# Patient Record
Sex: Male | Born: 1970 | Race: White | Hispanic: No | Smoking: Former smoker
Health system: Southern US, Community
[De-identification: ages and names within clinical notes are randomized; demographics above are authoritative.]

## PROBLEM LIST (undated history)

## (undated) DIAGNOSIS — T8859XA Other complications of anesthesia, initial encounter: Secondary | ICD-10-CM

## (undated) DIAGNOSIS — K219 Gastro-esophageal reflux disease without esophagitis: Secondary | ICD-10-CM

## (undated) DIAGNOSIS — R198 Other specified symptoms and signs involving the digestive system and abdomen: Secondary | ICD-10-CM

## (undated) DIAGNOSIS — J029 Acute pharyngitis, unspecified: Secondary | ICD-10-CM

## (undated) DIAGNOSIS — K8020 Calculus of gallbladder without cholecystitis without obstruction: Secondary | ICD-10-CM

## (undated) DIAGNOSIS — K760 Fatty (change of) liver, not elsewhere classified: Secondary | ICD-10-CM

## (undated) DIAGNOSIS — J209 Acute bronchitis, unspecified: Secondary | ICD-10-CM

## (undated) DIAGNOSIS — K8000 Calculus of gallbladder with acute cholecystitis without obstruction: Secondary | ICD-10-CM

## (undated) DIAGNOSIS — M25561 Pain in right knee: Secondary | ICD-10-CM

## (undated) DIAGNOSIS — D229 Melanocytic nevi, unspecified: Secondary | ICD-10-CM

## (undated) DIAGNOSIS — I10 Essential (primary) hypertension: Secondary | ICD-10-CM

## (undated) DIAGNOSIS — R079 Chest pain, unspecified: Secondary | ICD-10-CM

## (undated) DIAGNOSIS — L989 Disorder of the skin and subcutaneous tissue, unspecified: Secondary | ICD-10-CM

## (undated) DIAGNOSIS — Z86010 Personal history of colonic polyps: Secondary | ICD-10-CM

## (undated) DIAGNOSIS — J42 Unspecified chronic bronchitis: Secondary | ICD-10-CM

## (undated) DIAGNOSIS — T4145XA Adverse effect of unspecified anesthetic, initial encounter: Secondary | ICD-10-CM

## (undated) DIAGNOSIS — R109 Unspecified abdominal pain: Secondary | ICD-10-CM

## (undated) DIAGNOSIS — R933 Abnormal findings on diagnostic imaging of other parts of digestive tract: Secondary | ICD-10-CM

## (undated) DIAGNOSIS — J45909 Unspecified asthma, uncomplicated: Secondary | ICD-10-CM

## (undated) DIAGNOSIS — S335XXA Sprain of ligaments of lumbar spine, initial encounter: Secondary | ICD-10-CM

## (undated) DIAGNOSIS — H109 Unspecified conjunctivitis: Secondary | ICD-10-CM

## (undated) DIAGNOSIS — E669 Obesity, unspecified: Secondary | ICD-10-CM

## (undated) DIAGNOSIS — K5289 Other specified noninfective gastroenteritis and colitis: Secondary | ICD-10-CM

## (undated) DIAGNOSIS — G4733 Obstructive sleep apnea (adult) (pediatric): Secondary | ICD-10-CM

## (undated) DIAGNOSIS — R112 Nausea with vomiting, unspecified: Secondary | ICD-10-CM

## (undated) DIAGNOSIS — N529 Male erectile dysfunction, unspecified: Secondary | ICD-10-CM

## (undated) DIAGNOSIS — E860 Dehydration: Secondary | ICD-10-CM

## (undated) DIAGNOSIS — A084 Viral intestinal infection, unspecified: Secondary | ICD-10-CM

## (undated) DIAGNOSIS — K529 Noninfective gastroenteritis and colitis, unspecified: Secondary | ICD-10-CM

## (undated) DIAGNOSIS — D124 Benign neoplasm of descending colon: Secondary | ICD-10-CM

## (undated) DIAGNOSIS — F911 Conduct disorder, childhood-onset type: Secondary | ICD-10-CM

## (undated) DIAGNOSIS — J40 Bronchitis, not specified as acute or chronic: Secondary | ICD-10-CM

## (undated) DIAGNOSIS — F411 Generalized anxiety disorder: Secondary | ICD-10-CM

## (undated) DIAGNOSIS — K56600 Partial intestinal obstruction, unspecified as to cause: Secondary | ICD-10-CM

## (undated) DIAGNOSIS — R609 Edema, unspecified: Secondary | ICD-10-CM

## (undated) DIAGNOSIS — G4730 Sleep apnea, unspecified: Secondary | ICD-10-CM

## (undated) DIAGNOSIS — R0789 Other chest pain: Secondary | ICD-10-CM

## (undated) DIAGNOSIS — H1033 Unspecified acute conjunctivitis, bilateral: Secondary | ICD-10-CM

## (undated) DIAGNOSIS — N179 Acute kidney failure, unspecified: Secondary | ICD-10-CM

## (undated) DIAGNOSIS — F17200 Nicotine dependence, unspecified, uncomplicated: Secondary | ICD-10-CM

## (undated) DIAGNOSIS — F909 Attention-deficit hyperactivity disorder, unspecified type: Secondary | ICD-10-CM

## (undated) DIAGNOSIS — K56609 Unspecified intestinal obstruction, unspecified as to partial versus complete obstruction: Secondary | ICD-10-CM

## (undated) DIAGNOSIS — L918 Other hypertrophic disorders of the skin: Secondary | ICD-10-CM

## (undated) DIAGNOSIS — R197 Diarrhea, unspecified: Secondary | ICD-10-CM

## (undated) HISTORY — PX: FINGER SURGERY: SHX640

## (undated) HISTORY — DX: Nicotine dependence, unspecified, uncomplicated: F17.200

## (undated) HISTORY — DX: Attention-deficit hyperactivity disorder, unspecified type: F90.9

## (undated) HISTORY — DX: Nausea with vomiting, unspecified: R11.2

## (undated) HISTORY — DX: Other chest pain: R07.89

## (undated) HISTORY — DX: Unspecified acute conjunctivitis, bilateral: H10.33

## (undated) HISTORY — DX: Viral intestinal infection, unspecified: A08.4

## (undated) HISTORY — DX: Essential (primary) hypertension: I10

## (undated) HISTORY — DX: Dehydration: E86.0

## (undated) HISTORY — DX: Acute kidney failure, unspecified: N17.9

## (undated) HISTORY — DX: Pain in right knee: M25.561

## (undated) HISTORY — DX: Abnormal findings on diagnostic imaging of other parts of digestive tract: R93.3

## (undated) HISTORY — DX: Melanocytic nevi, unspecified: D22.9

## (undated) HISTORY — DX: Personal history of colonic polyps: Z86.010

## (undated) HISTORY — DX: Gastro-esophageal reflux disease without esophagitis: K21.9

## (undated) HISTORY — DX: Other hypertrophic disorders of the skin: L91.8

## (undated) HISTORY — DX: Calculus of gallbladder without cholecystitis without obstruction: K80.20

## (undated) HISTORY — DX: Conduct disorder, childhood-onset type: F91.1

## (undated) HISTORY — DX: Fatty (change of) liver, not elsewhere classified: K76.0

## (undated) HISTORY — DX: Obstructive sleep apnea (adult) (pediatric): G47.33

## (undated) HISTORY — DX: Partial intestinal obstruction, unspecified as to cause: K56.600

## (undated) HISTORY — DX: Chest pain, unspecified: R07.9

## (undated) HISTORY — DX: Unspecified conjunctivitis: H10.9

## (undated) HISTORY — DX: Unspecified intestinal obstruction, unspecified as to partial versus complete obstruction: K56.609

## (undated) HISTORY — DX: Other specified noninfective gastroenteritis and colitis: K52.89

## (undated) HISTORY — DX: Acute bronchitis, unspecified: J20.9

## (undated) HISTORY — DX: Benign neoplasm of descending colon: D12.4

## (undated) HISTORY — DX: Acute pharyngitis, unspecified: J02.9

## (undated) HISTORY — DX: Unspecified abdominal pain: R10.9

## (undated) HISTORY — DX: Morbid (severe) obesity due to excess calories: E66.01

## (undated) HISTORY — DX: Diarrhea, unspecified: R19.7

## (undated) HISTORY — DX: Sprain of ligaments of lumbar spine, initial encounter: S33.5XXA

## (undated) HISTORY — DX: Edema, unspecified: R60.9

## (undated) HISTORY — DX: Male erectile dysfunction, unspecified: N52.9

## (undated) HISTORY — DX: Generalized anxiety disorder: F41.1

## (undated) HISTORY — DX: Calculus of gallbladder with acute cholecystitis without obstruction: K80.00

## (undated) HISTORY — DX: Disorder of the skin and subcutaneous tissue, unspecified: L98.9

## (undated) HISTORY — DX: Bronchitis, not specified as acute or chronic: J40

## (undated) HISTORY — DX: Noninfective gastroenteritis and colitis, unspecified: K52.9

## (undated) HISTORY — DX: Other specified symptoms and signs involving the digestive system and abdomen: R19.8

---

## 1998-05-28 ENCOUNTER — Encounter: Payer: Self-pay | Admitting: Emergency Medicine

## 1998-05-28 ENCOUNTER — Emergency Department (HOSPITAL_COMMUNITY): Admission: EM | Admit: 1998-05-28 | Discharge: 1998-05-28 | Payer: Self-pay | Admitting: Emergency Medicine

## 1998-07-13 ENCOUNTER — Emergency Department (HOSPITAL_COMMUNITY): Admission: EM | Admit: 1998-07-13 | Discharge: 1998-07-13 | Payer: Self-pay | Admitting: Emergency Medicine

## 2004-01-31 ENCOUNTER — Ambulatory Visit: Payer: Self-pay | Admitting: Family Medicine

## 2004-03-02 ENCOUNTER — Ambulatory Visit: Payer: Self-pay | Admitting: Family Medicine

## 2004-06-12 ENCOUNTER — Ambulatory Visit: Payer: Self-pay | Admitting: Family Medicine

## 2004-07-21 ENCOUNTER — Ambulatory Visit: Payer: Self-pay | Admitting: Internal Medicine

## 2004-12-28 ENCOUNTER — Ambulatory Visit: Payer: Self-pay | Admitting: Family Medicine

## 2005-08-31 ENCOUNTER — Ambulatory Visit: Payer: Self-pay | Admitting: Family Medicine

## 2005-09-19 ENCOUNTER — Ambulatory Visit: Payer: Self-pay | Admitting: Gastroenterology

## 2005-10-03 DIAGNOSIS — K649 Unspecified hemorrhoids: Secondary | ICD-10-CM

## 2005-10-16 ENCOUNTER — Encounter (INDEPENDENT_AMBULATORY_CARE_PROVIDER_SITE_OTHER): Payer: Self-pay | Admitting: Specialist

## 2005-10-16 ENCOUNTER — Ambulatory Visit: Payer: Self-pay | Admitting: Gastroenterology

## 2005-10-16 HISTORY — PX: COLONOSCOPY: SHX174

## 2005-12-12 ENCOUNTER — Ambulatory Visit: Payer: Self-pay | Admitting: Family Medicine

## 2005-12-17 ENCOUNTER — Ambulatory Visit: Payer: Self-pay | Admitting: Family Medicine

## 2006-02-15 ENCOUNTER — Ambulatory Visit: Payer: Self-pay | Admitting: Family Medicine

## 2006-03-18 ENCOUNTER — Ambulatory Visit: Payer: Self-pay | Admitting: Family Medicine

## 2006-03-18 LAB — CONVERTED CEMR LAB
BUN: 13 mg/dL (ref 6–23)
CO2: 30 meq/L (ref 19–32)
Calcium: 9.5 mg/dL (ref 8.4–10.5)
Chloride: 106 meq/L (ref 96–112)
Chol/HDL Ratio, serum: 3.6
Cholesterol: 169 mg/dL (ref 0–200)
Creatinine, Ser: 1 mg/dL (ref 0.4–1.5)
GFR calc non Af Amer: 91 mL/min
Glomerular Filtration Rate, Af Am: 110 mL/min/{1.73_m2}
Glucose, Bld: 83 mg/dL (ref 70–99)
HDL: 46.8 mg/dL (ref >39.0)
LDL Cholesterol: 105 mg/dL — ABNORMAL HIGH (ref 0–99)
Potassium: 4 meq/L (ref 3.5–5.1)
Sodium: 141 meq/L (ref 135–145)
Triglyceride fasting, serum: 87 mg/dL (ref 0–149)
VLDL: 17 mg/dL (ref 0–40)

## 2006-04-29 ENCOUNTER — Ambulatory Visit: Payer: Self-pay | Admitting: Family Medicine

## 2006-05-01 ENCOUNTER — Ambulatory Visit: Payer: Self-pay | Admitting: Family Medicine

## 2006-07-30 ENCOUNTER — Ambulatory Visit: Payer: Self-pay | Admitting: Family Medicine

## 2006-07-30 DIAGNOSIS — G4733 Obstructive sleep apnea (adult) (pediatric): Secondary | ICD-10-CM | POA: Insufficient documentation

## 2006-07-30 DIAGNOSIS — I10 Essential (primary) hypertension: Secondary | ICD-10-CM

## 2006-07-30 HISTORY — DX: Obstructive sleep apnea (adult) (pediatric): G47.33

## 2006-07-30 HISTORY — DX: Morbid (severe) obesity due to excess calories: E66.01

## 2006-08-30 ENCOUNTER — Ambulatory Visit: Payer: Self-pay | Admitting: Pulmonary Disease

## 2006-10-02 ENCOUNTER — Ambulatory Visit (HOSPITAL_BASED_OUTPATIENT_CLINIC_OR_DEPARTMENT_OTHER): Admission: RE | Admit: 2006-10-02 | Discharge: 2006-10-02 | Payer: Self-pay | Admitting: Pulmonary Disease

## 2006-10-16 ENCOUNTER — Ambulatory Visit: Payer: Self-pay | Admitting: Pulmonary Disease

## 2006-10-21 ENCOUNTER — Encounter: Payer: Self-pay | Admitting: Internal Medicine

## 2006-10-21 DIAGNOSIS — K219 Gastro-esophageal reflux disease without esophagitis: Secondary | ICD-10-CM

## 2006-10-21 DIAGNOSIS — D124 Benign neoplasm of descending colon: Secondary | ICD-10-CM

## 2006-10-21 DIAGNOSIS — F909 Attention-deficit hyperactivity disorder, unspecified type: Secondary | ICD-10-CM

## 2006-10-21 HISTORY — DX: Gastro-esophageal reflux disease without esophagitis: K21.9

## 2006-10-21 HISTORY — DX: Benign neoplasm of descending colon: D12.4

## 2006-10-21 HISTORY — DX: Attention-deficit hyperactivity disorder, unspecified type: F90.9

## 2006-10-31 ENCOUNTER — Ambulatory Visit: Payer: Self-pay | Admitting: Family Medicine

## 2006-10-31 DIAGNOSIS — F911 Conduct disorder, childhood-onset type: Secondary | ICD-10-CM

## 2006-10-31 HISTORY — DX: Conduct disorder, childhood-onset type: F91.1

## 2006-12-30 ENCOUNTER — Telehealth (INDEPENDENT_AMBULATORY_CARE_PROVIDER_SITE_OTHER): Payer: Self-pay | Admitting: *Deleted

## 2007-01-20 ENCOUNTER — Ambulatory Visit: Payer: Self-pay | Admitting: Family Medicine

## 2007-01-21 ENCOUNTER — Encounter (INDEPENDENT_AMBULATORY_CARE_PROVIDER_SITE_OTHER): Payer: Self-pay | Admitting: Internal Medicine

## 2007-01-21 ENCOUNTER — Ambulatory Visit: Payer: Self-pay | Admitting: Family Medicine

## 2007-01-31 ENCOUNTER — Emergency Department (HOSPITAL_COMMUNITY): Admission: EM | Admit: 2007-01-31 | Discharge: 2007-01-31 | Payer: Self-pay | Admitting: Emergency Medicine

## 2007-08-01 ENCOUNTER — Ambulatory Visit: Payer: Self-pay | Admitting: Family Medicine

## 2008-01-09 ENCOUNTER — Emergency Department (HOSPITAL_COMMUNITY): Admission: EM | Admit: 2008-01-09 | Discharge: 2008-01-09 | Payer: Self-pay | Admitting: Emergency Medicine

## 2008-03-31 ENCOUNTER — Ambulatory Visit: Payer: Self-pay | Admitting: Internal Medicine

## 2008-04-14 ENCOUNTER — Encounter (INDEPENDENT_AMBULATORY_CARE_PROVIDER_SITE_OTHER): Payer: Self-pay | Admitting: Internal Medicine

## 2008-05-18 ENCOUNTER — Emergency Department (HOSPITAL_COMMUNITY): Admission: EM | Admit: 2008-05-18 | Discharge: 2008-05-18 | Payer: Self-pay | Admitting: Emergency Medicine

## 2008-09-28 ENCOUNTER — Ambulatory Visit: Payer: Self-pay | Admitting: Family Medicine

## 2008-11-09 ENCOUNTER — Ambulatory Visit: Payer: Self-pay | Admitting: Family Medicine

## 2008-11-09 DIAGNOSIS — R609 Edema, unspecified: Secondary | ICD-10-CM

## 2008-11-09 HISTORY — DX: Edema, unspecified: R60.9

## 2009-01-12 ENCOUNTER — Ambulatory Visit: Payer: Self-pay | Admitting: Family Medicine

## 2009-02-09 ENCOUNTER — Ambulatory Visit: Payer: Self-pay | Admitting: Family Medicine

## 2009-03-16 ENCOUNTER — Encounter (INDEPENDENT_AMBULATORY_CARE_PROVIDER_SITE_OTHER): Payer: Self-pay | Admitting: Internal Medicine

## 2009-06-15 ENCOUNTER — Ambulatory Visit: Payer: Self-pay | Admitting: Family Medicine

## 2009-11-14 ENCOUNTER — Ambulatory Visit: Payer: Self-pay | Admitting: Family Medicine

## 2009-11-14 DIAGNOSIS — F411 Generalized anxiety disorder: Secondary | ICD-10-CM

## 2009-11-14 HISTORY — DX: Generalized anxiety disorder: F41.1

## 2009-12-15 ENCOUNTER — Ambulatory Visit: Payer: Self-pay | Admitting: Family Medicine

## 2010-01-02 ENCOUNTER — Emergency Department (HOSPITAL_COMMUNITY): Admission: EM | Admit: 2010-01-02 | Discharge: 2010-01-02 | Payer: Self-pay | Admitting: Emergency Medicine

## 2010-01-12 ENCOUNTER — Ambulatory Visit: Payer: Self-pay | Admitting: Family Medicine

## 2010-04-06 NOTE — Assessment & Plan Note (Signed)
Summary: 1 m f/u dlo   Vital Signs:  Patient profile:   40 year old male Height:      74 inches Weight:      412 pounds BMI:     53.09 Temp:     97.9 degrees F oral Pulse rate:   72 / minute Pulse rhythm:   regular BP sitting:   132 / 86  (left arm) Cuff size:   large  Vitals Entered By: Linde Gillis CMA Duncan Dull) (December 15, 2009 8:17 AM) CC: one month follow up   History of Present Illness: 40 yo here for follow up anxiety.  Last month, started Buspirone 15 mg daily and Alprazolam 0.25 mg as needed. Not taking the Alprazolam, made him too sleepy.  Does feel the Buspirone has taken the edge off.  Temper is a little better. Does not feel like his BP is climbing anymore when he is at work.  Felt it worked better at the beginning, not quite as effective now.    Irritation on his glans penis- pain after intercourse, some redness on glans of penis.  Sexually active with wife only.  No penile discharge or urinary complaints.  Never had anything like this before.   Current Medications (verified): 1)  Omeprazole 20 Mg Cpdr (Omeprazole) .... Otc One Daily 2)  Norvasc 5 Mg Tabs (Amlodipine Besylate) .... Take 1 Once Daily For Bp 3)  Hydrochlorothiazide 25 Mg Tabs (Hydrochlorothiazide) .... Take 1 Each Morning For Bp 4)  Buspirone Hcl 30 Mg Tabs (Buspirone Hcl) .Marland Kitchen.. 1 Tab By Mouth Daily. 5)  Alprazolam 0.25 Mg Tabs (Alprazolam) .Marland Kitchen.. 1 Tab By Mouth Three Times A Day As Needed Anxiety 6)  Clotrimazole 1 % Crea (Clotrimazole) .... Apply To Area Two Times A Day X 3 Weeks.  Allergies: 1)  * Penicillin 2)  * Codeine 3)  * Insect Stings  Past History:  Past Medical History: Last updated: 10/21/2006 GERD Hypertension  Past Surgical History: Last updated: 10/21/2006 colonoscopy-- polyps, ext hemms.-- 10/16/2005  Family History: Last updated: 03/31/2008 Father:  Mother:  Siblings:    DM- MI- CVA-  Prostate Cancer- Breast Cancer- Ovarian Cancer- Uterine Cancer- Colon  Cancer- Drug/ ETOH Abuse- Depression-   Social History: Last updated: 03/31/2008 Marital Status: Married Children: 1 Occupation:   Risk Factors: Smoking Status: current (10/21/2006)  Review of Systems      See HPI General:  Denies fever. GU:  Denies discharge, dysuria, urinary frequency, and urinary hesitancy. Psych:  Complains of easily angered; denies easily tearful, panic attacks, sense of great danger, suicidal thoughts/plans, thoughts of violence, unusual visions or sounds, and thoughts /plans of harming others.  Physical Exam  General:  alert, well-developed, well-nourished, and well-hydrated.  morbidly obese, NAD Normotensive Lungs:  normal respiratory effort, no intercostal retractions, no accessory muscle use, and normal breath sounds.   Heart:  distant heart sounds, normal rate, regular rhythm, and no murmur Genitalia:  circumcised and no scrotal masses.   area of erythema and scaling around base of glans.  No penile discharge of lesions.   Psych:  normally interactive and good eye contact, less anxious today.   Impression & Recommendations:  Problem # 1:  ANXIETY (ICD-300.00) Assessment Improved Will increase dose of Buspirone to 30 mg as he did have some improvement at lower dose.  Follow up as needed. His updated medication list for this problem includes:    Buspirone Hcl 30 Mg Tabs (Buspirone hcl) .Marland Kitchen... 1 tab by mouth daily.  Alprazolam 0.25 Mg Tabs (Alprazolam) .Marland Kitchen... 1 tab by mouth three times a day as needed anxiety  Problem # 2:  BALANITIS (ICD-607.1) Assessment: New Will treat with clotrimazole x 3 weeks.  Pt to follow up if no improvement after that 3 week period.  Discuss hygeine as well.  Complete Medication List: 1)  Omeprazole 20 Mg Cpdr (Omeprazole) .... Otc one daily 2)  Norvasc 5 Mg Tabs (Amlodipine besylate) .... Take 1 once daily for bp 3)  Hydrochlorothiazide 25 Mg Tabs (Hydrochlorothiazide) .... Take 1 each morning for bp 4)  Buspirone  Hcl 30 Mg Tabs (Buspirone hcl) .Marland Kitchen.. 1 tab by mouth daily. 5)  Alprazolam 0.25 Mg Tabs (Alprazolam) .Marland Kitchen.. 1 tab by mouth three times a day as needed anxiety 6)  Clotrimazole 1 % Crea (Clotrimazole) .... Apply to area two times a day x 3 weeks. Prescriptions: CLOTRIMAZOLE 1 % CREA (CLOTRIMAZOLE) apply to area two times a day x 3 weeks.  #30 g x 0   Entered and Authorized by:   Ruthe Mannan MD   Signed by:   Ruthe Mannan MD on 12/15/2009   Method used:   Electronically to        [facility] (retail)       [address]       [place], [place]  [address]       Ph: [phone]       Fax: [phone]   RxID:   [id number] BUSPIRONE HCL 30 MG TABS (BUSPIRONE HCL) 1 tab by mouth daily.  #30 x 11   Entered and Authorized by:   Ruthe Mannan MD   Signed by:   Ruthe Mannan MD on 12/15/2009   Method used:   Electronically to        [facility] (retail)       [address]       [place], [place]  [address]       Ph: [phone]       Fax: [phone]   RxID:   [id number]   Current Allergies (reviewed today): * PENICILLIN * CODEINE * INSECT STINGS

## 2010-04-06 NOTE — Letter (Signed)
Summary: Generic Letter  [place] at [facility]  [address] [place], [place] [address]   Phone: [phone]  Fax: [phone]      03/16/2009      [facility], [place] [address] [place], [place] [address]    ROOK MAUE     [address]     [place], [place]  [address]  Dear [name] [name]:  [name] [name] has been a patient at [facility] for a number of years.  Recently he has discussed with me his desire to loose weight.  He has tried short term to increase exercise and restrict caloric intake.  He has lost some weight, but regained that lost and gained more with Holidays, loss of job or family stresses.  Since becoming a father, he has verbalized the desire for weight loss, "I'm tired of being fat." He understands his increased risks due to his obesity a nd hypertension. However he has not been able to mount a continuing program.  He and his wife lack basic nutritional education and eat an "old fashioned" Southern diet or fast food. They both want to please and are anxious to learn.  I have completed the form with his weights.  If I may be of further assistance, please call.           Sincerely,   [name] Latoya Maulding FNP

## 2010-04-06 NOTE — Letter (Signed)
Summary: Generic Letter  [place] at [facility]  [address] [place], [place] [address]   Phone: [phone]  Fax: [phone]        03/16/2009   [facility] [address] [place], [place]  [address]     [name]     [address]     [place], [place]  [address]   Dear Milford Cage:  [name] [name] has been a patient in the [facility] for many years.  He has expressed the desire for bariatric surgery recently due to his increasing weight.  He is aware of his increased risks due to his hypertension and morbid obesity.  He has tried short term to increase exercise and restrict calories.  He has not been able to continue this due to many factors, as family stresses.  Since the birth of his daughter, he has verbalized the need to do something more than ever before.   Your form of previous weights has been completed and is attached.  If you need more information, please contact us.            Sincerely,   [name] FNP

## 2010-04-06 NOTE — Assessment & Plan Note (Signed)
Summary: BP IS ELEVATED/ 11:15   Vital Signs:  Patient profile:   40 year old male Height:      74 inches Weight:      412 pounds BMI:     53.09 Temp:     97.7 degrees F oral Pulse rate:   68 / minute Pulse rhythm:   regular BP sitting:   128 / 100  (left arm) Cuff size:   large  Vitals Entered By: Linde Gillis CMA Duncan Dull) (November 14, 2009 10:40 AM)  Serial Vital Signs/Assessments:  Time      Position  BP       Pulse  Resp  Temp     By                     126/90                         Ruthe Mannan MD  CC: elevated blood pressure   History of Present Illness: 40 yo here for elevated BP.  Was at work today, started thinking about all the stressors in his life, job, wife, bills, etc, felt a little sweaty and checked BP at work was 160/110.  No chest pain or SOB.  No nausea or vomiting. On HCTZ 25 mg daily and Norvasc 5 mg daily.  No palpitations or blurred vision.  Has never had anything like this before. No history of CAD.   Current Medications (verified): 1)  Omeprazole 20 Mg Cpdr (Omeprazole) .... Otc One Daily 2)  Norvasc 5 Mg Tabs (Amlodipine Besylate) .... Take 1 Once Daily For Bp 3)  Hydrochlorothiazide 25 Mg Tabs (Hydrochlorothiazide) .... Take 1 Each Morning For Bp 4)  Buspirone Hcl 15 Mg Tabs (Buspirone Hcl) .Marland Kitchen.. 1 Tab By Mouth Daily. 5)  Alprazolam 0.25 Mg Tabs (Alprazolam) .Marland Kitchen.. 1 Tab By Mouth Three Times A Day As Needed Anxiety  Allergies: 1)  * Penicillin 2)  * Codeine 3)  * Insect Stings  Past History:  Past Medical History: Last updated: 10/21/2006 GERD Hypertension  Past Surgical History: Last updated: 10/21/2006 colonoscopy-- polyps, ext hemms.-- 10/16/2005  Family History: Last updated: 03/31/2008 Father:  Mother:  Siblings:    DM- MI- CVA-  Prostate Cancer- Breast Cancer- Ovarian Cancer- Uterine Cancer- Colon Cancer- Drug/ ETOH Abuse- Depression-   Social History: Last updated: 03/31/2008 Marital Status: Married Children:  1 Occupation:   Risk Factors: Smoking Status: current (10/21/2006)  Review of Systems General:  Denies malaise. Eyes:  Denies blurring. CV:  Denies chest pain or discomfort. Resp:  Denies shortness of breath. Neuro:  Denies tremors, visual disturbances, and weakness. Psych:  Complains of anxiety and panic attacks; denies depression, easily angered, easily tearful, irritability, mental problems, suicidal thoughts/plans, thoughts of violence, unusual visions or sounds, and thoughts /plans of harming others.  Physical Exam  General:  alert, well-developed, well-nourished, and well-hydrated.  morbidly obese, NAD Normotensive Lungs:  normal respiratory effort, no intercostal retractions, no accessory muscle use, and normal breath sounds.   Heart:  distant heart sounds, normal rate, regular rhythm, and no murmur Psych:  normally interactive and good eye contact, moderately anxious   Impression & Recommendations:  Problem # 1:  HYPERTENSION, BENIGN ESSENTIAL (ICD-401.1) Assessment Unchanged Normotensive in office. EKG within normal limits. Most likely due to panic attack with worsening anxiety.  See below. His updated medication list for this problem includes:    Norvasc 5 Mg Tabs (Amlodipine besylate) .Marland Kitchen... Take 1  once daily for bp    Hydrochlorothiazide 25 Mg Tabs (Hydrochlorothiazide) .Marland Kitchen... Take 1 each morning for bp  Orders: EKG w/ Interpretation ([address])  Problem # 2:  ANXIETY (ICD-300.00) Assessment: New Will start Busprione 15 mg daily. Given prescription for Alprazolam to take as needed until Busprione takes effect.  Pt aware of addiction and sedating potential. His updated medication list for this problem includes:    Buspirone Hcl 15 Mg Tabs (Buspirone hcl) .Marland Kitchen... 1 tab by mouth daily.    Alprazolam 0.25 Mg Tabs (Alprazolam) .Marland Kitchen... 1 tab by mouth three times a day as needed anxiety  Complete Medication List: 1)  Omeprazole 20 Mg Cpdr (Omeprazole) .... Otc one daily 2)   Norvasc 5 Mg Tabs (Amlodipine besylate) .... Take 1 once daily for bp 3)  Hydrochlorothiazide 25 Mg Tabs (Hydrochlorothiazide) .... Take 1 each morning for bp 4)  Buspirone Hcl 15 Mg Tabs (Buspirone hcl) .Marland Kitchen.. 1 tab by mouth daily. 5)  Alprazolam 0.25 Mg Tabs (Alprazolam) .Marland Kitchen.. 1 tab by mouth three times a day as needed anxiety  Patient Instructions: 1)  Please come back to see me in one month. 2)  Come to see [place] or go straight to ER if you feel like your blood pressure is elevated again. Prescriptions: ALPRAZOLAM 0.25 MG TABS (ALPRAZOLAM) 1 tab by mouth three times a day as needed anxiety  #60 x 0   Entered and Authorized by:   Ruthe Mannan MD   Signed by:   Ruthe Mannan MD on 11/14/2009   Method used:   Print then Give to Patient   RxID:   [id number] BUSPIRONE HCL 15 MG TABS (BUSPIRONE HCL) 1 tab by mouth daily.  #30 x 0   Entered and Authorized by:   Ruthe Mannan MD   Signed by:   Ruthe Mannan MD on 11/14/2009   Method used:   Electronically to        [facility] (retail)       [address]       [place], [place]  [address]       Ph: [phone]       Fax: [phone]   RxID:   [id number]   Current Allergies (reviewed today): * PENICILLIN * CODEINE * INSECT STINGS

## 2010-04-06 NOTE — Assessment & Plan Note (Signed)
Summary: 4 MONTH FOLLOW UP/RBH   Vital Signs:  Patient profile:   40 year old male Height:      74 inches Weight:      413.13 pounds BMI:     53.23 Temp:     97.9 degrees F oral Pulse rate:   68 / minute Pulse rhythm:   regular BP sitting:   140 / 88  (left arm) Cuff size:   large  Vitals Entered By: Linde Gillis CMA Duncan Dull) (June 15, 2009 12:01 PM) CC: 4 month follow up   History of Present Illness: 40 yo male new to me here for follow up weight, HTN.  Obesity- weight is up 5 pounds since he was here in December.  He knows that he needs to lose weight.  Not at his heaviest yet, weighed 455 several years ago.  Lost a lot of weight after his fiance left him.  Now is married to another woman and has an infant girl at home.  Very motivated to lose weight so he can be around for his child.  Has gone to his first presurgical visit for gastric bypass.  Wants to know if he should have lap band or gastric bypass.  He has a few more papers he has to fill out before they will schedule a surgery.  HTN- mildly elevated today but says that he has not taken his BP meds today and that he just ate a BBQ chicken sandwhich.  At home and work, BPs range in 130s/80s.  He is not a diabetic.  Current Medications (verified): 1)  Omeprazole 20 Mg Cpdr (Omeprazole) .... Otc One Daily 2)  Norvasc 5 Mg Tabs (Amlodipine Besylate) .... Take 1 Once Daily For Bp 3)  Hydrochlorothiazide 25 Mg Tabs (Hydrochlorothiazide) .... Take 1 Each Morning For Bp  Allergies: 1)  * Penicillin 2)  * Codeine 3)  * Insect Stings  Past History:  Past Medical History: Last updated: 10/21/2006 GERD Hypertension  Past Surgical History: Last updated: 10/21/2006 colonoscopy-- polyps, ext hemms.-- 10/16/2005  Review of Systems      See HPI CV:  Denies chest pain or discomfort. GI:  Denies abdominal pain and change in bowel habits. Psych:  Denies anxiety and depression.  Physical Exam  General:  alert, well-developed,  well-nourished, and well-hydrated.  morbidly obese, NAD Lungs:  normal respiratory effort, no intercostal retractions, no accessory muscle use, and normal breath sounds.   Heart:  distant heart sounds, normal rate, regular rhythm, and no murmur Psych:  normally interactive and good eye contact.     Impression & Recommendations:  Problem # 1:  MORBID OBESITY (ICD-278.01) Assessment Deteriorated Time spent with patient 25 minutes, more than 50% of this time was spent discussing bariatric surgery.  He feels he wants to have gastric bypass, not lapband because he needs a "cheat proof" surgery.  He will send [place] more paperwork if necessary to get the process going.  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  Problem # 2:  HYPERTENSION, BENIGN ESSENTIAL (ICD-401.1) Assessment: Deteriorated Has not taken meds today.  Asked him to keep log at home and call me in a few weeks. His updated medication list for this problem includes:    Norvasc 5 Mg Tabs (Amlodipine besylate) .Marland Kitchen... Take 1 once daily for bp    Hydrochlorothiazide 25 Mg Tabs (Hydrochlorothiazide) .Marland Kitchen... Take 1 each morning for bp  Complete Medication List: 1)  Omeprazole 20 Mg Cpdr (Omeprazole) .... Otc one daily 2)  Norvasc 5 Mg Tabs (Amlodipine besylate) .... Take 1 once daily for bp 3)  Hydrochlorothiazide 25 Mg Tabs (Hydrochlorothiazide) .... Take 1 each morning for bp  Current Allergies (reviewed today): * PENICILLIN * CODEINE * INSECT STINGS

## 2010-04-06 NOTE — Letter (Signed)
Summary: Generic Letter  [place] at [facility]  [address] [place], [place] [address]   Phone: [phone]  Fax: [phone]    03/16/2009  [name] [name] [address] [place], [place]  [address]  Dear Mr. LUA,           Sincerely,   Vic Ripper FNP

## 2010-04-06 NOTE — Letter (Signed)
Summary: [facility] Surgery Letter of Medical Necessity  [facility] Surgery Letter of Medical Necessity   Imported By: Beau Fanny 03/16/2009 14:16:58  _____________________________________________________________________  External Attachment:    Type:   Image     Comment:   External Document
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
## 2010-04-24 ENCOUNTER — Ambulatory Visit (INDEPENDENT_AMBULATORY_CARE_PROVIDER_SITE_OTHER): Payer: BC Managed Care – PPO | Admitting: Family Medicine

## 2010-04-24 ENCOUNTER — Encounter: Payer: Self-pay | Admitting: Family Medicine

## 2010-05-02 NOTE — Letter (Signed)
Summary: Out of Work  [facility] [employer] [facility] at [facility]  [address] [place], [place] [address]   Phone: [phone]  Fax: [phone]    April 24, 2010   Employee:  DORA CLAUSS    To Whom It May Concern:   Mr. Dilone was seen in our office today and has been sick. He is cleared to return to work on April 26, 2010.  If you need additional information, please feel free to contact our office.         Sincerely,    Ruthe Mannan MD

## 2010-05-02 NOTE — Assessment & Plan Note (Signed)
Summary: FILL OUT FORM FOR WEIGHT LOSS,NOTE FOR WORK/CLE   [employer]   Vital Signs:  Patient profile:   40 year old male Height:      74 inches Weight:      429.25 pounds BMI:     55.31 Temp:     98.4 degrees F oral Pulse rate:   72 / minute Pulse rhythm:   regular BP sitting:   130 / 82  (left arm) Cuff size:   large  Vitals Entered By: Linde Gillis CMA Duncan Dull) (April 24, 2010 9:59 AM) CC: weight loss surgery   History of Present Illness: 40 yo here to fill out forms for bariatric surgery.  Has information with him from [facility] for Weight loss Surgery.  Obesity- weight is up another 5 pounds since he was las here.  He knows that he needs to lose weight.  Not at his heaviest yet, weighed 455 several years ago.  Lost a lot of weight after his fiance left him.  Now is married to another woman and has an infant girl at home.  Very motivated to lose weight so he can be around for his child.   Has tried several diets including slim fast, weight watchers, atkins, low calorie diet.  Admits to not being very physically active but feels he cannot do much until he loses a few pounds.  Has h/o HTN as well.  Was seen by nurse at work last week for sore throat, placed on Bactrim.  Feels better now.  Afebrile, no cough or SOB.  Current Medications (verified): 1)  Omeprazole 20 Mg Cpdr (Omeprazole) .... Otc One Daily 2)  Norvasc 5 Mg Tabs (Amlodipine Besylate) .... Take 1 Once Daily For Bp 3)  Hydrochlorothiazide 25 Mg Tabs (Hydrochlorothiazide) .... Take 1 Each Morning For Bp 4)  Buspirone Hcl 30 Mg Tabs (Buspirone Hcl) .Marland Kitchen.. 1 Tab By Mouth Daily. 5)  Alprazolam 0.25 Mg Tabs (Alprazolam) .Marland Kitchen.. 1 Tab By Mouth Three Times A Day As Needed Anxiety 6)  Clotrimazole 1 % Crea (Clotrimazole) .... Apply To Area Two Times A Day X 3 Weeks. 7)  Bactrim Ds 800-160 Mg Tabs (Sulfamethoxazole-Trimethoprim) .... Take One Tablet By Mouth Two Times A Day For Seven Days  Allergies: 1)  * Penicillin 2)  *  Codeine 3)  * Insect Stings  Past History:  Past Medical History: Last updated: 10/21/2006 GERD Hypertension  Past Surgical History: Last updated: 10/21/2006 colonoscopy-- polyps, ext hemms.-- 10/16/2005  Family History: Last updated: 03/31/2008 Father:  Mother:  Siblings:    DM- MI- CVA-  Prostate Cancer- Breast Cancer- Ovarian Cancer- Uterine Cancer- Colon Cancer- Drug/ ETOH Abuse- Depression-   Social History: Last updated: 03/31/2008 Marital Status: Married Children: 1 Occupation:   Risk Factors: Smoking Status: current (10/21/2006)  Review of Systems      See HPI General:  Denies fever. CV:  Denies chest pain or discomfort.  Physical Exam  General:  alert, well-developed, well-nourished, and well-hydrated.  morbidly obese, NAD  Nose:  External nasal examination shows no deformity or inflammation. Nasal mucosa are pink and moist without lesions or exudates. Mouth:  pharyngeal erythema.   Lungs:  normal respiratory effort, no intercostal retractions, no accessory muscle use, and normal breath sounds.   Heart:  distant heart sounds, normal rate, regular rhythm, and no murmur Psych:  normally interactive and good eye contact, less anxious today.   Impression & Recommendations:  Problem # 1:  MORBID OBESITY (ICD-278.01) Assessment Deteriorated patient would benefit from  weight loss surgery. information from [facility] for weight loss surgery reviewed. will send last six months of office visits, vital signs to them. discussed importance of limiting caloric intake, 1400-1600 cal per day.  Complete Medication List: 1)  Omeprazole 20 Mg Cpdr (Omeprazole) .... Otc one daily 2)  Norvasc 5 Mg Tabs (Amlodipine besylate) .... Take 1 once daily for bp 3)  Hydrochlorothiazide 25 Mg Tabs (Hydrochlorothiazide) .... Take 1 each morning for bp 4)  Buspirone Hcl 30 Mg Tabs (Buspirone hcl) .Marland Kitchen.. 1 tab by mouth daily. 5)  Alprazolam 0.25 Mg Tabs (Alprazolam) .Marland Kitchen.. 1  tab by mouth three times a day as needed anxiety 6)  Clotrimazole 1 % Crea (Clotrimazole) .... Apply to area two times a day x 3 weeks. 7)  Bactrim Ds 800-160 Mg Tabs (Sulfamethoxazole-trimethoprim) .... Take one tablet by mouth two times a day for seven days   Orders Added: 1)  Est. Patient Level IV [04540]    Current Allergies (reviewed today): * PENICILLIN * CODEINE * INSECT STINGS

## 2010-05-15 ENCOUNTER — Encounter: Payer: Self-pay | Admitting: Family Medicine

## 2010-05-15 ENCOUNTER — Ambulatory Visit (INDEPENDENT_AMBULATORY_CARE_PROVIDER_SITE_OTHER): Payer: BC Managed Care – PPO | Admitting: Family Medicine

## 2010-05-15 DIAGNOSIS — K5289 Other specified noninfective gastroenteritis and colitis: Secondary | ICD-10-CM

## 2010-05-15 HISTORY — DX: Other specified noninfective gastroenteritis and colitis: K52.89

## 2010-05-23 NOTE — Letter (Signed)
Summary: Out of Work  [employer] [facility]  [address] [place], [place] [address]   Phone: [phone]  Fax: [phone]    May 15, 2010   Employee:  KIMO BANCROFT    To Whom It May Concern:   For Medical reasons, please excuse the above named employee from work for the following dates:  March 7th, March 12th, March 13th, 2012.  If you need additional information, please feel free to contact our office.         Sincerely,    Ruthe Mannan MD

## 2010-05-23 NOTE — Assessment & Plan Note (Signed)
Summary: F/U [facility] ER ON 05/10/10  STOMACH PAIN/CLE  [employer]   Vital Signs:  Patient profile:   40 year old male Height:      74 inches Weight:      423 pounds BMI:     54.51 Temp:     98.6 degrees F oral Pulse rate:   87 / minute Pulse rhythm:   regular BP sitting:   130 / 84  (right arm) Cuff size:   large  Vitals Entered By: Linde Gillis CMA Duncan Dull) (May 15, 2010 3:10 PM) CC: ER follow up, [facility]   History of Present Illness: 40 yo here for [facility] ER follow up.  Was at work on Weds, March 7th, developped acute onset of diffuse abdominal pain, watery diarrhea and nausea.  Taken to [facility] via EMS. Notes reviewed.  CBC, CMET within normal limits. CT of abdomen neg for acute findings- gallstone.   UA neg.  GIven Zofran and Dilaudid! Sent home with diagnosis of gastroenteritis.  Since then, pain has improved. Nausea has resolved, still having some loose stools but much less frequently.  Drinking a lot of fluids. Afebrile.    Current Medications (verified): 1)  Omeprazole 20 Mg Cpdr (Omeprazole) .... Otc One Daily 2)  Norvasc 5 Mg Tabs (Amlodipine Besylate) .... Take 1 Once Daily For Bp 3)  Hydrochlorothiazide 25 Mg Tabs (Hydrochlorothiazide) .... Take 1 Each Morning For Bp 4)  Clotrimazole 1 % Crea (Clotrimazole) .... Apply To Area Two Times A Day X 3 Weeks.  Allergies: 1)  * Penicillin 2)  * Codeine 3)  * Insect Stings  Past History:  Past Medical History: Last updated: 10/21/2006 GERD Hypertension  Past Surgical History: Last updated: 10/21/2006 colonoscopy-- polyps, ext hemms.-- 10/16/2005  Family History: Last updated: 03/31/2008 Father:  Mother:  Siblings:    DM- MI- CVA-  Prostate Cancer- Breast Cancer- Ovarian Cancer- Uterine Cancer- Colon Cancer- Drug/ ETOH Abuse- Depression-   Social History: Last updated: 03/31/2008 Marital Status: Married Children: 1 Occupation:   Risk Factors: Smoking Status: current  (10/21/2006)  Review of Systems      See HPI General:  Denies fever. GI:  Complains of abdominal pain, diarrhea, and nausea; denies bloody stools, dark tarry stools, and vomiting. GU:  Denies dysuria.  Physical Exam  General:  alert, well-developed, well-nourished, and well-hydrated.  morbidly obese, NAD  Mouth:  MMM Lungs:  normal respiratory effort, no intercostal retractions, no accessory muscle use, and normal breath sounds.   Heart:  distant heart sounds, normal rate, regular rhythm, and no murmur Abdomen:  soft, non-tender, normal bowel sounds, no distention, no masses, no guarding, and no rigidity.   Psych:  normally interactive and good eye contact, less anxious today.   Impression & Recommendations:  Problem # 1:  GASTROENTERITIS WITHOUT DEHYDRATION (ICD-558.9) Assessment New Symptoms improving. Advised continuing to push fluids, continue advancing diet as tolerated. Zofran as needed if nausea returns. Pt to follow up if symptoms do not continue to improve or acutely worsen.  Complete Medication List: 1)  Omeprazole 20 Mg Cpdr (Omeprazole) .... Otc one daily 2)  Norvasc 5 Mg Tabs (Amlodipine besylate) .... Take 1 once daily for bp 3)  Hydrochlorothiazide 25 Mg Tabs (Hydrochlorothiazide) .... Take 1 each morning for bp 4)  Clotrimazole 1 % Crea (Clotrimazole) .... Apply to area two times a day x 3 weeks.  Patient Instructions: 1)  The main problem with gastroentereritis is dehydration. Drink plenty of fluids and take solids as  you feel better. If you are unable to keep anything down and/or you show signs of dehydration( dry cracked lips, lack of tears, not urinating, very sleepy) , call our office.    Orders Added: 1)  Est. Patient Level IV [84132]    Current Allergies (reviewed today): * PENICILLIN * CODEINE * INSECT STINGS

## 2010-05-30 ENCOUNTER — Emergency Department (HOSPITAL_COMMUNITY)
Admission: EM | Admit: 2010-05-30 | Discharge: 2010-05-31 | Disposition: A | Discharge status: Home / Self Care | Payer: BC Managed Care – PPO | Attending: Emergency Medicine | Admitting: Emergency Medicine

## 2010-05-30 DIAGNOSIS — J029 Acute pharyngitis, unspecified: Secondary | ICD-10-CM | POA: Insufficient documentation

## 2010-05-30 DIAGNOSIS — I10 Essential (primary) hypertension: Secondary | ICD-10-CM | POA: Insufficient documentation

## 2010-05-31 ENCOUNTER — Ambulatory Visit: Payer: BC Managed Care – PPO | Admitting: Family Medicine

## 2010-06-01 ENCOUNTER — Ambulatory Visit: Payer: BC Managed Care – PPO | Admitting: Family Medicine

## 2010-06-01 NOTE — Letter (Signed)
[facility]   Imported By: Kassie Mends 05/22/2010 10:21:27  _____________________________________________________________________  External Attachment:    Type:   Image     Comment:   External Document

## 2010-06-15 LAB — CBC
HCT: 44.9 % (ref 39.0–52.0)
Hemoglobin: 15.6 g/dL (ref 13.0–17.0)
MCV: 87.3 fL (ref 78.0–100.0)
Platelets: 153 10*3/uL (ref 150–400)
RBC: 5.14 MIL/uL (ref 4.22–5.81)
RDW: 12.5 % (ref 11.5–15.5)
WBC: 4.6 10*3/uL (ref 4.0–10.5)

## 2010-06-15 LAB — DIFFERENTIAL
Basophils Absolute: 0 10*3/uL (ref 0.0–0.1)
Basophils Relative: 0 % (ref 0–1)
Basophils Relative: 0 % (ref 0–1)
Eosinophils Absolute: 0 10*3/uL (ref 0.0–0.7)
Eosinophils Relative: 1 % (ref 0–5)
Eosinophils Relative: 1 % (ref 0–5)
Lymphocytes Relative: 28 % (ref 12–46)
Lymphs Abs: 1.3 10*3/uL (ref 0.7–4.0)
Monocytes Absolute: 0.5 10*3/uL (ref 0.1–1.0)
Monocytes Relative: 10 % (ref 3–12)

## 2010-06-15 LAB — COMPREHENSIVE METABOLIC PANEL
ALT: 47 U/L (ref 0–53)
AST: 23 U/L (ref 0–37)
AST: 27 U/L (ref 0–37)
Albumin: 3.9 g/dL (ref 3.5–5.2)
Alkaline Phosphatase: 55 U/L (ref 39–117)
Alkaline Phosphatase: 56 U/L (ref 39–117)
BUN: 15 mg/dL (ref 6–23)
CO2: 29 mEq/L (ref 19–32)
Calcium: 9 mg/dL (ref 8.4–10.5)
Chloride: 105 mEq/L (ref 96–112)
Creatinine, Ser: 1.21 mg/dL (ref 0.4–1.5)
GFR calc Af Amer: >60 mL/min (ref >60)
GFR calc Af Amer: >60 mL/min (ref >60)
GFR calc non Af Amer: >60 mL/min (ref >60)
Glucose, Bld: 91 mg/dL (ref 70–99)
Potassium: 4.4 mEq/L (ref 3.5–5.1)
Potassium: 4.5 mEq/L (ref 3.5–5.1)
Sodium: 141 mEq/L (ref 135–145)
Total Bilirubin: 0.6 mg/dL (ref 0.3–1.2)
Total Protein: 6.6 g/dL (ref 6.0–8.3)

## 2010-06-15 LAB — URINALYSIS, ROUTINE W REFLEX MICROSCOPIC
Glucose, UA: NEGATIVE mg/dL
Hgb urine dipstick: NEGATIVE
Specific Gravity, Urine: 1.025 (ref 1.005–1.030)
pH: 7 (ref 5.0–8.0)

## 2010-06-15 LAB — LIPASE, BLOOD: Lipase: 22 U/L (ref 11–59)

## 2010-07-18 NOTE — Procedures (Signed)
NAME:  [name] [name], [name] [name] NO.:  [id number]   MEDICAL RECORD NO.:  [id number]          PATIENT TYPE:  OUT   LOCATION:  SLEEP CENTER                 FACILITY:  [facility]   PHYSICIAN:  Barbaraann Share, MD,FCCPDATE OF BIRTH:  03-12-71   DATE OF STUDY:  10/02/2006                            NOCTURNAL POLYSOMNOGRAM   REFERRING PHYSICIAN:  Barbaraann Share, MD,FCCP   INDICATION FOR STUDY:  Hypersomnia with sleep apnea.   EPWORTH SLEEPINESS SCORE:  10   MEDICATIONS:   SLEEP ARCHITECTURE:  The patient had a total sleep time of 368 minutes  with no significant slow-wave sleep and decreased quantity of REM. Sleep  onset latency was normal and REM onset was probably prolonged. Sleep  efficiency was excellent at 94%.   RESPIRATORY DATA:  The patient underwent split-night protocol where he  was found to have 68 obstructive events in the first 154 minutes or  sleep. This gave him an extrapolated apnea/hypopnea index of 27 events  per hour during the first half of the night. The events were not  positional and there was moderate snoring noted throughout. By protocol,  the patient was then placed on a large ResMed Quattro mask and ultimate  titrated to a final pressure of 9 cm of water.   OXYGEN DATA:  There was 02 desaturation as low as 82% prior to CPAP  initiation.   CARDIAC DATA:  Rare fusion beat noted, but no clinically significant  arrhythmias.   MOVEMENT-PARASOMNIA:  None   IMPRESSIONS-RECOMMENDATIONS:  Split-night study reveals moderate  obstructive sleep apnea/hypopnea syndrome with an apnea/hypopnea index  of 27 events per hour and 02 desaturation as low as 82% during the first  half of the night. The patient was then placed on CPAP with a large  ResMed Quattro full face mask and titrated to a final pressure of 9 cm  of water for both obstructive events and snoring.      Barbaraann Share, MD,FCCP  Diplomate, American Board of Sleep  Medicine  Electronically  Signed     [facility]/MEDQ  D:  10/16/2006 17:11:54  T:  10/17/2006 16:10:96  Job:  [id number]

## 2010-07-18 NOTE — Assessment & Plan Note (Signed)
[facility]                             PULMONARY OFFICE NOTE   NAME:[name], [name]                     MRN:          [id number]  DATE:08/30/2006                            DOB:          01/28/1971    SLEEP MEDICINE CONSULTATION:   HISTORY OF PRESENT ILLNESS:  Patient is a 40 year old gentleman, whom I  have been asked to see for sleeping difficulties.  Patient states that  he has difficulty breathing during the night and has very restless and  disruptive sleep.  He states that he has been told that he has loud  snoring, but no one has ever mentioned pauses in the breathing during  sleep.  Patient typically goes to bed at 10 p.m. and gets up at 6 a.m.  to start his day.  He is not rested upon arising.  He awakens at least  three to four times a night and sometimes feels he cannot breathe.  He  denies any gasping arousals.  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  He does  state, however, that once he gets home, he is exhausted and will fall  asleep in the recliner often.  He has sleepiness in the afternoon and  evening, trying to watch TV or movies.  He has no difficulties with  sleepiness while driving.  Of note, the patient's weight is up about 30-  40 pounds over the last two years.   PAST MEDICAL HISTORY:  1. Significant for hypertension.  2. History of asthma.   CURRENT MEDICATIONS INCLUDE:  1. Amlodipine 5 mg daily.  2. Hydrochlorothiazide 12.5 mg daily.   Patient is allergic to PENICILLIN and CODEINE.   SOCIAL HISTORY:  He is single.  He does not have children.  He has a  history of smoking up to four to five packs per day for 15 years.  Currently, he is smoking one pack per day.   FAMILY HISTORY:  Remarkable for his mother having asthma, as well as  heart disease.  Father also had heart disease.   Mother has had breast  cancer.   REVIEW OF SYSTEMS:  As per History of Present Illness.  Also see patient  intake form, documented on the chart.   PHYSICAL EXAM:  IN GENERAL:  He is an obese white male, in no acute  distress.  Blood pressure 138/94, pulse 69, temperature 98.4, weight is 352 pounds,  O2 saturation on room air is 98%.  HEENT:  Pupils were equal, round, reactive to light and accommodation.  Extraocular muscles are intact.  Nares show deviated septum to the left  with near obstruction.  Oropharynx shows significant elongation of soft  palate and uvula with significant side wall narrowing and a small  posterior pharyngeal space.  NECK:  Large and difficult to assess for JVD.  There is no thyromegaly  or lymphadenopathy.  CHEST:  Totally clear.  CARDIAC EXAM:  Reveals regular rate and rhythm, no murmurs, rubs or  gallops.  ABDOMEN:  Soft, nontender with good bowel sounds.  GENITAL EXAM, RECTAL EXAM, BREAST EXAM:  Not done and not indicated.  LOWER EXTREMITIES:  Without edema.  Good pulses distally.  No calf  tenderness.  NEUROLOGICALLY:  Alert and oriented without evidence of motor deficits.   IMPRESSION:  Probable obstructive sleep apnea.  The patient has very  abnormal upper airway anatomy, is morbidly obese, and describes a lot of  symptoms consistent with this diagnosis.  I really think at this point  in time, that he needs to have a sleep study and the patient is  agreeable to this approach.   PLAN:  1. Assess with nocturnal polysomnogram.  2. Work on weight-loss.  3. Patient will follow up after the above.     Barbaraann Share, MD,FCCP  Electronically Signed    [facility]/Med[facility]  DD: 08/30/2006  DT: 08/30/2006  Job #: [id number]   cc:   Arta Silence, MD

## 2010-07-21 NOTE — Assessment & Plan Note (Signed)
[facility]                           GASTROENTEROLOGY OFFICE NOTE   NAME:[name] [name]                     MRN:          [id number]  DATE:09/19/2005                            DOB:          1971/01/29    REFERRING PHYSICIAN:  Dr. Everrett Coombe.   REASON FOR REFERRAL:  Dr. Jillyn Hidden asked me to evaluate [name] [name] in  consultation regarding recent rectal bleeding.   HISTORY OF PRESENT ILLNESS:  [name] [name] is a very pleasant 40 year old man  who has had intermittent bright red blood per rectum for one to two years.  He says usually this occurs around the time of straining to move his bowels.  In the past two to three months, the bleeding has been a bit more dramatic  and he describes blood actually dripping into the toilet water and turning  it quite red.  He has had no chest pain, no shortness of breath, no  lightheadedness when standing.  He had labs drawn three weeks ago showing a  normal CBC, normal comprehensive metabolic profile and normal thyroid  studies.   His moves his bowels regularly every day but does have to strain quite a bit  and feels that the amount that comes out is much less than he would normally  expect and he feels there is quite a bit left in side.  He has never tried  fiber supplements and says he eats a very low fiber diet.   REVIEW OF SYSTEMS:  Notable for recent weight gain, although in the past one  to two years he has lost 80 to 100 pounds intentionally.  NSAID abuse in the  past but none in the past several months.  This was around the time of tooth  problem.  Rest of the review of systems is essentially unrevealing and is  available on his nursing intake sheet.   PAST MEDICAL HISTORY:  1.  Morbid obesity.  2.  Hypertension.  3.  Asthma.   CURRENT MEDICATION:  Lisinopril.   ALLERGIES:  PENICILLIN and CODEINE.   SOCIAL HISTORY:  Single, no children, works as s Naval architect, smokes a pack  and a half of  cigarettes a day and drinks once a week.   FAMILY HISTORY:  Breast cancer in several relatives.  Alcoholism in his  uncle.  No colon cancer or colon polyps that he knows of in his family.   PHYSICAL EXAMINATION:  VITAL SIGNS:  Height 6 feet 2 inches, 352 pounds.  Blood pressure 138/90, pulse 58.  CONSTITUTIONAL:  Morbidly obese, otherwise well-appearing.  HEENT:  Eyes:  Extraocular movements intact.  Mouth:  Oropharynx moist, no  lesions.  NECK:  Supple.  No lymphadenopathy.  LUNGS:  Clear to auscultation bilaterally.  CARDIOVASCULAR:  Heart with regular rate and rhythm.  ABDOMEN:  Soft, nontender and nondistended with normal bowel sounds.  EXTREMITIES:  No lower extremity edema.  SKIN:  No rashes or lesions on visible extremities.  RECTAL:  Exam deferred due to plan below.  NEUROLOGIC:  Alert and oriented x3.   ASSESSMENT/PLAN:  A 40 year old man with intermittent rectal bleeding.  First, it does sound like this is hemorrhoidal bleeding and occurs around  the time of straining.  He is going to begin taking fiber supplements with  Citrucel on a daily basis and titrate up or down as needed.  I suspect that  he is able to move his bowels very easily without straining with complete  evacuation, that may reduce his rectal bleeding quite a lot.  Given his age  and lack of family history, he is at low risk for colon cancer.  He has no  anemia but I think proceeding with flexible sigmoidoscopy just to make sure  he does not have any low lesion and that he indeed does have hemorrhoidal  disease is reasonable, so we will arrange for him to have a flexible  sigmoidoscopy in the next two to three weeks.  I see no reason for any blood  tests or imaging studies prior to then.                                   Rachael Fee, MD   DPJ/MedQ  DD:  09/19/2005  DT:  09/19/2005  Job #:  [id number]   cc:   Billie D. Bean, FNP

## 2010-08-18 ENCOUNTER — Other Ambulatory Visit: Payer: Self-pay | Admitting: Family Medicine

## 2010-10-04 HISTORY — PX: LAPAROSCOPIC GASTRIC SLEEVE RESECTION: SHX5895

## 2010-10-17 ENCOUNTER — Encounter: Payer: Self-pay | Admitting: Family Medicine

## 2010-10-18 ENCOUNTER — Ambulatory Visit (INDEPENDENT_AMBULATORY_CARE_PROVIDER_SITE_OTHER): Payer: BC Managed Care – PPO | Admitting: Family Medicine

## 2010-10-18 ENCOUNTER — Encounter: Payer: Self-pay | Admitting: Family Medicine

## 2010-10-18 VITALS — BP 136/86 | HR 88 | Temp 97.7°F | Wt 398.0 lb

## 2010-10-18 DIAGNOSIS — H109 Unspecified conjunctivitis: Secondary | ICD-10-CM

## 2010-10-18 DIAGNOSIS — R5381 Other malaise: Secondary | ICD-10-CM

## 2010-10-18 DIAGNOSIS — R5383 Other fatigue: Secondary | ICD-10-CM

## 2010-10-18 HISTORY — DX: Unspecified conjunctivitis: H10.9

## 2010-10-18 LAB — CBC WITH DIFFERENTIAL/PLATELET
Eosinophils Absolute: 0.1 10*3/uL (ref 0.0–0.7)
Eosinophils Relative: 1.2 % (ref 0.0–5.0)
HCT: 26.9 % — ABNORMAL LOW (ref 39.0–52.0)
Lymphs Abs: 1.4 10*3/uL (ref 0.7–4.0)
MCHC: 33.3 g/dL (ref 30.0–36.0)
MCV: 87.2 fl (ref 78.0–100.0)
Monocytes Absolute: 0.8 10*3/uL (ref 0.1–1.0)
Neutrophils Relative %: 76.8 % (ref 43.0–77.0)
Platelets: 313 10*3/uL (ref 150.0–400.0)

## 2010-10-18 LAB — HEPATIC FUNCTION PANEL
ALT: 53 U/L (ref 0–53)
Bilirubin, Direct: 0.5 mg/dL — ABNORMAL HIGH (ref 0.0–0.3)
Total Bilirubin: 1.9 mg/dL — ABNORMAL HIGH (ref 0.3–1.2)
Total Protein: 6.8 g/dL (ref 6.0–8.3)

## 2010-10-18 LAB — BASIC METABOLIC PANEL
BUN: 12 mg/dL (ref 6–23)
Chloride: 101 mEq/L (ref 96–112)
GFR: 106.75 mL/min (ref >60.00)
Potassium: 3.8 mEq/L (ref 3.5–5.1)
Sodium: 136 mEq/L (ref 135–145)

## 2010-10-18 MED ORDER — SULFACETAMIDE SODIUM 10 % OP SOLN: 1.0000 [drp] | Freq: Four times a day (QID) | OPHTHALMIC | Status: AC

## 2010-10-18 MED ORDER — OXYCODONE HCL 10 MG PO TB12: 10.0000 mg | ORAL_TABLET | Freq: Three times a day (TID) | ORAL | Status: AC | PRN

## 2010-10-18 NOTE — Progress Notes (Signed)
SUBJECTIVE:  40 y.o. male with burning, redness, discharge and mattering in both eyes for 3 days.  No other symptoms.  No significant prior ophthalmological history. No change in visual acuity, no photophobia, no severe eye pain. Recently discharged from [facility] s/p gastric sleeve procedure last week. Procedure was complicated by blood loss per pt. He is very fatigued. No CP but does feel "winded." Can barely eat so he thinks fatigue is due to that. Has already lost 25 pounds.  OBJECTIVE:  BP 136/86  Pulse 88  Temp(Src) 97.7 F (36.5 C) (Oral)  Wt 398 lb (180.532 kg) Wt Readings from Last 3 Encounters:  10/18/10 398 lb (180.532 kg)  05/15/10 423 lb (191.872 kg)  04/24/10 429 lb 4 oz (194.707 kg)     Patient appears pale, fatigued, vitals signs are normal. Eyes: both eyes with findings of typical conjunctivitis noted; erythema and discharge. PERRLA, no foreign body noted. No periorbital cellulitis. The corneas are clear and fundi normal. Visual acuity normal. Skin:  Diffuse echymosis on abdomen, lap sites c/d/i   Patient Active Problem List  Diagnoses  . COLONIC POLYPS  . MORBID OBESITY  . ANXIETY  . ANGER  . ADHD  . HYPERTENSION, BENIGN ESSENTIAL  . HEMORRHOIDS, EXTERNAL  . GERD  . SLEEP APNEA  . DEPENDENT EDEMA  . GASTROENTERITIS WITHOUT DEHYDRATION   Past Medical History  Diagnosis Date  . GERD (gastroesophageal reflux disease)   . Hypertension    Past Surgical History  Procedure Date  . Colonoscopy 10/16/2005    Polyps, external hemorrhoids  . Laparoscopic gastric banding 10/2010   History  Substance Use Topics  . Smoking status: Not on file  . Smokeless tobacco: Not on file  . Alcohol Use: Not on file   No family history on file. Allergies  Allergen Reactions  . Codeine     REACTION: unspecified  . Penicillins     REACTION: urticaria (hives)   Current Outpatient Prescriptions on File Prior to Visit  Medication Sig Dispense Refill  .  amLODipine (NORVASC) 5 MG tablet TAKE ONE TABLET BY MOUTH EVERY DAY FOR BLOOD PRESSURE  90 tablet  0  . clotrimazole (LOTRIMIN) 1 % cream Apply to area two times a day for 3 weeks       . hydrochlorothiazide 25 MG tablet Take 25 mg by mouth every morning.        Marland Kitchen omeprazole (PRILOSEC) 20 MG capsule Take 20 mg by mouth daily.          The PMH, PSH, Social History, Family History, Medications, and allergies have been reviewed in [facility], and have been updated if relevant.   ASSESSMENT/Plan: 1. Conjunctivitis   probably bacterial  Bleph 10 drops per order. Hygiene discussed. If other family members develop same condition, may use same medication for them if they are not known to be allergic to it. Call prn.   2. Fatigue Very concerned about blood loss/anemia. Pt is very pale, fatigued. Requested records from [facility] immediately from procedure last week and labs. Order labs today stat. Orders Placed This Encounter  Procedures  . CBC w/Diff  . Hepatic function panel  . Basic Metabolic Panel (BMET)

## 2010-11-03 ENCOUNTER — Encounter: Payer: Self-pay | Admitting: Gastroenterology

## 2010-11-15 ENCOUNTER — Other Ambulatory Visit: Payer: Self-pay | Admitting: Family Medicine

## 2010-12-05 LAB — POCT I-STAT, CHEM 8
BUN: 14
Chloride: 103
Creatinine, Ser: 1.1
Sodium: 141

## 2010-12-05 LAB — CBC
Hemoglobin: 15.7
RBC: 5.27
WBC: 6.4

## 2010-12-05 LAB — DIFFERENTIAL
Basophils Relative: 0
Lymphocytes Relative: 22
Lymphs Abs: 1.4
Monocytes Absolute: 0.5
Monocytes Relative: 9
Neutro Abs: 4.4
Neutrophils Relative %: 68

## 2010-12-05 LAB — POCT CARDIAC MARKERS
CKMB, poc: <1 — ABNORMAL LOW
Myoglobin, poc: 60.4
Troponin i, poc: <0.05

## 2011-11-22 ENCOUNTER — Other Ambulatory Visit: Payer: Self-pay | Admitting: *Deleted

## 2011-11-22 MED ORDER — OMEPRAZOLE 20 MG PO CPDR: 20.0000 mg | DELAYED_RELEASE_CAPSULE | Freq: Every day | ORAL | Status: DC

## 2011-11-28 ENCOUNTER — Ambulatory Visit (INDEPENDENT_AMBULATORY_CARE_PROVIDER_SITE_OTHER): Payer: BC Managed Care – PPO | Admitting: Emergency Medicine

## 2011-11-28 DIAGNOSIS — Z0289 Encounter for other administrative examinations: Secondary | ICD-10-CM

## 2011-11-28 NOTE — Progress Notes (Signed)
  Subjective:    Patient ID: [name] [name], male    DOB: 30-Jun-1970, 41 y.o.   MRN: [id number]  HPI  Corrections physical   Review of Systems As per HPI, otherwise negative.      Objective:   Physical Exam  GEN: WDWN, NAD, Non-toxic, A & O x 3.  obese HEENT: Atraumatic, Normocephalic. Neck supple. No masses, No LAD. Ears and Nose: No external deformity. CV: RRR, No M/G/R. No JVD. No thrill. No extra heart sounds. PULM: CTA B, no wheezes, crackles, rhonchi. No retractions. No resp. distress. No accessory muscle use. ABD: S, NT, ND, +BS. No rebound. No HSM. EXTR: No c/c/e NEURO Normal gait.  PSYCH: Normally interactive. Conversant. Not depressed or anxious appearing.  Calm demeanor.        Assessment & Plan:   TB screening completed Physical forms completd  I have reviewed and agree with documentation. Robert P. Merla Riches, M.D.

## 2011-12-06 ENCOUNTER — Encounter: Payer: Self-pay | Admitting: Gastroenterology

## 2012-03-11 ENCOUNTER — Other Ambulatory Visit: Payer: Self-pay | Admitting: Family Medicine

## 2012-03-12 ENCOUNTER — Other Ambulatory Visit: Payer: Self-pay | Admitting: *Deleted

## 2012-03-12 NOTE — Telephone Encounter (Signed)
Please call pt to get more information. 

## 2012-03-12 NOTE — Telephone Encounter (Signed)
Patient request refill on phenergan 25mg . Medication is not on med list is this okay?

## 2012-03-13 NOTE — Telephone Encounter (Signed)
Left message asking patient to call back

## 2012-03-14 NOTE — Telephone Encounter (Signed)
Spoke with patient, he states he didn't request this refill, doesn't know why [facility] requested it.

## 2012-05-28 ENCOUNTER — Ambulatory Visit (INDEPENDENT_AMBULATORY_CARE_PROVIDER_SITE_OTHER): Payer: BC Managed Care – PPO | Admitting: Family Medicine

## 2012-05-28 ENCOUNTER — Encounter: Payer: Self-pay | Admitting: Family Medicine

## 2012-05-28 VITALS — BP 120/68 | Temp 97.6°F | Wt 378.0 lb

## 2012-05-28 DIAGNOSIS — L918 Other hypertrophic disorders of the skin: Secondary | ICD-10-CM

## 2012-05-28 DIAGNOSIS — D239 Other benign neoplasm of skin, unspecified: Secondary | ICD-10-CM

## 2012-05-28 DIAGNOSIS — D229 Melanocytic nevi, unspecified: Secondary | ICD-10-CM

## 2012-05-28 DIAGNOSIS — I10 Essential (primary) hypertension: Secondary | ICD-10-CM

## 2012-05-28 DIAGNOSIS — L919 Hypertrophic disorder of the skin, unspecified: Secondary | ICD-10-CM

## 2012-05-28 DIAGNOSIS — L909 Atrophic disorder of skin, unspecified: Secondary | ICD-10-CM

## 2012-05-28 HISTORY — DX: Other hypertrophic disorders of the skin: L91.8

## 2012-05-28 HISTORY — DX: Melanocytic nevi, unspecified: D22.9

## 2012-05-28 MED ORDER — AMLODIPINE BESYLATE 5 MG PO TABS: ORAL_TABLET | ORAL | Status: DC

## 2012-05-28 NOTE — Addendum Note (Signed)
Addended by: Eliezer Bottom on: 05/28/2012 12:47 PM   Modules accepted: Orders

## 2012-05-28 NOTE — Progress Notes (Signed)
  Subjective:    Patient ID: [name] [name], male    DOB: 1970/07/29, 42 y.o.   MRN: [id number]  HPI  42 yo male here for:  1.  HTN- has been well controlled since his gastric surgery.  Well controlled on amlodipine 5 mg daily. Denies any HA, blurred vision, CP or SOB.  2. ?Mole- keep growing.  Has not changed in color.  He is concerned that it is cancerous.  Patient Active Problem List  Diagnosis  . COLONIC POLYPS  . MORBID OBESITY  . ANXIETY  . ANGER  . ADHD  . HYPERTENSION, BENIGN ESSENTIAL  . HEMORRHOIDS, EXTERNAL  . GERD  . SLEEP APNEA  . DEPENDENT EDEMA  . GASTROENTERITIS WITHOUT DEHYDRATION  . Conjunctivitis  . Skin tag   Past Medical History  Diagnosis Date  . GERD (gastroesophageal reflux disease)   . Hypertension    Past Surgical History  Procedure Laterality Date  . Colonoscopy  10/16/2005    Polyps, external hemorrhoids  . Gastric restriction surgery  10/2010    sleeve- at [facility]   History  Substance Use Topics  . Smoking status: Former Games developer  . Smokeless tobacco: Not on file  . Alcohol Use: Not on file   No family history on file. Allergies  Allergen Reactions  . Codeine     REACTION: unspecified  . Penicillins     REACTION: urticaria (hives)   Current Outpatient Prescriptions on File Prior to Visit  Medication Sig Dispense Refill  . clotrimazole (LOTRIMIN) 1 % cream Apply to area two times a day for 3 weeks       . omeprazole (PRILOSEC) 20 MG capsule Take 1 capsule (20 mg total) by mouth daily.  30 capsule  11   No current facility-administered medications on file prior to visit.   The PMH, PSH, Social History, Family History, Medications, and allergies have been reviewed in [facility], and have been updated if relevant.   Review of Systems See HPI    Objective:   Physical Exam  Constitutional: He appears well-developed and well-nourished. No distress.  Cardiovascular: Normal rate, regular rhythm, normal heart sounds and intact distal  pulses.   Pulmonary/Chest: Effort normal and breath sounds normal.  Skin: Skin is warm.     Psychiatric: He has a normal mood and affect. His behavior is normal. Judgment and thought content normal.   BP 120/68  Temp(Src) 97.6 F (36.4 C)  Wt 378 lb (171.46 kg)  BMI 48.51 kg/m2 Wt Readings from Last 3 Encounters:  05/28/12 378 lb (171.46 kg)  10/18/10 398 lb (180.532 kg)  05/15/10 423 lb (191.872 kg)          Assessment & Plan:  1. HYPERTENSION, BENIGN ESSENTIAL Stable on amlodipine 5 mg daily. Rx refilled.    2. Skin tag  Skin tag snipped off using Betadine for cleansing and sterile iris scissors. Local anesthesia was used. Given size of lesion, it was sent for pathology.

## 2012-12-01 ENCOUNTER — Encounter: Payer: Self-pay | Admitting: Family Medicine

## 2012-12-01 ENCOUNTER — Ambulatory Visit (INDEPENDENT_AMBULATORY_CARE_PROVIDER_SITE_OTHER): Payer: BC Managed Care – PPO | Admitting: Family Medicine

## 2012-12-01 VITALS — BP 122/84 | HR 82 | Temp 97.6°F | Ht 74.0 in | Wt 395.0 lb

## 2012-12-01 DIAGNOSIS — M25561 Pain in right knee: Secondary | ICD-10-CM

## 2012-12-01 DIAGNOSIS — M25569 Pain in unspecified knee: Secondary | ICD-10-CM

## 2012-12-01 DIAGNOSIS — I10 Essential (primary) hypertension: Secondary | ICD-10-CM

## 2012-12-01 DIAGNOSIS — G8929 Other chronic pain: Secondary | ICD-10-CM | POA: Insufficient documentation

## 2012-12-01 HISTORY — DX: Pain in right knee: M25.561

## 2012-12-01 MED ORDER — AMLODIPINE BESYLATE 5 MG PO TABS: ORAL_TABLET | ORAL | Status: DC

## 2012-12-01 NOTE — Patient Instructions (Addendum)
Good to see you. Keep in touch. Let me know how knee is feeling. Try some Tylenol and if you have some time, ice.

## 2012-12-01 NOTE — Progress Notes (Signed)
Subjective:    Patient ID: [name] [name], male    DOB: [birth year], 42 y.o.   MRN: [id number]  HPI  42 yo male here for:  1.  HTN- has been well controlled  on amlodipine 5 mg daily. Denies any HA, blurred vision, CP or SOB.  2. Obesity- enrolled in 5 K program.  Running two miles three times weekly.  Also has a very physical job now with increased physical tests. Has regained weight but working on losing it. Wt Readings from Last 3 Encounters:  12/01/12 395 lb (179.171 kg)  05/28/12 378 lb (171.46 kg)  10/18/10 398 lb (180.532 kg)   3.  Right knee pain- thinks it's mainly from increased physical activity. Hurts most at end of day.  Mild swelling.  No erythema or warmth.   Patient Active Problem List   Diagnosis Date Noted  . Skin tag 05/28/2012  . Conjunctivitis 10/18/2010  . GASTROENTERITIS WITHOUT DEHYDRATION 05/15/2010  . ANXIETY 11/14/2009  . DEPENDENT EDEMA 11/09/2008  . ANGER 10/31/2006  . COLONIC POLYPS 10/21/2006  . ADHD 10/21/2006  . GERD 10/21/2006  . MORBID OBESITY 07/30/2006  . HYPERTENSION, BENIGN ESSENTIAL 07/30/2006  . SLEEP APNEA 07/30/2006  . HEMORRHOIDS, EXTERNAL 10/03/2005   Past Medical History  Diagnosis Date  . GERD (gastroesophageal reflux disease)   . Hypertension    Past Surgical History  Procedure Laterality Date  . Colonoscopy  10/16/2005    Polyps, external hemorrhoids  . Gastric restriction surgery  10/2010    sleeve- at [facility]   History  Substance Use Topics  . Smoking status: Former Games developer  . Smokeless tobacco: Not on file  . Alcohol Use: Not on file   No family history on file. Allergies  Allergen Reactions  . Codeine     REACTION: unspecified  . Penicillins     REACTION: urticaria (hives)   Current Outpatient Prescriptions on File Prior to Visit  Medication Sig Dispense Refill  . amLODipine (NORVASC) 5 MG tablet TAKE ONE TABLET BY MOUTH EVERY DAY FOR BLOOD PRESSURE  90 tablet  6  . omeprazole (PRILOSEC) 20 MG capsule  Take 1 capsule (20 mg total) by mouth daily.  30 capsule  11  . clotrimazole (LOTRIMIN) 1 % cream Apply to area two times a day for 3 weeks        No current facility-administered medications on file prior to visit.   The PMH, PSH, Social History, Family History, Medications, and allergies have been reviewed in [facility], and have been updated if relevant.   Review of Systems See HPI    Objective:   Physical Exam  BP 122/84  Pulse 82  Temp(Src) 97.6 F (36.4 C) (Oral)  Ht 6\' 2"  (1.88 m)  Wt 395 lb (179.171 kg)  BMI 50.69 kg/m2  SpO2 96%  Constitutional: He appears well-developed and well-nourished. No distress.  Cardiovascular: Normal rate, regular rhythm, normal heart sounds and intact distal pulses.   Pulmonary/Chest: Effort normal and breath sounds normal.  Skin: Skin is warm.   Psychiatric: He has a normal mood and affect. His behavior is normal. Judgment and thought content normal.   Right knee- FROM, neg ant drawer, ?mild effusion, no warmth    Assessment & Plan:   1. HYPERTENSION, BENIGN ESSENTIAL Stable on current dose. Rx refilled.  2. Morbid obesity Deteriorated but he is working on weight.  3. Right knee pain Probably due to weight gain and increased physical activity.  Discussed proper foot wear.  Cannot take NSAIDs  due to gastric bypass.  He will update me with symptoms. If symptom persist, check CXR.

## 2013-01-02 ENCOUNTER — Encounter: Payer: Self-pay | Admitting: Family Medicine

## 2013-01-02 ENCOUNTER — Ambulatory Visit (INDEPENDENT_AMBULATORY_CARE_PROVIDER_SITE_OTHER): Payer: BC Managed Care – PPO | Admitting: Family Medicine

## 2013-01-02 VITALS — BP 130/84 | HR 80 | Temp 98.0°F | Wt 394.2 lb

## 2013-01-02 DIAGNOSIS — M25561 Pain in right knee: Secondary | ICD-10-CM

## 2013-01-02 DIAGNOSIS — M25569 Pain in unspecified knee: Secondary | ICD-10-CM

## 2013-01-02 MED ORDER — DICLOFENAC SODIUM 1 % TD GEL: 1.0000 "application " | Freq: Three times a day (TID) | TRANSDERMAL | Status: DC

## 2013-01-02 MED ORDER — TRAMADOL HCL 50 MG PO TABS: 50.0000 mg | ORAL_TABLET | Freq: Three times a day (TID) | ORAL | Status: DC | PRN

## 2013-01-02 NOTE — Progress Notes (Signed)
  Subjective:    Patient ID: [name] [name], male    DOB: August 05, 1970, 42 y.o.   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  Has to take physical agility test - so has been training for this.   Has been running 2 miles 3x/wk, for last 3 months.  Noticing increasing R knee discomfort since he's started running. Had exercise test on Wednesday where he ran <2 miles.  Did well with run, but now noticing worsening knee pain. Endorses posterior knee, medial knee, and lateral upper knee pain.  Also endorses pain at lower posterior calf. Rest helps. Movement worsens pain. Tylenol doesn't help pain. No inciting trauma/fall.  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  No hip or back pain.  No significant L knee pain. No redness or swelling or warmth of R knee.  Next exercise test is coming up on Monday - worried about not being able to complete.  Wt Readings from Last 3 Encounters:  01/02/13 394 lb 4 oz (178.831 kg)  12/01/12 395 lb (179.171 kg)  05/28/12 378 lb (171.46 kg)  weight increasing over last few years.  H/o gastric sleeve procedure 2012.  Past Medical History  Diagnosis Date  . GERD (gastroesophageal reflux disease)   . Hypertension      Review of Systems Per HPI    Objective:   Physical Exam  Nursing note and vitals reviewed. Constitutional: He appears well-developed and well-nourished. No distress.  obese  Musculoskeletal: He exhibits no edema.  No deformity, erythema or warmth of knee.  No effusion. Mildly tender intermittently to palpation lateral and medial R knee, mildly tender at lateral collateral ligament, mildly tender at popliteal area but no fullness. Tender with full extension at right knee Neg drawer test, neg mcmurray's, neg PF grind Tender with valgus testing. No abnormal patellar mobility. Tender with squeeze test at lower leg, no pain at achilles or peroneal tendon.  Tender with palpation of  medial lower leg  No palpable cords       Assessment & Plan:

## 2013-01-02 NOTE — Assessment & Plan Note (Signed)
No localizing sxs on exam - anticipate weight and wear related knee strain vs LCL strain. Lower leg pain - ?post tib tendonitis. Recommended tramadol for pain (avoids oral NSAIDs), voltaren gel, and ice/rest over weekend. Provided with letter for upcoming physical test to excuse if persistent pain after resting weekend. If not better with treatment plan, discussed calling to schedule appt with [facility] doc for further evaluation, help r/o tibial stress fracture.  Pt agrees with plan.

## 2013-01-02 NOTE — Patient Instructions (Signed)
For pain - treat with tramadol pain medicine and use voltaren gel. Ice to leg. I think you have knee strain and tendonitis of the leg from repetitive running. If not better with above over the weekend, call us for appointment with sports medicine.

## 2013-01-05 ENCOUNTER — Telehealth: Payer: Self-pay | Admitting: *Deleted

## 2013-01-05 NOTE — Telephone Encounter (Signed)
Filled and returned to [facility].

## 2013-01-05 NOTE — Telephone Encounter (Signed)
Voltaren gel requires prior auth, paperwork placed in Dr Gutierrez's inbox.

## 2013-01-05 NOTE — Telephone Encounter (Signed)
Paperwork faxed to [employer], pending response from insurance co.

## 2013-01-14 NOTE — Telephone Encounter (Signed)
I called ins company to check the status of the prior auth, was advised med was approved from 01/09/13-01/08/14, no confirmation fax will be sent. Pharmacy notified of approval via fax, sent to scan.

## 2013-04-09 ENCOUNTER — Telehealth: Payer: Self-pay | Admitting: *Deleted

## 2013-04-09 ENCOUNTER — Encounter: Payer: Self-pay | Admitting: Internal Medicine

## 2013-04-09 ENCOUNTER — Ambulatory Visit (INDEPENDENT_AMBULATORY_CARE_PROVIDER_SITE_OTHER): Payer: BC Managed Care – PPO | Admitting: Internal Medicine

## 2013-04-09 VITALS — BP 130/80 | HR 84 | Temp 98.7°F | Resp 16 | Ht 74.0 in | Wt 396.0 lb

## 2013-04-09 DIAGNOSIS — J209 Acute bronchitis, unspecified: Secondary | ICD-10-CM

## 2013-04-09 HISTORY — DX: Acute bronchitis, unspecified: J20.9

## 2013-04-09 MED ORDER — PROMETHAZINE-DM 6.25-15 MG/5ML PO SYRP: 5.0000 mL | ORAL_SOLUTION | Freq: Four times a day (QID) | ORAL | Status: DC | PRN

## 2013-04-09 MED ORDER — AZITHROMYCIN 500 MG PO TABS: 500.0000 mg | ORAL_TABLET | Freq: Every day | ORAL | Status: DC

## 2013-04-09 NOTE — Telephone Encounter (Signed)
Patient phoned and left message on triage line at 1255 today, stating he was TOLD in visit this morning he had strep throat, his paperwork stated acute bronchitis.  Pt questioning, just exactly WHAT does he have and is the prescribed medication what he needed.  States he saw T. Ronnald Ramp, MD.  Reviewed office notes with patient and reiterated to him that MD had diagnosis for today's visit as acute bronchitis and that intended medication was what MD had ordered.  Advised pt if he was not improved/better by first of the week to call for f/u appt.

## 2013-04-09 NOTE — Patient Instructions (Signed)
Acute Bronchitis Bronchitis is inflammation of the airways that extend from the windpipe into the lungs (bronchi). The inflammation often causes mucus to develop. This leads to a cough, which is the most common symptom of bronchitis.  In acute bronchitis, the condition usually develops suddenly and goes away over time, usually in a couple weeks. Smoking, allergies, and asthma can make bronchitis worse. Repeated episodes of bronchitis may cause further lung problems.  CAUSES Acute bronchitis is most often caused by the same virus that causes a cold. The virus can spread from person to person (contagious).  SIGNS AND SYMPTOMS   Cough.   Fever.   Coughing up mucus.   Body aches.   Chest congestion.   Chills.   Shortness of breath.   Sore throat.  DIAGNOSIS  Acute bronchitis is usually diagnosed through a physical exam. Tests, such as chest X-rays, are sometimes done to rule out other conditions.  TREATMENT  Acute bronchitis usually goes away in a couple weeks. Often times, no medical treatment is necessary. Medicines are sometimes given for relief of fever or cough. Antibiotics are usually not needed but may be prescribed in certain situations. In some cases, an inhaler may be recommended to help reduce shortness of breath and control the cough. A cool mist vaporizer may also be used to help thin bronchial secretions and make it easier to clear the chest.  HOME CARE INSTRUCTIONS  Get plenty of rest.   Drink enough fluids to keep your urine clear or pale yellow (unless you have a medical condition that requires fluid restriction). Increasing fluids may help thin your secretions and will prevent dehydration.   Only take over-the-counter or prescription medicines as directed by your health care provider.   Avoid smoking and secondhand smoke. Exposure to cigarette smoke or irritating chemicals will make bronchitis worse. If you are a smoker, consider using nicotine gum or skin  patches to help control withdrawal symptoms. Quitting smoking will help your lungs heal faster.   Reduce the chances of another bout of acute bronchitis by washing your hands frequently, avoiding people with cold symptoms, and trying not to touch your hands to your mouth, nose, or eyes.   Follow up with your health care provider as directed.  SEEK MEDICAL CARE IF: Your symptoms do not improve after 1 week of treatment.  SEEK IMMEDIATE MEDICAL CARE IF:  You develop an increased fever or chills.   You have chest pain.   You have severe shortness of breath.  You have bloody sputum.   You develop dehydration.  You develop fainting.  You develop repeated vomiting.  You develop a severe headache. MAKE SURE YOU:   Understand these instructions.  Will watch your condition.  Will get help right away if you are not doing well or get worse. Document Released: 03/29/2004 Document Revised: 10/22/2012 Document Reviewed: 08/12/2012 [employer] Patient Information 2014 [employer].  

## 2013-04-09 NOTE — Assessment & Plan Note (Signed)
I will treat the infection with a zpak and will control the cough with phenergan-dm

## 2013-04-09 NOTE — Progress Notes (Signed)
   Subjective:    Patient ID: [name] [name], male    DOB: [birth year], 43 y.o.   MRN: [id number]  URI  This is a new problem. The current episode started in the past 7 days. The problem has been gradually worsening. The maximum temperature recorded prior to his arrival was 100 - 100.9 F. The fever has been present for 1 to 2 days. Associated symptoms include coughing (mild NP cough) and a sore throat. Pertinent negatives include no abdominal pain, chest pain, congestion, diarrhea, dysuria, ear pain, headaches, joint pain, joint swelling, nausea, neck pain, plugged ear sensation, rash, rhinorrhea, sinus pain, sneezing, swollen glands, vomiting or wheezing. He has tried nothing for the symptoms. The treatment provided no relief.      Review of Systems  Constitutional: Positive for fever and chills. Negative for diaphoresis, activity change, appetite change and fatigue.  HENT: Positive for sore throat. Negative for congestion, ear pain, rhinorrhea and sneezing.   Eyes: Negative.   Respiratory: Positive for cough (mild NP cough). Negative for wheezing.   Cardiovascular: Negative.  Negative for chest pain, palpitations and leg swelling.  Gastrointestinal: Negative.  Negative for nausea, vomiting, abdominal pain and diarrhea.  Endocrine: Negative.   Genitourinary: Negative.  Negative for dysuria.  Musculoskeletal: Negative.  Negative for joint pain and neck pain.  Skin: Negative.  Negative for rash.  Allergic/Immunologic: Negative.   Neurological: Negative.  Negative for headaches.  Hematological: Negative.  Negative for adenopathy. Does not bruise/bleed easily.  Psychiatric/Behavioral: Negative.        Objective:   Physical Exam  Vitals reviewed. Constitutional: He is oriented to person, place, and time. He appears well-developed and well-nourished.  Non-toxic appearance. He does not have a sickly appearance. He does not appear ill. No distress.  HENT:  Right Ear: Hearing, tympanic  membrane, external ear and ear canal normal.  Left Ear: Hearing, tympanic membrane, external ear and ear canal normal.  Mouth/Throat: Mucous membranes are normal. Mucous membranes are not pale, not dry and not cyanotic. No oral lesions. No trismus in the jaw. No uvula swelling. Posterior oropharyngeal erythema present. No oropharyngeal exudate, posterior oropharyngeal edema or tonsillar abscesses.  Eyes: Conjunctivae are normal. Right eye exhibits no discharge. Left eye exhibits no discharge. No scleral icterus.  Neck: Normal range of motion. Neck supple. No JVD present. No tracheal deviation present. No thyromegaly present.  Cardiovascular: Normal rate, regular rhythm, normal heart sounds and intact distal pulses.  Exam reveals no gallop and no friction rub.   No murmur heard. Pulmonary/Chest: Effort normal and breath sounds normal. No stridor. No respiratory distress. He has no wheezes. He has no rales. He exhibits no tenderness.  Abdominal: Soft. Bowel sounds are normal. He exhibits no distension and no mass. There is no tenderness. There is no rebound and no guarding.  Musculoskeletal: Normal range of motion. He exhibits no edema and no tenderness.  Lymphadenopathy:    He has no cervical adenopathy.  Neurological: He is oriented to person, place, and time.  Skin: Skin is warm and dry. No rash noted. He is not diaphoretic. No erythema. No pallor.          Assessment & Plan:

## 2013-04-13 ENCOUNTER — Ambulatory Visit (INDEPENDENT_AMBULATORY_CARE_PROVIDER_SITE_OTHER): Payer: BC Managed Care – PPO | Admitting: Family Medicine

## 2013-04-13 ENCOUNTER — Encounter: Payer: Self-pay | Admitting: Family Medicine

## 2013-04-13 VITALS — BP 128/72 | HR 81 | Temp 97.6°F | Wt 390.5 lb

## 2013-04-13 DIAGNOSIS — J029 Acute pharyngitis, unspecified: Secondary | ICD-10-CM

## 2013-04-13 HISTORY — DX: Acute pharyngitis, unspecified: J02.9

## 2013-04-13 NOTE — Progress Notes (Signed)
Subjective:   Patient ID: [name] [name], male    DOB: 13-Mar-1970, 43 y.o.   MRN: [id number]  Chisum Habenicht is a pleasant 43 y.o. year old male who presents to clinic today with Sore Throat and Excessive Sweating  on 04/13/2013  HPI: Was seen at [facility] clinic on 2/5- note reviewed.  Was having fevers, sore throat and recent exposure to strep throat.  No cough or wheezing.  No rapid strep test done.  Diagnosed with bronchitis and placed on zmax.  Fevers have improved.  Throat still sore.  Patient Active Problem List   Diagnosis Date Noted  . Acute pharyngitis 04/13/2013  . Acute bronchitis 04/09/2013  . Right knee pain 12/01/2012  . Skin tag 05/28/2012  . Conjunctivitis 10/18/2010  . GASTROENTERITIS WITHOUT DEHYDRATION 05/15/2010  . ANXIETY 11/14/2009  . DEPENDENT EDEMA 11/09/2008  . ANGER 10/31/2006  . COLONIC POLYPS 10/21/2006  . ADHD 10/21/2006  . GERD 10/21/2006  . MORBID OBESITY 07/30/2006  . HYPERTENSION, BENIGN ESSENTIAL 07/30/2006  . SLEEP APNEA 07/30/2006  . HEMORRHOIDS, EXTERNAL 10/03/2005   Past Medical History  Diagnosis Date  . GERD (gastroesophageal reflux disease)   . Hypertension    Past Surgical History  Procedure Laterality Date  . Colonoscopy  10/16/2005    Polyps, external hemorrhoids  . Gastric restriction surgery  10/2010    sleeve- at [facility]   History  Substance Use Topics  . Smoking status: Former Research scientist (life sciences)  . Smokeless tobacco: Never Used  . Alcohol Use: No     Comment: Occasional   No family history on file. Allergies  Allergen Reactions  . Codeine     REACTION: unspecified  . Penicillins     REACTION: urticaria (hives)   Current Outpatient Prescriptions on File Prior to Visit  Medication Sig Dispense Refill  . acetaminophen (TYLENOL) 500 MG tablet Take 500 mg by mouth every 6 (six) hours as needed for pain.      Marland Kitchen amLODipine (NORVASC) 5 MG tablet TAKE ONE TABLET BY MOUTH EVERY DAY FOR BLOOD PRESSURE  90 tablet  6  .  clotrimazole (LOTRIMIN) 1 % cream Apply to area two times a day for 3 weeks       . diclofenac sodium (VOLTAREN) 1 % GEL Apply 1 application topically 3 (three) times daily.  1 Tube  1  . omeprazole (PRILOSEC) 20 MG capsule Take 1 capsule (20 mg total) by mouth daily.  30 capsule  11  . promethazine-dextromethorphan (PROMETHAZINE-DM) 6.25-15 MG/5ML syrup Take 5 mLs by mouth 4 (four) times daily as needed for cough.  118 mL  0  . traMADol (ULTRAM) 50 MG tablet Take 1 tablet (50 mg total) by mouth every 8 (eight) hours as needed for pain.  30 tablet  0   No current facility-administered medications on file prior to visit.   The PMH, PSH, Social History, Family History, Medications, and allergies have been reviewed in [facility], and have been updated if relevant.   Review of Systems See HPI No CP No SOB NO rashes No n/v/d    Objective:    BP 128/72  Pulse 81  Temp(Src) 97.6 F (36.4 C) (Oral)  Wt 390 lb 8 oz (177.13 kg)  SpO2 98%   Physical Exam  Nursing note and vitals reviewed. Constitutional: He appears well-developed and well-nourished. No distress.  HENT:  Head: Normocephalic and atraumatic.  Right Ear: Hearing and tympanic membrane normal.  Left Ear: Hearing and tympanic membrane normal.  Mouth/Throat: Uvula is midline. Oropharyngeal  exudate and posterior oropharyngeal edema present.  Cardiovascular: Normal rate and regular rhythm.   Pulmonary/Chest: Effort normal and breath sounds normal.  Skin: Skin is warm, dry and intact. No lesion noted.          Assessment & Plan:   Acute pharyngitis Return if symptoms worsen or fail to improve.   Marland Kitchen

## 2013-04-13 NOTE — Assessment & Plan Note (Signed)
Does seem consistent with strep throat- unclear why he was diagnosed with bronchitis. Lungs clear. Was treated with Azithromycin (PCN allergic) which would cover strep pharyngitis. Continue supportive care. Call or return to clinic prn if these symptoms worsen or fail to improve as anticipated. The patient indicates understanding of these issues and agrees with the plan.

## 2013-04-13 NOTE — Patient Instructions (Signed)
Good to see you. You may have had strep throat but you have been treated with antibiotics. Continue supportive care with Ibuprofen, warm tea, salt water gargles. Follow up as needed.

## 2013-04-13 NOTE — Progress Notes (Signed)
Pre-visit discussion using our clinic review tool. No additional management support is needed unless otherwise documented below in the visit note.  

## 2013-08-05 ENCOUNTER — Ambulatory Visit (INDEPENDENT_AMBULATORY_CARE_PROVIDER_SITE_OTHER): Payer: BC Managed Care – PPO | Admitting: Internal Medicine

## 2013-08-05 ENCOUNTER — Encounter (INDEPENDENT_AMBULATORY_CARE_PROVIDER_SITE_OTHER): Payer: Self-pay

## 2013-08-05 ENCOUNTER — Telehealth: Payer: Self-pay

## 2013-08-05 ENCOUNTER — Encounter: Payer: Self-pay | Admitting: Internal Medicine

## 2013-08-05 VITALS — BP 136/96 | HR 76 | Temp 97.8°F | Wt >= 6400 oz

## 2013-08-05 DIAGNOSIS — IMO0002 Reserved for concepts with insufficient information to code with codable children: Secondary | ICD-10-CM

## 2013-08-05 DIAGNOSIS — S46911A Strain of unspecified muscle, fascia and tendon at shoulder and upper arm level, right arm, initial encounter: Secondary | ICD-10-CM

## 2013-08-05 MED ORDER — CYCLOBENZAPRINE HCL 10 MG PO TABS: 10.0000 mg | ORAL_TABLET | Freq: Three times a day (TID) | ORAL | Status: DC | PRN

## 2013-08-05 NOTE — Telephone Encounter (Signed)
2 hours ago started with achy pain in upper rt arm near shoulder and now pain still in upper rt arm but radiating into back at rt shoulder blade; hurts worse when takes deep breath or moves; pain scale now is 8 when moving. No SOB and no CP.No weakness in arm. Pt scheduled appt today at 2:30 with Webb Silversmith NP per Dr Deborra Medina; if pt condition changes or worsens prior to appt pt will got to [facility] or ED.Pt voiced understanding.

## 2013-08-05 NOTE — Progress Notes (Signed)
Pre visit review using our clinic review tool, if applicable. No additional management support is needed unless otherwise documented below in the visit note. 

## 2013-08-05 NOTE — Patient Instructions (Addendum)

## 2013-08-05 NOTE — Progress Notes (Signed)
Subjective:    Patient ID: [name] [name], male    DOB: 02-19-1971, 43 y.o.   MRN: [id number]  HPI  Pt presents to the clinic today with c/o right shoulder pain. He reports it started this morning. The pain radiates into the right chest and down his right arm to the elbow. The pain seems to be worse with taking a deep breath. He denies chest pain or shortness of breath. He reports that it feels like a pulled muscle but does not remember any specific injury to the area.  Review of Systems      Past Medical History  Diagnosis Date  . GERD (gastroesophageal reflux disease)   . Hypertension     Current Outpatient Prescriptions  Medication Sig Dispense Refill  . acetaminophen (TYLENOL) 500 MG tablet Take 500 mg by mouth every 6 (six) hours as needed for pain.      Marland Kitchen amLODipine (NORVASC) 5 MG tablet TAKE ONE TABLET BY MOUTH EVERY DAY FOR BLOOD PRESSURE  90 tablet  6  . omeprazole (PRILOSEC) 20 MG capsule Take 1 capsule (20 mg total) by mouth daily.  30 capsule  11   No current facility-administered medications for this visit.    Allergies  Allergen Reactions  . Codeine     REACTION: unspecified  . Penicillins     REACTION: urticaria (hives)    No family history on file.  History   Social History  . Marital Status: Married    Spouse Name: N/A    Number of Children: 1  . Years of Education: N/A   Occupational History  .     Social History Main Topics  . Smoking status: Former Research scientist (life sciences)  . Smokeless tobacco: Never Used  . Alcohol Use: Yes     Comment: Occasional  . Drug Use: No  . Sexual Activity: Yes   Other Topics Concern  . Not on file   Social History Narrative  . No narrative on file     Constitutional: Denies fever, malaise, fatigue, headache or abrupt weight changes.  Respiratory: Denies difficulty breathing, shortness of breath, cough or sputum production.   Cardiovascular: Denies chest pain, chest tightness, palpitations or swelling in the hands or  feet.  Gastrointestinal: Denies abdominal pain, bloating, constipation, diarrhea or blood in the stool.  Musculoskeletal: Pt reports right shoulder pain. Denies decrease in range of motion, difficulty with gait, or joint pain and swelling.    No other specific complaints in a complete review of systems (except as listed in HPI above).  Objective:   Physical Exam   BP 136/96  Pulse 76  Temp(Src) 97.8 F (36.6 C) (Oral)  Wt 401 lb 8 oz (182.119 kg)  SpO2 98% Wt Readings from Last 3 Encounters:  08/05/13 401 lb 8 oz (182.119 kg)  04/13/13 390 lb 8 oz (177.13 kg)  04/09/13 396 lb (179.624 kg)    General: Appears his stated age, obese but well developed, well nourished in NAD. Cardiovascular: Normal rate and rhythm. S1,S2 noted.  No murmur, rubs or gallops noted. No JVD or BLE edema. No carotid bruits noted. Pulmonary/Chest: Normal effort and positive vesicular breath sounds. No respiratory distress. No wheezes, rales or ronchi noted.  Abdomen: Soft and nontender. Normal bowel sounds, no bruits noted. No distention or masses noted. Liver, spleen and kidneys non palpable. Musculoskeletal: Normal internal and external rotation of the right shoulder. Pain with palpation along the shoulder blade. Muscles very tense along the shoulder blade. Strength 5/5 BUE.  BMET    Component Value Date/Time   NA 136 10/18/2010 0810   K 3.8 10/18/2010 0810   CL 101 10/18/2010 0810   CO2 28 10/18/2010 0810   GLUCOSE 95 10/18/2010 0810   GLUCOSE 83 03/18/2006 0953   BUN 12 10/18/2010 0810   CREATININE 0.9 10/18/2010 0810   CALCIUM 8.6 10/18/2010 0810   GFRNONAA >60 05/18/2008 1637   GFRAA  Value: >60        The eGFR has been calculated using the MDRD equation. This calculation has not been validated in all clinical situations. eGFR's persistently <60 mL/min signify possible Chronic Kidney Disease. 05/18/2008 1637    Lipid Panel     Component Value Date/Time   CHOL 169 03/18/2006 0953   HDL 46.8  03/18/2006 0953   VLDL 17 03/18/2006 0953   LDLCALC 105* 03/18/2006 0953    CBC    Component Value Date/Time   WBC 10.0 10/18/2010 0810   RBC 3.09* 10/18/2010 0810   HGB 8.9* 10/18/2010 0810   HCT 26.9* 10/18/2010 0810   PLT 313.0 10/18/2010 0810   MCV 87.2 10/18/2010 0810   MCHC 33.3 10/18/2010 0810   RDW 14.3 10/18/2010 0810   LYMPHSABS 1.4 10/18/2010 0810   MONOABS 0.8 10/18/2010 0810   EOSABS 0.1 10/18/2010 0810   BASOSABS 0.0 10/18/2010 0810    Hgb A1C No results found for this basename: HGBA1C        Assessment & Plan:   Muscle strain of right shoulder area:  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

## 2013-10-26 ENCOUNTER — Encounter: Payer: Self-pay | Admitting: Internal Medicine

## 2013-10-26 ENCOUNTER — Ambulatory Visit (INDEPENDENT_AMBULATORY_CARE_PROVIDER_SITE_OTHER): Payer: BC Managed Care – PPO | Admitting: Internal Medicine

## 2013-10-26 VITALS — BP 142/94 | HR 88 | Temp 98.2°F | Wt >= 6400 oz

## 2013-10-26 DIAGNOSIS — R197 Diarrhea, unspecified: Secondary | ICD-10-CM

## 2013-10-26 NOTE — Progress Notes (Signed)
Pre visit review using our clinic review tool, if applicable. No additional management support is needed unless otherwise documented below in the visit note. 

## 2013-10-26 NOTE — Patient Instructions (Addendum)

## 2013-10-26 NOTE — Progress Notes (Signed)
Subjective:    Patient ID: [name] [name], male    DOB: [birth year], 43 y.o.   MRN: [id number]  HPI  Pt presents to the clinic today with c/o diarrhea. He reports this started 4 days ago. The stool is loose, but normal in color. He has noticed blood in his stool, but reports this is a chronic issue for him. He does have internal hemorrhoids that bleed sometimes. He denies abdominal pain or rectal pain. He has not tried anything OTC.  Review of Systems      Past Medical History  Diagnosis Date  . GERD (gastroesophageal reflux disease)   . Hypertension     Current Outpatient Prescriptions  Medication Sig Dispense Refill  . acetaminophen (TYLENOL) 500 MG tablet Take 500 mg by mouth every 6 (six) hours as needed for pain.      Marland Kitchen amLODipine (NORVASC) 5 MG tablet TAKE ONE TABLET BY MOUTH EVERY DAY FOR BLOOD PRESSURE  90 tablet  6  . cyclobenzaprine (FLEXERIL) 10 MG tablet Take 1 tablet (10 mg total) by mouth 3 (three) times daily as needed for muscle spasms.  30 tablet  0  . omeprazole (PRILOSEC) 20 MG capsule Take 1 capsule (20 mg total) by mouth daily.  30 capsule  11   No current facility-administered medications for this visit.    Allergies  Allergen Reactions  . Codeine     REACTION: unspecified  . Penicillins     REACTION: urticaria (hives)    No family history on file.  History   Social History  . Marital Status: Married    Spouse Name: N/A    Number of Children: 1  . Years of Education: N/A   Occupational History  .     Social History Main Topics  . Smoking status: Former Research scientist (life sciences)  . Smokeless tobacco: Never Used  . Alcohol Use: Yes     Comment: Occasional  . Drug Use: No  . Sexual Activity: Yes   Other Topics Concern  . Not on file   Social History Narrative  . No narrative on file     Constitutional: Denies fever, malaise, fatigue, headache or abrupt weight changes.   Gastrointestinal: Pt reports diarrhea and blood in stool. Denies abdominal  pain, bloating, constipation.    No other specific complaints in a complete review of systems (except as listed in HPI above).  Objective:   Physical Exam  BP 142/94  Pulse 88  Temp(Src) 98.2 F (36.8 C) (Oral)  Wt 408 lb 8 oz (185.294 kg)  SpO2 98% Wt Readings from Last 3 Encounters:  10/26/13 408 lb 8 oz (185.294 kg)  08/05/13 401 lb 8 oz (182.119 kg)  04/13/13 390 lb 8 oz (177.13 kg)    General: Appears his stated age, obese but well developed, well nourished in NAD. Cardiovascular: Normal rate and rhythm. S1,S2 noted.  No murmur, rubs or gallops noted. No JVD or BLE edema. No carotid bruits noted. Pulmonary/Chest: Normal effort and positive vesicular breath sounds. No respiratory distress. No wheezes, rales or ronchi noted.  Abdomen: Soft and nontender. Normal bowel sounds, no bruits noted. No distention or masses noted. Liver, spleen and kidneys non palpable.   BMET    Component Value Date/Time   NA 136 10/18/2010 0810   K 3.8 10/18/2010 0810   CL 101 10/18/2010 0810   CO2 28 10/18/2010 0810   GLUCOSE 95 10/18/2010 0810   GLUCOSE 83 03/18/2006 0953   BUN 12 10/18/2010 0810   CREATININE  0.9 10/18/2010 0810   CALCIUM 8.6 10/18/2010 0810   GFRNONAA >60 05/18/2008 1637   GFRAA  Value: >60        The eGFR has been calculated using the MDRD equation. This calculation has not been validated in all clinical situations. eGFR's persistently <60 mL/min signify possible Chronic Kidney Disease. 05/18/2008 1637    Lipid Panel     Component Value Date/Time   CHOL 169 03/18/2006 0953   HDL 46.8 03/18/2006 0953   VLDL 17 03/18/2006 0953   LDLCALC 105* 03/18/2006 0953    CBC    Component Value Date/Time   WBC 10.0 10/18/2010 0810   RBC 3.09* 10/18/2010 0810   HGB 8.9* 10/18/2010 0810   HCT 26.9* 10/18/2010 0810   PLT 313.0 10/18/2010 0810   MCV 87.2 10/18/2010 0810   MCHC 33.3 10/18/2010 0810   RDW 14.3 10/18/2010 0810   LYMPHSABS 1.4 10/18/2010 0810   MONOABS 0.8 10/18/2010 0810   EOSABS  0.1 10/18/2010 0810   BASOSABS 0.0 10/18/2010 0810    Hgb A1C No results found for this basename: HGBA1C         Assessment & Plan:   Diarrhea:  Likely viral Encouraged him to drink plenty of fluids Advised him he could take Imodium OTC if he wanted- he declines Work note provided  Return precautions given

## 2013-11-09 ENCOUNTER — Emergency Department (HOSPITAL_COMMUNITY)
Admission: EM | Admit: 2013-11-09 | Discharge: 2013-11-09 | Disposition: A | Discharge status: Home / Self Care | Payer: BC Managed Care – PPO | Attending: Emergency Medicine | Admitting: Emergency Medicine

## 2013-11-09 ENCOUNTER — Encounter (HOSPITAL_COMMUNITY): Payer: Self-pay | Admitting: Emergency Medicine

## 2013-11-09 ENCOUNTER — Emergency Department (HOSPITAL_COMMUNITY): Payer: BC Managed Care – PPO

## 2013-11-09 DIAGNOSIS — Z87891 Personal history of nicotine dependence: Secondary | ICD-10-CM | POA: Diagnosis not present

## 2013-11-09 DIAGNOSIS — K219 Gastro-esophageal reflux disease without esophagitis: Secondary | ICD-10-CM | POA: Insufficient documentation

## 2013-11-09 DIAGNOSIS — Z79899 Other long term (current) drug therapy: Secondary | ICD-10-CM | POA: Insufficient documentation

## 2013-11-09 DIAGNOSIS — J209 Acute bronchitis, unspecified: Secondary | ICD-10-CM | POA: Diagnosis not present

## 2013-11-09 DIAGNOSIS — I10 Essential (primary) hypertension: Secondary | ICD-10-CM | POA: Diagnosis not present

## 2013-11-09 DIAGNOSIS — Z880 Allergy status to penicillin: Secondary | ICD-10-CM | POA: Diagnosis not present

## 2013-11-09 DIAGNOSIS — R0602 Shortness of breath: Secondary | ICD-10-CM | POA: Diagnosis present

## 2013-11-09 MED ORDER — PREDNISONE 20 MG PO TABS
60.0000 mg | ORAL_TABLET | Freq: Once | ORAL | Status: AC
  Administered 2013-11-09: 60 mg via ORAL
  Filled 2013-11-09: qty 3

## 2013-11-09 MED ORDER — IPRATROPIUM-ALBUTEROL 0.5-2.5 (3) MG/3ML IN SOLN
3.0000 mL | Freq: Once | RESPIRATORY_TRACT | Status: AC
  Administered 2013-11-09: 3 mL via RESPIRATORY_TRACT
  Filled 2013-11-09: qty 3

## 2013-11-09 MED ORDER — ALBUTEROL SULFATE HFA 108 (90 BASE) MCG/ACT IN AERS: 2.0000 | INHALATION_SPRAY | RESPIRATORY_TRACT | Status: DC | PRN

## 2013-11-09 MED ORDER — PREDNISONE 50 MG PO TABS: 50.0000 mg | ORAL_TABLET | Freq: Every day | ORAL | Status: DC

## 2013-11-09 NOTE — ED Notes (Addendum)
Pt c/o persistent cough, SOB and chest tightness, pt states he is been having some low grade fever at home and coughing up some bloody secretion since Saturday.

## 2013-11-09 NOTE — ED Provider Notes (Signed)
CSN: [id number]     Arrival date & time 11/09/13  0431 History   First MD Initiated Contact with Patient 11/09/13 0533     Chief Complaint  Patient presents with  . Shortness of Breath  . Cough     (Consider location/radiation/quality/duration/timing/severity/associated sxs/prior Treatment) Patient is a 43 y.o. male presenting with shortness of breath and cough. The history is provided by the patient.  Shortness of Breath Associated symptoms: cough   Cough Associated symptoms: shortness of breath   He has been having difficulty breathing for the last 4 days. There is associated cough and that tonight he noted that there were some streaks of blood in what he was coughing up. His chest is sore from coughing but he does not actually have any chest pain per se. He has not noticed any fever or chills or sweats. There's been no nausea or vomiting. He tried taking a hot shower with no benefit. He is a former smoker having quit 6 years ago.  Past Medical History  Diagnosis Date  . GERD (gastroesophageal reflux disease)   . Hypertension    Past Surgical History  Procedure Laterality Date  . Colonoscopy  10/16/2005    Polyps, external hemorrhoids  . Gastric restriction surgery  10/2010    sleeve- at [facility]   History reviewed. No pertinent family history. History  Substance Use Topics  . Smoking status: Former Research scientist (life sciences)  . Smokeless tobacco: Former Systems developer  . Alcohol Use: Yes     Comment: Occasional    Review of Systems  Respiratory: Positive for cough and shortness of breath.   All other systems reviewed and are negative.     Allergies  Codeine and Penicillins  Home Medications   Prior to Admission medications   Medication Sig Start Date End Date Taking? Authorizing Provider  acetaminophen (TYLENOL) 500 MG tablet Take 500-1,000 mg by mouth every 6 (six) hours as needed for mild pain.    Yes Historical Provider, MD  Acetaminophen-Guaifenesin (TYLENOL CHEST CONGESTION PO) Take 2  tablets by mouth daily as needed (cold).   Yes Historical Provider, MD  amLODipine (NORVASC) 5 MG tablet Take 5 mg by mouth daily.   Yes Historical Provider, MD  cyclobenzaprine (FLEXERIL) 10 MG tablet Take 1 tablet (10 mg total) by mouth 3 (three) times daily as needed for muscle spasms. 08/05/13  Yes Jearld Fenton, NP  omeprazole (PRILOSEC) 20 MG capsule Take 1 capsule (20 mg total) by mouth daily. 11/22/11  Yes Lucille Passy, MD   BP 133/92  Pulse 81  Temp(Src) 97.8 F (36.6 C) (Oral)  Resp 13  Ht 6\' 2"  (1.88 m)  Wt 409 lb (185.521 kg)  BMI 52.49 kg/m2  SpO2 98% Physical Exam  Nursing note and vitals reviewed.  Morbidly obese 43 year old male, resting comfortably and in no acute distress. Vital signs are significant for borderline hypertension. Oxygen saturation is 98%, which is normal. Head is normocephalic and atraumatic. PERRLA, EOMI. Oropharynx is clear. Neck is nontender and supple without adenopathy or JVD. Back is nontender and there is no CVA tenderness. Lungs have prolonged exhalation phase with coarse wheezes noted on forced exhalation. There are no rales or rhonchi. Chest is nontender. Heart has regular rate and rhythm without murmur. Abdomen is soft, flat, nontender without masses or hepatosplenomegaly and peristalsis is normoactive. Extremities have  trace edema, full range of motion is present. Skin is warm and dry without rash. Neurologic: Mental status is normal, cranial nerves are intact,  there are no motor or sensory deficits.  ED Course  Procedures (including critical care time) Labs Review Results for orders placed in visit on 10/18/10  CBC WITH DIFFERENTIAL      Result Value Ref Range   WBC 10.0  4.5 - 10.5 K/uL   RBC 3.09 (*) 4.22 - 5.81 Mil/uL   Hemoglobin 8.9 (*) 13.0 - 17.0 g/dL   HCT 26.9 (*) 39.0 - 52.0 %   MCV 87.2  78.0 - 100.0 fl   MCHC 33.3  30.0 - 36.0 g/dL   RDW 14.3  11.5 - 14.6 %   Platelets 313.0  150.0 - 400.0 K/uL   Neutrophils Relative  % 76.8  43.0 - 77.0 %   Lymphocytes Relative 13.9  12.0 - 46.0 %   Monocytes Relative 7.8  3.0 - 12.0 %   Eosinophils Relative 1.2  0.0 - 5.0 %   Basophils Relative 0.3  0.0 - 3.0 %   Neutro Abs 7.7  1.4 - 7.7 K/uL   Lymphs Abs 1.4  0.7 - 4.0 K/uL   Monocytes Absolute 0.8  0.1 - 1.0 K/uL   Eosinophils Absolute 0.1  0.0 - 0.7 K/uL   Basophils Absolute 0.0  0.0 - 0.1 K/uL  HEPATIC FUNCTION PANEL      Result Value Ref Range   Total Bilirubin 1.9 (*) 0.3 - 1.2 mg/dL   Bilirubin, Direct 0.5 (*) 0.0 - 0.3 mg/dL   Alkaline Phosphatase 64  39 - 117 U/L   AST 33  0 - 37 U/L   ALT 53  0 - 53 U/L   Total Protein 6.8  6.0 - 8.3 g/dL   Albumin 3.6  3.5 - 5.2 g/dL  BASIC METABOLIC PANEL      Result Value Ref Range   Sodium 136  135 - 145 mEq/L   Potassium 3.8  3.5 - 5.1 mEq/L   Chloride 101  96 - 112 mEq/L   CO2 28  19 - 32 mEq/L   Glucose, Bld 95  70 - 99 mg/dL   BUN 12  6 - 23 mg/dL   Creatinine, Ser 0.9  0.4 - 1.5 mg/dL   Calcium 8.6  8.4 - 10.5 mg/dL   GFR 106.75  >60.00 mL/min   Imaging Review Dg Chest 2 View  11/09/2013   CLINICAL DATA:  Productive cough, congestion and shortness of breath.  EXAM: CHEST - 2 VIEW  COMPARISON:  None  FINDINGS: The heart size and mediastinal contours are within normal limits. There is no evidence of pulmonary edema, consolidation, pneumothorax, nodule or pleural fluid. The visualized skeletal structures are unremarkable.  IMPRESSION: No active disease.   Electronically Signed   By: Aletta Edouard M.D.   On: 11/09/2013 07:26     EKG Interpretation   Date/Time:  Monday November 09 2013 04:53:13 EDT Ventricular Rate:  78 PR Interval:  200 QRS Duration: 117 QT Interval:  402 QTC Calculation: 458 R Axis:   -30 Text Interpretation:  Age not entered, assumed to be  43 years old for  purpose of ECG interpretation Sinus rhythm Incomplete RBBB and LAFB  Borderline low voltage, extremity leads When compared with ECG of  05/18/2008, No significant change  was found Confirmed by [facility]  MD, Jamarion [place]  ([id number]) on 11/09/2013 5:23:38 AM      MDM   Final diagnoses:  Acute bronchitis, unspecified organism    Cough and dyspnea which appear to be a respiratory infection. The stricture be obtained to rule  out pneumonia due to given an albuterol with ipratropium nebulizer treatment. Old records are reviewed and he has no relevant past visits.  Following the above-noted treatment, there is considerable improvement but he still would break into coughing paroxysms when attempting forced exhalation. He's given a second albuterol with ipratropium nebulizer treatment and dose of prednisone. He is discharged with prescription for albuterol inhaler and prednisone.  Delora Fuel, MD 83/25/49 [id number]

## 2013-11-09 NOTE — Discharge Instructions (Signed)
Acute Bronchitis Bronchitis is inflammation of the airways that extend from the windpipe into the lungs (bronchi). The inflammation often causes mucus to develop. This leads to a cough, which is the most common symptom of bronchitis.  In acute bronchitis, the condition usually develops suddenly and goes away over time, usually in a couple weeks. Smoking, allergies, and asthma can make bronchitis worse. Repeated episodes of bronchitis may cause further lung problems.  CAUSES Acute bronchitis is most often caused by the same virus that causes a cold. The virus can spread from person to person (contagious) through coughing, sneezing, and touching contaminated objects. SIGNS AND SYMPTOMS   Cough.   Fever.   Coughing up mucus.   Body aches.   Chest congestion.   Chills.   Shortness of breath.   Sore throat.  DIAGNOSIS  Acute bronchitis is usually diagnosed through a physical exam. Your health care provider will also ask you questions about your medical history. Tests, such as chest X-rays, are sometimes done to rule out other conditions.  TREATMENT  Acute bronchitis usually goes away in a couple weeks. Oftentimes, no medical treatment is necessary. Medicines are sometimes given for relief of fever or cough. Antibiotic medicines are usually not needed but may be prescribed in certain situations. In some cases, an inhaler may be recommended to help reduce shortness of breath and control the cough. A cool mist vaporizer may also be used to help thin bronchial secretions and make it easier to clear the chest.  HOME CARE INSTRUCTIONS  Get plenty of rest.   Drink enough fluids to keep your urine clear or pale yellow (unless you have a medical condition that requires fluid restriction). Increasing fluids may help thin your respiratory secretions (sputum) and reduce chest congestion, and it will prevent dehydration.   Take medicines only as directed by your health care provider.  If  you were prescribed an antibiotic medicine, finish it all even if you start to feel better.  Avoid smoking and secondhand smoke. Exposure to cigarette smoke or irritating chemicals will make bronchitis worse. If you are a smoker, consider using nicotine gum or skin patches to help control withdrawal symptoms. Quitting smoking will help your lungs heal faster.   Reduce the chances of another bout of acute bronchitis by washing your hands frequently, avoiding people with cold symptoms, and trying not to touch your hands to your mouth, nose, or eyes.   Keep all follow-up visits as directed by your health care provider.  SEEK MEDICAL CARE IF: Your symptoms do not improve after 1 week of treatment.  SEEK IMMEDIATE MEDICAL CARE IF:  You develop an increased fever or chills.   You have chest pain.   You have severe shortness of breath.  You have bloody sputum.   You develop dehydration.  You faint or repeatedly feel like you are going to pass out.  You develop repeated vomiting.  You develop a severe headache. MAKE SURE YOU:   Understand these instructions.  Will watch your condition.  Will get help right away if you are not doing well or get worse. Document Released: 03/29/2004 Document Revised: 07/06/2013 Document Reviewed: 08/12/2012 [facility] Patient Information 2015 [place], [place]. This information is not intended to replace advice given to you by your health care provider. Make sure you discuss any questions you have with your health care provider.  Prednisone tablets What is this medicine? PREDNISONE (PRED ni sone) is a corticosteroid. It is commonly used to treat inflammation of the skin,  joints, lungs, and other organs. Common conditions treated include asthma, allergies, and arthritis. It is also used for other conditions, such as blood disorders and diseases of the adrenal glands. This medicine may be used for other purposes; ask your health care provider or  pharmacist if you have questions. COMMON BRAND NAME(S): Deltasone, Predone, Sterapred, Sterapred DS What should I tell my health care provider before I take this medicine? They need to know if you have any of these conditions: -Cushing's syndrome -diabetes -glaucoma -heart disease -high blood pressure -infection (especially a virus infection such as chickenpox, cold sores, or herpes) -kidney disease -liver disease -mental illness -myasthenia gravis -osteoporosis -seizures -stomach or intestine problems -thyroid disease -an unusual or allergic reaction to lactose, prednisone, other medicines, foods, dyes, or preservatives -pregnant or trying to get pregnant -breast-feeding How should I use this medicine? Take this medicine by mouth with a glass of water. Follow the directions on the prescription label. Take this medicine with food. If you are taking this medicine once a day, take it in the morning. Do not take more medicine than you are told to take. Do not suddenly stop taking your medicine because you may develop a severe reaction. Your doctor will tell you how much medicine to take. If your doctor wants you to stop the medicine, the dose may be slowly lowered over time to avoid any side effects. Talk to your pediatrician regarding the use of this medicine in children. Special care may be needed. Overdosage: If you think you have taken too much of this medicine contact a poison control center or emergency room at once. NOTE: This medicine is only for you. Do not share this medicine with others. What if I miss a dose? If you miss a dose, take it as soon as you can. If it is almost time for your next dose, talk to your doctor or health care professional. You may need to miss a dose or take an extra dose. Do not take double or extra doses without advice. What may interact with this medicine? Do not take this medicine with any of the following medications: -metyrapone -mifepristone This  medicine may also interact with the following medications: -aminoglutethimide -amphotericin B -aspirin and aspirin-like medicines -barbiturates -certain medicines for diabetes, like glipizide or glyburide -cholestyramine -cholinesterase inhibitors -cyclosporine -digoxin -diuretics -ephedrine -male hormones, like estrogens and birth control pills -isoniazid -ketoconazole -NSAIDS, medicines for pain and inflammation, like ibuprofen or naproxen -phenytoin -rifampin -toxoids -vaccines -warfarin This list may not describe all possible interactions. Give your health care provider a list of all the medicines, herbs, non-prescription drugs, or dietary supplements you use. Also tell them if you smoke, drink alcohol, or use illegal drugs. Some items may interact with your medicine. What should I watch for while using this medicine? Visit your doctor or health care professional for regular checks on your progress. If you are taking this medicine over a prolonged period, carry an identification card with your name and address, the type and dose of your medicine, and your doctor's name and address. This medicine may increase your risk of getting an infection. Tell your doctor or health care professional if you are around anyone with measles or chickenpox, or if you develop sores or blisters that do not heal properly. If you are going to have surgery, tell your doctor or health care professional that you have taken this medicine within the last twelve months. Ask your doctor or health care professional about your diet. You may  need to lower the amount of salt you eat. This medicine may affect blood sugar levels. If you have diabetes, check with your doctor or health care professional before you change your diet or the dose of your diabetic medicine. What side effects may I notice from receiving this medicine? Side effects that you should report to your doctor or health care professional as soon as  possible: -allergic reactions like skin rash, itching or hives, swelling of the face, lips, or tongue -changes in emotions or moods -changes in vision -depressed mood -eye pain -fever or chills, cough, sore throat, pain or difficulty passing urine -increased thirst -swelling of ankles, feet Side effects that usually do not require medical attention (report to your doctor or health care professional if they continue or are bothersome): -confusion, excitement, restlessness -headache -nausea, vomiting -skin problems, acne, thin and shiny skin -trouble sleeping -weight gain This list may not describe all possible side effects. Call your doctor for medical advice about side effects. You may report side effects to FDA at 1-800-FDA-1088. Where should I keep my medicine? Keep out of the reach of children. Store at room temperature between 15 and 30 degrees C (59 and 86 degrees F). Protect from light. Keep container tightly closed. Throw away any unused medicine after the expiration date. NOTE: This sheet is a summary. It may not cover all possible information. If you have questions about this medicine, talk to your doctor, pharmacist, or health care provider.  2015, Elsevier/Gold Standard. (2010-10-05 10:57:14)  Albuterol inhalation aerosol What is this medicine? ALBUTEROL (al [place]) is a bronchodilator. It helps open up the airways in your lungs to make it easier to breathe. This medicine is used to treat and to prevent bronchospasm. This medicine may be used for other purposes; ask your health care provider or pharmacist if you have questions. COMMON BRAND NAME(S): Proair HFA, Proventil, Proventil HFA, Respirol, Ventolin, Ventolin HFA What should I tell my health care provider before I take this medicine? They need to know if you have any of the following conditions: -diabetes -heart disease or irregular heartbeat -high blood pressure -pheochromocytoma -seizures -thyroid  disease -an unusual or allergic reaction to albuterol, levalbuterol, sulfites, other medicines, foods, dyes, or preservatives -pregnant or trying to get pregnant -breast-feeding How should I use this medicine? This medicine is for inhalation through the mouth. Follow the directions on your prescription label. Take your medicine at regular intervals. Do not use more often than directed. Make sure that you are using your inhaler correctly. Ask you doctor or health care provider if you have any questions. Talk to your pediatrician regarding the use of this medicine in children. Special care may be needed. Overdosage: If you think you have taken too much of this medicine contact a poison control center or emergency room at once. NOTE: This medicine is only for you. Do not share this medicine with others. What if I miss a dose? If you miss a dose, use it as soon as you can. If it is almost time for your next dose, use only that dose. Do not use double or extra doses. What may interact with this medicine? -anti-infectives like chloroquine and pentamidine -caffeine -cisapride -diuretics -medicines for colds -medicines for depression or for emotional or psychotic conditions -medicines for weight loss including some herbal products -methadone -some antibiotics like clarithromycin, erythromycin, levofloxacin, and linezolid -some heart medicines -steroid hormones like dexamethasone, cortisone, hydrocortisone -theophylline -thyroid hormones This list may not describe all possible interactions.  Give your health care provider a list of all the medicines, herbs, non-prescription drugs, or dietary supplements you use. Also tell them if you smoke, drink alcohol, or use illegal drugs. Some items may interact with your medicine. What should I watch for while using this medicine? Tell your doctor or health care professional if your symptoms do not improve. Do not use extra albuterol. If your asthma or  bronchitis gets worse while you are using this medicine, call your doctor right away. If your mouth gets dry try chewing sugarless gum or sucking hard candy. Drink water as directed. What side effects may I notice from receiving this medicine? Side effects that you should report to your doctor or health care professional as soon as possible: -allergic reactions like skin rash, itching or hives, swelling of the face, lips, or tongue -breathing problems -chest pain -feeling faint or lightheaded, falls -high blood pressure -irregular heartbeat -fever -muscle cramps or weakness -pain, tingling, numbness in the hands or feet -vomiting Side effects that usually do not require medical attention (report to your doctor or health care professional if they continue or are bothersome): -cough -difficulty sleeping -headache -nervousness or trembling -stomach upset -stuffy or runny nose -throat irritation -unusual taste This list may not describe all possible side effects. Call your doctor for medical advice about side effects. You may report side effects to FDA at 1-800-FDA-1088. Where should I keep my medicine? Keep out of the reach of children. Store at room temperature between 15 and 30 degrees C (59 and 86 degrees F). The contents are under pressure and may burst when exposed to heat or flame. Do not freeze. This medicine does not work as well if it is too cold. Throw away any unused medicine after the expiration date. Inhalers need to be thrown away after the labeled number of puffs have been used or by the expiration date; whichever comes first. Ventolin HFA should be thrown away 12 months after removing from foil pouch. Check the instructions that come with your medicine. NOTE: This sheet is a summary. It may not cover all possible information. If you have questions about this medicine, talk to your doctor, pharmacist, or health care provider.  2015, Elsevier/Gold Standard. (2012-08-07  10:57:17)

## 2013-11-11 ENCOUNTER — Encounter: Payer: Self-pay | Admitting: Family Medicine

## 2013-11-11 ENCOUNTER — Ambulatory Visit (INDEPENDENT_AMBULATORY_CARE_PROVIDER_SITE_OTHER): Payer: BC Managed Care – PPO | Admitting: Family Medicine

## 2013-11-11 VITALS — BP 138/92 | HR 86 | Temp 98.2°F | Wt 399.5 lb

## 2013-11-11 DIAGNOSIS — J209 Acute bronchitis, unspecified: Secondary | ICD-10-CM

## 2013-11-11 NOTE — Progress Notes (Signed)
Subjective:   Patient ID: [name] [name], male    DOB: 1971-02-18, 43 y.o.   MRN: [id number]  Caleb Decock is a pleasant 43 y.o. year old male who presents to clinic today with Hospitalization Follow-up  on 11/11/2013  HPI: Was seen in ER on 11/09/13- note reviewed.  Presented to ER with productive cough - occasionally blood tinged x 4 days  Without fevers. He has noticed "hot and cold flashes and sweats."  Daughter has similar symptoms but she has a fever.  EKG and CXR unremarkable in ER.  Dg Chest 2 View  11/09/2013   CLINICAL DATA:  Productive cough, congestion and shortness of breath.  EXAM: CHEST - 2 VIEW  COMPARISON:  None  FINDINGS: The heart size and mediastinal contours are within normal limits. There is no evidence of pulmonary edema, consolidation, pneumothorax, nodule or pleural fluid. The visualized skeletal structures are unremarkable.  IMPRESSION: No active disease.   Electronically Signed   By: Aletta Edouard M.D.   On: 11/09/2013 07:26   Given nebs treatment x 2 and dose of steroid in ER and discharged home.  Feels a little better today.  Still has cough- honey is helping a little bit.  Using albuterol inhaler prn- feels it is not helping much.  Current Outpatient Prescriptions on File Prior to Visit  Medication Sig Dispense Refill  . acetaminophen (TYLENOL) 500 MG tablet Take 500-1,000 mg by mouth every 6 (six) hours as needed for mild pain.       . Acetaminophen-Guaifenesin (TYLENOL CHEST CONGESTION PO) Take 2 tablets by mouth daily as needed (cold).      Marland Kitchen albuterol (PROVENTIL HFA;VENTOLIN HFA) 108 (90 BASE) MCG/ACT inhaler Inhale 2 puffs into the lungs every 4 (four) hours as needed for wheezing or shortness of breath (or coughing).  1 Inhaler  0  . amLODipine (NORVASC) 5 MG tablet Take 5 mg by mouth daily.      . cyclobenzaprine (FLEXERIL) 10 MG tablet Take 1 tablet (10 mg total) by mouth 3 (three) times daily as needed for muscle spasms.  30 tablet  0  .  omeprazole (PRILOSEC) 20 MG capsule Take 1 capsule (20 mg total) by mouth daily.  30 capsule  11  . predniSONE (DELTASONE) 50 MG tablet Take 1 tablet (50 mg total) by mouth daily.  5 tablet  0   No current facility-administered medications on file prior to visit.    Allergies  Allergen Reactions  . Codeine     REACTION: unspecified  . Penicillins     REACTION: urticaria (hives)    Past Medical History  Diagnosis Date  . GERD (gastroesophageal reflux disease)   . Hypertension     Past Surgical History  Procedure Laterality Date  . Colonoscopy  10/16/2005    Polyps, external hemorrhoids  . Gastric restriction surgery  10/2010    sleeve- at [facility]    No family history on file.  History   Social History  . Marital Status: Married    Spouse Name: N/A    Number of Children: 1  . Years of Education: N/A   Occupational History  .     Social History Main Topics  . Smoking status: Former Research scientist (life sciences)  . Smokeless tobacco: Former Systems developer  . Alcohol Use: Yes     Comment: Occasional  . Drug Use: No  . Sexual Activity: Yes   Other Topics Concern  . Not on file   Social History Narrative  . No narrative on  file   The PMH, [place], Social History, Family History, Medications, and allergies have been reviewed in [facility], and have been updated if relevant.   Review of Systems See HPI No CP No SOB No fever No n/v/d No rashes No LE edema    Objective:    BP 138/92  Pulse 86  Temp(Src) 98.2 F (36.8 C) (Oral)  Wt 399 lb 8 oz (181.212 kg)  SpO2 98%   Physical Exam  Nursing note and vitals reviewed. Constitutional: He appears well-developed and well-nourished. No distress.  HENT:  Head: Normocephalic.  Eyes: Pupils are equal, round, and reactive to light.  Cardiovascular: Normal rate, regular rhythm and normal heart sounds.   Pulmonary/Chest: Breath sounds normal. No respiratory distress. He has no wheezes.  Skin: Skin is warm and dry.  Psychiatric: He has a normal mood and  affect. His behavior is normal. Judgment and thought content normal.          Assessment & Plan:   No diagnosis found. No Follow-up on file.

## 2013-11-11 NOTE — Assessment & Plan Note (Signed)
New- symptoms do appear to be improving. Likely viral. Finish course of prednisone, prn albuterol. Call or return to clinic prn if these symptoms worsen or fail to improve as anticipated. The patient indicates understanding of these issues and agrees with the plan.

## 2013-11-11 NOTE — Patient Instructions (Signed)
Good to see you. Finish course of prednisone.  This is likely a viral bronchitis. Call me by Friday with an update.

## 2013-11-11 NOTE — Progress Notes (Signed)
Pre visit review using our clinic review tool, if applicable. No additional management support is needed unless otherwise documented below in the visit note. 

## 2013-12-06 ENCOUNTER — Emergency Department (HOSPITAL_COMMUNITY)
Admission: EM | Admit: 2013-12-06 | Discharge: 2013-12-06 | Disposition: A | Discharge status: Home / Self Care | Payer: BC Managed Care – PPO | Attending: Emergency Medicine | Admitting: Emergency Medicine

## 2013-12-06 ENCOUNTER — Encounter (HOSPITAL_COMMUNITY): Payer: Self-pay | Admitting: Emergency Medicine

## 2013-12-06 ENCOUNTER — Emergency Department (HOSPITAL_COMMUNITY): Payer: BC Managed Care – PPO

## 2013-12-06 DIAGNOSIS — Z87891 Personal history of nicotine dependence: Secondary | ICD-10-CM | POA: Insufficient documentation

## 2013-12-06 DIAGNOSIS — Z880 Allergy status to penicillin: Secondary | ICD-10-CM | POA: Insufficient documentation

## 2013-12-06 DIAGNOSIS — K219 Gastro-esophageal reflux disease without esophagitis: Secondary | ICD-10-CM | POA: Diagnosis not present

## 2013-12-06 DIAGNOSIS — Z79899 Other long term (current) drug therapy: Secondary | ICD-10-CM | POA: Diagnosis not present

## 2013-12-06 DIAGNOSIS — I10 Essential (primary) hypertension: Secondary | ICD-10-CM | POA: Diagnosis not present

## 2013-12-06 DIAGNOSIS — J45901 Unspecified asthma with (acute) exacerbation: Secondary | ICD-10-CM | POA: Diagnosis not present

## 2013-12-06 DIAGNOSIS — J4530 Mild persistent asthma, uncomplicated: Secondary | ICD-10-CM

## 2013-12-06 DIAGNOSIS — R0981 Nasal congestion: Secondary | ICD-10-CM | POA: Diagnosis present

## 2013-12-06 MED ORDER — IPRATROPIUM-ALBUTEROL 0.5-2.5 (3) MG/3ML IN SOLN
3.0000 mL | Freq: Once | RESPIRATORY_TRACT | Status: AC
  Administered 2013-12-06: 3 mL via RESPIRATORY_TRACT
  Filled 2013-12-06: qty 3

## 2013-12-06 MED ORDER — ALBUTEROL SULFATE HFA 108 (90 BASE) MCG/ACT IN AERS: 2.0000 | INHALATION_SPRAY | RESPIRATORY_TRACT | Status: DC | PRN

## 2013-12-06 MED ORDER — PREDNISONE 20 MG PO TABS: 40.0000 mg | ORAL_TABLET | Freq: Every day | ORAL | Status: DC

## 2013-12-06 NOTE — ED Provider Notes (Signed)
CSN: [id number]     Arrival date & time 12/06/13  0423 History   First MD Initiated Contact with Patient 12/06/13 [phone]     Chief Complaint  Patient presents with  . Nasal Congestion     (Consider location/radiation/quality/duration/timing/severity/associated sxs/prior Treatment) HPI [name] [name] is a(n) 43 y.o. male who presents for cc of cough and wheezing. Patient was seen 1 month ago and diagnosed with acute bronchitis and RAD. He was given prednisone and albuterol and his sxs improved. This past Friday night the patient went out with his wife and friends and had a few beers and smoked 2 cigarettes. He has not been a regular smoker for 6 years. He states that afterward he developed the cough and wheezing and it has been persistent since. He denies chest pain, fevers, myalgias or arthralgias. His cough is worse with exertion, temperature change, and deep inhalation.   Past Medical History  Diagnosis Date  . GERD (gastroesophageal reflux disease)   . Hypertension    Past Surgical History  Procedure Laterality Date  . Colonoscopy  10/16/2005    Polyps, external hemorrhoids  . Gastric restriction surgery  10/2010    sleeve- at [facility]   No family history on file. History  Substance Use Topics  . Smoking status: Former Research scientist (life sciences)  . Smokeless tobacco: Former Systems developer  . Alcohol Use: Yes     Comment: Occasional    Review of Systems  Ten systems reviewed and are negative for acute change, except as noted in the HPI.    Allergies  Bee venom; Codeine; and Penicillins  Home Medications   Prior to Admission medications   Medication Sig Start Date End Date Taking? Authorizing Provider  acetaminophen (TYLENOL) 500 MG tablet Take 500-1,000 mg by mouth every 6 (six) hours as needed for mild pain.    Yes Historical Provider, MD  albuterol (PROVENTIL HFA;VENTOLIN HFA) 108 (90 BASE) MCG/ACT inhaler Inhale 1-2 puffs into the lungs every 6 (six) hours as needed for wheezing or shortness of  breath.   Yes Historical Provider, MD  amLODipine (NORVASC) 5 MG tablet Take 5 mg by mouth daily.   Yes Historical Provider, MD  omeprazole (PRILOSEC) 20 MG capsule Take 20 mg by mouth daily.   Yes Historical Provider, MD   BP 135/85  Pulse 70  Resp 15  SpO2 98% Physical Exam  Nursing note and vitals reviewed. Constitutional: He appears well-developed and well-nourished. No distress.  HENT:  Head: Normocephalic and atraumatic.  Eyes: Conjunctivae are normal. No scleral icterus.  Neck: Normal range of motion. Neck supple.  Cardiovascular: Normal rate, regular rhythm and normal heart sounds.   Pulmonary/Chest: Effort normal. No respiratory distress. He has wheezes (mild exiratory wheeze).  Abdominal: Soft. There is no tenderness.  Musculoskeletal: He exhibits no edema.  Neurological: He is alert.  Skin: Skin is warm and dry. He is not diaphoretic.  Psychiatric: His behavior is normal.    ED Course  Procedures (including critical care time) Labs Review Labs Reviewed - No data to display  Imaging Review No results found.   EKG Interpretation None      MDM   Final diagnoses:  RAD (reactive airway disease), mild persistent, uncomplicated    8:88 AM BP 135/85  Pulse 70  Resp 15  SpO2 98% Patient here with sxs of RAD> As he had the same 1 month ago and has a return of same sxs I will obtain a cxr to r/o CAP. Neb treatment ordered. Doubt ACS as  cause. No pain, diaphoresis, nausea.   7:04 AM BP 135/85  Pulse 70  Resp 15  SpO2 98% Patient improved after neb treatment. He will be discharged with prednisone burst and albuterol F/u with pcp. CXR negative for acute abnormality.  I personally reviewed the imaging tests through PACS system. I have reviewed and interpreted Lab values. I reviewed available ER/hospitalization records through the EMR   The patient appears reasonably screened and/or stabilized for discharge and I doubt any other medical condition or other  [facility] requiring further screening, evaluation, or treatment in the ED at this time prior to discharge.     Margarita Mail, PA-C 12/06/13 [phone]

## 2013-12-06 NOTE — ED Notes (Signed)
The pt is here to be checked for bronchitis. He has chest congestion cough and it is trickling down his throat.  He is afraid he is going to develop bronchitis

## 2013-12-06 NOTE — Discharge Instructions (Signed)

## 2013-12-06 NOTE — ED Provider Notes (Signed)
Medical screening examination/treatment/procedure(s) were performed by non-physician practitioner and as supervising physician I was immediately available for consultation/collaboration.   EKG Interpretation None       Kalman Drape, MD 12/06/13 [phone]

## 2013-12-18 ENCOUNTER — Other Ambulatory Visit: Payer: Self-pay | Admitting: *Deleted

## 2013-12-18 MED ORDER — AMLODIPINE BESYLATE 5 MG PO TABS: 5.0000 mg | ORAL_TABLET | Freq: Every day | ORAL | Status: DC

## 2014-02-07 ENCOUNTER — Emergency Department (HOSPITAL_COMMUNITY)
Admission: EM | Admit: 2014-02-07 | Discharge: 2014-02-07 | Disposition: A | Discharge status: Home / Self Care | Payer: Worker's Compensation | Attending: Emergency Medicine | Admitting: Emergency Medicine

## 2014-02-07 ENCOUNTER — Encounter (HOSPITAL_COMMUNITY): Payer: Self-pay | Admitting: Emergency Medicine

## 2014-02-07 DIAGNOSIS — Z87891 Personal history of nicotine dependence: Secondary | ICD-10-CM | POA: Insufficient documentation

## 2014-02-07 DIAGNOSIS — Y998 Other external cause status: Secondary | ICD-10-CM | POA: Diagnosis not present

## 2014-02-07 DIAGNOSIS — W460XXA Contact with hypodermic needle, initial encounter: Secondary | ICD-10-CM | POA: Diagnosis not present

## 2014-02-07 DIAGNOSIS — IMO0001 Reserved for inherently not codable concepts without codable children: Secondary | ICD-10-CM

## 2014-02-07 DIAGNOSIS — Y9389 Activity, other specified: Secondary | ICD-10-CM | POA: Diagnosis not present

## 2014-02-07 DIAGNOSIS — Z79899 Other long term (current) drug therapy: Secondary | ICD-10-CM | POA: Diagnosis not present

## 2014-02-07 DIAGNOSIS — I10 Essential (primary) hypertension: Secondary | ICD-10-CM | POA: Insufficient documentation

## 2014-02-07 DIAGNOSIS — Z7952 Long term (current) use of systemic steroids: Secondary | ICD-10-CM | POA: Diagnosis not present

## 2014-02-07 DIAGNOSIS — Z880 Allergy status to penicillin: Secondary | ICD-10-CM | POA: Insufficient documentation

## 2014-02-07 DIAGNOSIS — S61431A Puncture wound without foreign body of right hand, initial encounter: Secondary | ICD-10-CM | POA: Insufficient documentation

## 2014-02-07 DIAGNOSIS — W273XXA Contact with needle (sewing), initial encounter: Secondary | ICD-10-CM

## 2014-02-07 DIAGNOSIS — Y9289 Other specified places as the place of occurrence of the external cause: Secondary | ICD-10-CM | POA: Insufficient documentation

## 2014-02-07 DIAGNOSIS — K219 Gastro-esophageal reflux disease without esophagitis: Secondary | ICD-10-CM | POA: Insufficient documentation

## 2014-02-07 DIAGNOSIS — S61239A Puncture wound without foreign body of unspecified finger without damage to nail, initial encounter: Secondary | ICD-10-CM

## 2014-02-07 MED ORDER — RALTEGRAVIR POTASSIUM 400 MG PO TABS
400.0000 mg | ORAL_TABLET | Freq: Two times a day (BID) | ORAL | Status: DC
  Administered 2014-02-07: 400 mg via ORAL
  Filled 2014-02-07 (×4): qty 1

## 2014-02-07 MED ORDER — EMTRICITABINE-TENOFOVIR DF 200-300 MG PO TABS
1.0000 | ORAL_TABLET | Freq: Every day | ORAL | Status: DC
  Administered 2014-02-07: 1 via ORAL
  Filled 2014-02-07 (×3): qty 1

## 2014-02-07 MED ORDER — EMTRICITABINE-TENOFOVIR DF 200-300 MG PO TABS: 1.0000 | ORAL_TABLET | Freq: Every day | ORAL | Status: DC

## 2014-02-07 MED ORDER — RALTEGRAVIR POTASSIUM 400 MG PO TABS: 400.0000 mg | ORAL_TABLET | Freq: Two times a day (BID) | ORAL | Status: DC

## 2014-02-07 NOTE — ED Notes (Signed)
Wound cleaned with Betadine and Saline.

## 2014-02-07 NOTE — Discharge Instructions (Signed)
We have given you your first dose of each medication that was recommended by the Infectious Disease doctor, Dr. Tommy Medal. Take the prescriptions to the nurse at work and call the hospital for the results of your HIV and hepatitis screening. If your Hepatitis antibody level is not at a protective range you may need to have a Hepatitis B booster.
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
## 2014-02-07 NOTE — ED Provider Notes (Signed)
CSN: [id number]     Arrival date & time 02/07/14  1837 History  This chart was scribed for non-physician practitioner, Debroah Baller, PA-C,working with Maudry Diego, MD, by Marlowe Kays, ED Scribe. This patient was seen in room APFT23/APFT23 and the patient's care was started at 7:05 PM.  Chief Complaint  Patient presents with  . Puncture Wound   The history is provided by the patient. No language interpreter was used.    HPI Comments:  [name] [name] is a morbidly obese 43 y.o. male who presents to the Emergency Department complaining of being stuck with an old tattoo needle PTA. Pt states he works for [employer] at [employer] and was doing a routine search of an inmate's belongings and was stuck with a needle used for tattooing in the palm of his right hand. Pt states he was wearing puncture proof gloves but the needle penetrated them. He is unaware of the inmate's health history or who the needle may have been used on. Denies fever, chills, nausea or vomiting. Per pt, he is UTD on tetanus and Hepatitis B vaccine. Patient states that there were no medical personnel on duty to report the injury to at work today.   Past Medical History  Diagnosis Date  . GERD (gastroesophageal reflux disease)   . Hypertension    Past Surgical History  Procedure Laterality Date  . Colonoscopy  10/16/2005    Polyps, external hemorrhoids  . 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   History reviewed. No pertinent family history. History  Substance Use Topics  . Smoking status: Former Research scientist (life sciences)  . Smokeless tobacco: Former Systems developer  . Alcohol Use: Yes     Comment: Occasional    Review of Systems  Constitutional: Negative for fever and chills.  Gastrointestinal: Negative for nausea and vomiting.  Skin: Positive for wound.  All other systems reviewed and are negative.   Allergies  Bee venom; Codeine; and Penicillins  Home Medications   Prior to Admission  medications   Medication Sig Start Date End Date Taking? Authorizing Provider  amLODipine (NORVASC) 5 MG tablet Take 1 tablet (5 mg total) by mouth daily. 12/18/13  Yes Lucille Passy, MD  omeprazole (PRILOSEC) 20 MG capsule Take 20 mg by mouth daily.   Yes Historical Provider, MD  acetaminophen (TYLENOL) 500 MG tablet Take 500-1,000 mg by mouth every 6 (six) hours as needed for mild pain.     Historical Provider, MD  albuterol (PROVENTIL HFA;VENTOLIN HFA) 108 (90 BASE) MCG/ACT inhaler Inhale 2 puffs into the lungs every 2 (two) hours as needed for wheezing or shortness of breath (cough). 12/06/13   Margarita Mail, PA-C  emtricitabine-tenofovir (TRUVADA) 200-300 MG per tablet Take 1 tablet by mouth daily. 02/07/14   Anjelique Makar Bunnie Pion, NP  predniSONE (DELTASONE) 20 MG tablet Take 2 tablets (40 mg total) by mouth daily. Patient not taking: Reported on 02/07/2014 12/06/13   Margarita Mail, PA-C  raltegravir (ISENTRESS) 400 MG tablet Take 1 tablet (400 mg total) by mouth 2 (two) times daily. 02/07/14   Zoeie Ritter Bunnie Pion, NP   Triage Vitals: BP 165/91 mmHg  Pulse 86  Temp(Src) 97.8 F (36.6 C) (Oral)  Resp 24  Ht 6\' 2"  (1.88 m)  Wt 390 lb (176.903 kg)  BMI 50.05 kg/m2  SpO2 99% Physical Exam  Constitutional: He is oriented to person, place, and time. He appears well-developed and well-nourished.  Eyes: EOM are normal.  Neck: Neck  supple.  Cardiovascular:  Neurovascularly intact.  Pulmonary/Chest: Effort normal.  Abdominal: Soft. There is no tenderness.  Musculoskeletal: Normal range of motion.  Full ROM of all fingers.  Neurological: He is alert and oriented to person, place, and time. No cranial nerve deficit.  Good strength. Sensation to light touch intact.  Skin: Skin is warm and dry.  Puncture wound to right thenar area.  Nursing note and vitals reviewed.   ED Course  Procedures (including critical care time) DIAGNOSTIC STUDIES: Oxygen Saturation is 99% on RA, normal by my interpretation.    COORDINATION OF CARE: 7:08 PM- Will speak with Dr. Roderic Palau about appropriate course of treatment. Pt verbalizes understanding and agrees to plan.  7:13 PM- Spoke with Dr. Roderic Palau and he advised to call infectious disease to see about appropriate course of treatment.  7:28 PM- Per Dr. Tommy Medal, fill out occupational exposure prophylaxis for HIV order set and begin treatment.  Medications  raltegravir (ISENTRESS) tablet 400 mg (400 mg Oral Given 02/07/14 2045)  emtricitabine-tenofovir (TRUVADA) 200-300 MG per tablet 1 tablet (1 tablet Oral Given 02/07/14 2045)    Labs Review  MDM  43 y.o. male with puncture wound to the right hand while at work. Stable for discharge without other injuries. Discussed with the patient in detail pending labs, medications and follow up. Discussed with the patient precautions during the testing period such as no unprotected sex. Patient voices understanding. He will follow up with the nurse at work tomorrow.   Final diagnoses:  Needle stick injury of finger of right hand, initial encounter    I personally performed the services described in this documentation, which was scribed in my presence. The recorded information has been reviewed and is accurate.    [place], [place] 02/07/14 2112  Maudry Diego, MD 02/07/14 [phone]

## 2014-02-08 LAB — HEPATITIS B SURFACE ANTIGEN: Hepatitis B Surface Ag: NEGATIVE

## 2014-02-08 LAB — HIV ANTIBODY (ROUTINE TESTING W REFLEX): HIV: NONREACTIVE

## 2014-02-10 ENCOUNTER — Telehealth: Payer: Self-pay | Admitting: *Deleted

## 2014-02-10 NOTE — Telephone Encounter (Signed)
Pharmacy Rep from [facility] [phone] called inquiring about ICD-10 code for Isentress and Truvada.  NCM relayed ICD10 code on chart: S69.81XA

## 2014-02-11 ENCOUNTER — Telehealth: Payer: Self-pay

## 2014-02-11 NOTE — Telephone Encounter (Signed)
Lm on pts vm and informed him we do not do PA for meds prescribed by an outside provider. Advised pt he will need a hospital f/u OV, and will be given an alternate med at that time.

## 2014-02-11 NOTE — Telephone Encounter (Signed)
[facility] pharmacy left v/m; pt needs prior auth on Truvada and Isentress; pt received rxs from ED and ED will not do prior auth and ins co is requiring prior auth on both meds. Request call [phone] to give clinical info to get PA approved.Please advise.

## 2014-02-22 ENCOUNTER — Encounter: Payer: Self-pay | Admitting: Internal Medicine

## 2014-02-22 ENCOUNTER — Ambulatory Visit (INDEPENDENT_AMBULATORY_CARE_PROVIDER_SITE_OTHER): Payer: BC Managed Care – PPO | Admitting: Internal Medicine

## 2014-02-22 VITALS — BP 144/104 | HR 90 | Temp 97.7°F | Wt >= 6400 oz

## 2014-02-22 DIAGNOSIS — J0100 Acute maxillary sinusitis, unspecified: Secondary | ICD-10-CM

## 2014-02-22 MED ORDER — CEFUROXIME AXETIL 500 MG PO TABS: 500.0000 mg | ORAL_TABLET | Freq: Two times a day (BID) | ORAL | Status: DC

## 2014-02-22 NOTE — Progress Notes (Signed)
Pre visit review using our clinic review tool, if applicable. No additional management support is needed unless otherwise documented below in the visit note. 

## 2014-02-22 NOTE — Progress Notes (Signed)
HPI  Pt presents to the clinic today with c/o headache, ear fullness, nasal congestion, sore throat and cough. He reports this started 5 days ago. He is blowing dark green mucous out of his nose. He is not sure if he is running fevers but he has had sweats, chills and body aches. He has tried Sudafed OTC without much relief. He has no history of allergies or asthma. He has had sick contacts.  Review of Systems    Past Medical History  Diagnosis Date  . GERD (gastroesophageal reflux disease)   . Hypertension     History reviewed. No pertinent family history.  History   Social History  . Marital Status: Married    Spouse Name: N/A    Number of Children: 1  . Years of Education: N/A   Occupational History  .     Social History Main Topics  . Smoking status: Former Research scientist (life sciences)  . Smokeless tobacco: Former Systems developer  . Alcohol Use: Yes     Comment: Occasional  . Drug Use: No  . Sexual Activity: Yes   Other Topics Concern  . Not on file   Social History Narrative    Allergies  Allergen Reactions  . Bee Venom Anaphylaxis  . Codeine Other (See Comments)    Unknown  . Penicillins Hives     Constitutional: Positive headache, fatigueDenies fever or abrupt weight changes.  HEENT:  Positive facial pain, ear fullness, nasal congestion and sore throat. Denies eye redness, ear pain, ringing in the ears, wax buildup, runny nose or bloody nose. Respiratory: Positive cough. Denies difficulty breathing or shortness of breath.  Cardiovascular: Denies chest pain, chest tightness, palpitations or swelling in the hands or feet.   No other specific complaints in a complete review of systems (except as listed in HPI above).  Objective:  BP 144/104 mmHg  Pulse 90  Temp(Src) 97.7 F (36.5 C) (Oral)  Wt 408 lb (185.068 kg)  SpO2 98%  General: Appears his stated age, obese but in NAD. HEENT: Head: normal shape and size, left maxillary sinus tenderness noted; Eyes: sclera white, no icterus,  conjunctiva pink; Ears: Tm's pink but intact, normal light reflex, + effusion bilaterally; Nose: mucosa boggy and moist, septum midline; Throat/Mouth: + PND. Teeth present, mucosa erythematous and moist, no exudate noted, no lesions or ulcerations noted.  Neck: No adenopathy noted. Cardiovascular: Normal rate and rhythm. S1,S2 noted.  No murmur, rubs or gallops noted.  Pulmonary/Chest: Normal effort and positive vesicular breath sounds. No respiratory distress. No wheezes, rales or ronchi noted.      Assessment & Plan:   Acute maxillary sinusitis  Can use a Neti Pot which can be purchased from your local drug store. Flonase 2 sprays each nostril for 3 days and then as needed. Stop the Sudafed, this is causing your blood pressure to be elevated Ceftin BID for 10 days  RTC as needed or if symptoms persist.

## 2014-02-22 NOTE — Patient Instructions (Signed)

## 2014-04-29 ENCOUNTER — Emergency Department (HOSPITAL_COMMUNITY): Payer: BC Managed Care – PPO

## 2014-04-29 ENCOUNTER — Encounter (HOSPITAL_COMMUNITY): Payer: Self-pay | Admitting: *Deleted

## 2014-04-29 ENCOUNTER — Emergency Department (HOSPITAL_COMMUNITY)
Admission: EM | Admit: 2014-04-29 | Discharge: 2014-04-29 | Disposition: A | Discharge status: Home / Self Care | Payer: BC Managed Care – PPO | Attending: Emergency Medicine | Admitting: Emergency Medicine

## 2014-04-29 ENCOUNTER — Ambulatory Visit (INDEPENDENT_AMBULATORY_CARE_PROVIDER_SITE_OTHER): Payer: BC Managed Care – PPO | Admitting: Internal Medicine

## 2014-04-29 ENCOUNTER — Encounter: Payer: Self-pay | Admitting: Internal Medicine

## 2014-04-29 VITALS — BP 146/98 | HR 106 | Temp 98.6°F | Wt >= 6400 oz

## 2014-04-29 DIAGNOSIS — Z87891 Personal history of nicotine dependence: Secondary | ICD-10-CM | POA: Insufficient documentation

## 2014-04-29 DIAGNOSIS — R0602 Shortness of breath: Secondary | ICD-10-CM | POA: Diagnosis not present

## 2014-04-29 DIAGNOSIS — Z79899 Other long term (current) drug therapy: Secondary | ICD-10-CM | POA: Diagnosis not present

## 2014-04-29 DIAGNOSIS — R197 Diarrhea, unspecified: Secondary | ICD-10-CM

## 2014-04-29 DIAGNOSIS — R112 Nausea with vomiting, unspecified: Secondary | ICD-10-CM | POA: Diagnosis present

## 2014-04-29 DIAGNOSIS — R0981 Nasal congestion: Secondary | ICD-10-CM | POA: Diagnosis not present

## 2014-04-29 DIAGNOSIS — Z880 Allergy status to penicillin: Secondary | ICD-10-CM | POA: Insufficient documentation

## 2014-04-29 DIAGNOSIS — I10 Essential (primary) hypertension: Secondary | ICD-10-CM | POA: Diagnosis not present

## 2014-04-29 DIAGNOSIS — K529 Noninfective gastroenteritis and colitis, unspecified: Secondary | ICD-10-CM | POA: Insufficient documentation

## 2014-04-29 DIAGNOSIS — R109 Unspecified abdominal pain: Secondary | ICD-10-CM

## 2014-04-29 DIAGNOSIS — R05 Cough: Secondary | ICD-10-CM | POA: Diagnosis not present

## 2014-04-29 DIAGNOSIS — R1031 Right lower quadrant pain: Secondary | ICD-10-CM

## 2014-04-29 DIAGNOSIS — K219 Gastro-esophageal reflux disease without esophagitis: Secondary | ICD-10-CM | POA: Insufficient documentation

## 2014-04-29 LAB — CBC WITH DIFFERENTIAL/PLATELET
BASOS ABS: 0 10*3/uL (ref 0.0–0.1)
Basophils Relative: 0 % (ref 0–1)
EOS PCT: 0 % (ref 0–5)
Eosinophils Absolute: 0 10*3/uL (ref 0.0–0.7)
HEMATOCRIT: 47.8 % (ref 39.0–52.0)
Hemoglobin: 16.7 g/dL (ref 13.0–17.0)
Lymphocytes Relative: 6 % — ABNORMAL LOW (ref 12–46)
Lymphs Abs: 0.5 10*3/uL — ABNORMAL LOW (ref 0.7–4.0)
MCH: 28.8 pg (ref 26.0–34.0)
MCHC: 34.9 g/dL (ref 30.0–36.0)
MCV: 82.6 fL (ref 78.0–100.0)
MONO ABS: 0.4 10*3/uL (ref 0.1–1.0)
MONOS PCT: 5 % (ref 3–12)
NEUTROS ABS: 7.6 10*3/uL (ref 1.7–7.7)
Neutrophils Relative %: 89 % — ABNORMAL HIGH (ref 43–77)
Platelets: 166 10*3/uL (ref 150–400)
RBC: 5.79 MIL/uL (ref 4.22–5.81)
RDW: 12.5 % (ref 11.5–15.5)
WBC: 8.5 10*3/uL (ref 4.0–10.5)

## 2014-04-29 LAB — COMPREHENSIVE METABOLIC PANEL
ALBUMIN: 4.4 g/dL (ref 3.5–5.2)
ALT: 25 U/L (ref 0–53)
AST: 20 U/L (ref 0–37)
Alkaline Phosphatase: 65 U/L (ref 39–117)
Anion gap: 8 (ref 5–15)
BUN: 16 mg/dL (ref 6–23)
CALCIUM: 8.7 mg/dL (ref 8.4–10.5)
CHLORIDE: 103 mmol/L (ref 96–112)
CO2: 21 mmol/L (ref 19–32)
CREATININE: 1.18 mg/dL (ref 0.50–1.35)
GFR calc Af Amer: 86 mL/min — ABNORMAL LOW (ref >90)
GFR calc non Af Amer: 75 mL/min — ABNORMAL LOW (ref >90)
Glucose, Bld: 123 mg/dL — ABNORMAL HIGH (ref 70–99)
Potassium: 3.6 mmol/L (ref 3.5–5.1)
Sodium: 132 mmol/L — ABNORMAL LOW (ref 135–145)
Total Bilirubin: 1 mg/dL (ref 0.3–1.2)
Total Protein: 7.8 g/dL (ref 6.0–8.3)

## 2014-04-29 LAB — I-STAT TROPONIN, ED: TROPONIN I, POC: 0 ng/mL (ref 0.00–0.08)

## 2014-04-29 LAB — LIPASE, BLOOD: Lipase: 19 U/L (ref 11–59)

## 2014-04-29 MED ORDER — SODIUM CHLORIDE 0.9 % IV BOLUS (SEPSIS)
1000.0000 mL | Freq: Once | INTRAVENOUS | Status: AC
  Administered 2014-04-29: 1000 mL via INTRAVENOUS

## 2014-04-29 MED ORDER — IOHEXOL 300 MG/ML  SOLN: 25.0000 mL | INTRAMUSCULAR | Status: AC

## 2014-04-29 MED ORDER — ONDANSETRON HCL 4 MG/2ML IJ SOLN
4.0000 mg | Freq: Once | INTRAMUSCULAR | Status: AC
  Administered 2014-04-29: 4 mg via INTRAVENOUS
  Filled 2014-04-29: qty 2

## 2014-04-29 MED ORDER — IOHEXOL 300 MG/ML  SOLN
100.0000 mL | Freq: Once | INTRAMUSCULAR | Status: AC | PRN
  Administered 2014-04-29: 100 mL via INTRAVENOUS

## 2014-04-29 MED ORDER — ONDANSETRON 4 MG PO TBDP: ORAL_TABLET | ORAL | Status: DC

## 2014-04-29 NOTE — ED Notes (Signed)
Attempted IV start x1 unsuccessfully. Christina RN at bedside attempting.

## 2014-04-29 NOTE — Discharge Instructions (Signed)

## 2014-04-29 NOTE — ED Notes (Signed)
Pt returned from X-ray.  

## 2014-04-29 NOTE — ED Provider Notes (Signed)
CSN: [id number]     Arrival date & time 04/29/14  1156 History   First MD Initiated Contact with Patient 04/29/14 1316     Chief Complaint  Patient presents with  . Emesis  . Generalized Body Aches     (Consider location/radiation/quality/duration/timing/severity/associated sxs/prior Treatment) Patient is a 44 y.o. male presenting with general illness.  Illness Location:  Generalized Quality:  Malaise, nausea, vomiting, and diarrhea, achiness Severity:  Moderate Onset quality:  Gradual Duration:  2 days Timing:  Constant Progression:  Worsening Chronicity:  New Context:  Ho gastric sleeve, feels like food is stuck at times, but is tolerant of PO Relieved by:  Nothing Worsened by:  Nothing Associated symptoms: abdominal pain, congestion, cough, diarrhea, fatigue, myalgias, nausea, shortness of breath and vomiting     Past Medical History  Diagnosis Date  . GERD (gastroesophageal reflux disease)   . Hypertension    Past Surgical History  Procedure Laterality Date  . Colonoscopy  10/16/2005    Polyps, external hemorrhoids  . Gastric restriction surgery  10/2010    sleeve- at [facility]   No family history on file. History  Substance Use Topics  . Smoking status: Former Research scientist (life sciences)  . Smokeless tobacco: Former Systems developer  . Alcohol Use: Yes     Comment: Occasional    Review of Systems  Constitutional: Positive for fatigue.  HENT: Positive for congestion.   Respiratory: Positive for cough and shortness of breath.   Gastrointestinal: Positive for nausea, vomiting, abdominal pain and diarrhea.  Musculoskeletal: Positive for myalgias.  All other systems reviewed and are negative.     Allergies  Bee venom; Codeine; and Penicillins  Home Medications   Prior to Admission medications   Medication Sig Start Date End Date Taking? Authorizing Provider  acetaminophen (TYLENOL) 500 MG tablet Take 500-1,000 mg by mouth every 6 (six) hours as needed for mild pain.    Yes Historical  Provider, MD  albuterol (PROVENTIL HFA;VENTOLIN HFA) 108 (90 BASE) MCG/ACT inhaler Inhale 2 puffs into the lungs every 2 (two) hours as needed for wheezing or shortness of breath (cough). 12/06/13  Yes Margarita Mail, PA-C  amLODipine (NORVASC) 5 MG tablet Take 1 tablet (5 mg total) by mouth daily. 12/18/13  Yes Lucille Passy, MD  omeprazole (PRILOSEC) 20 MG capsule Take 20 mg by mouth daily.   Yes Historical Provider, MD  emtricitabine-tenofovir (TRUVADA) 200-300 MG per tablet Take 1 tablet by mouth daily. Patient not taking: Reported on 04/29/2014 02/07/14   Ashley Murrain, NP  ondansetron (ZOFRAN ODT) 4 MG disintegrating tablet 4mg  ODT q4 hours prn nausea/vomit 04/29/14   Debby Freiberg, MD  raltegravir (ISENTRESS) 400 MG tablet Take 1 tablet (400 mg total) by mouth 2 (two) times daily. Patient not taking: Reported on 04/29/2014 02/07/14   Ashley Murrain, NP   BP 124/88 mmHg  Pulse 95  Temp(Src) 98.6 F (37 C) (Oral)  Resp 20  Ht 6\' 2"  (1.88 m)  Wt 404 lb (183.253 kg)  BMI 51.85 kg/m2  SpO2 97% Physical Exam  Constitutional: He is oriented to person, place, and time. He appears well-developed and well-nourished.  HENT:  Head: Normocephalic and atraumatic.  Eyes: Conjunctivae and EOM are normal.  Neck: Normal range of motion. Neck supple.  Cardiovascular: Normal rate, regular rhythm and normal heart sounds.   Pulmonary/Chest: Effort normal and breath sounds normal. No respiratory distress.  Abdominal: He exhibits no distension. There is no tenderness. There is no rebound and no guarding.  Musculoskeletal:  Normal range of motion.  Neurological: He is alert and oriented to person, place, and time.  Skin: Skin is warm and dry.  Vitals reviewed.   ED Course  Procedures (including critical care time) Labs Review Labs Reviewed  CBC WITH DIFFERENTIAL/PLATELET - Abnormal; Notable for the following:    Neutrophils Relative % 89 (*)    Lymphocytes Relative 6 (*)    Lymphs Abs 0.5 (*)    All  other components within normal limits  COMPREHENSIVE METABOLIC PANEL - Abnormal; Notable for the following:    Sodium 132 (*)    Glucose, Bld 123 (*)    GFR calc non Af Amer 75 (*)    GFR calc Af Amer 86 (*)    All other components within normal limits  LIPASE, BLOOD  I-STAT TROPOININ, ED    Imaging Review Dg Chest 2 View  04/29/2014   CLINICAL DATA:  Abdominal pain, body aches  EXAM: CHEST  2 VIEW  COMPARISON:  None.  FINDINGS: Cardiomediastinal silhouette is unremarkable. No acute infiltrate or pleural effusion. No pulmonary edema. Mild degenerative changes thoracic spine.  IMPRESSION: No active cardiopulmonary disease.   Electronically Signed   By: Lahoma Crocker M.D.   On: 04/29/2014 15:05   Ct Abdomen Pelvis W Contrast  04/29/2014   CLINICAL DATA:  Nausea and diarrhea for 2-3 days, abdominal pain  EXAM: CT ABDOMEN AND PELVIS WITH CONTRAST  TECHNIQUE: Multidetector CT imaging of the abdomen and pelvis was performed using the standard protocol following bolus administration of intravenous contrast.  CONTRAST:  127mL OMNIPAQUE IOHEXOL 300 MG/ML  SOLN  COMPARISON:  None.  FINDINGS: Lung bases are free of acute infiltrate or sizable effusion.  The liver is diffusely fatty infiltrated. A gallstone is noted within the gallbladder although no wall thickening is seen. The spleen, adrenal glands and pancreas are all normal in their CT appearance. Kidneys are well visualized bilaterally and reveal renal cystic change on the right. The largest of these cysts measures 7.6 cm in greatest dimension.  The appendix is well visualized and within normal limits. The bladder is well distended. No pelvic mass lesion is seen. Very mild diverticular change is noted without diverticulitis. Some inflammatory changes are noted within the mesenteric fat with a few scattered small lymph nodes. Mild small bowel dilatation is noted without definitive transition zone or focal mass. These changes may represent some mild  gastroenteritis. The osseous structures are within normal limits. Postsurgical changes in the stomach are seen.  IMPRESSION: Changes suggestive of mild enteritis involving the small bowel  Chronic changes as described above.   Electronically Signed   By: Inez Catalina M.D.   On: 04/29/2014 17:55     EKG Interpretation   Date/Time:  Thursday April 29 2014 12:29:44 EST Ventricular Rate:  103 PR Interval:  194 QRS Duration: 96 QT Interval:  332 QTC Calculation: 434 R Axis:   -64 Text Interpretation:  Sinus tachycardia Left axis deviation Incomplete  right bundle branch block Possible Anterior infarct , age undetermined  Abnormal ECG SINCE LAST TRACING HEART RATE HAS INCREASED Confirmed by  Debby Freiberg [phone]) on 04/29/2014 2:03:11 PM      MDM   Final diagnoses:  Abdominal pain, acute  Gastroenteritis    44 y.o. male with pertinent PMH of prior gastric banding presents with n/v/d, chills, malaise, myalgias. Physical exam with LLQ tenderness, LUQ tenderness.    Wu as above unremarkable.  Ct scan reassuring.  Likely gastroenteritis.  Pt tolerated PO.  DC home with gastroenteritis precautions.   I have reviewed all laboratory and imaging studies if ordered as above  1. Gastroenteritis   2. Abdominal pain, acute         Debby Freiberg, MD 04/29/14 [phone]

## 2014-04-29 NOTE — Patient Instructions (Signed)

## 2014-04-29 NOTE — ED Notes (Signed)
Pt with hx of gastric sleeve 2002.  Since then he has had several episodes where the "food gets hung on the scar tissue" and he has upper abd discomfort and emesis.  However, this time, pt states s/s are a lot worse.  States discomfort from neck to umbilicus that radiates to shoulder blades.  Also c/o arm and leg pain.  States, "I feel like I just ran a marathon".  Emesis x 2 and diarrhea "lots".

## 2014-04-29 NOTE — Progress Notes (Signed)
Pre visit review using our clinic review tool, if applicable. No additional management support is needed unless otherwise documented below in the visit note. 

## 2014-04-29 NOTE — Progress Notes (Signed)
Subjective:    Patient ID: [name] [name], male    DOB: 02-Jul-1970, 44 y.o.   MRN: [id number]  HPI  Pt presents to the clinic today with c/o nausea, diarrhea and abdominal pain. He reports this started 2-3 days ago. He feels like he has food stuck in his the bottom of his esophagus. He has not had anything to eat since yesterday morning. He reports his stool is nothing but liquid. He is passing gas. He denies chest pain, reflux or difficulty breathing. He denies fever or chills. He had had his gastric sleeve slip before and this feels the same but also reports his wife and daughter have had similar symptoms as well.  Review of Systems  Past Medical History  Diagnosis Date  . GERD (gastroesophageal reflux disease)   . Hypertension     Current Outpatient Prescriptions  Medication Sig Dispense Refill  . acetaminophen (TYLENOL) 500 MG tablet Take 500-1,000 mg by mouth every 6 (six) hours as needed for mild pain.     Marland Kitchen albuterol (PROVENTIL HFA;VENTOLIN HFA) 108 (90 BASE) MCG/ACT inhaler Inhale 2 puffs into the lungs every 2 (two) hours as needed for wheezing or shortness of breath (cough). 1 Inhaler 0  . amLODipine (NORVASC) 5 MG tablet Take 1 tablet (5 mg total) by mouth daily. 90 tablet 1  . emtricitabine-tenofovir (TRUVADA) 200-300 MG per tablet Take 1 tablet by mouth daily. 28 tablet 0  . omeprazole (PRILOSEC) 20 MG capsule Take 20 mg by mouth daily.    . raltegravir (ISENTRESS) 400 MG tablet Take 1 tablet (400 mg total) by mouth 2 (two) times daily. 56 tablet 0   No current facility-administered medications for this visit.    Allergies  Allergen Reactions  . Bee Venom Anaphylaxis  . Codeine Other (See Comments)    Unknown  . Penicillins Hives    No family history on file.  History   Social History  . Marital Status: Married    Spouse Name: N/A  . Number of Children: 1  . Years of Education: N/A   Occupational History  .     Social History Main Topics  . Smoking  status: Former Research scientist (life sciences)  . Smokeless tobacco: Former Systems developer  . Alcohol Use: Yes     Comment: Occasional  . Drug Use: No  . Sexual Activity: Yes   Other Topics Concern  . Not on file   Social History Narrative     Constitutional: Denies fever, malaise, fatigue, headache or abrupt weight changes.  Respiratory: Denies difficulty breathing, shortness of breath, cough or sputum production.   Cardiovascular: Denies chest pain, chest tightness, palpitations or swelling in the hands or feet.  Gastrointestinal: Pt reports abdominal pain, nausea and diarrhea. Denies bloating, constipation, or blood in the stool.  GU: Denies urgency, frequency, pain with urination, burning sensation, blood in urine, odor or discharge.   No other specific complaints in a complete review of systems (except as listed in HPI above).     Objective:   Physical Exam   BP 146/98 mmHg  Pulse 106  Temp(Src) 98.6 F (37 C) (Oral)  Wt 410 lb (185.975 kg)  SpO2 98% Wt Readings from Last 3 Encounters:  04/29/14 410 lb (185.975 kg)  02/22/14 408 lb (185.068 kg)  02/07/14 390 lb (176.903 kg)    General: Appears his stated age, obese in pain but NAD. Skin: Clammy. Cardiovascular: Tachycardic with normal rhythm. S1,S2 noted.  No murmur, rubs or gallops noted.  Pulmonary/Chest: Normal effort  and positive vesicular breath sounds. No respiratory distress. No wheezes, rales or ronchi noted.  Abdomen: Soft and tender in the RLQ. Hypoactive bowel sounds, no bruits noted. Mild distention. Liver, spleen and kidneys non palpable. Neurological: Alert and oriented.    BMET    Component Value Date/Time   NA 136 10/18/2010 0810   K 3.8 10/18/2010 0810   CL 101 10/18/2010 0810   CO2 28 10/18/2010 0810   GLUCOSE 95 10/18/2010 0810   GLUCOSE 83 03/18/2006 0953   BUN 12 10/18/2010 0810   CREATININE 0.9 10/18/2010 0810   CALCIUM 8.6 10/18/2010 0810   GFRNONAA >60 05/18/2008 1637   GFRAA  05/18/2008 1637    >60        The  eGFR has been calculated using the MDRD equation. This calculation has not been validated in all clinical situations. eGFR's persistently <60 mL/min signify possible Chronic Kidney Disease.    Lipid Panel     Component Value Date/Time   CHOL 169 03/18/2006 0953   TRIG 87 03/18/2006 0953   HDL 46.8 03/18/2006 0953   CHOLHDL 3.6 CALC 03/18/2006 0953   VLDL 17 03/18/2006 0953   LDLCALC 105* 03/18/2006 0953    CBC    Component Value Date/Time   WBC 10.0 10/18/2010 0810   RBC 3.09* 10/18/2010 0810   HGB 8.9* 10/18/2010 0810   HCT 26.9* 10/18/2010 0810   PLT 313.0 10/18/2010 0810   MCV 87.2 10/18/2010 0810   MCHC 33.3 10/18/2010 0810   RDW 14.3 10/18/2010 0810   LYMPHSABS 1.4 10/18/2010 0810   MONOABS 0.8 10/18/2010 0810   EOSABS 0.1 10/18/2010 0810   BASOSABS 0.0 10/18/2010 0810    Hgb A1C No results found for: HGBA1C      Assessment & Plan:   Nausea, abdominal pain and diarrhea:  I had planned getting STAT CBC, CMET, and Lipase I had planned getting STAT CT abd with contrast- he reports their is no way that the contrast will go down He reports he feels so bad, he can't wait- he wants to go to ED He declines letting me call his parent for a ride He declines letting me call EMS to take him to the hospital  Follow up with PCP after ED visit

## 2014-04-29 NOTE — ED Notes (Signed)
Pt reports feeling frustrated, saying multiple expletives to this RN.  States "I just need to get out of here".  Explained process to patient and his rights/ability to leave AMA.  Pt says is comfortable with having CXR and CT Abdomen.  Pt repositioned, offered warm blankets and a mouth swab.  PT in NAD.

## 2014-06-15 ENCOUNTER — Ambulatory Visit (INDEPENDENT_AMBULATORY_CARE_PROVIDER_SITE_OTHER): Payer: BC Managed Care – PPO | Admitting: Family Medicine

## 2014-06-15 ENCOUNTER — Encounter: Payer: Self-pay | Admitting: Family Medicine

## 2014-06-15 VITALS — BP 144/88 | HR 77 | Temp 98.2°F | Wt >= 6400 oz

## 2014-06-15 DIAGNOSIS — J209 Acute bronchitis, unspecified: Secondary | ICD-10-CM | POA: Diagnosis not present

## 2014-06-15 MED ORDER — AZITHROMYCIN 250 MG PO TABS: ORAL_TABLET | ORAL | Status: DC

## 2014-06-15 NOTE — Progress Notes (Signed)
Subjective:   Patient ID: [name] [name], male    DOB: March 29, 1970, 44 y.o.   MRN: [id number]  [name] [name] is a pleasant 44 y.o. year old male who presents to clinic today with Headache and Nasal Congestion  on 06/15/2014  HPI:  1 week of progressive congestion.  Cough is not productive.   Did feel chest was tight- has been using his albuterol inhaler which has helped.  No fever, chills, CP or SOB.  Not smoking currently.   H/o bronchitis in past- wanted to be seen "before it went to his chest." No sinus pressure.  PMH significant for PCN allergy.  Current Outpatient Prescriptions on File Prior to Visit  Medication Sig Dispense Refill  . acetaminophen (TYLENOL) 500 MG tablet Take 500-1,000 mg by mouth every 6 (six) hours as needed for mild pain.     Marland Kitchen albuterol (PROVENTIL HFA;VENTOLIN HFA) 108 (90 BASE) MCG/ACT inhaler Inhale 2 puffs into the lungs every 2 (two) hours as needed for wheezing or shortness of breath (cough). 1 Inhaler 0  . amLODipine (NORVASC) 5 MG tablet Take 1 tablet (5 mg total) by mouth daily. 90 tablet 1  . omeprazole (PRILOSEC) 20 MG capsule Take 20 mg by mouth daily.     No current facility-administered medications on file prior to visit.    Allergies  Allergen Reactions  . Bee Venom Anaphylaxis  . Codeine Other (See Comments)    Unknown  . Penicillins Hives    Past Medical History  Diagnosis Date  . GERD (gastroesophageal reflux disease)   . Hypertension     Past Surgical History  Procedure Laterality Date  . Colonoscopy  10/16/2005    Polyps, external hemorrhoids  . Gastric restriction surgery  10/2010    sleeve- at [facility]    History reviewed. No pertinent family history.  History   Social History  . Marital Status: Married    Spouse Name: N/A  . Number of Children: 1  . Years of Education: N/A   Occupational History  .     Social History Main Topics  . Smoking status: Former Research scientist (life sciences)  . Smokeless tobacco: Former Systems developer  .  Alcohol Use: Yes     Comment: Occasional  . Drug Use: No  . Sexual Activity: Yes   Other Topics Concern  . Not on file   Social History Narrative   The PMH, PSH, Social History, Family History, Medications, and allergies have been reviewed in [facility], and have been updated if relevant.   Review of Systems  Constitutional: Negative.   HENT: Positive for congestion, postnasal drip, rhinorrhea and sneezing. Negative for ear pain, facial swelling, hearing loss, sinus pressure and trouble swallowing.   Respiratory: Positive for cough and wheezing. Negative for shortness of breath and stridor.   Cardiovascular: Negative.   Musculoskeletal: Negative.   Skin: Negative.   Psychiatric/Behavioral: Negative.   All other systems reviewed and are negative.      Objective:    BP 144/88 mmHg  Pulse 77  Temp(Src) 98.2 F (36.8 C) (Oral)  Wt 417 lb 4 oz (189.263 kg)  SpO2 97%   Physical Exam  Constitutional: He is oriented to person, place, and time. He appears well-developed and well-nourished. No distress.  HENT:  Head: Normocephalic.  Right Ear: Hearing and tympanic membrane normal.  Left Ear: Hearing and tympanic membrane normal.  Nose: Rhinorrhea and sinus tenderness present. No epistaxis.  No foreign bodies. Right sinus exhibits no maxillary sinus tenderness and no frontal sinus  tenderness. Left sinus exhibits no maxillary sinus tenderness and no frontal sinus tenderness.  Mouth/Throat: Posterior oropharyngeal erythema present. No oropharyngeal exudate.  Neck: Neck supple.  Cardiovascular: Normal rate and regular rhythm.   Pulmonary/Chest: Effort normal and breath sounds normal. No respiratory distress. He has no wheezes. He has no rales.  Musculoskeletal: He exhibits no edema.  Lymphadenopathy:    He has no cervical adenopathy.  Neurological: He is alert and oriented to person, place, and time. No cranial nerve deficit.  Skin: Skin is warm and dry.  Psychiatric: He has a normal  mood and affect. His behavior is normal. Judgment and thought content normal.  Nursing note and vitals reviewed.         Assessment & Plan:   No diagnosis found. No Follow-up on file.

## 2014-06-15 NOTE — Progress Notes (Signed)
Pre visit review using our clinic review tool, if applicable. No additional management support is needed unless otherwise documented below in the visit note. 

## 2014-06-15 NOTE — Patient Instructions (Signed)
Good to see you. Take zpack as directed if your symptoms do not improve over the next 5-7 days as expected.

## 2014-06-15 NOTE — Assessment & Plan Note (Signed)
Likely viral. Exam reassuring.  Advised continue supportive care with antihistamine, nasal steroid sprays and his rescue inhaler prn. Rx given for zpack to fill if symptoms do not improve as expected in next 5-7 days. The patient indicates understanding of these issues and agrees with the plan. Call or return to clinic prn if these symptoms worsen or fail to improve as anticipated.

## 2014-06-21 ENCOUNTER — Encounter: Payer: Self-pay | Admitting: *Deleted

## 2014-06-30 ENCOUNTER — Other Ambulatory Visit: Payer: Self-pay | Admitting: *Deleted

## 2014-06-30 MED ORDER — AMLODIPINE BESYLATE 5 MG PO TABS: 5.0000 mg | ORAL_TABLET | Freq: Every day | ORAL | Status: DC

## 2014-07-30 ENCOUNTER — Inpatient Hospital Stay (HOSPITAL_COMMUNITY)
Admission: AD | Admit: 2014-07-30 | Discharge: 2014-08-01 | DRG: 392 | Disposition: A | Discharge status: Home / Self Care | Payer: BC Managed Care – PPO | Source: Other Acute Inpatient Hospital | Attending: Internal Medicine | Admitting: Internal Medicine

## 2014-07-30 ENCOUNTER — Encounter (HOSPITAL_COMMUNITY): Payer: Self-pay | Admitting: *Deleted

## 2014-07-30 DIAGNOSIS — Z9884 Bariatric surgery status: Secondary | ICD-10-CM | POA: Diagnosis not present

## 2014-07-30 DIAGNOSIS — Z885 Allergy status to narcotic agent status: Secondary | ICD-10-CM

## 2014-07-30 DIAGNOSIS — K529 Noninfective gastroenteritis and colitis, unspecified: Secondary | ICD-10-CM | POA: Diagnosis present

## 2014-07-30 DIAGNOSIS — I10 Essential (primary) hypertension: Secondary | ICD-10-CM | POA: Diagnosis not present

## 2014-07-30 DIAGNOSIS — R112 Nausea with vomiting, unspecified: Secondary | ICD-10-CM | POA: Diagnosis not present

## 2014-07-30 DIAGNOSIS — K219 Gastro-esophageal reflux disease without esophagitis: Secondary | ICD-10-CM | POA: Diagnosis present

## 2014-07-30 DIAGNOSIS — K5660 Unspecified intestinal obstruction: Secondary | ICD-10-CM | POA: Diagnosis present

## 2014-07-30 DIAGNOSIS — Z880 Allergy status to penicillin: Secondary | ICD-10-CM | POA: Diagnosis not present

## 2014-07-30 DIAGNOSIS — Z87891 Personal history of nicotine dependence: Secondary | ICD-10-CM

## 2014-07-30 DIAGNOSIS — G4730 Sleep apnea, unspecified: Secondary | ICD-10-CM | POA: Diagnosis present

## 2014-07-30 DIAGNOSIS — R197 Diarrhea, unspecified: Secondary | ICD-10-CM | POA: Diagnosis not present

## 2014-07-30 DIAGNOSIS — R109 Unspecified abdominal pain: Secondary | ICD-10-CM | POA: Diagnosis present

## 2014-07-30 DIAGNOSIS — R1084 Generalized abdominal pain: Secondary | ICD-10-CM | POA: Diagnosis not present

## 2014-07-30 HISTORY — DX: Sleep apnea, unspecified: G47.30

## 2014-07-31 ENCOUNTER — Inpatient Hospital Stay (HOSPITAL_COMMUNITY): Payer: BC Managed Care – PPO

## 2014-07-31 ENCOUNTER — Encounter (HOSPITAL_COMMUNITY): Payer: Self-pay | Admitting: Internal Medicine

## 2014-07-31 DIAGNOSIS — R197 Diarrhea, unspecified: Secondary | ICD-10-CM

## 2014-07-31 DIAGNOSIS — I10 Essential (primary) hypertension: Secondary | ICD-10-CM | POA: Diagnosis present

## 2014-07-31 DIAGNOSIS — R112 Nausea with vomiting, unspecified: Secondary | ICD-10-CM

## 2014-07-31 DIAGNOSIS — R109 Unspecified abdominal pain: Secondary | ICD-10-CM | POA: Diagnosis present

## 2014-07-31 DIAGNOSIS — R1084 Generalized abdominal pain: Secondary | ICD-10-CM

## 2014-07-31 HISTORY — DX: Nausea with vomiting, unspecified: R11.2

## 2014-07-31 HISTORY — DX: Unspecified abdominal pain: R10.9

## 2014-07-31 LAB — COMPREHENSIVE METABOLIC PANEL
ALT: 24 U/L (ref 17–63)
AST: 21 U/L (ref 15–41)
Albumin: 3.8 g/dL (ref 3.5–5.0)
Alkaline Phosphatase: 54 U/L (ref 38–126)
Anion gap: 9 (ref 5–15)
BUN: 15 mg/dL (ref 6–20)
CO2: 23 mmol/L (ref 22–32)
CREATININE: 0.97 mg/dL (ref 0.61–1.24)
Calcium: 8.4 mg/dL — ABNORMAL LOW (ref 8.9–10.3)
Chloride: 101 mmol/L (ref 101–111)
GFR calc non Af Amer: >60 mL/min (ref >60)
GLUCOSE: 89 mg/dL (ref 65–99)
POTASSIUM: 3.4 mmol/L — AB (ref 3.5–5.1)
Sodium: 133 mmol/L — ABNORMAL LOW (ref 135–145)
Total Bilirubin: 1.1 mg/dL (ref 0.3–1.2)
Total Protein: 6.5 g/dL (ref 6.5–8.1)

## 2014-07-31 LAB — LACTIC ACID, PLASMA: LACTIC ACID, VENOUS: 1 mmol/L (ref 0.5–2.0)

## 2014-07-31 LAB — CBC WITH DIFFERENTIAL/PLATELET
BASOS ABS: 0 10*3/uL (ref 0.0–0.1)
BASOS PCT: 0 % (ref 0–1)
EOS ABS: 0.1 10*3/uL (ref 0.0–0.7)
EOS PCT: 1 % (ref 0–5)
HEMATOCRIT: 42.6 % (ref 39.0–52.0)
Hemoglobin: 14.8 g/dL (ref 13.0–17.0)
LYMPHS ABS: 1.1 10*3/uL (ref 0.7–4.0)
LYMPHS PCT: 19 % (ref 12–46)
MCH: 28.8 pg (ref 26.0–34.0)
MCHC: 34.7 g/dL (ref 30.0–36.0)
MCV: 83 fL (ref 78.0–100.0)
MONOS PCT: 10 % (ref 3–12)
Monocytes Absolute: 0.6 10*3/uL (ref 0.1–1.0)
NEUTROS ABS: 4.3 10*3/uL (ref 1.7–7.7)
Neutrophils Relative %: 70 % (ref 43–77)
Platelets: 166 10*3/uL (ref 150–400)
RBC: 5.13 MIL/uL (ref 4.22–5.81)
RDW: 12.6 % (ref 11.5–15.5)
WBC: 6.2 10*3/uL (ref 4.0–10.5)

## 2014-07-31 LAB — CLOSTRIDIUM DIFFICILE BY PCR: Toxigenic C. Difficile by PCR: NEGATIVE

## 2014-07-31 MED ORDER — ONDANSETRON HCL 4 MG/2ML IJ SOLN: 4.0000 mg | Freq: Four times a day (QID) | INTRAMUSCULAR | Status: DC | PRN

## 2014-07-31 MED ORDER — ONDANSETRON HCL 4 MG PO TABS: 4.0000 mg | ORAL_TABLET | Freq: Four times a day (QID) | ORAL | Status: DC | PRN

## 2014-07-31 MED ORDER — ACETAMINOPHEN 650 MG RE SUPP: 650.0000 mg | Freq: Four times a day (QID) | RECTAL | Status: DC | PRN

## 2014-07-31 MED ORDER — PANTOPRAZOLE SODIUM 40 MG PO TBEC
40.0000 mg | DELAYED_RELEASE_TABLET | Freq: Every day | ORAL | Status: DC
  Administered 2014-07-31 – 2014-08-01 (×2): 40 mg via ORAL
  Filled 2014-07-31 (×2): qty 1

## 2014-07-31 MED ORDER — ALUM & MAG HYDROXIDE-SIMETH 200-200-20 MG/5ML PO SUSP
15.0000 mL | Freq: Four times a day (QID) | ORAL | Status: DC | PRN
  Filled 2014-07-31: qty 30

## 2014-07-31 MED ORDER — HYDROCODONE-ACETAMINOPHEN 5-325 MG PO TABS
1.0000 | ORAL_TABLET | ORAL | Status: DC | PRN
  Administered 2014-07-31: 1 via ORAL
  Filled 2014-07-31: qty 1

## 2014-07-31 MED ORDER — POTASSIUM CHLORIDE IN NACL 20-0.9 MEQ/L-% IV SOLN
INTRAVENOUS | Status: AC
  Administered 2014-07-31: 04:00:00 via INTRAVENOUS
  Filled 2014-07-31 (×3): qty 1000

## 2014-07-31 MED ORDER — ALBUTEROL SULFATE (2.5 MG/3ML) 0.083% IN NEBU: 2.5000 mg | INHALATION_SOLUTION | RESPIRATORY_TRACT | Status: DC | PRN

## 2014-07-31 MED ORDER — IOHEXOL 300 MG/ML  SOLN
100.0000 mL | Freq: Once | INTRAMUSCULAR | Status: AC | PRN
Start: 2014-07-31 — End: 2014-07-31
  Administered 2014-07-31: 100 mL via INTRAVENOUS

## 2014-07-31 MED ORDER — ENOXAPARIN SODIUM 80 MG/0.8ML ~~LOC~~ SOLN
80.0000 mg | SUBCUTANEOUS | Status: DC
  Administered 2014-08-01: 80 mg via SUBCUTANEOUS
  Filled 2014-07-31: qty 0.8

## 2014-07-31 MED ORDER — ENOXAPARIN SODIUM 40 MG/0.4ML ~~LOC~~ SOLN
40.0000 mg | SUBCUTANEOUS | Status: DC
Start: 2014-07-31 — End: 2014-07-31
  Administered 2014-07-31: 40 mg via SUBCUTANEOUS
  Filled 2014-07-31: qty 0.4

## 2014-07-31 MED ORDER — POTASSIUM CHLORIDE CRYS ER 20 MEQ PO TBCR
40.0000 meq | EXTENDED_RELEASE_TABLET | Freq: Two times a day (BID) | ORAL | Status: AC
  Administered 2014-07-31 – 2014-08-01 (×2): 40 meq via ORAL
  Filled 2014-07-31 (×2): qty 2

## 2014-07-31 MED ORDER — AMLODIPINE BESYLATE 5 MG PO TABS
5.0000 mg | ORAL_TABLET | Freq: Every day | ORAL | Status: DC
  Administered 2014-07-31 – 2014-08-01 (×2): 5 mg via ORAL
  Filled 2014-07-31 (×2): qty 1

## 2014-07-31 MED ORDER — MORPHINE SULFATE 2 MG/ML IJ SOLN
1.0000 mg | INTRAMUSCULAR | Status: DC | PRN
  Administered 2014-07-31: 1 mg via INTRAVENOUS
  Filled 2014-07-31: qty 1

## 2014-07-31 MED ORDER — IOHEXOL 300 MG/ML  SOLN
25.0000 mL | INTRAMUSCULAR | Status: AC
  Administered 2014-07-31 (×2): 25 mL via ORAL

## 2014-07-31 MED ORDER — POTASSIUM CHLORIDE CRYS ER 20 MEQ PO TBCR
40.0000 meq | EXTENDED_RELEASE_TABLET | Freq: Two times a day (BID) | ORAL | Status: DC
Start: 2014-07-31 — End: 2014-07-31
  Administered 2014-07-31: 40 meq via ORAL
  Filled 2014-07-31: qty 2

## 2014-07-31 MED ORDER — ACETAMINOPHEN 325 MG PO TABS
650.0000 mg | ORAL_TABLET | Freq: Four times a day (QID) | ORAL | Status: DC | PRN
  Administered 2014-07-31 (×2): 650 mg via ORAL
  Filled 2014-07-31 (×2): qty 2

## 2014-07-31 NOTE — Progress Notes (Signed)
Patient admitted after midnight, please see H&P.  Await CT Scan of abd as x ray was unremarkable Tender all over on exam.  + flatus Has had issues with diarrhea/pain since surgery  [name] [name]

## 2014-07-31 NOTE — Progress Notes (Signed)
NURSING PROGRESS NOTE  Mikael Skoda [id number] Admission Data: 07/31/2014 1:20 AM Attending Provider: Phillips Grout, MD RCB:ULAGT Deborra Medina, MD Code Status: Full  [name] [name] is a 44 y.o. male patient admitted from ED:  -No acute distress noted.  -No complaints of shortness of breath.  -No complaints of chest pain.   Blood pressure 138/103, temperature 98.2 F (36.8 C), temperature source Oral, resp. rate 18, height 6\' 2"  (1.88 m), weight 183.6 kg (404 lb 12.2 oz), SpO2 99 %.   IV Fluids:  IV in place, occlusive dsg intact without redness, IV cath antecubital right, condition patent and no redness None at this time  Allergies:  Bee venom; Codeine; and Penicillins  Past Medical History:   has a past medical history of GERD (gastroesophageal reflux disease) and Hypertension.  Past Surgical History:   has past surgical history that includes Colonoscopy (10/16/2005) and Gastric restriction surgery (10/2010).  Social History:   reports that he has quit smoking. He has quit using smokeless tobacco. He reports that he drinks alcohol. He reports that he does not use illicit drugs.  Skin: intact  Patient/Family orientated to room. Information packet given to patient/family. Admission inpatient armband information verified with patient/family to include name and date of birth and placed on patient arm. Side rails up x 2, fall assessment and education completed with patient/family. Patient/family able to verbalize understanding of risk associated with falls and verbalized understanding to call for assistance before getting out of bed. Call light within reach. Patient/family able to voice and demonstrate understanding of unit orientation instructions.

## 2014-07-31 NOTE — H&P (Signed)
[facility] History and Physical  Ashe Gago ZOX:[id number] DOB: [birth year] DOA: 07/30/2014  Referring physician: Patient was transferred from [facility]. PCP: [name], MD  Specialists: None.  Chief Complaint: Diarrhea with abdominal pain.  HPI: [name] [name] is a 44 y.o. male with history of hypertension and previous history of gastric bypass who was transferred from [facility] of the patient's x-ray was showing possible partial small bowel obstruction. Patient states he started developing diarrhea on Wednesday night 3 days ago which was multiple episodes. On Thursday, the following day patient started developing generalized abdominal pain and had some nausea vomiting. He came to the ER yesterday and at the ER patient was found to have possible small bowel obstruction. Since there was no surgery available patient was transferred to [facility]. At [facility] I ordered acute abdominal series which was showing nothing acute. On exam patient's abdomen appears mildly diffusely tender. Patient denies any blood in the diarrhea or vomitus. Patient states he has had some antibiotics last month for bronchitis.   Review of Systems: As presented in the history of presenting illness, rest negative.  Past Medical History  Diagnosis Date  . GERD (gastroesophageal reflux disease)   . Hypertension   . Sleep apnea    Past Surgical History  Procedure Laterality Date  . Colonoscopy  10/16/2005    Polyps, external hemorrhoids  . Gastric restriction surgery  10/2010    sleeve- at [facility] History:  reports that he has quit smoking. He has quit using smokeless tobacco. He reports that he drinks alcohol. He reports that he does not use illicit drugs. Where does patient live home. Can patient participate in ADLs? Yes.  Allergies  Allergen Reactions  . Bee Venom Anaphylaxis  . Codeine Other (See Comments)    Unknown  . Penicillins Hives    Family History:  Family  History  Problem Relation Age of Onset  . Diabetes Mellitus II Mother   . Breast cancer Mother   . Diabetes Mellitus II Father   . CAD Father   . Stroke Father   . Cancer Father       Prior to Admission medications   Medication Sig Start Date End Date Taking? Authorizing Provider  albuterol (PROVENTIL HFA;VENTOLIN HFA) 108 (90 BASE) MCG/ACT inhaler Inhale 2 puffs into the lungs every 2 (two) hours as needed for wheezing or shortness of breath (cough). 12/06/13  Yes Margarita Mail, PA-C  amLODipine (NORVASC) 5 MG tablet Take 1 tablet (5 mg total) by mouth daily. 06/30/14  Yes Lucille Passy, MD  omeprazole (PRILOSEC) 20 MG capsule Take 20 mg by mouth daily.   Yes Historical Provider, MD  acetaminophen (TYLENOL) 500 MG tablet Take 500-1,000 mg by mouth every 6 (six) hours as needed for mild pain.     Historical Provider, MD  azithromycin (ZITHROMAX) 250 MG tablet 2 tabs by mouth on day 1 followed by 1 tab by mouth daily days 2-5 06/15/14   Lucille Passy, MD    Physical Exam: Filed Vitals:   07/30/14 2340 07/30/14 2341  BP:  138/103  Pulse:  84  Temp:  98.2 F (36.8 C)  TempSrc:  Oral  Resp:  18  Height: 6\' 2"  (1.88 m)   Weight: 183.6 kg (404 lb 12.2 oz)   SpO2:  99%     General:  Well-developed and nourished.  Eyes: Anicteric no pallor.  ENT: No discharge from the ears eyes nose and mouth.  Neck: No mass  felt.  Cardiovascular: S1 and S2 heard.  Respiratory: No rhonchi or crepitations.  Abdomen: Mild diffuse tenderness no guarding or rigidity. Bowel sounds present.  Skin: No rash.  Musculoskeletal: No edema.  Psychiatric: Appears normal.  Neurologic: Alert awake oriented to time place and person. Moves all extremities.  Labs on Admission:  Basic Metabolic Panel:  Recent Labs Lab 07/31/14 0235  NA 133*  K 3.4*  CL 101  CO2 23  GLUCOSE 89  BUN 15  CREATININE 0.97  CALCIUM 8.4*   Liver Function Tests:  Recent Labs Lab 07/31/14 0235  AST 21  ALT 24   ALKPHOS 54  BILITOT 1.1  PROT 6.5  ALBUMIN 3.8   No results for input(s): LIPASE, AMYLASE in the last 168 hours. No results for input(s): AMMONIA in the last 168 hours. CBC:  Recent Labs Lab 07/31/14 0235  WBC 6.2  NEUTROABS 4.3  HGB 14.8  HCT 42.6  MCV 83.0  PLT 166   Cardiac Enzymes: No results for input(s): CKTOTAL, CKMB, CKMBINDEX, TROPONINI in the last 168 hours.  BNP (last 3 results) No results for input(s): BNP in the last 8760 hours.  ProBNP (last 3 results) No results for input(s): PROBNP in the last 8760 hours.  CBG: No results for input(s): GLUCAP in the last 168 hours.  Radiological Exams on Admission: Dg Abd Acute W/chest  07/31/2014   CLINICAL DATA:  Acute onset of nausea and vomiting for 4 days. Diffuse abdominal pain. Initial encounter.  EXAM: DG ABDOMEN ACUTE W/ 1V CHEST  COMPARISON:  CT of the abdomen and pelvis and chest radiograph performed 04/29/2014  FINDINGS: The lungs are well-aerated and clear. There is no evidence of focal opacification, pleural effusion or pneumothorax. The cardiomediastinal silhouette is within normal limits.  The visualized bowel gas pattern is unremarkable. Scattered air is seen within the colon; there is no evidence of small bowel dilatation to suggest obstruction. No free intra-abdominal air is identified on the provided upright view.  No acute osseous abnormalities are seen; the sacroiliac joints are unremarkable in appearance.  IMPRESSION: 1. Unremarkable bowel gas pattern; no free intra-abdominal air seen. Colon largely filled with air. 2. No acute cardiopulmonary process seen.   Electronically Signed   By: Garald Balding M.D.   On: 07/31/2014 01:56    Assessment/Plan Principal Problem:   Nausea vomiting and diarrhea Active Problems:   Essential hypertension   Abdominal pain   1. Nausea vomiting and diarrhea with abdominal discomfort - x-ray at this time does not show any definite obstruction. I have ordered CT abdomen  and pelvis with contrast to check for any intra-abdominal pathology as patient still has abdominal discomfort with mild tenderness. I suspect most likely patient has colitis/gastroenteritis for which I have ordered stool studies. Continue gentle hydration and pain relieving medications. 2. Hypertension - continue home medications. 3. History of gastric bypass.  I have reviewed the chart received from [facility].   DVT Prophylaxis Lovenox.  Code Status: Full code.  Family Communication: Discussed with patient.  Disposition Plan: Admit to inpatient. Likely stay 2 days.    Shakedra Beam N. [facility] Pager [phone].  If 7PM-7AM, please contact night-coverage [email or web address] Password [email or web address] 07/31/2014, 3:30 AM

## 2014-08-01 DIAGNOSIS — I10 Essential (primary) hypertension: Secondary | ICD-10-CM

## 2014-08-01 LAB — BASIC METABOLIC PANEL
Anion gap: 4 — ABNORMAL LOW (ref 5–15)
BUN: 7 mg/dL (ref 6–20)
CO2: 23 mmol/L (ref 22–32)
Calcium: 8.1 mg/dL — ABNORMAL LOW (ref 8.9–10.3)
Chloride: 109 mmol/L (ref 101–111)
Creatinine, Ser: 0.87 mg/dL (ref 0.61–1.24)
GFR calc non Af Amer: >60 mL/min (ref >60)
Glucose, Bld: 87 mg/dL (ref 65–99)
Potassium: 4.1 mmol/L (ref 3.5–5.1)
Sodium: 136 mmol/L (ref 135–145)

## 2014-08-01 LAB — CBC
HCT: 38.6 % — ABNORMAL LOW (ref 39.0–52.0)
Hemoglobin: 13.2 g/dL (ref 13.0–17.0)
MCH: 28.5 pg (ref 26.0–34.0)
MCHC: 34.2 g/dL (ref 30.0–36.0)
MCV: 83.4 fL (ref 78.0–100.0)
Platelets: 155 10*3/uL (ref 150–400)
RBC: 4.63 MIL/uL (ref 4.22–5.81)
RDW: 12.7 % (ref 11.5–15.5)
WBC: 4 10*3/uL (ref 4.0–10.5)

## 2014-08-01 MED ORDER — ONDANSETRON HCL 4 MG PO TABS: 4.0000 mg | ORAL_TABLET | Freq: Four times a day (QID) | ORAL | Status: DC | PRN

## 2014-08-01 MED ORDER — HYDROCODONE-ACETAMINOPHEN 5-325 MG PO TABS: 1.0000 | ORAL_TABLET | ORAL | Status: DC | PRN

## 2014-08-01 NOTE — Discharge Summary (Signed)
Physician Discharge Summary  [name] [name] JJK:[id number] DOB: Nov 03, 1970 DOA: 07/30/2014  PCP: [name], MD  Admit date: 07/30/2014 Discharge date: 08/01/2014  Time spent: 35 minutes  Recommendations for Outpatient Follow-up:  1. Wash hands well when at work 2. Follow up CT scan lymph nodes  Discharge Diagnoses:  Principal Problem:   Nausea vomiting and diarrhea Active Problems:   Essential hypertension   Abdominal pain   Discharge Condition: improved  Diet recommendation: bariatric diet  Filed Weights   07/30/14 2340 07/31/14 0500 08/01/14 0500  Weight: 183.6 kg (404 lb 12.2 oz) 183.4 kg (404 lb 5.2 oz) 193.232 kg (426 lb)    History of present illness:  [name] [name] is a 45 y.o. male with history of hypertension and previous history of gastric bypass who was transferred from [facility] of the patient's x-ray was showing possible partial small bowel obstruction. Patient states he started developing diarrhea on Wednesday night 3 days ago which was multiple episodes. On Thursday, the following day patient started developing generalized abdominal pain and had some nausea vomiting. He came to the ER yesterday and at the ER patient was found to have possible small bowel obstruction. Since there was no surgery available patient was transferred to [facility]. At [facility] I ordered acute abdominal series which was showing nothing acute. On exam patient's abdomen appears mildly diffusely tender. Patient denies any blood in the diarrhea or vomitus. Patient states he has had some antibiotics last month for bronchitis.  Hospital Course:  Enteritis- ? Lymph nodes on CT scan- patient will need to follow up with PCP  Procedures:    Consultations:    Discharge Exam: Filed Vitals:   08/01/14 0500  BP: 110/54  Pulse: 71  Temp: 98.1 F (36.7 C)  Resp: 20     Discharge Instructions   Discharge Instructions    Diet - low sodium heart healthy    Complete by:  As  directed      Increase activity slowly    Complete by:  As directed           Current Discharge Medication List    START taking these medications   Details  HYDROcodone-acetaminophen (NORCO/VICODIN) 5-325 MG per tablet Take 1 tablet by mouth every 4 (four) hours as needed for moderate pain. Qty: 10 tablet, Refills: 0    ondansetron (ZOFRAN) 4 MG tablet Take 1 tablet (4 mg total) by mouth every 6 (six) hours as needed for nausea. Qty: 20 tablet, Refills: 0      CONTINUE these medications which have NOT CHANGED   Details  albuterol (PROVENTIL HFA;VENTOLIN HFA) 108 (90 BASE) MCG/ACT inhaler Inhale 2 puffs into the lungs every 2 (two) hours as needed for wheezing or shortness of breath (cough). Qty: 1 Inhaler, Refills: 0    amLODipine (NORVASC) 5 MG tablet Take 1 tablet (5 mg total) by mouth daily. Qty: 90 tablet, Refills: 0    fluticasone (FLONASE) 50 MCG/ACT nasal spray Place 2 sprays into both nostrils daily as needed for allergies or rhinitis.    omeprazole (PRILOSEC) 20 MG capsule Take 20 mg by mouth daily.    acetaminophen (TYLENOL) 500 MG tablet Take 500-1,000 mg by mouth every 6 (six) hours as needed for mild pain.        Allergies  Allergen Reactions  . Bee Venom Anaphylaxis  . Codeine Anaphylaxis  . Hornet Venom Anaphylaxis  . Penicillins Hives   Follow-up Information    Follow up with [name], MD  In 1 week.   Specialty:  Family Medicine   Contact information:   [place]  [phone]        The results of significant diagnostics from this hospitalization (including imaging, microbiology, ancillary and laboratory) are listed below for reference.    Significant Diagnostic Studies: Ct Abdomen Pelvis W Contrast  07/31/2014   CLINICAL DATA:  Diarrhea, abdominal pain. Prior gastric bypass surgery.  EXAM: CT ABDOMEN AND PELVIS WITH CONTRAST  TECHNIQUE: Multidetector CT imaging of the abdomen and pelvis was performed using the  standard protocol following bolus administration of intravenous contrast.  CONTRAST:  164mL OMNIPAQUE IOHEXOL 300 MG/ML  SOLN  COMPARISON:  04/29/2014, abdominal radiographs 07/31/2014  FINDINGS: Lower chest:  Lung bases are clear.  Hepatobiliary: Hepatic hypodensity is compatible is steatosis. Gallstone identified within the neck of the gallbladder but no other CT evidence for acute cholecystitis is identified.  Pancreas: Normal  Spleen: Normal  Adrenals/Urinary Tract: Renal cortical cysts are identified, largest right mid kidney 7.7 cm image 46. No hydroureteronephrosis. No radiopaque renal or ureteral calculus.  Stomach/Bowel: Appendix not identified. Evidence of gastric bypass without complicating feature.  Vascular/Lymphatic: Again noted is haziness of the small bowel mesentery with subjectively prominent mesenteric lymph nodes measuring up to 7-8 mm in maximal short axis diameter, unchanged. No aortic aneurysm.  Other: No free air or fluid.  Musculoskeletal: No acute osseous abnormality.  IMPRESSION: Stable haziness of the root of small bowel mesentery with subjectively prominent lymph nodes. As previously reported, this may be seen with enteritis although lymphoproliferative processes or rarely leukemia/ lymphoma may present similarly. No acute intra-abdominal or pelvic pathology otherwise.   Electronically Signed   By: Conchita Paris M.D.   On: 07/31/2014 11:41   Dg Abd Acute W/chest  07/31/2014   CLINICAL DATA:  Acute onset of nausea and vomiting for 4 days. Diffuse abdominal pain. Initial encounter.  EXAM: DG ABDOMEN ACUTE W/ 1V CHEST  COMPARISON:  CT of the abdomen and pelvis and chest radiograph performed 04/29/2014  FINDINGS: The lungs are well-aerated and clear. There is no evidence of focal opacification, pleural effusion or pneumothorax. The cardiomediastinal silhouette is within normal limits.  The visualized bowel gas pattern is unremarkable. Scattered air is seen within the colon; there is no  evidence of small bowel dilatation to suggest obstruction. No free intra-abdominal air is identified on the provided upright view.  No acute osseous abnormalities are seen; the sacroiliac joints are unremarkable in appearance.  IMPRESSION: 1. Unremarkable bowel gas pattern; no free intra-abdominal air seen. Colon largely filled with air. 2. No acute cardiopulmonary process seen.   Electronically Signed   By: Garald Balding M.D.   On: 07/31/2014 01:56    Microbiology: Recent Results (from the past 240 hour(s))  Clostridium Difficile by PCR     Status: None   Collection Time: 07/31/14  3:34 AM  Result Value Ref Range Status   C difficile by pcr NEGATIVE NEGATIVE Final     Labs: Basic Metabolic Panel:  Recent Labs Lab 07/31/14 0235 08/01/14 0502  NA 133* 136  K 3.4* 4.1  CL 101 109  CO2 23 23  GLUCOSE 89 87  BUN 15 7  CREATININE 0.97 0.87  CALCIUM 8.4* 8.1*   Liver Function Tests:  Recent Labs Lab 07/31/14 0235  AST 21  ALT 24  ALKPHOS 54  BILITOT 1.1  PROT 6.5  ALBUMIN 3.8   No results for input(s): LIPASE, AMYLASE in the last  168 hours. No results for input(s): AMMONIA in the last 168 hours. CBC:  Recent Labs Lab 07/31/14 0235 08/01/14 0502  WBC 6.2 4.0  NEUTROABS 4.3  --   HGB 14.8 13.2  HCT 42.6 38.6*  MCV 83.0 83.4  PLT 166 155   Cardiac Enzymes: No results for input(s): CKTOTAL, CKMB, CKMBINDEX, TROPONINI in the last 168 hours. BNP: BNP (last 3 results) No results for input(s): BNP in the last 8760 hours.  ProBNP (last 3 results) No results for input(s): PROBNP in the last 8760 hours.  CBG: No results for input(s): GLUCAP in the last 168 hours.     SignedEulogio Bear  [facility] 08/01/2014, 11:54 AM

## 2014-08-04 LAB — GI PATHOGEN PANEL BY PCR, STOOL
C difficile toxin A/B: NOT DETECTED
Campylobacter by PCR: NOT DETECTED
Cryptosporidium by PCR: NOT DETECTED
E COLI 0157 BY PCR: NOT DETECTED
E coli (ETEC) LT/ST: NOT DETECTED
E coli (STEC): NOT DETECTED
G lamblia by PCR: NOT DETECTED
NOROVIRUS G1/G2: NOT DETECTED
ROTAVIRUS A BY PCR: NOT DETECTED
Salmonella by PCR: NOT DETECTED
Shigella by PCR: NOT DETECTED

## 2014-10-04 ENCOUNTER — Other Ambulatory Visit: Payer: Self-pay | Admitting: Family Medicine

## 2014-10-06 ENCOUNTER — Ambulatory Visit (INDEPENDENT_AMBULATORY_CARE_PROVIDER_SITE_OTHER): Payer: BC Managed Care – PPO | Admitting: Family Medicine

## 2014-10-06 ENCOUNTER — Encounter: Payer: Self-pay | Admitting: Family Medicine

## 2014-10-06 VITALS — BP 152/94 | HR 70 | Temp 98.0°F | Wt >= 6400 oz

## 2014-10-06 DIAGNOSIS — J069 Acute upper respiratory infection, unspecified: Secondary | ICD-10-CM | POA: Diagnosis not present

## 2014-10-06 MED ORDER — AMLODIPINE BESYLATE 5 MG PO TABS: 5.0000 mg | ORAL_TABLET | Freq: Every day | ORAL | Status: DC

## 2014-10-06 NOTE — Progress Notes (Signed)
SUBJECTIVE:  [name] [name] is a 44 y.o. male who complains of sore throat, swollen glands, dry cough and hoarseness for 3 days. He denies a history of anorexia, chest pain, chills, fatigue and fevers and denies a history of asthma. Patient denies smoke cigarettes.   Current Outpatient Prescriptions on File Prior to Visit  Medication Sig Dispense Refill  . acetaminophen (TYLENOL) 500 MG tablet Take 500-1,000 mg by mouth every 6 (six) hours as needed for mild pain.     Marland Kitchen albuterol (PROVENTIL HFA;VENTOLIN HFA) 108 (90 BASE) MCG/ACT inhaler Inhale 2 puffs into the lungs every 2 (two) hours as needed for wheezing or shortness of breath (cough). 1 Inhaler 0  . fluticasone (FLONASE) 50 MCG/ACT nasal spray Place 2 sprays into both nostrils daily as needed for allergies or rhinitis.    Marland Kitchen omeprazole (PRILOSEC) 20 MG capsule Take 20 mg by mouth daily.     No current facility-administered medications on file prior to visit.    Allergies  Allergen Reactions  . Bee Venom Anaphylaxis  . Codeine Anaphylaxis  . Hornet Venom Anaphylaxis  . Penicillins Hives    Past Medical History  Diagnosis Date  . GERD (gastroesophageal reflux disease)   . Hypertension   . Sleep apnea     Past Surgical History  Procedure Laterality Date  . Colonoscopy  10/16/2005    Polyps, external hemorrhoids  . Gastric restriction surgery  10/2010    sleeve- at [facility] History  Problem Relation Age of Onset  . Diabetes Mellitus II Mother   . Breast cancer Mother   . Diabetes Mellitus II Father   . CAD Father   . Stroke Father   . Cancer Father     History   Social History  . Marital Status: Married    Spouse Name: N/A  . Number of Children: 1  . Years of Education: N/A   Occupational History  .     Social History Main Topics  . Smoking status: Former Research scientist (life sciences)  . Smokeless tobacco: Former Systems developer  . Alcohol Use: Yes     Comment: Occasional  . Drug Use: No  . Sexual Activity: Yes   Other Topics  Concern  . Not on file   Social History Narrative   The PMH, PSH, Social History, Family History, Medications, and allergies have been reviewed in [facility], and have been updated if relevant.  OBJECTIVE: BP 152/94 mmHg  Pulse 70  Temp(Src) 98 F (36.7 C) (Oral)  Wt 418 lb 4 oz (189.717 kg)  SpO2 98%  He appears well, vital signs are as noted. Ears normal.  Throat and pharynx normal.  Neck supple. No adenopathy in the neck. Nose is congested. Sinuses non tender. The chest is clear, without wheezes or rales.  ASSESSMENT:  viral upper respiratory illness  PLAN: Symptomatic therapy suggested: push fluids, rest and return office visit prn if symptoms persist or worsen. Lack of antibiotic effectiveness discussed with him. Call or return to clinic prn if these symptoms worsen or fail to improve as anticipated.

## 2014-10-06 NOTE — Progress Notes (Signed)
Pre visit review using our clinic review tool, if applicable. No additional management support is needed unless otherwise documented below in the visit note. 

## 2014-11-05 ENCOUNTER — Encounter: Payer: Self-pay | Admitting: Family Medicine

## 2014-11-05 ENCOUNTER — Ambulatory Visit (INDEPENDENT_AMBULATORY_CARE_PROVIDER_SITE_OTHER): Payer: BC Managed Care – PPO | Admitting: Family Medicine

## 2014-11-05 VITALS — BP 138/80 | HR 76 | Temp 97.8°F | Wt >= 6400 oz

## 2014-11-05 DIAGNOSIS — L989 Disorder of the skin and subcutaneous tissue, unspecified: Secondary | ICD-10-CM

## 2014-11-05 MED ORDER — DOXYCYCLINE HYCLATE 100 MG PO TABS: 100.0000 mg | ORAL_TABLET | Freq: Two times a day (BID) | ORAL | Status: DC

## 2014-11-05 NOTE — Patient Instructions (Signed)
Start doxy.  Sun caution.  Follow up as needed.   Should resolve.  Take care.  Glad to see you.

## 2014-11-05 NOTE — Progress Notes (Signed)
Pre visit review using our clinic review tool, if applicable. No additional management support is needed unless otherwise documented below in the visit note.  Lesions (2) posterior to L ear.  Noted in the last few days, both smaller now but still tender.  No lesions on/near the R ear.  No trigger, no trauma.   No FCNAVD.    Meds, vitals, and allergies reviewed.   ROS: See HPI.  Otherwise, noncontributory.  nad ncat except for small scabbed lesion behind L pinna and 2 other lesions noted- both feel like small postauricular LNs, each <1cm.  Pinna not ttp x2, canals and TMs wnl B.  Nasal and OP exam wnl Neck supple, no LA

## 2014-11-07 ENCOUNTER — Encounter (HOSPITAL_COMMUNITY): Payer: Self-pay | Admitting: *Deleted

## 2014-11-07 ENCOUNTER — Emergency Department (HOSPITAL_COMMUNITY)
Admission: EM | Admit: 2014-11-07 | Discharge: 2014-11-07 | Disposition: A | Discharge status: Home / Self Care | Payer: BC Managed Care – PPO | Attending: Emergency Medicine | Admitting: Emergency Medicine

## 2014-11-07 ENCOUNTER — Emergency Department (HOSPITAL_COMMUNITY): Payer: BC Managed Care – PPO

## 2014-11-07 DIAGNOSIS — I10 Essential (primary) hypertension: Secondary | ICD-10-CM | POA: Insufficient documentation

## 2014-11-07 DIAGNOSIS — Z79899 Other long term (current) drug therapy: Secondary | ICD-10-CM | POA: Insufficient documentation

## 2014-11-07 DIAGNOSIS — H9209 Otalgia, unspecified ear: Secondary | ICD-10-CM | POA: Diagnosis present

## 2014-11-07 DIAGNOSIS — I889 Nonspecific lymphadenitis, unspecified: Secondary | ICD-10-CM | POA: Insufficient documentation

## 2014-11-07 DIAGNOSIS — Z87891 Personal history of nicotine dependence: Secondary | ICD-10-CM | POA: Diagnosis not present

## 2014-11-07 DIAGNOSIS — L989 Disorder of the skin and subcutaneous tissue, unspecified: Secondary | ICD-10-CM

## 2014-11-07 DIAGNOSIS — Z8669 Personal history of other diseases of the nervous system and sense organs: Secondary | ICD-10-CM | POA: Insufficient documentation

## 2014-11-07 DIAGNOSIS — Z880 Allergy status to penicillin: Secondary | ICD-10-CM | POA: Insufficient documentation

## 2014-11-07 DIAGNOSIS — K219 Gastro-esophageal reflux disease without esophagitis: Secondary | ICD-10-CM | POA: Insufficient documentation

## 2014-11-07 DIAGNOSIS — L049 Acute lymphadenitis, unspecified: Secondary | ICD-10-CM

## 2014-11-07 HISTORY — DX: Disorder of the skin and subcutaneous tissue, unspecified: L98.9

## 2014-11-07 LAB — BASIC METABOLIC PANEL
Anion gap: 5 (ref 5–15)
BUN: 12 mg/dL (ref 6–20)
CO2: 28 mmol/L (ref 22–32)
Calcium: 9.1 mg/dL (ref 8.9–10.3)
Chloride: 104 mmol/L (ref 101–111)
Creatinine, Ser: 0.87 mg/dL (ref 0.61–1.24)
GFR calc Af Amer: >60 mL/min (ref >60)
GFR calc non Af Amer: >60 mL/min (ref >60)
Glucose, Bld: 106 mg/dL — ABNORMAL HIGH (ref 65–99)
Potassium: 4.1 mmol/L (ref 3.5–5.1)
SODIUM: 137 mmol/L (ref 135–145)

## 2014-11-07 LAB — CBC WITH DIFFERENTIAL/PLATELET
Basophils Absolute: 0 10*3/uL (ref 0.0–0.1)
Basophils Relative: 0 % (ref 0–1)
EOS ABS: 0 10*3/uL (ref 0.0–0.7)
Eosinophils Relative: 1 % (ref 0–5)
HCT: 43.7 % (ref 39.0–52.0)
HEMOGLOBIN: 15.2 g/dL (ref 13.0–17.0)
LYMPHS ABS: 1.1 10*3/uL (ref 0.7–4.0)
LYMPHS PCT: 25 % (ref 12–46)
MCH: 29 pg (ref 26.0–34.0)
MCHC: 34.8 g/dL (ref 30.0–36.0)
MCV: 83.4 fL (ref 78.0–100.0)
Monocytes Absolute: 0.3 10*3/uL (ref 0.1–1.0)
Monocytes Relative: 7 % (ref 3–12)
NEUTROS ABS: 3 10*3/uL (ref 1.7–7.7)
Neutrophils Relative %: 67 % (ref 43–77)
Platelets: 190 10*3/uL (ref 150–400)
RBC: 5.24 MIL/uL (ref 4.22–5.81)
RDW: 12.5 % (ref 11.5–15.5)
WBC: 4.5 10*3/uL (ref 4.0–10.5)

## 2014-11-07 LAB — I-STAT CHEM 8, ED
BUN: 17 mg/dL (ref 6–20)
CHLORIDE: 103 mmol/L (ref 101–111)
Calcium, Ion: 1.16 mmol/L (ref 1.12–1.23)
Creatinine, Ser: 0.9 mg/dL (ref 0.61–1.24)
Glucose, Bld: 99 mg/dL (ref 65–99)
HEMATOCRIT: 47 % (ref 39.0–52.0)
Hemoglobin: 16 g/dL (ref 13.0–17.0)
POTASSIUM: 4.1 mmol/L (ref 3.5–5.1)
SODIUM: 138 mmol/L (ref 135–145)
TCO2: 26 mmol/L (ref 0–100)

## 2014-11-07 MED ORDER — SODIUM CHLORIDE 0.9 % IV BOLUS (SEPSIS)
1000.0000 mL | Freq: Once | INTRAVENOUS | Status: AC
  Administered 2014-11-07: 1000 mL via INTRAVENOUS

## 2014-11-07 MED ORDER — IOHEXOL 300 MG/ML  SOLN
100.0000 mL | Freq: Once | INTRAMUSCULAR | Status: AC | PRN
  Administered 2014-11-07: 100 mL via INTRAVENOUS

## 2014-11-07 MED ORDER — OXYCODONE HCL 5 MG PO TABS: 5.0000 mg | ORAL_TABLET | ORAL | Status: DC | PRN

## 2014-11-07 MED ORDER — MORPHINE SULFATE (PF) 4 MG/ML IV SOLN
4.0000 mg | Freq: Once | INTRAVENOUS | Status: AC
  Administered 2014-11-07: 4 mg via INTRAVENOUS
  Filled 2014-11-07: qty 1

## 2014-11-07 MED ORDER — CLINDAMYCIN PHOSPHATE 900 MG/50ML IV SOLN
900.0000 mg | Freq: Once | INTRAVENOUS | Status: AC
  Administered 2014-11-07: 900 mg via INTRAVENOUS
  Filled 2014-11-07: qty 50

## 2014-11-07 MED ORDER — KETOROLAC TROMETHAMINE 30 MG/ML IJ SOLN
30.0000 mg | Freq: Once | INTRAMUSCULAR | Status: AC
  Administered 2014-11-07: 30 mg via INTRAVENOUS
  Filled 2014-11-07: qty 1

## 2014-11-07 MED ORDER — ONDANSETRON HCL 4 MG/2ML IJ SOLN
4.0000 mg | Freq: Once | INTRAMUSCULAR | Status: AC
  Administered 2014-11-07: 4 mg via INTRAVENOUS
  Filled 2014-11-07: qty 2

## 2014-11-07 MED ORDER — CLINDAMYCIN HCL 150 MG PO CAPS: 450.0000 mg | ORAL_CAPSULE | Freq: Four times a day (QID) | ORAL | Status: DC

## 2014-11-07 NOTE — ED Provider Notes (Signed)
CSN: [id number]     Arrival date & time 11/07/14  0904 History   First MD Initiated Contact with Patient 11/07/14 [phone]     Chief Complaint  Patient presents with  . Otalgia     (Consider location/radiation/quality/duration/timing/severity/associated sxs/prior Treatment) Patient is a 44 y.o. male presenting with general illness. The history is provided by the patient.  Illness Severity:  Moderate Onset quality:  Sudden Duration:  1 week Timing:  Constant Progression:  Worsening Chronicity:  New Associated symptoms: fatigue   Associated symptoms: no abdominal pain, no chest pain, no congestion, no diarrhea, no fever, no headaches, no myalgias, no rash, no shortness of breath and no vomiting    44 yo M with a chief complaint of left-sided posterior auricular swelling. This been going on for about a week. Patient saw his PCP 2 days ago was prescribed doxycycline without relief. Pain has significantly increased over the past couple days. Patient denies fevers or chills denies nausea. Patient has had some night sweats over the past couple nights. Denies ear pain. Denies prior ear pain to the swelling.  Past Medical History  Diagnosis Date  . GERD (gastroesophageal reflux disease)   . Hypertension   . Sleep apnea    Past Surgical History  Procedure Laterality Date  . Colonoscopy  10/16/2005    Polyps, external hemorrhoids  . Gastric restriction surgery  10/2010    sleeve- at [facility] History  Problem Relation Age of Onset  . Diabetes Mellitus II Mother   . Breast cancer Mother   . Diabetes Mellitus II Father   . CAD Father   . Stroke Father   . Cancer Father    Social History  Substance Use Topics  . Smoking status: Former Research scientist (life sciences)  . Smokeless tobacco: Former Systems developer  . Alcohol Use: Yes     Comment: Occasional    Review of Systems  Constitutional: Positive for fatigue. Negative for fever and chills.  HENT: Negative for congestion and facial swelling.   Eyes: Negative  for discharge and visual disturbance.  Respiratory: Negative for shortness of breath.   Cardiovascular: Negative for chest pain and palpitations.  Gastrointestinal: Negative for vomiting, abdominal pain and diarrhea.  Musculoskeletal: Negative for myalgias and arthralgias.  Skin: Positive for color change and wound. Negative for rash.  Neurological: Negative for tremors, syncope and headaches.  Psychiatric/Behavioral: Negative for confusion and dysphoric mood.      Allergies  Bee venom; Codeine; Hornet venom; and Penicillins  Home Medications   Prior to Admission medications   Medication Sig Start Date End Date Taking? Authorizing Provider  acetaminophen (TYLENOL) 500 MG tablet Take 500-1,000 mg by mouth every 6 (six) hours as needed for mild pain.     Historical Provider, MD  albuterol (PROVENTIL HFA;VENTOLIN HFA) 108 (90 BASE) MCG/ACT inhaler Inhale 2 puffs into the lungs every 2 (two) hours as needed for wheezing or shortness of breath (cough). 12/06/13   Margarita Mail, PA-C  amLODipine (NORVASC) 5 MG tablet Take 1 tablet (5 mg total) by mouth daily. 10/06/14   Lucille Passy, MD  doxycycline (VIBRA-TABS) 100 MG tablet Take 1 tablet (100 mg total) by mouth 2 (two) times daily. 11/05/14   Tonia Ghent, MD  fluticasone (FLONASE) 50 MCG/ACT nasal spray Place 2 sprays into both nostrils daily as needed for allergies or rhinitis.    Historical Provider, MD  omeprazole (PRILOSEC) 20 MG capsule Take 20 mg by mouth daily.    Historical Provider, MD  There were no vitals taken for this visit. Physical Exam  Constitutional: He is oriented to person, place, and time. He appears well-developed and well-nourished.  HENT:  Head: Normocephalic and atraumatic.    No signs of intraoral infection. Left TM without erythema or fluid. Scalp without signs of infection.  Eyes: EOM are normal. Pupils are equal, round, and reactive to light.  Neck: Normal range of motion. Neck supple. No JVD present.    Cardiovascular: Normal rate and regular rhythm.  Exam reveals no gallop and no friction rub.   No murmur heard. Pulmonary/Chest: No respiratory distress. He has no wheezes.  Abdominal: He exhibits no distension. There is no rebound and no guarding.  Musculoskeletal: Normal range of motion.  Neurological: He is alert and oriented to person, place, and time.  Skin: No rash noted. No pallor.  Psychiatric: He has a normal mood and affect. His behavior is normal.    ED Course  Procedures (including critical care time) Labs Review Labs Reviewed - No data to display  Imaging Review No results found. I have personally reviewed and evaluated these images and lab results as part of my medical decision-making.   EKG Interpretation None      MDM   Final diagnoses:  None    44 yo M with a chief complaint of of left-sided posterior auricular pain and swelling. Fluctuance with likely abscess. Extension of erythema into the lateral aspect of the neck, concern for extension into the deep space abscess. Will obtain a CT of the neck with contrast.  CT scan concerning for reactive adenitis. Radiology unable to rule out cancer. Feel unlikely with coarse disease. Change antibiotic to clindamycin. No signs of intraoral or scalp infection. Discussed results with patient given ENT follow-up. 11:49 AM:  I have discussed the diagnosis/risks/treatment options with the patient and believe the pt to be eligible for discharge home to follow-up with ENT. We also discussed returning to the ED immediately if new or worsening sx occur. We discussed the sx which are most concerning (e.g., sudden worsening symptoms, fever) that necessitate immediate return. Medications administered to the patient during their visit and any new prescriptions provided to the patient are listed below.  Medications given during this visit Medications  morphine 4 MG/ML injection 4 mg (4 mg Intravenous Given 11/07/14 0948)  ketorolac  (TORADOL) 30 MG/ML injection 30 mg (30 mg Intravenous Given 11/07/14 0947)  sodium chloride 0.9 % bolus 1,000 mL (1,000 mLs Intravenous New Bag/Given 11/07/14 0944)  ondansetron (ZOFRAN) injection 4 mg (4 mg Intravenous Given 11/07/14 0947)  clindamycin (CLEOCIN) IVPB 900 mg (0 mg Intravenous Stopped 11/07/14 1033)  iohexol (OMNIPAQUE) 300 MG/ML solution 100 mL (100 mLs Intravenous Contrast Given 11/07/14 1048)  morphine 4 MG/ML injection 4 mg (4 mg Intravenous Given 11/07/14 1115)    New Prescriptions   CLINDAMYCIN (CLEOCIN) 150 MG CAPSULE    Take 3 capsules (450 mg total) by mouth every 6 (six) hours.   OXYCODONE (ROXICODONE) 5 MG IMMEDIATE RELEASE TABLET    Take 1 tablet (5 mg total) by mouth every 4 (four) hours as needed for severe pain.     The patient appears reasonably screen and/or stabilized for discharge and I doubt any other medical condition or other [facility] requiring further screening, evaluation, or treatment in the ED at this time prior to discharge.    Deno Etienne, DO 11/07/14 1150

## 2014-11-07 NOTE — Discharge Instructions (Signed)
Cervical Adenitis °You have a swollen lymph gland in your neck. This commonly happens with Strep and virus infections, dental problems, insect bites, and injuries about the face, scalp, or neck. The lymph glands swell as the body fights the infection or heals the injury. Swelling and firmness typically lasts for several weeks after the infection or injury is healed. Rarely lymph glands can become swollen because of cancer or TB. °Antibiotics are prescribed if there is evidence of an infection. Sometimes an infected lymph gland becomes filled with pus. This condition may require opening up the abscessed gland by draining it surgically. Most of the time infected glands return to normal within two weeks. Do not poke or squeeze the swollen lymph nodes. That may keep them from shrinking back to their normal size. If the lymph gland is still swollen after 2 weeks, further medical evaluation is needed.  °SEEK IMMEDIATE MEDICAL CARE IF:  °You have difficulty swallowing or breathing, increased swelling, severe pain, or a high fever.  °Document Released: 02/19/2005 Document Revised: 05/14/2011 Document Reviewed: 08/11/2006 °[employer]® Patient Information ©2015 [employer]. This information is not intended to replace advice given to you by your health care provider. Make sure you discuss any questions you have with your health care provider. ° °

## 2014-11-07 NOTE — ED Notes (Signed)
Pt states pain behind L ear.  Small "cyst" noted.  Pt states pain is now above ear and running into his neck.

## 2014-11-07 NOTE — Assessment & Plan Note (Signed)
Could have started as a folliculitis.   Start doxy. Sun caution.  Follow up as needed.  Should resolve.  Okay for outpatient f/u.  No need for I&D given the small size of the lesion (<1cm) No spreading erythema.

## 2015-02-03 ENCOUNTER — Emergency Department (HOSPITAL_COMMUNITY)
Admission: EM | Admit: 2015-02-03 | Discharge: 2015-02-04 | Disposition: A | Discharge status: Home / Self Care | Payer: BC Managed Care – PPO | Attending: Emergency Medicine | Admitting: Emergency Medicine

## 2015-02-03 ENCOUNTER — Emergency Department (HOSPITAL_COMMUNITY): Payer: BC Managed Care – PPO

## 2015-02-03 ENCOUNTER — Encounter (HOSPITAL_COMMUNITY): Payer: Self-pay | Admitting: Neurology

## 2015-02-03 DIAGNOSIS — M25511 Pain in right shoulder: Secondary | ICD-10-CM

## 2015-02-03 DIAGNOSIS — Z8669 Personal history of other diseases of the nervous system and sense organs: Secondary | ICD-10-CM | POA: Diagnosis not present

## 2015-02-03 DIAGNOSIS — M546 Pain in thoracic spine: Secondary | ICD-10-CM | POA: Diagnosis not present

## 2015-02-03 DIAGNOSIS — K219 Gastro-esophageal reflux disease without esophagitis: Secondary | ICD-10-CM | POA: Diagnosis not present

## 2015-02-03 DIAGNOSIS — R0789 Other chest pain: Secondary | ICD-10-CM | POA: Diagnosis not present

## 2015-02-03 DIAGNOSIS — R0981 Nasal congestion: Secondary | ICD-10-CM | POA: Insufficient documentation

## 2015-02-03 DIAGNOSIS — Z87891 Personal history of nicotine dependence: Secondary | ICD-10-CM | POA: Insufficient documentation

## 2015-02-03 DIAGNOSIS — I10 Essential (primary) hypertension: Secondary | ICD-10-CM | POA: Diagnosis not present

## 2015-02-03 DIAGNOSIS — M79601 Pain in right arm: Secondary | ICD-10-CM | POA: Insufficient documentation

## 2015-02-03 DIAGNOSIS — Z79899 Other long term (current) drug therapy: Secondary | ICD-10-CM | POA: Insufficient documentation

## 2015-02-03 DIAGNOSIS — R0602 Shortness of breath: Secondary | ICD-10-CM | POA: Diagnosis not present

## 2015-02-03 DIAGNOSIS — Z880 Allergy status to penicillin: Secondary | ICD-10-CM | POA: Diagnosis not present

## 2015-02-03 DIAGNOSIS — M549 Dorsalgia, unspecified: Secondary | ICD-10-CM

## 2015-02-03 LAB — BASIC METABOLIC PANEL
Anion gap: 8 (ref 5–15)
BUN: 15 mg/dL (ref 6–20)
CHLORIDE: 105 mmol/L (ref 101–111)
CO2: 25 mmol/L (ref 22–32)
CREATININE: 1.11 mg/dL (ref 0.61–1.24)
Calcium: 9 mg/dL (ref 8.9–10.3)
GFR calc non Af Amer: >60 mL/min (ref >60)
GLUCOSE: 85 mg/dL (ref 65–99)
Potassium: 4.1 mmol/L (ref 3.5–5.1)
Sodium: 138 mmol/L (ref 135–145)

## 2015-02-03 LAB — CBC
HCT: 42.7 % (ref 39.0–52.0)
Hemoglobin: 14.3 g/dL (ref 13.0–17.0)
MCH: 28.4 pg (ref 26.0–34.0)
MCHC: 33.5 g/dL (ref 30.0–36.0)
MCV: 84.7 fL (ref 78.0–100.0)
PLATELETS: 181 10*3/uL (ref 150–400)
RBC: 5.04 MIL/uL (ref 4.22–5.81)
RDW: 12.6 % (ref 11.5–15.5)
WBC: 6.3 10*3/uL (ref 4.0–10.5)

## 2015-02-03 LAB — I-STAT TROPONIN, ED
Troponin i, poc: 0 ng/mL (ref 0.00–0.08)
Troponin i, poc: 0 ng/mL (ref 0.00–0.08)

## 2015-02-03 MED ORDER — HYDROCODONE-ACETAMINOPHEN 5-325 MG PO TABS
2.0000 | ORAL_TABLET | Freq: Once | ORAL | Status: AC
  Administered 2015-02-03: 2 via ORAL
  Filled 2015-02-03: qty 2

## 2015-02-03 MED ORDER — HYDROCODONE-ACETAMINOPHEN 5-325 MG PO TABS: 1.0000 | ORAL_TABLET | ORAL | Status: DC | PRN

## 2015-02-03 NOTE — ED Notes (Signed)
Pt reports this morning 0930 developed pain in middle back, several hours ago pain has radiated to right arm, right shoulder and shoulder blade. Now right sided cp with sob. Skin warm and dry at current. Is a x 4. In NAD

## 2015-02-03 NOTE — Discharge Instructions (Signed)
Back Pain, Adult °Back pain is very common in adults. The cause of back pain is rarely dangerous and the pain often gets better over time. The cause of your back pain may not be known. Some common causes of back pain include: °· Strain of the muscles or ligaments supporting the spine. °· Wear and tear (degeneration) of the spinal disks. °· Arthritis. °· Direct injury to the back. °For many people, back pain may return. Since back pain is rarely dangerous, most people can learn to manage this condition on their own. °HOME CARE INSTRUCTIONS °Watch your back pain for any changes. The following actions may help to lessen any discomfort you are feeling: °· Remain active. It is stressful on your back to sit or stand in one place for long periods of time. Do not sit, drive, or stand in one place for more than 30 minutes at a time. Take short walks on even surfaces as soon as you are able. Try to increase the length of time you walk each day. °· Exercise regularly as directed by your health care provider. Exercise helps your back heal faster. It also helps avoid future injury by keeping your muscles strong and flexible. °· Do not stay in bed. Resting more than 1-2 days can delay your recovery. °· Pay attention to your body when you bend and lift. The most comfortable positions are those that put less stress on your recovering back. Always use proper lifting techniques, including: °· Bending your knees. °· Keeping the load close to your body. °· Avoiding twisting. °· Find a comfortable position to sleep. Use a firm mattress and lie on your side with your knees slightly bent. If you lie on your back, put a pillow under your knees. °· Avoid feeling anxious or stressed. Stress increases muscle tension and can worsen back pain. It is important to recognize when you are anxious or stressed and learn ways to manage it, such as with exercise. °· Take medicines only as directed by your health care provider. Over-the-counter  medicines to reduce pain and inflammation are often the most helpful. Your health care provider may prescribe muscle relaxant drugs. These medicines help dull your pain so you can more quickly return to your normal activities and healthy exercise. °· Apply ice to the injured area: °· Put ice in a plastic bag. °· Place a towel between your skin and the bag. °· Leave the ice on for 20 minutes, 2-3 times a day for the first 2-3 days. After that, ice and heat may be alternated to reduce pain and spasms. °· Maintain a healthy weight. Excess weight puts extra stress on your back and makes it difficult to maintain good posture. °SEEK MEDICAL CARE IF: °· You have pain that is not relieved with rest or medicine. °· You have increasing pain going down into the legs or buttocks. °· You have pain that does not improve in one week. °· You have night pain. °· You lose weight. °· You have a fever or chills. °SEEK IMMEDIATE MEDICAL CARE IF:  °· You develop new bowel or bladder control problems. °· You have unusual weakness or numbness in your arms or legs. °· You develop nausea or vomiting. °· You develop abdominal pain. °· You feel faint. °  °This information is not intended to replace advice given to you by your health care provider. Make sure you discuss any questions you have with your health care provider. °  °Document Released: 02/19/2005 Document Revised: 03/12/2014 Document Reviewed: 06/23/2013 °Elsevier Interactive Patient Education ©2016 Else[employer]  Shoulder Pain The shoulder is the joint that connects your arms to your body. The bones that form the shoulder joint include the upper arm bone (humerus), the shoulder blade (scapula), and the collarbone (clavicle). The top of the humerus is shaped like a ball and fits into a rather flat socket on the scapula (glenoid cavity). A combination of muscles and strong, fibrous tissues that connect muscles to bones (tendons) support your shoulder joint and hold the ball in the  socket. Small, fluid-filled sacs (bursae) are located in different areas of the joint. They act as cushions between the bones and the overlying soft tissues and help reduce friction between the gliding tendons and the bone as you move your arm. Your shoulder joint allows a wide range of motion in your arm. This range of motion allows you to do things like scratch your back or throw a ball. However, this range of motion also makes your shoulder more prone to pain from overuse and injury. Causes of shoulder pain can originate from both injury and overuse and usually can be grouped in the following four categories:  Redness, swelling, and pain (inflammation) of the tendon (tendinitis) or the bursae (bursitis).  Instability, such as a dislocation of the joint.  Inflammation of the joint (arthritis).  Broken bone (fracture). HOME CARE INSTRUCTIONS   Apply ice to the sore area.  Put ice in a plastic bag.  Place a towel between your skin and the bag.  Leave the ice on for 15-20 minutes, 3-4 times per day for the first 2 days, or as directed by your health care provider.  Stop using cold packs if they do not help with the pain.  If you have a shoulder sling or immobilizer, wear it as long as your caregiver instructs. Only remove it to shower or bathe. Move your arm as little as possible, but keep your hand moving to prevent swelling.  Squeeze a soft ball or foam pad as much as possible to help prevent swelling.  Only take over-the-counter or prescription medicines for pain, discomfort, or fever as directed by your caregiver. SEEK MEDICAL CARE IF:   Your shoulder pain increases, or new pain develops in your arm, hand, or fingers.  Your hand or fingers become cold and numb.  Your pain is not relieved with medicines. SEEK IMMEDIATE MEDICAL CARE IF:   Your arm, hand, or fingers are numb or tingling.  Your arm, hand, or fingers are significantly swollen or turn white or blue. MAKE SURE YOU:    Understand these instructions.  Will watch your condition.  Will get help right away if you are not doing well or get worse.   This information is not intended to replace advice given to you by your health care provider. Make sure you discuss any questions you have with your health care provider.   Document Released: 11/29/2004 Document Revised: 03/12/2014 Document Reviewed: 06/14/2014 Elsevier Interactive Patient Education 2016 [place] therapy can help ease sore, stiff, injured, and tight muscles and joints. Heat relaxes your muscles, which may help ease your pain.  RISKS AND COMPLICATIONS If you have any of the following conditions, do not use heat therapy unless your health care provider has approved:  Poor circulation.  Healing wounds or scarred skin in the area being treated.  Diabetes, heart disease, or high blood pressure.  Not being able to feel (numbness) the area being treated.  Unusual swelling of the area being treated.  Active infections.  Blood clots.  Cancer.  Inability to communicate pain. This may include young children and people who have problems with their brain function (dementia).  Pregnancy. Heat therapy should only be used on old, pre-existing, or long-lasting (chronic) injuries. Do not use heat therapy on new injuries unless directed by your health care provider. HOW TO USE HEAT THERAPY There are several different kinds of heat therapy, including:  Moist heat pack.  Warm water bath.  Hot water bottle.  Electric heating pad.  Heated gel pack.  Heated wrap.  Electric heating pad. Use the heat therapy method suggested by your health care provider. Follow your health care provider's instructions on when and how to use heat therapy. GENERAL HEAT THERAPY RECOMMENDATIONS  Do not sleep while using heat therapy. Only use heat therapy while you are awake.  Your skin may turn pink while using heat therapy. Do not use  heat therapy if your skin turns red.  Do not use heat therapy if you have new pain.  High heat or long exposure to heat can cause burns. Be careful when using heat therapy to avoid burning your skin.  Do not use heat therapy on areas of your skin that are already irritated, such as with a rash or sunburn. SEEK MEDICAL CARE IF:  You have blisters, redness, swelling, or numbness.  You have new pain.  Your pain is worse. MAKE SURE YOU:  Understand these instructions.  Will watch your condition.  Will get help right away if you are not doing well or get worse.   This information is not intended to replace advice given to you by your health care provider. Make sure you discuss any questions you have with your health care provider.   Document Released: 05/14/2011 Document Revised: 03/12/2014 Document Reviewed: 04/14/2013 Elsevier Interactive Patient Education 2016 Elsevier Inc.  Nonspecific Chest Pain  Chest pain can be caused by many different conditions. There is always a chance that your pain could be related to something serious, such as a heart attack or a blood clot in your lungs. Chest pain can also be caused by conditions that are not life-threatening. If you have chest pain, it is very important to follow up with your health care provider. CAUSES  Chest pain can be caused by:  Heartburn.  Pneumonia or bronchitis.  Anxiety or stress.  Inflammation around your heart (pericarditis) or lung (pleuritis or pleurisy).  A blood clot in your lung.  A collapsed lung (pneumothorax). It can develop suddenly on its own (spontaneous pneumothorax) or from trauma to the chest.  Shingles infection (varicella-zoster virus).  Heart attack.  Damage to the bones, muscles, and cartilage that make up your chest wall. This can include:  Bruised bones due to injury.  Strained muscles or cartilage due to frequent or repeated coughing or overwork.  Fracture to one or more ribs.  Sore  cartilage due to inflammation (costochondritis). RISK FACTORS  Risk factors for chest pain may include:  Activities that increase your risk for trauma or injury to your chest.  Respiratory infections or conditions that cause frequent coughing.  Medical conditions or overeating that can cause heartburn.  Heart disease or family history of heart disease.  Conditions or health behaviors that increase your risk of developing a blood clot.  Having had chicken pox (varicella zoster). SIGNS AND SYMPTOMS Chest pain can feel like:  Burning or tingling on the surface of your chest or deep in your chest.  Crushing, pressure, aching, or  squeezing pain.  Dull or sharp pain that is worse when you move, cough, or take a deep breath.  Pain that is also felt in your back, neck, shoulder, or arm, or pain that spreads to any of these areas. Your chest pain may come and go, or it may stay constant. DIAGNOSIS Lab tests or other studies may be needed to find the cause of your pain. Your health care provider may have you take a test called an ambulatory ECG (electrocardiogram). An ECG records your heartbeat patterns at the time the test is performed. You may also have other tests, such as:  Transthoracic echocardiogram (TTE). During echocardiography, sound waves are used to create a picture of all of the heart structures and to look at how blood flows through your heart.  Transesophageal echocardiogram (TEE).This is a more advanced imaging test that obtains images from inside your body. It allows your health care provider to see your heart in finer detail.  Cardiac monitoring. This allows your health care provider to monitor your heart rate and rhythm in real time.  Holter monitor. This is a portable device that records your heartbeat and can help to diagnose abnormal heartbeats. It allows your health care provider to track your heart activity for several days, if needed.  Stress tests. These can be  done through exercise or by taking medicine that makes your heart beat more quickly.  Blood tests.  Imaging tests. TREATMENT  Your treatment depends on what is causing your chest pain. Treatment may include:  Medicines. These may include:  Acid blockers for heartburn.  Anti-inflammatory medicine.  Pain medicine for inflammatory conditions.  Antibiotic medicine, if an infection is present.  Medicines to dissolve blood clots.  Medicines to treat coronary artery disease.  Supportive care for conditions that do not require medicines. This may include:  Resting.  Applying heat or cold packs to injured areas.  Limiting activities until pain decreases. HOME CARE INSTRUCTIONS  If you were prescribed an antibiotic medicine, finish it all even if you start to feel better.  Avoid any activities that bring on chest pain.  Do not use any tobacco products, including cigarettes, chewing tobacco, or electronic cigarettes. If you need help quitting, ask your health care provider.  Do not drink alcohol.  Take medicines only as directed by your health care provider.  Keep all follow-up visits as directed by your health care provider. This is important. This includes any further testing if your chest pain does not go away.  If heartburn is the cause for your chest pain, you may be told to keep your head raised (elevated) while sleeping. This reduces the chance that acid will go from your stomach into your esophagus.  Make lifestyle changes as directed by your health care provider. These may include:  Getting regular exercise. Ask your health care provider to suggest some activities that are safe for you.  Eating a heart-healthy diet. A registered dietitian can help you to learn healthy eating options.  Maintaining a healthy weight.  Managing diabetes, if necessary.  Reducing stress. SEEK MEDICAL CARE IF:  Your chest pain does not go away after treatment.  You have a rash with  blisters on your chest.  You have a fever. SEEK IMMEDIATE MEDICAL CARE IF:   Your chest pain is worse.  You have an increasing cough, or you cough up blood.  You have severe abdominal pain.  You have severe weakness.  You faint.  You have chills.  You have  sudden, unexplained chest discomfort.  You have sudden, unexplained discomfort in your arms, back, neck, or jaw.  You have shortness of breath at any time.  You suddenly start to sweat, or your skin gets clammy.  You feel nauseous or you vomit.  You suddenly feel light-headed or dizzy.  Your heart begins to beat quickly, or it feels like it is skipping beats. These symptoms may represent a serious problem that is an emergency. Do not wait to see if the symptoms will go away. Get medical help right away. Call your local emergency services (911 in the [place] Do not drive yourself to the hospital.   This information is not intended to replace advice given to you by your health care provider. Make sure you discuss any questions you have with your health care provider.   Document Released: 11/29/2004 Document Revised: 03/12/2014 Document Reviewed: 09/25/2013 Elsevier Interactive Patient Education [employer].

## 2015-02-03 NOTE — ED Provider Notes (Signed)
By signing my name below, I, Eustaquio Maize, attest that this documentation has been prepared under the direction and in the presence of [employer], DO. Electronically Signed: Eustaquio Maize, ED Scribe. 02/03/2015. 11:53 PM.  TIME SEEN: 11:39 PM  CHIEF COMPLAINT: Chest pain  HPI:  [name] [name] is a 44 y.o. male with hx HTN who presents to the Emergency Department complaining of gradual onset, constant, achy, mid upper back pain that began at 9:30 AM this morning (approximately 14 hours ago), gradually worsening throughout the day. There was no injury, trauma, fall, or strenuous activity that could have caused the pain. Pt reports that at 2:30 PM (5 hours after onset) the pain began radiating to his right shoulder blade down his right arm and to his right chest. He reports that the pain is not as severe as it was earlier in the day but it is still present. The pain is exacerbated with movement of the right arm and movement. Pt also complains of shortness of breath and nasal congestion. He mentions that at one point in the day he had pain with breathing, prompting him to come to the ED. Denies fever, cough, nausea, vomiting, diarrhea, or any other associated symptoms. Pt is non smoker who quit approximately 7 years ago. No hx DVT/PE. No recent prolonged travel or surgery. No lower extremity swelling or pain. No history of recent fracture, trauma, hospitalization.  ROS: See HPI Constitutional: no fever  Eyes: no drainage  ENT: nasal congestion. no runny nose   Cardiovascular: right sided chest pain  Resp: SOB. no cough GI: no nausea, vomiting, diarrhea GU: no dysuria Integumentary: no rash  Allergy: no hives  Musculoskeletal: mid back pain, right shoulder pain, and right arm pain. no leg swelling  Neurological: no slurred speech ROS otherwise negative  PAST MEDICAL HISTORY/PAST SURGICAL HISTORY:  Past Medical History  Diagnosis Date  . GERD (gastroesophageal reflux disease)   .  Hypertension   . Sleep apnea     MEDICATIONS:  Prior to Admission medications   Medication Sig Start Date End Date Taking? Authorizing Provider  amLODipine (NORVASC) 5 MG tablet Take 1 tablet (5 mg total) by mouth daily. 10/06/14  Yes Lucille Passy, MD  omeprazole (PRILOSEC) 20 MG capsule Take 20 mg by mouth daily.   Yes Historical Provider, MD  albuterol (PROVENTIL HFA;VENTOLIN HFA) 108 (90 BASE) MCG/ACT inhaler Inhale 2 puffs into the lungs every 2 (two) hours as needed for wheezing or shortness of breath (cough). Patient not taking: Reported on 02/03/2015 12/06/13   Margarita Mail, PA-C  clindamycin (CLEOCIN) 150 MG capsule Take 3 capsules (450 mg total) by mouth every 6 (six) hours. Patient not taking: Reported on 02/03/2015 11/07/14   Deno Etienne, DO  doxycycline (VIBRA-TABS) 100 MG tablet Take 1 tablet (100 mg total) by mouth 2 (two) times daily. Patient not taking: Reported on 02/03/2015 11/05/14   Tonia Ghent, MD  oxyCODONE (ROXICODONE) 5 MG immediate release tablet Take 1 tablet (5 mg total) by mouth every 4 (four) hours as needed for severe pain. Patient not taking: Reported on 02/03/2015 11/07/14   Deno Etienne, DO    ALLERGIES:  Allergies  Allergen Reactions  . Bee Venom Anaphylaxis  . Codeine Anaphylaxis  . Hornet Venom Anaphylaxis  . Penicillins Hives    SOCIAL HISTORY:  Social History  Substance Use Topics  . Smoking status: Former Research scientist (life sciences)  . Smokeless tobacco: Former Systems developer  . Alcohol Use: Yes     Comment: Occasional  FAMILY HISTORY: Family History  Problem Relation Age of Onset  . Diabetes Mellitus II Mother   . Breast cancer Mother   . Diabetes Mellitus II Father   . CAD Father   . Stroke Father   . Cancer Father     EXAM: Triage Vitals: BP 119/70 mmHg  Pulse 73  Temp(Src) 98.3 F (36.8 C) (Oral)  Resp 15  SpO2 100%   CONSTITUTIONAL: Alert and oriented and responds appropriately to questions. Well-appearing; Obese HEAD: Normocephalic EYES: Conjunctivae  clear, PERRL ENT: normal nose; no rhinorrhea; moist mucous membranes; pharynx without lesions noted NECK: Supple, no meningismus, no LAD  CARD: RRR; S1 and S2 appreciated; no murmurs, no clicks, no rubs, no gallops RESP: Normal chest excursion without splinting or tachypnea; breath sounds clear and equal bilaterally; no wheezes, no rhonchi, no rales, no hypoxia or respiratory distress, speaking full sentences CHEST:  Patient has tenderness over his right chest wall without crepitus, ecchymosis or deformity, no rashes or lesions noted ABD/GI: Normal bowel sounds; non-distended; soft, non-tender, no rebound, no guarding, no peritoneal signs BACK:  The back appears normal and is non-tender to palpation, there is no CVA tenderness, Tight right trapezius muscle that he reports pain relief with deep palpation EXT: Full ROM of right shoulder but does reproduce his pain, no swelling, erythema, or warmth of upper extremity, 2+ right radial pulse, no bony tenderness or deformity, no joint effusion, Normal ROM in all joints; non-tender to palpation; no edema; normal capillary refill; no cyanosis, no calf tenderness or swelling   SKIN: Normal color for age and race; warm NEURO: Moves all extremities equally, sensation to light touch intact diffusely, cranial nerves II through XII intact PSYCH: The patient's mood and manner are appropriate. Grooming and personal hygiene are appropriate.  MEDICAL DECISION MAKING: Patient here with atypical chest pain, right upper thoracic pain and right shoulder pain. Suspect that this is musculoskeletal in nature. His labs are unremarkable and he has not had 2 negative troponins 1 at 7:30 PM and 1 at 11 PM. EKG shows no ischemic changes. Chest x-ray clear. He has no risk factors for pulmonary embolus. I do not think that he is having a dissection. 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 We'll  discharge with several Vicodin for pain control. Have recommended close outpatient follow-up with his primary care provider and have discussed return precautions. He verbalized understanding and is comfortable with this plan.       EKG Interpretation  Date/Time:  Thursday February 03 2015 18:12:48 EST Ventricular Rate:  79 PR Interval:  206 QRS Duration: 106 QT Interval:  388 QTC Calculation: 444 R Axis:   -28 Text Interpretation:  Normal sinus rhythm Incomplete right bundle branch block Borderline ECG No significant change since last tracing other than rate is slower Confirmed by Nyxon Strupp,  DO, Daenerys Buttram [id number]) on 02/03/2015 11:11:29 PM        I personally performed the services described in this documentation, which was scribed in my presence. The recorded information has been reviewed and is accurate.    [place], DO 02/04/15 [phone]

## 2015-02-07 ENCOUNTER — Encounter: Payer: Self-pay | Admitting: Internal Medicine

## 2015-02-07 ENCOUNTER — Ambulatory Visit (INDEPENDENT_AMBULATORY_CARE_PROVIDER_SITE_OTHER): Payer: BC Managed Care – PPO | Admitting: Internal Medicine

## 2015-02-07 VITALS — BP 146/92 | HR 76 | Temp 97.9°F | Wt >= 6400 oz

## 2015-02-07 DIAGNOSIS — R0789 Other chest pain: Secondary | ICD-10-CM

## 2015-02-07 DIAGNOSIS — R0602 Shortness of breath: Secondary | ICD-10-CM | POA: Diagnosis not present

## 2015-02-07 MED ORDER — METHOCARBAMOL 500 MG PO TABS: 500.0000 mg | ORAL_TABLET | Freq: Three times a day (TID) | ORAL | Status: DC | PRN

## 2015-02-07 NOTE — Progress Notes (Signed)
Pre visit review using our clinic review tool, if applicable. No additional management support is needed unless otherwise documented below in the visit note. 

## 2015-02-07 NOTE — Patient Instructions (Signed)

## 2015-02-07 NOTE — Progress Notes (Signed)
Subjective:    Patient ID: [name] [name], male    DOB: 1970/06/18, 44 y.o.   MRN: [id number]  HPI  Pt presents to the clinic today for ER follow up for right sided chest pain and shortness of breath. This started 1 week ago. He was seen at the ER for the same. Chest xray, ECG and Troponin's were all normal. He was diagnosed with musculoskeletal chest wall pain, and given RX for Vicodin and advised to follow up with his PCP. He never had the Vicodin filled, because his pain improved, but he reports it worsened yesterday. He describes the pain as sharp and stabbing in his right upper chest. The pain is worse with deep breaths and certain movements. He denies any injury to the area. He denies cough or left side chest pain. He does have to carry stacks of wood into the house every night for the fireplace. He denies recent travel. He does have swelling in his legs at the end of the day but it has not worsened, and he denies pain/redness of his legs. He is concerned because the pain won't go away.  Review of Systems  Past Medical History  Diagnosis Date  . GERD (gastroesophageal reflux disease)   . Hypertension   . Sleep apnea     Current Outpatient Prescriptions  Medication Sig Dispense Refill  . amLODipine (NORVASC) 5 MG tablet Take 1 tablet (5 mg total) by mouth daily. 90 tablet 3  . omeprazole (PRILOSEC) 20 MG capsule Take 20 mg by mouth daily.    Marland Kitchen HYDROcodone-acetaminophen (NORCO/VICODIN) 5-325 MG tablet Take 1 tablet by mouth every 4 (four) hours as needed. (Patient not taking: Reported on 02/07/2015) 15 tablet 0   No current facility-administered medications for this visit.    Allergies  Allergen Reactions  . Bee Venom Anaphylaxis  . Codeine Anaphylaxis  . Hornet Venom Anaphylaxis  . Penicillins Hives    Family History  Problem Relation Age of Onset  . Diabetes Mellitus II Mother   . Breast cancer Mother   . Diabetes Mellitus II Father   . CAD Father   . Stroke Father     . Cancer Father     Social History   Social History  . Marital Status: Married    Spouse Name: N/A  . Number of Children: 1  . Years of Education: N/A   Occupational History  .     Social History Main Topics  . Smoking status: Former Research scientist (life sciences)  . Smokeless tobacco: Former Systems developer  . Alcohol Use: Yes     Comment: Occasional  . Drug Use: No  . Sexual Activity: Yes   Other Topics Concern  . Not on file   Social History Narrative     Constitutional: Denies fever, malaise, fatigue, headache or abrupt weight changes.  HEENT: Denies eye pain, eye redness, ear pain, ringing in the ears, wax buildup, runny nose, nasal congestion, bloody nose, or sore throat. Respiratory: Pt reports shortness of breath with exertion. Denies difficulty breathing, cough or sputum production.   Cardiovascular: Pt reports right side chest pain. Denies chest tightness, palpitations or swelling in the hands or feet.  Gastrointestinal: Denies abdominal pain, bloating, constipation, diarrhea or blood in the stool.   No other specific complaints in a complete review of systems (except as listed in HPI above).     Objective:   Physical Exam  BP 146/92 mmHg  Pulse 76  Temp(Src) 97.9 F (36.6 C) (Oral)  Wt 431 lb (  195.5 kg)  SpO2 98% Wt Readings from Last 3 Encounters:  02/07/15 431 lb (195.5 kg)  11/07/14 424 lb (192.325 kg)  11/05/14 424 lb (192.325 kg)    General: Appears his stated age, obese in NAD. Skin: Warm, dry and intact. No rashes, lesions or ulcerations noted. Cardiovascular: Normal rate and rhythm. S1,S2 noted.  No murmur, rubs or gallops noted.  Pulmonary/Chest: Normal effort and positive vesicular breath sounds. No respiratory distress. No wheezes, rales or ronchi noted.  Musculoskeletal: Normal internal and external ROM of right shoulder. Pain not reproduced with palpation of the right chest wall. Neurological: Alert and oriented. Psychiatric: He does make eye contact and does engage.  He does seem mildly anxious today.  BMET    Component Value Date/Time   NA 138 02/03/2015 1833   K 4.1 02/03/2015 1833   CL 105 02/03/2015 1833   CO2 25 02/03/2015 1833   GLUCOSE 85 02/03/2015 1833   GLUCOSE 83 03/18/2006 0953   BUN 15 02/03/2015 1833   CREATININE 1.11 02/03/2015 1833   CALCIUM 9.0 02/03/2015 1833   GFRNONAA >60 02/03/2015 1833   GFRAA >60 02/03/2015 1833    Lipid Panel     Component Value Date/Time   CHOL 169 03/18/2006 0953   TRIG 87 03/18/2006 0953   HDL 46.8 03/18/2006 0953   CHOLHDL 3.6 CALC 03/18/2006 0953   VLDL 17 03/18/2006 0953   LDLCALC 105* 03/18/2006 0953    CBC    Component Value Date/Time   WBC 6.3 02/03/2015 1833   RBC 5.04 02/03/2015 1833   HGB 14.3 02/03/2015 1833   HCT 42.7 02/03/2015 1833   PLT 181 02/03/2015 1833   MCV 84.7 02/03/2015 1833   MCH 28.4 02/03/2015 1833   MCHC 33.5 02/03/2015 1833   RDW 12.6 02/03/2015 1833   LYMPHSABS 1.1 11/07/2014 0940   MONOABS 0.3 11/07/2014 0940   EOSABS 0.0 11/07/2014 0940   BASOSABS 0.0 11/07/2014 0940    Hgb A1C No results found for: HGBA1C       Assessment & Plan:   ER followup for right side chest wall pain:  ER notes, labs and imaging reviewed Exam benign today Advised him to fill Vicodin and start taking eRx for Robaxin TID prn Showed him some stretching exercises to help stretch the chest wall Advised him to stop hauling firewood x 1-2 weeks There is low suspicion for PE but did offer CT angio chest, but he declines today Return precautions given  RTC as needed or if symptoms persist or worsen

## 2015-02-09 ENCOUNTER — Ambulatory Visit
Admission: RE | Admit: 2015-02-09 | Discharge: 2015-02-09 | Disposition: A | Discharge status: Home / Self Care | Payer: BC Managed Care – PPO | Source: Ambulatory Visit | Attending: Internal Medicine | Admitting: Internal Medicine

## 2015-02-09 ENCOUNTER — Telehealth: Payer: Self-pay | Admitting: Family Medicine

## 2015-02-09 ENCOUNTER — Other Ambulatory Visit: Payer: Self-pay | Admitting: Internal Medicine

## 2015-02-09 DIAGNOSIS — K8020 Calculus of gallbladder without cholecystitis without obstruction: Secondary | ICD-10-CM | POA: Diagnosis not present

## 2015-02-09 DIAGNOSIS — R0602 Shortness of breath: Secondary | ICD-10-CM

## 2015-02-09 MED ORDER — IOHEXOL 350 MG/ML SOLN
150.0000 mL | Freq: Once | INTRAVENOUS | Status: AC | PRN
  Administered 2015-02-09: 110 mL via INTRAVENOUS

## 2015-02-09 NOTE — Telephone Encounter (Signed)
I would recommend a CT angio to r/o PE. If he is okay with this, I can order it.

## 2015-02-09 NOTE — Telephone Encounter (Signed)
Pt states he would like to go ahead with the referral---also per verbal order print note for work to include today and tomorrow to be excused

## 2015-02-09 NOTE — Telephone Encounter (Signed)
Pt called and is not getting much relief with the muscle relaxer and would like to know if he should come back in to be seen, be referred to CT scan, or the next possible option.  Please advise.  Best number to call is [phone] / lt

## 2015-02-09 NOTE — Telephone Encounter (Signed)
CT ordered. 

## 2015-02-10 ENCOUNTER — Ambulatory Visit (INDEPENDENT_AMBULATORY_CARE_PROVIDER_SITE_OTHER): Payer: BC Managed Care – PPO | Admitting: Family Medicine

## 2015-02-10 ENCOUNTER — Encounter: Payer: Self-pay | Admitting: Family Medicine

## 2015-02-10 ENCOUNTER — Ambulatory Visit: Payer: BC Managed Care – PPO

## 2015-02-10 VITALS — BP 142/76 | HR 86 | Temp 98.2°F | Wt >= 6400 oz

## 2015-02-10 DIAGNOSIS — K8020 Calculus of gallbladder without cholecystitis without obstruction: Secondary | ICD-10-CM | POA: Insufficient documentation

## 2015-02-10 DIAGNOSIS — R0789 Other chest pain: Secondary | ICD-10-CM | POA: Insufficient documentation

## 2015-02-10 HISTORY — DX: Calculus of gallbladder without cholecystitis without obstruction: K80.20

## 2015-02-10 HISTORY — DX: Other chest pain: R07.89

## 2015-02-10 NOTE — Progress Notes (Signed)
Subjective:   Patient ID: [name] [name], male    DOB: 07/16/1970, 44 y.o.   MRN: [id number]  [name] [name] is a pleasant 44 y.o. year old male who presents to clinic today with Follow-up  on 02/10/2015  HPI:  Was seen in ER on 02/03/15 for chest pain and SOB- notes reviewed. CXR, ECG, Troponins unremarkable. Diagnosed with MSK chest wall pain- given rx for vicodin.  Followed up in our office with Webb Silversmith, NP on 12/5- note reviewed. At that [place] he was still complaining of pain that had become acutely worse the day prior.  He had been lifting heavy stacks of wood.  eRx sent for Robaxin. CT angio ordered which was neg for PE but did show a 2.5 gallstone.  Pain has continued.  Robaxin has not helped.  Does not seem to be worsened by eating or alleviated by eating.  Does come and go- 8/10 at its worse.  Still right sided chest pain.  Dg Chest 2 View  02/03/2015  CLINICAL DATA:  44 year old male with mid back pain since 10 a.m. this morning, now with extension into the right chest, right arm and right shoulder. Slight dizziness and blurry vision. EXAM: CHEST  2 VIEW COMPARISON:  Chest x-ray 07/31/2014. FINDINGS: Lung volumes are normal. No consolidative airspace disease. No pleural effusions. No pneumothorax. No pulmonary nodule or mass noted. Pulmonary vasculature and the cardiomediastinal silhouette are within normal limits. IMPRESSION: No radiographic evidence of acute cardiopulmonary disease. Electronically Signed   By: Vinnie Langton M.D.   On: 02/03/2015 18:50   Ct Angio Chest W/cm &/or Wo Cm  02/09/2015  CLINICAL DATA:  Short of breath for 1 week, upper back and right chest pain EXAM: CT ANGIOGRAPHY CHEST WITH CONTRAST TECHNIQUE: Multidetector CT imaging of the chest was performed using the standard protocol during bolus administration of intravenous contrast. Multiplanar CT image reconstructions and MIPs were obtained to evaluate the vascular anatomy. CONTRAST:  170mL OMNIPAQUE  IOHEXOL 350 MG/ML SOLN COMPARISON:  Chest x-ray of 02/03/2015 FINDINGS: The pulmonary arteries are moderately well opacified. There is no evidence of acute pulmonary embolism. The thoracic aorta also opacifies well with no acute abnormality noted. The origins of the great vessels are patent. No mediastinal or hilar adenopathy is seen. There is prominent fat in the anterior mediastinum. The heart is within upper limits of normal. No pericardial effusion is seen. There is a calcified gallstone within the gallbladder of approximately 2.5 cm. No gallbladder wall thickening is seen. Also there is a question of the liver being somewhat low in attenuation difficult to assess on this study but possibly indicating fatty infiltration. Correlate with liver function tests. 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 On lung window images, no parenchymal infiltrate is seen. There is no evidence of pleural effusion. A bleb is noted within the left lower lobe. No suspicious pulmonary nodule is noted. Mild degenerative change is present in the lower thoracic spine. The thoracic vertebrae are in normal alignment. Review of the MIP images confirms the above findings. IMPRESSION: 1. No evidence of acute pulmonary embolism. 2. No mediastinal or hilar adenopathy. 3. No parenchymal infiltrate.  No pleural effusion. 4. 2.5 cm gall stone within the gallbladder. 5. Somewhat low-attenuation of the liver may indicate fatty infiltration. Correlate with LFTs. Electronically Signed   By: Ivar Drape M.D.   On: 02/09/2015 14:04   Current Outpatient Prescriptions on File Prior to Visit  Medication Sig Dispense Refill  . amLODipine (NORVASC) 5  MG tablet Take 1 tablet (5 mg total) by mouth daily. 90 tablet 3  . HYDROcodone-acetaminophen (NORCO/VICODIN) 5-325 MG tablet Take 1 tablet by mouth every 4 (four) hours as needed. 15 tablet 0  . methocarbamol (ROBAXIN) 500 MG tablet Take 1 tablet (500 mg total) by mouth every 8 (eight) hours  as needed for muscle spasms. 30 tablet 0  . omeprazole (PRILOSEC) 20 MG capsule Take 20 mg by mouth daily.     No current facility-administered medications on file prior to visit.    Allergies  Allergen Reactions  . Bee Venom Anaphylaxis  . Codeine Anaphylaxis  . Hornet Venom Anaphylaxis  . Penicillins Hives    Past Medical History  Diagnosis Date  . GERD (gastroesophageal reflux disease)   . Hypertension   . Sleep apnea     Past Surgical History  Procedure Laterality Date  . Colonoscopy  10/16/2005    Polyps, external hemorrhoids  . Gastric restriction surgery  10/2010    sleeve- at [facility] History  Problem Relation Age of Onset  . Diabetes Mellitus II Mother   . Breast cancer Mother   . Diabetes Mellitus II Father   . CAD Father   . Stroke Father   . Cancer Father     Social History   Social History  . Marital Status: Married    Spouse Name: N/A  . Number of Children: 1  . Years of Education: N/A   Occupational History  .     Social History Main Topics  . Smoking status: Former Research scientist (life sciences)  . Smokeless tobacco: Former Systems developer  . Alcohol Use: Yes     Comment: Occasional  . Drug Use: No  . Sexual Activity: Yes   Other Topics Concern  . Not on file   Social History Narrative   The PMH, PSH, Social History, Family History, Medications, and allergies have been reviewed in [facility], and have been updated if relevant.   Review of Systems  Constitutional: Negative.   Respiratory: Negative.   Cardiovascular: Negative.   Gastrointestinal: Positive for abdominal pain. Negative for nausea, vomiting, diarrhea, constipation, blood in stool, abdominal distention, anal bleeding and rectal pain.  Genitourinary: Negative.   Musculoskeletal: Negative.   Skin: Negative.   Neurological: Negative.   Hematological: Negative.   Psychiatric/Behavioral: Negative.   All other systems reviewed and are negative.      Objective:    BP 142/76 mmHg  Pulse 86  Temp(Src)  98.2 F (36.8 C) (Oral)  Wt 434 lb 12 oz (197.201 kg)  SpO2 97%   Physical Exam  Constitutional: He is oriented to person, place, and time. He appears well-developed and well-nourished. No distress.  HENT:  Head: Normocephalic.  Eyes: Conjunctivae are normal.  Cardiovascular: Normal rate.   Pulmonary/Chest: Effort normal.  Abdominal: Soft. He exhibits no distension and no mass. There is tenderness. There is no rebound and no guarding.  Neurological: He is alert and oriented to person, place, and time. No cranial nerve deficit.  Skin: Skin is warm and dry. He is not diaphoretic.  Psychiatric: He has a normal mood and affect. His behavior is normal. Thought content normal.  Nursing note and vitals reviewed.         Assessment & Plan:   Gallstone  Chest wall pain No Follow-up on file.

## 2015-02-10 NOTE — Patient Instructions (Signed)
Great to see you. Please stop by to see Marion on your way out.   

## 2015-02-10 NOTE — Assessment & Plan Note (Signed)
Persistent pain. Gallstone is quite large and I am concerned that this may be contributing to his pain.  Does not have classic symptoms of biliary colic but certainly could still be contributory. Refer to general surgeon. The patient indicates understanding of these issues and agrees with the plan. Orders Placed This Encounter  Procedures  . Ambulatory referral to General Surgery

## 2015-02-10 NOTE — Progress Notes (Signed)
Pre visit review using our clinic review tool, if applicable. No additional management support is needed unless otherwise documented below in the visit note. 

## 2015-02-13 ENCOUNTER — Emergency Department (HOSPITAL_COMMUNITY): Payer: BC Managed Care – PPO

## 2015-02-13 ENCOUNTER — Observation Stay (HOSPITAL_COMMUNITY): Payer: BC Managed Care – PPO

## 2015-02-13 ENCOUNTER — Observation Stay (HOSPITAL_COMMUNITY)
Admission: EM | Admit: 2015-02-13 | Discharge: 2015-02-15 | Disposition: A | Discharge status: Home / Self Care | Payer: BC Managed Care – PPO | Attending: General Surgery | Admitting: General Surgery

## 2015-02-13 ENCOUNTER — Encounter (HOSPITAL_COMMUNITY): Payer: Self-pay | Admitting: *Deleted

## 2015-02-13 DIAGNOSIS — Z6841 Body Mass Index (BMI) 40.0 and over, adult: Secondary | ICD-10-CM | POA: Insufficient documentation

## 2015-02-13 DIAGNOSIS — Z91030 Bee allergy status: Secondary | ICD-10-CM | POA: Insufficient documentation

## 2015-02-13 DIAGNOSIS — K8020 Calculus of gallbladder without cholecystitis without obstruction: Secondary | ICD-10-CM | POA: Diagnosis present

## 2015-02-13 DIAGNOSIS — F419 Anxiety disorder, unspecified: Secondary | ICD-10-CM | POA: Insufficient documentation

## 2015-02-13 DIAGNOSIS — Z9884 Bariatric surgery status: Secondary | ICD-10-CM | POA: Insufficient documentation

## 2015-02-13 DIAGNOSIS — Z86010 Personal history of colonic polyps: Secondary | ICD-10-CM | POA: Insufficient documentation

## 2015-02-13 DIAGNOSIS — I10 Essential (primary) hypertension: Secondary | ICD-10-CM | POA: Insufficient documentation

## 2015-02-13 DIAGNOSIS — K8000 Calculus of gallbladder with acute cholecystitis without obstruction: Secondary | ICD-10-CM | POA: Diagnosis present

## 2015-02-13 DIAGNOSIS — R1011 Right upper quadrant pain: Secondary | ICD-10-CM | POA: Diagnosis present

## 2015-02-13 DIAGNOSIS — K219 Gastro-esophageal reflux disease without esophagitis: Secondary | ICD-10-CM | POA: Diagnosis not present

## 2015-02-13 DIAGNOSIS — Z87891 Personal history of nicotine dependence: Secondary | ICD-10-CM | POA: Insufficient documentation

## 2015-02-13 DIAGNOSIS — F909 Attention-deficit hyperactivity disorder, unspecified type: Secondary | ICD-10-CM | POA: Insufficient documentation

## 2015-02-13 DIAGNOSIS — Z885 Allergy status to narcotic agent status: Secondary | ICD-10-CM | POA: Diagnosis not present

## 2015-02-13 DIAGNOSIS — Z880 Allergy status to penicillin: Secondary | ICD-10-CM | POA: Diagnosis not present

## 2015-02-13 DIAGNOSIS — K8050 Calculus of bile duct without cholangitis or cholecystitis without obstruction: Secondary | ICD-10-CM

## 2015-02-13 DIAGNOSIS — G4733 Obstructive sleep apnea (adult) (pediatric): Secondary | ICD-10-CM | POA: Diagnosis not present

## 2015-02-13 DIAGNOSIS — K8010 Calculus of gallbladder with chronic cholecystitis without obstruction: Secondary | ICD-10-CM | POA: Diagnosis not present

## 2015-02-13 DIAGNOSIS — K819 Cholecystitis, unspecified: Secondary | ICD-10-CM

## 2015-02-13 HISTORY — DX: Calculus of gallbladder without cholecystitis without obstruction: K80.20

## 2015-02-13 LAB — URINALYSIS, ROUTINE W REFLEX MICROSCOPIC
Bilirubin Urine: NEGATIVE
GLUCOSE, UA: NEGATIVE mg/dL
Hgb urine dipstick: NEGATIVE
KETONES UR: NEGATIVE mg/dL
LEUKOCYTES UA: NEGATIVE
NITRITE: NEGATIVE
PH: 5.5 (ref 5.0–8.0)
Protein, ur: NEGATIVE mg/dL
SPECIFIC GRAVITY, URINE: 1.022 (ref 1.005–1.030)

## 2015-02-13 LAB — LIPASE, BLOOD: LIPASE: 23 U/L (ref 11–51)

## 2015-02-13 LAB — COMPREHENSIVE METABOLIC PANEL
ALT: 23 U/L (ref 17–63)
ANION GAP: 11 (ref 5–15)
AST: 21 U/L (ref 15–41)
Albumin: 4.1 g/dL (ref 3.5–5.0)
Alkaline Phosphatase: 52 U/L (ref 38–126)
BILIRUBIN TOTAL: 0.9 mg/dL (ref 0.3–1.2)
BUN: 11 mg/dL (ref 6–20)
CHLORIDE: 102 mmol/L (ref 101–111)
CO2: 22 mmol/L (ref 22–32)
Calcium: 9.2 mg/dL (ref 8.9–10.3)
Creatinine, Ser: 0.91 mg/dL (ref 0.61–1.24)
GFR calc Af Amer: >60 mL/min (ref >60)
Glucose, Bld: 104 mg/dL — ABNORMAL HIGH (ref 65–99)
POTASSIUM: 4.1 mmol/L (ref 3.5–5.1)
Sodium: 135 mmol/L (ref 135–145)
TOTAL PROTEIN: 6.8 g/dL (ref 6.5–8.1)

## 2015-02-13 LAB — CBC
HEMATOCRIT: 44.4 % (ref 39.0–52.0)
HEMOGLOBIN: 15.6 g/dL (ref 13.0–17.0)
MCH: 29.4 pg (ref 26.0–34.0)
MCHC: 35.1 g/dL (ref 30.0–36.0)
MCV: 83.6 fL (ref 78.0–100.0)
Platelets: 161 10*3/uL (ref 150–400)
RBC: 5.31 MIL/uL (ref 4.22–5.81)
RDW: 12.6 % (ref 11.5–15.5)
WBC: 5.8 10*3/uL (ref 4.0–10.5)

## 2015-02-13 MED ORDER — PANTOPRAZOLE SODIUM 40 MG PO TBEC
40.0000 mg | DELAYED_RELEASE_TABLET | Freq: Every day | ORAL | Status: DC
  Administered 2015-02-13 – 2015-02-15 (×2): 40 mg via ORAL
  Filled 2015-02-13 (×2): qty 1

## 2015-02-13 MED ORDER — HEPARIN SODIUM (PORCINE) 5000 UNIT/ML IJ SOLN
5000.0000 [IU] | Freq: Three times a day (TID) | INTRAMUSCULAR | Status: DC
  Administered 2015-02-14 – 2015-02-15 (×2): 5000 [IU] via SUBCUTANEOUS
  Filled 2015-02-13 (×2): qty 1

## 2015-02-13 MED ORDER — HYDROMORPHONE HCL 1 MG/ML IJ SOLN
1.0000 mg | INTRAMUSCULAR | Status: DC | PRN
  Administered 2015-02-13 – 2015-02-14 (×5): 1 mg via INTRAVENOUS
  Filled 2015-02-13 (×6): qty 1

## 2015-02-13 MED ORDER — ONDANSETRON HCL 4 MG/2ML IJ SOLN
4.0000 mg | Freq: Once | INTRAMUSCULAR | Status: AC
  Administered 2015-02-13: 4 mg via INTRAVENOUS
  Filled 2015-02-13: qty 2

## 2015-02-13 MED ORDER — ONDANSETRON HCL 4 MG/2ML IJ SOLN
4.0000 mg | Freq: Four times a day (QID) | INTRAMUSCULAR | Status: DC | PRN
  Filled 2015-02-13: qty 2

## 2015-02-13 MED ORDER — CETYLPYRIDINIUM CHLORIDE 0.05 % MT LIQD
7.0000 mL | Freq: Two times a day (BID) | OROMUCOSAL | Status: DC
  Administered 2015-02-13 – 2015-02-15 (×3): 7 mL via OROMUCOSAL

## 2015-02-13 MED ORDER — POTASSIUM CHLORIDE IN NACL 20-0.45 MEQ/L-% IV SOLN
INTRAVENOUS | Status: DC
  Administered 2015-02-13 – 2015-02-15 (×5): via INTRAVENOUS
  Filled 2015-02-13 (×6): qty 1000

## 2015-02-13 MED ORDER — AMLODIPINE BESYLATE 5 MG PO TABS
5.0000 mg | ORAL_TABLET | Freq: Every day | ORAL | Status: DC
  Administered 2015-02-13 – 2015-02-15 (×2): 5 mg via ORAL
  Filled 2015-02-13 (×2): qty 1

## 2015-02-13 MED ORDER — HYDROMORPHONE HCL 1 MG/ML IJ SOLN
1.0000 mg | Freq: Once | INTRAMUSCULAR | Status: AC
  Administered 2015-02-13: 1 mg via INTRAVENOUS
  Filled 2015-02-13: qty 1

## 2015-02-13 MED ORDER — ONDANSETRON 4 MG PO TBDP
4.0000 mg | ORAL_TABLET | Freq: Four times a day (QID) | ORAL | Status: DC | PRN
  Administered 2015-02-13: 4 mg via ORAL
  Filled 2015-02-13: qty 1

## 2015-02-13 MED ORDER — SODIUM CHLORIDE 0.9 % IV BOLUS (SEPSIS)
1000.0000 mL | Freq: Once | INTRAVENOUS | Status: AC
  Administered 2015-02-13: 1000 mL via INTRAVENOUS

## 2015-02-13 MED ORDER — TECHNETIUM TC 99M MEBROFENIN IV KIT
5.2000 | PACK | Freq: Once | INTRAVENOUS | Status: AC | PRN
  Administered 2015-02-13: 5 via INTRAVENOUS

## 2015-02-13 NOTE — ED Notes (Signed)
The pt took vicodin  2 hours ago

## 2015-02-13 NOTE — ED Notes (Signed)
abd  And rt shoulder pain for 2 weeks.  He had a [place] on Monday and was diagnosed with gallstones.  His pain has been increasing since then

## 2015-02-13 NOTE — ED Notes (Signed)
Notified Pt. That we need a urine sample.

## 2015-02-13 NOTE — Progress Notes (Addendum)
Patient complaining that no one is letting him know what is going on.  Says he wants something to eat if he is not having surgery tonight and he wants to see a doctor.  He says he has not spoken to a Psychologist, sport and exercise. He acknowledged speaking to the PA, but says he wants to see a doctor. I asked if there were any questions I might answer for him,but he repeated that he wanted to speak with a doctor . Paged MD to let him know patient's concerns.

## 2015-02-13 NOTE — ED Provider Notes (Signed)
CSN: [id number]     Arrival date & time 02/13/15  [id number] History   First MD Initiated Contact with Patient 02/13/15 [phone]     Chief Complaint  Patient presents with  . Abdominal Pain     (Consider location/radiation/quality/duration/timing/severity/associated sxs/prior Treatment) Patient is a 44 y.o. male presenting with abdominal pain. The history is provided by the patient.  Abdominal Pain He has been having pain in his right shoulder and right upper back and into his neck for about the last 10 days. He was treated with hydrocodone-acetaminophen which initially was giving him relief. He followed up with his PCP who added muscle relaxers but pain is getting progressively worse. He states the pain is severe and he rates it at 9/10. It does wax and wane but without any particular pattern. He is starting to have nausea. He saw his PCP who ordered a CT angiogram of the chest which showed no evidence of pulmonary embolism but did show a 2.5 cm gallstone. He is scheduled to see a surgeon in 3 days, but states that he has not been able to get relief from his pain and cannot take it any longer.  Past Medical History  Diagnosis Date  . GERD (gastroesophageal reflux disease)   . Hypertension   . Sleep apnea    Past Surgical History  Procedure Laterality Date  . Colonoscopy  10/16/2005    Polyps, external hemorrhoids  . Gastric restriction surgery  10/2010    sleeve- at [facility] History  Problem Relation Age of Onset  . Diabetes Mellitus II Mother   . Breast cancer Mother   . Diabetes Mellitus II Father   . CAD Father   . Stroke Father   . Cancer Father    Social History  Substance Use Topics  . Smoking status: Former Research scientist (life sciences)  . Smokeless tobacco: Former Systems developer  . Alcohol Use: Yes     Comment: Occasional    Review of Systems  Gastrointestinal: Positive for abdominal pain.  All other systems reviewed and are negative.     Allergies  Bee venom; Codeine; Hornet venom; and  Penicillins  Home Medications   Prior to Admission medications   Medication Sig Start Date End Date Taking? Authorizing Provider  amLODipine (NORVASC) 5 MG tablet Take 1 tablet (5 mg total) by mouth daily. 10/06/14  Yes Lucille Passy, MD  HYDROcodone-acetaminophen (NORCO/VICODIN) 5-325 MG tablet Take 1 tablet by mouth every 4 (four) hours as needed. Patient taking differently: Take 1 tablet by mouth every 4 (four) hours as needed for moderate pain.  02/03/15  Yes Kristen N Ward, DO  omeprazole (PRILOSEC) 20 MG capsule Take 20 mg by mouth daily.   Yes Historical Provider, MD   BP 138/92 mmHg  Pulse 73  Temp(Src)  (Oral)  Resp 20  Ht 6\' 2"  (1.88 m)  Wt 441 lb (200.036 kg)  BMI 56.60 kg/m2  SpO2 100% Physical Exam  Nursing note and vitals reviewed.  44 year old male, resting comfortably and in no acute distress. Vital signs are significant for borderline hypertension. Oxygen saturation is 100%, which is normal. Head is normocephalic and atraumatic. PERRLA, EOMI. Oropharynx is clear. Neck is nontender and supple without adenopathy or JVD. Back is nontender and there is no CVA tenderness. Lungs are clear without rales, wheezes, or rhonchi. Chest is nontender. Heart has regular rate and rhythm without murmur. Abdomen is soft, flat, with right upper quadrant tenderness. There are no masses or hepatosplenomegaly and peristalsis  is hypoactive. Extremities have no cyanosis or edema, full range of motion is present. Skin is warm and dry without rash. Neurologic: Mental status is normal, cranial nerves are intact, there are no motor or sensory deficits.  ED Course  Procedures (including critical care time) Labs Review Results for orders placed or performed during the hospital encounter of 02/13/15  Lipase, blood  Result Value Ref Range   Lipase 23 11 - 51 U/L  Comprehensive metabolic panel  Result Value Ref Range   Sodium 135 135 - 145 mmol/L   Potassium 4.1 3.5 - 5.1 mmol/L   Chloride  102 101 - 111 mmol/L   CO2 22 22 - 32 mmol/L   Glucose, Bld 104 (H) 65 - 99 mg/dL   BUN 11 6 - 20 mg/dL   Creatinine, Ser 0.91 0.61 - 1.24 mg/dL   Calcium 9.2 8.9 - 10.3 mg/dL   Total Protein 6.8 6.5 - 8.1 g/dL   Albumin 4.1 3.5 - 5.0 g/dL   AST 21 15 - 41 U/L   ALT 23 17 - 63 U/L   Alkaline Phosphatase 52 38 - 126 U/L   Total Bilirubin 0.9 0.3 - 1.2 mg/dL   GFR calc non Af Amer >60 >60 mL/min   GFR calc Af Amer >60 >60 mL/min   Anion gap 11 5 - 15  CBC  Result Value Ref Range   WBC 5.8 4.0 - 10.5 K/uL   RBC 5.31 4.22 - 5.81 MIL/uL   Hemoglobin 15.6 13.0 - 17.0 g/dL   HCT 44.4 39.0 - 52.0 %   MCV 83.6 78.0 - 100.0 fL   MCH 29.4 26.0 - 34.0 pg   MCHC 35.1 30.0 - 36.0 g/dL   RDW 12.6 11.5 - 15.5 %   Platelets 161 150 - 400 K/uL  Urinalysis, Routine w reflex microscopic (not at [facility])  Result Value Ref Range   Color, Urine YELLOW YELLOW   APPearance CLEAR CLEAR   Specific Gravity, Urine 1.022 1.005 - 1.030   pH 5.5 5.0 - 8.0   Glucose, UA NEGATIVE NEGATIVE mg/dL   Hgb urine dipstick NEGATIVE NEGATIVE   Bilirubin Urine NEGATIVE NEGATIVE   Ketones, ur NEGATIVE NEGATIVE mg/dL   Protein, ur NEGATIVE NEGATIVE mg/dL   Nitrite NEGATIVE NEGATIVE   Leukocytes, UA NEGATIVE NEGATIVE   Imaging Review US Abdomen Limited  02/13/2015  CLINICAL DATA:  Right upper quadrant pain EXAM: US ABDOMEN LIMITED - RIGHT UPPER QUADRANT COMPARISON:  CT abdomen and pelvis Jul 31, 2014. FINDINGS: Gallbladder: Within the gallbladder, there is a 1.5 cm echogenic focus which shadows, adherent in the neck of the gallbladder. Mild sludge is also seen within the gallbladder. There is no appreciable gallbladder wall thickening, and there is no pericholecystic fluid. No sonographic Murphy sign noted. Common bile duct: Diameter: 5 mm. There is no intrahepatic or extrahepatic biliary duct dilatation. Liver: No focal lesion identified. Liver echogenicity is diffusely increased. There is a cyst arising from the right  kidney measuring 8.8 x 6.8 x 7.4 cm. IMPRESSION: Cholelithiasis with calculus adherent in the neck of the gallbladder. There is also mild sludge in the gallbladder. Gallbladder wall is not thickened, and there is no demonstrable pericholecystic fluid. Liver echogenicity is diffusely increased, a finding most likely indicative of hepatic steatosis. While no focal liver lesions are identified, it must be cautioned that the sensitivity of ultrasound for focal liver lesions is diminished in this circumstance. Sizable right renal cyst. Electronically Signed   By: Lowella Grip  III M.D.   On: 02/13/2015 07:47   I have personally reviewed and evaluated these images and lab results as part of my medical decision-making.   MDM   Final diagnoses:  Biliary colic    Right shoulder pain which apparently is referred pain from his gallstone. Laboratory workup is unremarkable including normal transaminases, bilirubin, lipase. I reviewed his CT scan which did show 2.5 cm gallstone. Also, he had been admitted to the hospital 6 months ago for abdominal pain and vomiting and CT of the abdomen and pelvis at that time also showed the presence of a gallstone. Patient states that he was not informed of the gallstone being there. At this point, the issue seems to be pain control. He is given IV fluids and IV hydromorphone and ondansetron for nausea and is being sent for ultrasound to look for evidence of cholecystitis.  Ultrasound doesn't not show evidence of cholecystitis. He did get reasonable relief of pain with hydromorphone. General surgery will be consult today for possible admission.  Delora Fuel, MD [id number]

## 2015-02-13 NOTE — H&P (Signed)
[name] [name] is an 44 y.o. male.   Chief Complaint: Abd/chest pain HPI: [name] [name] was in his usual state of health until about 1 month ago when he noticed some mild occasional back pain and nausea. This did not seem to be associated with eating. About 10-14d ago he again developed right back pain but this did not go away. Instead it steadily increased in intensity, radiating to his right shoulder and arm. He is unable to take a deep breath because of the pain. It is exacerbated by movement and somewhat by eating.   Past Medical History  Diagnosis Date  . GERD (gastroesophageal reflux disease)   . Hypertension   . Sleep apnea     Past Surgical History  Procedure Laterality Date  . Colonoscopy  10/16/2005    Polyps, external hemorrhoids  . Gastric restriction surgery  10/2010    sleeve- at [facility] History  Problem Relation Age of Onset  . Diabetes Mellitus II Mother   . Breast cancer Mother   . Diabetes Mellitus II Father   . CAD Father   . Stroke Father   . Cancer Father    Social History:  reports that he has quit smoking. He has quit using smokeless tobacco. He reports that he drinks alcohol. He reports that he does not use illicit drugs.  Allergies:  Allergies  Allergen Reactions  . Bee Venom Anaphylaxis  . Codeine Anaphylaxis  . Hornet Venom Anaphylaxis  . Penicillins Hives    Results for orders placed or performed during the hospital encounter of 02/13/15 (from the past 48 hour(s))  Lipase, blood     Status: None   Collection Time: 02/13/15  4:10 AM  Result Value Ref Range   Lipase 23 11 - 51 U/L  Comprehensive metabolic panel     Status: Abnormal   Collection Time: 02/13/15  4:10 AM  Result Value Ref Range   Sodium 135 135 - 145 mmol/L   Potassium 4.1 3.5 - 5.1 mmol/L   Chloride 102 101 - 111 mmol/L   CO2 22 22 - 32 mmol/L   Glucose, Bld 104 (H) 65 - 99 mg/dL   BUN 11 6 - 20 mg/dL   Creatinine, Ser 0.91 0.61 - 1.24 mg/dL   Calcium 9.2 8.9 - 10.3  mg/dL   Total Protein 6.8 6.5 - 8.1 g/dL   Albumin 4.1 3.5 - 5.0 g/dL   AST 21 15 - 41 U/L   ALT 23 17 - 63 U/L   Alkaline Phosphatase 52 38 - 126 U/L   Total Bilirubin 0.9 0.3 - 1.2 mg/dL   GFR calc non Af Amer >60 >60 mL/min   GFR calc Af Amer >60 >60 mL/min    Comment: (NOTE) The eGFR has been calculated using the CKD EPI equation. This calculation has not been validated in all clinical situations. eGFR's persistently <60 mL/min signify possible Chronic Kidney Disease.    Anion gap 11 5 - 15  CBC     Status: None   Collection Time: 02/13/15  4:10 AM  Result Value Ref Range   WBC 5.8 4.0 - 10.5 K/uL   RBC 5.31 4.22 - 5.81 MIL/uL   Hemoglobin 15.6 13.0 - 17.0 g/dL   HCT 44.4 39.0 - 52.0 %   MCV 83.6 78.0 - 100.0 fL   MCH 29.4 26.0 - 34.0 pg   MCHC 35.1 30.0 - 36.0 g/dL   RDW 12.6 11.5 - 15.5 %   Platelets 161 150 -  400 K/uL  Urinalysis, Routine w reflex microscopic (not at [facility])     Status: None   Collection Time: 02/13/15  6:23 AM  Result Value Ref Range   Color, Urine YELLOW YELLOW   APPearance CLEAR CLEAR   Specific Gravity, Urine 1.022 1.005 - 1.030   pH 5.5 5.0 - 8.0   Glucose, UA NEGATIVE NEGATIVE mg/dL   Hgb urine dipstick NEGATIVE NEGATIVE   Bilirubin Urine NEGATIVE NEGATIVE   Ketones, ur NEGATIVE NEGATIVE mg/dL   Protein, ur NEGATIVE NEGATIVE mg/dL   Nitrite NEGATIVE NEGATIVE   Leukocytes, UA NEGATIVE NEGATIVE    Comment: MICROSCOPIC NOT DONE ON URINES WITH NEGATIVE PROTEIN, BLOOD, LEUKOCYTES, NITRITE, OR GLUCOSE <1000 mg/dL.   US Abdomen Limited  02/13/2015  CLINICAL DATA:  Right upper quadrant pain EXAM: US ABDOMEN LIMITED - RIGHT UPPER QUADRANT COMPARISON:  CT abdomen and pelvis Jul 31, 2014. FINDINGS: Gallbladder: Within the gallbladder, there is a 1.5 cm echogenic focus which shadows, adherent in the neck of the gallbladder. Mild sludge is also seen within the gallbladder. There is no appreciable gallbladder wall thickening, and there is no pericholecystic  fluid. No sonographic Murphy sign noted. Common bile duct: Diameter: 5 mm. There is no intrahepatic or extrahepatic biliary duct dilatation. Liver: No focal lesion identified. Liver echogenicity is diffusely increased. There is a cyst arising from the right kidney measuring 8.8 x 6.8 x 7.4 cm. IMPRESSION: Cholelithiasis with calculus adherent in the neck of the gallbladder. There is also mild sludge in the gallbladder. Gallbladder wall is not thickened, and there is no demonstrable pericholecystic fluid. Liver echogenicity is diffusely increased, a finding most likely indicative of hepatic steatosis. While no focal liver lesions are identified, it must be cautioned that the sensitivity of ultrasound for focal liver lesions is diminished in this circumstance. Sizable right renal cyst. Electronically Signed   By: Lowella Grip III M.D.   On: 02/13/2015 07:47    Review of Systems  Constitutional: Negative for fever, chills and weight loss.  Eyes: Negative for blurred vision.  Respiratory: Positive for shortness of breath. Negative for cough and sputum production.   Cardiovascular: Positive for chest pain.  Gastrointestinal: Positive for nausea and abdominal pain. Negative for heartburn and vomiting.  Genitourinary: Negative for dysuria.  Musculoskeletal: Positive for back pain and neck pain.  Neurological: Negative for dizziness and headaches.  All other systems reviewed and are negative.   Blood pressure 142/85, pulse 74, resp. rate 20, height _0  (1.88 m), weight 200.036 kg (441 lb), SpO2 96 %. Physical Exam  Constitutional: He appears well-developed and well-nourished. No distress.  HENT:  Head: Normocephalic and atraumatic.  Right Ear: External ear normal.  Left Ear: External ear normal.  Nose: Nose normal.  Mouth/Throat: Oropharynx is clear and moist. No oropharyngeal exudate.  Eyes: Conjunctivae are normal. Pupils are equal, round, and reactive to light. Right eye exhibits no  discharge. Left eye exhibits no discharge. No scleral icterus.  Neck: Normal range of motion. Neck supple.  Cardiovascular: Normal rate, regular rhythm, normal heart sounds and intact distal pulses.  Exam reveals no gallop and no friction rub.   No murmur heard. Respiratory: Effort normal and breath sounds normal. No respiratory distress. He has no wheezes. He has no rales. He exhibits no tenderness.  GI: Soft. Bowel sounds are decreased. There is tenderness in the right upper quadrant. There is positive Murphy's sign.  Musculoskeletal: Normal range of motion. He exhibits no edema or tenderness.  Lymphadenopathy:  He has no cervical adenopathy.  Neurological: He is alert.  Skin: Skin is warm and dry. He is not diaphoretic.  Psychiatric: He has a normal mood and affect. His behavior is normal.     Assessment/Plan Symptomatic cholelithiasis -- Would benefit from lap chole. I went over risks and benefits and he would like to proceed. I told him the final decision would be made by the surgeon. GERD HTN OSA    Lisette Abu, PA-C Pager: [phone] 02/13/2015, 9:46 AM

## 2015-02-14 ENCOUNTER — Observation Stay (HOSPITAL_COMMUNITY): Payer: BC Managed Care – PPO | Admitting: Anesthesiology

## 2015-02-14 ENCOUNTER — Encounter (HOSPITAL_COMMUNITY)
Admission: EM | Disposition: A | Discharge status: Home / Self Care | Payer: Self-pay | Source: Home / Self Care | Attending: Emergency Medicine

## 2015-02-14 ENCOUNTER — Observation Stay (HOSPITAL_COMMUNITY): Payer: BC Managed Care – PPO

## 2015-02-14 HISTORY — PX: CHOLECYSTECTOMY: SHX55

## 2015-02-14 LAB — CBC
HEMATOCRIT: 42.7 % (ref 39.0–52.0)
HEMOGLOBIN: 14.5 g/dL (ref 13.0–17.0)
MCH: 29.2 pg (ref 26.0–34.0)
MCHC: 34 g/dL (ref 30.0–36.0)
MCV: 86.1 fL (ref 78.0–100.0)
Platelets: 171 10*3/uL (ref 150–400)
RBC: 4.96 MIL/uL (ref 4.22–5.81)
RDW: 12.8 % (ref 11.5–15.5)
WBC: 5.3 10*3/uL (ref 4.0–10.5)

## 2015-02-14 LAB — COMPREHENSIVE METABOLIC PANEL
ALBUMIN: 3.8 g/dL (ref 3.5–5.0)
ALT: 25 U/L (ref 17–63)
ANION GAP: 7 (ref 5–15)
AST: 22 U/L (ref 15–41)
Alkaline Phosphatase: 57 U/L (ref 38–126)
BUN: 12 mg/dL (ref 6–20)
CO2: 28 mmol/L (ref 22–32)
Calcium: 8.8 mg/dL — ABNORMAL LOW (ref 8.9–10.3)
Chloride: 100 mmol/L — ABNORMAL LOW (ref 101–111)
Creatinine, Ser: 1.07 mg/dL (ref 0.61–1.24)
GFR calc Af Amer: >60 mL/min (ref >60)
GFR calc non Af Amer: >60 mL/min (ref >60)
GLUCOSE: 106 mg/dL — AB (ref 65–99)
POTASSIUM: 4.4 mmol/L (ref 3.5–5.1)
SODIUM: 135 mmol/L (ref 135–145)
Total Bilirubin: 1.4 mg/dL — ABNORMAL HIGH (ref 0.3–1.2)
Total Protein: 6.9 g/dL (ref 6.5–8.1)

## 2015-02-14 LAB — SURGICAL PCR SCREEN
MRSA, PCR: NEGATIVE
STAPHYLOCOCCUS AUREUS: NEGATIVE

## 2015-02-14 SURGERY — LAPAROSCOPIC CHOLECYSTECTOMY WITH INTRAOPERATIVE CHOLANGIOGRAM
Anesthesia: General | Site: Abdomen

## 2015-02-14 MED ORDER — SUGAMMADEX SODIUM 200 MG/2ML IV SOLN
INTRAVENOUS | Status: AC
  Filled 2015-02-14: qty 2

## 2015-02-14 MED ORDER — BUPIVACAINE-EPINEPHRINE (PF) 0.25% -1:200000 IJ SOLN
INTRAMUSCULAR | Status: AC
  Filled 2015-02-14: qty 30

## 2015-02-14 MED ORDER — PROMETHAZINE HCL 25 MG/ML IJ SOLN
INTRAMUSCULAR | Status: AC
  Administered 2015-02-14: 6.25 mg via INTRAVENOUS
  Filled 2015-02-14: qty 1

## 2015-02-14 MED ORDER — MIDAZOLAM HCL 2 MG/2ML IJ SOLN
INTRAMUSCULAR | Status: AC
  Filled 2015-02-14: qty 2

## 2015-02-14 MED ORDER — LIDOCAINE HCL (CARDIAC) 20 MG/ML IV SOLN
INTRAVENOUS | Status: AC
  Filled 2015-02-14: qty 5

## 2015-02-14 MED ORDER — DEXAMETHASONE SODIUM PHOSPHATE 10 MG/ML IJ SOLN
INTRAMUSCULAR | Status: AC
  Administered 2015-02-14: 8 mg via INTRAVENOUS
  Filled 2015-02-14: qty 1

## 2015-02-14 MED ORDER — 0.9 % SODIUM CHLORIDE (POUR BTL) OPTIME
TOPICAL | Status: DC | PRN
  Administered 2015-02-14: 1000 mL

## 2015-02-14 MED ORDER — SUGAMMADEX SODIUM 500 MG/5ML IV SOLN
INTRAVENOUS | Status: AC
  Filled 2015-02-14: qty 5

## 2015-02-14 MED ORDER — SODIUM CHLORIDE 0.9 % IR SOLN
Status: DC | PRN
  Administered 2015-02-14: 1000 mL

## 2015-02-14 MED ORDER — HYDROMORPHONE HCL 1 MG/ML IJ SOLN
INTRAMUSCULAR | Status: AC
  Administered 2015-02-14: 0.5 mg via INTRAVENOUS
  Filled 2015-02-14: qty 1

## 2015-02-14 MED ORDER — LIDOCAINE HCL (CARDIAC) 20 MG/ML IV SOLN
INTRAVENOUS | Status: DC | PRN
  Administered 2015-02-14: 100 mg via INTRAVENOUS

## 2015-02-14 MED ORDER — OXYCODONE-ACETAMINOPHEN 5-325 MG PO TABS
1.0000 | ORAL_TABLET | ORAL | Status: DC | PRN
  Administered 2015-02-14 – 2015-02-15 (×3): 2 via ORAL
  Filled 2015-02-14 (×4): qty 2

## 2015-02-14 MED ORDER — FENTANYL CITRATE (PF) 100 MCG/2ML IJ SOLN
INTRAMUSCULAR | Status: DC | PRN
  Administered 2015-02-14 (×4): 50 ug via INTRAVENOUS
  Administered 2015-02-14: 150 ug via INTRAVENOUS

## 2015-02-14 MED ORDER — FENTANYL CITRATE (PF) 250 MCG/5ML IJ SOLN
INTRAMUSCULAR | Status: AC
  Filled 2015-02-14: qty 5

## 2015-02-14 MED ORDER — ROCURONIUM BROMIDE 100 MG/10ML IV SOLN
INTRAVENOUS | Status: DC | PRN
  Administered 2015-02-14 (×2): 10 mg via INTRAVENOUS
  Administered 2015-02-14: 40 mg via INTRAVENOUS
  Administered 2015-02-14: 10 mg via INTRAVENOUS

## 2015-02-14 MED ORDER — SODIUM CHLORIDE 0.9 % IV SOLN
INTRAVENOUS | Status: DC | PRN
  Administered 2015-02-14: 11 mL

## 2015-02-14 MED ORDER — KETOROLAC TROMETHAMINE 30 MG/ML IJ SOLN
INTRAMUSCULAR | Status: DC | PRN
  Administered 2015-02-14: 30 mg via INTRAVENOUS

## 2015-02-14 MED ORDER — PROPOFOL 10 MG/ML IV BOLUS
INTRAVENOUS | Status: AC
  Filled 2015-02-14: qty 20

## 2015-02-14 MED ORDER — ONDANSETRON HCL 4 MG/2ML IJ SOLN
INTRAMUSCULAR | Status: AC
  Filled 2015-02-14: qty 2

## 2015-02-14 MED ORDER — BUPIVACAINE-EPINEPHRINE 0.25% -1:200000 IJ SOLN
INTRAMUSCULAR | Status: DC | PRN
  Administered 2015-02-14: 30 mL

## 2015-02-14 MED ORDER — PROMETHAZINE HCL 25 MG/ML IJ SOLN
6.2500 mg | INTRAMUSCULAR | Status: AC | PRN
  Administered 2015-02-14 (×2): 6.25 mg via INTRAVENOUS

## 2015-02-14 MED ORDER — SUCCINYLCHOLINE CHLORIDE 20 MG/ML IJ SOLN
INTRAMUSCULAR | Status: DC | PRN
  Administered 2015-02-14: 120 mg via INTRAVENOUS

## 2015-02-14 MED ORDER — HYDROMORPHONE HCL 1 MG/ML IJ SOLN
0.2500 mg | INTRAMUSCULAR | Status: DC | PRN
  Administered 2015-02-14 (×2): 0.5 mg via INTRAVENOUS

## 2015-02-14 MED ORDER — DEXAMETHASONE SODIUM PHOSPHATE 10 MG/ML IJ SOLN
8.0000 mg | Freq: Once | INTRAMUSCULAR | Status: AC
  Administered 2015-02-14: 8 mg via INTRAVENOUS

## 2015-02-14 MED ORDER — CIPROFLOXACIN IN D5W 400 MG/200ML IV SOLN
400.0000 mg | INTRAVENOUS | Status: AC
  Administered 2015-02-14: 400 mg via INTRAVENOUS
  Filled 2015-02-14: qty 200

## 2015-02-14 MED ORDER — PHENYLEPHRINE 40 MCG/ML (10ML) SYRINGE FOR IV PUSH (FOR BLOOD PRESSURE SUPPORT)
PREFILLED_SYRINGE | INTRAVENOUS | Status: AC
  Filled 2015-02-14: qty 10

## 2015-02-14 MED ORDER — PROPOFOL 10 MG/ML IV BOLUS
INTRAVENOUS | Status: DC | PRN
  Administered 2015-02-14: 200 mg via INTRAVENOUS

## 2015-02-14 MED ORDER — LACTATED RINGERS IV SOLN
INTRAVENOUS | Status: DC | PRN
  Administered 2015-02-14 (×2): via INTRAVENOUS

## 2015-02-14 MED ORDER — SUGAMMADEX SODIUM 200 MG/2ML IV SOLN
INTRAVENOUS | Status: DC | PRN
  Administered 2015-02-14 (×2): 200 mg via INTRAVENOUS

## 2015-02-14 MED ORDER — MIDAZOLAM HCL 5 MG/5ML IJ SOLN
INTRAMUSCULAR | Status: DC | PRN
  Administered 2015-02-14: 2 mg via INTRAVENOUS

## 2015-02-14 MED ORDER — ONDANSETRON HCL 4 MG/2ML IJ SOLN
INTRAMUSCULAR | Status: DC | PRN
  Administered 2015-02-14: 4 mg via INTRAVENOUS

## 2015-02-14 SURGICAL SUPPLY — 36 items
APPLIER CLIP 5 13 M/L LIGAMAX5 (MISCELLANEOUS) ×3
APR CLP MED LRG 5 ANG JAW (MISCELLANEOUS) ×1
BAG SPEC RTRVL LRG 6X4 10 (ENDOMECHANICALS) ×1
BLADE SURG CLIPPER 3M 9600 (MISCELLANEOUS) ×2 IMPLANT
CANISTER SUCTION 2500CC (MISCELLANEOUS) ×3 IMPLANT
CHLORAPREP W/TINT 26ML (MISCELLANEOUS) ×3 IMPLANT
CLIP APPLIE 5 13 M/L LIGAMAX5 (MISCELLANEOUS) ×1 IMPLANT
COVER MAYO STAND STRL (DRAPES) ×3 IMPLANT
COVER SURGICAL LIGHT HANDLE (MISCELLANEOUS) ×3 IMPLANT
DRAPE C-ARM 42X72 X-RAY (DRAPES) ×3 IMPLANT
ELECT REM PT RETURN 9FT ADLT (ELECTROSURGICAL) ×3
ELECTRODE REM PT RTRN 9FT ADLT (ELECTROSURGICAL) ×1 IMPLANT
GLOVE SURG SIGNA 7.5 PF LTX (GLOVE) ×3 IMPLANT
GOWN STRL REUS W/ TWL LRG LVL3 (GOWN DISPOSABLE) ×2 IMPLANT
GOWN STRL REUS W/ TWL XL LVL3 (GOWN DISPOSABLE) ×1 IMPLANT
GOWN STRL REUS W/TWL LRG LVL3 (GOWN DISPOSABLE) ×3
GOWN STRL REUS W/TWL XL LVL3 (GOWN DISPOSABLE) ×6
HOVERMATT SINGLE USE (MISCELLANEOUS) ×2 IMPLANT
KIT BASIN OR (CUSTOM PROCEDURE TRAY) ×3 IMPLANT
KIT ROOM TURNOVER OR (KITS) ×3 IMPLANT
LIQUID BAND (GAUZE/BANDAGES/DRESSINGS) ×6 IMPLANT
NS IRRIG 1000ML POUR BTL (IV SOLUTION) ×3 IMPLANT
PAD ARMBOARD 7.5X6 YLW CONV (MISCELLANEOUS) ×3 IMPLANT
POUCH SPECIMEN RETRIEVAL 10MM (ENDOMECHANICALS) ×3 IMPLANT
SCISSORS LAP 5X35 DISP (ENDOMECHANICALS) ×3 IMPLANT
SET CHOLANGIOGRAPH 5 50 .035 (SET/KITS/TRAYS/PACK) ×3 IMPLANT
SET IRRIG TUBING LAPAROSCOPIC (IRRIGATION / IRRIGATOR) ×3 IMPLANT
SLEEVE ENDOPATH XCEL 5M (ENDOMECHANICALS) ×6 IMPLANT
SPECIMEN JAR SMALL (MISCELLANEOUS) ×3 IMPLANT
SUT MON AB 4-0 PC3 18 (SUTURE) ×3 IMPLANT
TOWEL OR 17X24 6PK STRL BLUE (TOWEL DISPOSABLE) ×3 IMPLANT
TOWEL OR 17X26 10 PK STRL BLUE (TOWEL DISPOSABLE) ×3 IMPLANT
TRAY LAPAROSCOPIC MC (CUSTOM PROCEDURE TRAY) ×3 IMPLANT
TROCAR XCEL BLUNT TIP 100MML (ENDOMECHANICALS) ×3 IMPLANT
TROCAR XCEL NON-BLD 5MMX100MML (ENDOMECHANICALS) ×3 IMPLANT
TUBING INSUFFLATION (TUBING) ×3 IMPLANT

## 2015-02-14 NOTE — Anesthesia Preprocedure Evaluation (Addendum)
Anesthesia Evaluation  Patient identified by MRN, date of birth, ID band Patient awake    Reviewed: Allergy & Precautions, NPO status , Patient's Chart, lab work & pertinent test results  Airway Mallampati: II  TM Distance: <3 FB Neck ROM: Full    Dental no notable dental hx. (+) Teeth Intact, Dental Advisory Given   Pulmonary sleep apnea , former smoker,    breath sounds clear to auscultation + decreased breath sounds      Cardiovascular hypertension, Pt. on medications Normal cardiovascular exam Rhythm:Regular Rate:Normal     Neuro/Psych Anxiety negative neurological ROS  negative psych ROS   GI/Hepatic negative GI ROS, Neg liver ROS, GERD  Medicated and Controlled,  Endo/Other  Morbid obesity  Renal/GU negative Renal ROS  negative genitourinary   Musculoskeletal negative musculoskeletal ROS (+)   Abdominal (+) + obese,   Peds negative pediatric ROS (+)  Hematology negative hematology ROS (+)   Anesthesia Other Findings   Reproductive/Obstetrics negative OB ROS                            Anesthesia Physical Anesthesia Plan  ASA: III  Anesthesia Plan: General   Post-op Pain Management:    Induction: Intravenous  Airway Management Planned: Oral ETT and Video Laryngoscope Planned  Additional Equipment:   Intra-op Plan:   Post-operative Plan: Extubation in OR  Informed Consent: I have reviewed the patients History and Physical, chart, labs and discussed the procedure including the risks, benefits and alternatives for the proposed anesthesia with the patient or authorized representative who has indicated his/her understanding and acceptance.   Dental advisory given  Plan Discussed with: CRNA, Surgeon and Anesthesiologist  Anesthesia Plan Comments:        Anesthesia Quick Evaluation

## 2015-02-14 NOTE — Progress Notes (Signed)
Subjective: Still with pain in right back and shoulder  Objective: Vital signs in last 24 hours: Temp:  [97.7 F (36.5 C)-98.3 F (36.8 C)] 98 F (36.7 C) (12/12 0503) Pulse Rate:  [71-86] 86 (12/12 0503) Resp:  [18-20] 19 (12/12 0503) BP: (101-149)/(54-106) 141/81 mmHg (12/12 0503) SpO2:  [90 %-100 %] 100 % (12/12 0503)    Intake/Output from previous day: 12/11 0701 - 12/12 0700 In: 1232 [I.V.:1232] Out: 200 [Urine:200] Intake/Output this shift:    Uncomfortable looking Lungs clear Abdomen soft, non tender  Lab Results:   Recent Labs  02/13/15 0410 02/14/15 0624  WBC 5.8 5.3  HGB 15.6 14.5  HCT 44.4 42.7  PLT 161 171   BMET  Recent Labs  02/13/15 0410 02/14/15 0624  NA 135 135  K 4.1 4.4  CL 102 100*  CO2 22 28  GLUCOSE 104* 106*  BUN 11 12  CREATININE 0.91 1.07  CALCIUM 9.2 8.8*   PT/INR No results for input(s): LABPROT, INR in the last 72 hours. ABG No results for input(s): PHART, HCO3 in the last 72 hours.  Invalid input(s): PCO2, PO2  Studies/Results: Nm Hepatobiliary Including Gb  02/13/2015  CLINICAL DATA:  44 year old male with cholelithiasis on ultrasound and right upper quadrant pain. Initial encounter. EXAM: NUCLEAR MEDICINE HEPATOBILIARY IMAGING TECHNIQUE: Sequential images of the abdomen were obtained out to 60 minutes following intravenous administration of radiopharmaceutical. RADIOPHARMACEUTICALS:  5.2 MCi Tc-1m  Choletec IV COMPARISON:  Ultrasound 0708 hours today, and earlier. FINDINGS: Prompt radiotracer uptake by the liver and clearance of the blood pool. Prompt appearance of the gallbladder by 10 minutes, and gallbladder activity increases over the exam. CBD activity by 15 minutes, and small bowel activity by 30-35 minutes. Radiotracer activity washes out of the liver over the course of the study. IMPRESSION: Negative study; patent cystic duct and CBD. Gallbladder activity by 10 minutes and small bowel activity by 30 minutes.  Electronically Signed   By: Genevie Ann M.D.   On: 02/13/2015 17:08   US Abdomen Limited  02/13/2015  CLINICAL DATA:  Right upper quadrant pain EXAM: US ABDOMEN LIMITED - RIGHT UPPER QUADRANT COMPARISON:  CT abdomen and pelvis Jul 31, 2014. FINDINGS: Gallbladder: Within the gallbladder, there is a 1.5 cm echogenic focus which shadows, adherent in the neck of the gallbladder. Mild sludge is also seen within the gallbladder. There is no appreciable gallbladder wall thickening, and there is no pericholecystic fluid. No sonographic Murphy sign noted. Common bile duct: Diameter: 5 mm. There is no intrahepatic or extrahepatic biliary duct dilatation. Liver: No focal lesion identified. Liver echogenicity is diffusely increased. There is a cyst arising from the right kidney measuring 8.8 x 6.8 x 7.4 cm. IMPRESSION: Cholelithiasis with calculus adherent in the neck of the gallbladder. There is also mild sludge in the gallbladder. Gallbladder wall is not thickened, and there is no demonstrable pericholecystic fluid. Liver echogenicity is diffusely increased, a finding most likely indicative of hepatic steatosis. While no focal liver lesions are identified, it must be cautioned that the sensitivity of ultrasound for focal liver lesions is diminished in this circumstance. Sizable right renal cyst. Electronically Signed   By: Lowella Grip III M.D.   On: 02/13/2015 07:47    Anti-infectives: Anti-infectives    None      Assessment/Plan: s/p * No surgery found *  Symptomatic cholelithiasis  I recommend going ahead with surgery given his symptoms and the stone in the gallbladder neck  I discussed the procedure in  detail.  We discussed the risks and benefits of a laparoscopic cholecystectomy and  cholangiogram including, but not limited to bleeding, infection, injury to surrounding structures such as the intestine or liver, bile leak, retained gallstones, need to convert to an open procedure, prolonged diarrhea,  blood clots such as  DVT, common bile duct injury, anesthesia risks, and possible need for additional procedures as well as the possibility this may not improve his pain.  The likelihood of improvement in symptoms and return to the patient's normal status is over all good. We discussed the typical post-operative recovery course.     Sheva Mcdougle A 02/14/2015

## 2015-02-14 NOTE — Anesthesia Postprocedure Evaluation (Signed)
Anesthesia Post Note  Patient: [name] [name]  Procedure(s) Performed: Procedure(s) (LRB): LAPAROSCOPIC CHOLECYSTECTOMY WITH INTRAOPERATIVE CHOLANGIOGRAM (N/A)  Patient location during evaluation: PACU Anesthesia Type: General Level of consciousness: sedated Pain management: pain level controlled Vital Signs Assessment: post-procedure vital signs reviewed and stable Respiratory status: spontaneous breathing and respiratory function stable Cardiovascular status: stable Anesthetic complications: no    Last Vitals:  Filed Vitals:   02/14/15 1110 02/14/15 1130  BP: 148/98 140/94  Pulse: 90 89  Temp: 36.4 C   Resp: 20 12          Adwoa Axe DANIEL

## 2015-02-14 NOTE — Op Note (Signed)
Laparoscopic Cholecystectomy with IOC Procedure Note  Indications: This patient presents with symptomatic gallbladder disease and will undergo laparoscopic cholecystectomy.  Pre-operative Diagnosis: Calculus of gallbladder without mention of cholecystitis or obstruction  Post-operative Diagnosis: Calculus of gallbladder with acute cholecystitis, without mention of obstruction  Surgeon: Coralie Keens A   Assistants: 0  Anesthesia: General endotracheal anesthesia  ASA Class: 2  Procedure Details  The patient was seen again in the Holding Room. The risks, benefits, complications, treatment options, and expected outcomes were discussed with the patient. The possibilities of reaction to medication, pulmonary aspiration, perforation of viscus, bleeding, recurrent infection, finding a normal gallbladder, the need for additional procedures, failure to diagnose a condition, the possible need to convert to an open procedure, and creating a complication requiring transfusion or operation were discussed with the patient. The likelihood of improving the patient's symptoms with return to their baseline status is good.  The patient and/or family concurred with the proposed plan, giving informed consent. The site of surgery properly noted. The patient was taken to Operating Room, identified as [name] [name] and the procedure verified as Laparoscopic Cholecystectomy with Intraoperative Cholangiogram. A Time Out was held and the above information confirmed.  Prior to the induction of general anesthesia, antibiotic prophylaxis was administered. General endotracheal anesthesia was then administered and tolerated well. After the induction, the abdomen was prepped with Chloraprep and draped in the sterile fashion. The patient was positioned in the supine position.  Local anesthetic agent was injected into the skin near the umbilicus and an incision made. We dissected down to the abdominal fascia with blunt  dissection.  The fascia was incised vertically and we entered the peritoneal cavity bluntly.  A pursestring suture of 0-Vicryl was placed around the fascial opening.  The Hasson cannula was inserted and secured with the stay suture.  Pneumoperitoneum was then created with CO2 and tolerated well without any adverse changes in the patient's vital signs. A 5-mm port was placed in the subxiphoid position.  Two 5-mm ports were placed in the right upper quadrant. All skin incisions were infiltrated with a local anesthetic agent before making the incision and placing the trocars.   We positioned the patient in reverse Trendelenburg, tilted slightly to the patient's left.  The gallbladder was identified, the fundus grasped and retracted cephalad. Adhesions were lysed bluntly and with the electrocautery where indicated, taking care not to injure any adjacent organs or viscus. The infundibulum was grasped and retracted laterally, exposing the peritoneum overlying the triangle of Calot. This was then divided and exposed in a blunt fashion. A critical view of the cystic duct and cystic artery was obtained.  The cystic duct was clearly identified and bluntly dissected circumferentially. The cystic duct was ligated with a clip distally.   An incision was made in the cystic duct and the [facility] cholangiogram catheter introduced. The catheter was secured using a clip. A cholangiogram was then obtained which showed good visualization of the distal and proximal biliary tree with no sign of filling defects or obstruction.  Contrast flowed easily into the duodenum. The catheter was then removed.   The cystic duct was then ligated with clips and divided. The cystic artery was identified, dissected free, ligated with clips and divided as well.   The gallbladder was dissected from the liver bed in retrograde fashion with the electrocautery. The gallbladder was removed and placed in an Endocatch sac. The liver bed was irrigated and  inspected. Hemostasis was achieved with the electrocautery. Copious irrigation was  utilized and was repeatedly aspirated until clear.  The gallbladder and Endocatch sac were then removed through the umbilical port site.  The pursestring suture was used to close the umbilical fascia.    We again inspected the right upper quadrant for hemostasis.  Pneumoperitoneum was released as we removed the trocars.  4-0 Monocryl was used to close the skin.   Skin glue was then applied. The patient was then extubated and brought to the recovery room in stable condition. Instrument, sponge, and needle counts were correct at closure and at the conclusion of the case.   Findings: Cholecystitis with Cholelithiasis  Estimated Blood Loss: Minimal         Drains: 0         Specimens: Gallbladder           Complications: None; patient tolerated the procedure well.         Disposition: PACU - hemodynamically stable.         Condition: stable

## 2015-02-14 NOTE — Progress Notes (Signed)
Patient demanded that the IV be disconnected so that he won't feel so "tied down." Patient also wanted to have his diet advanced due to not being able to eat the liquids. Patient's diet advanced to regular. Patient encouraged to start slow with regular foods.

## 2015-02-14 NOTE — Discharge Instructions (Signed)
Laparoscopic Cholecystectomy, Care After °Refer to this sheet in the next few weeks. These instructions provide you with information about caring for yourself after your procedure. Your health care provider may also give you more specific instructions. Your treatment has been planned according to current medical practices, but problems sometimes occur. Call your health care provider if you have any problems or questions after your procedure. °WHAT TO EXPECT AFTER THE PROCEDURE °After your procedure, it is common to have: °· Pain at your incision sites. You will be given pain medicines to control your pain. °· Mild nausea or vomiting. This should improve after the first 24 hours. °· Bloating and possible shoulder pain from the gas that was used during the procedure. This will improve after the first 24 hours. °HOME CARE INSTRUCTIONS °Incision Care °· Follow instructions from your health care provider about how to take care of your incisions. Make sure you: °¨ Wash your hands with soap and water before you change your bandage (dressing). If soap and water are not available, use hand sanitizer. °¨ Change your dressing as told by your health care provider. °¨ Leave stitches (sutures), skin glue, or adhesive strips in place. These skin closures may need to be in place for 2 weeks or longer. If adhesive strip edges start to loosen and curl up, you may trim the loose edges. Do not remove adhesive strips completely unless your health care provider tells you to do that. °· Do not take baths, swim, or use a hot tub until your health care provider approves. Ask your health care provider if you can take showers. You may only be allowed to take sponge baths for bathing. °General Instructions °· Take over-the-counter and prescription medicines only as told by your health care provider. °· Do not drive or operate heavy machinery while taking prescription pain medicine. °· Return to your normal diet as told by your health care  provider. °· Do not lift anything that is heavier than 10 lb (4.5 kg). °· Do not play contact sports for one week or until your health care provider approves. °SEEK MEDICAL CARE IF:  °· You have redness, swelling, or pain at the site of your incision. °· You have fluid, blood, or pus coming from your incision. °· You notice a bad smell coming from your incision area. °· Your surgical incisions break open. °· You have a fever. °SEEK IMMEDIATE MEDICAL CARE IF: °· You develop a rash. °· You have difficulty breathing. °· You have chest pain. °· You have increasing pain in your shoulders (shoulder strap areas). °· You faint or have dizzy episodes while you are standing. °· You have severe pain in your abdomen. °· You have nausea or vomiting that lasts for more than one day. °  °This information is not intended to replace advice given to you by your health care provider. Make sure you discuss any questions you have with your health care provider. °  °Document Released: 02/19/2005 Document Revised: 11/10/2014 Document Reviewed: 10/01/2012 °Elsevier Interactive Patient Education ©2016 [employer] °CCS ______[facility] °LAPAROSCOPIC SURGERY: POST OP INSTRUCTIONS °Always review your discharge instruction sheet given to you by the facility where your surgery was performed. °IF YOU HAVE DISABILITY OR FAMILY LEAVE FORMS, YOU MUST BRING THEM TO THE OFFICE FOR PROCESSING.   °DO NOT GIVE THEM TO YOUR DOCTOR. ° °1. A prescription for pain medication may be given to you upon discharge.  Take your pain medication as prescribed, if needed.  If narcotic   pain medicine is not needed, then you may take acetaminophen (Tylenol) or ibuprofen (Advil) as needed. °2. Take your usually prescribed medications unless otherwise directed. °3. If you need a refill on your pain medication, please contact your pharmacy.  They will contact our office to request authorization. Prescriptions will not be filled after 5pm or on  week-ends. °4. You should follow a light diet the first few days after arrival home, such as soup and crackers, etc.  Be sure to include lots of fluids daily. °5. Most patients will experience some swelling and bruising in the area of the incisions.  Ice packs will help.  Swelling and bruising can take several days to resolve.  °6. It is common to experience some constipation if taking pain medication after surgery.  Increasing fluid intake and taking a stool softener (such as Colace) will usually help or prevent this problem from occurring.  A mild laxative (Milk of Magnesia or Miralax) should be taken according to package instructions if there are no bowel movements after 48 hours. °7. Unless discharge instructions indicate otherwise, you may remove your bandages 24-48 hours after surgery, and you may shower at that time.  You may have steri-strips (small skin tapes) in place directly over the incision.  These strips should be left on the skin for 7-10 days.  If your surgeon used skin glue on the incision, you may shower in 24 hours.  The glue will flake off over the next 2-3 weeks.  Any sutures or staples will be removed at the office during your follow-up visit. °8. ACTIVITIES:  You may resume regular (light) daily activities beginning the next day--such as daily self-care, walking, climbing stairs--gradually increasing activities as tolerated.  You may have sexual intercourse when it is comfortable.  Refrain from any heavy lifting or straining until approved by your doctor. °a. You may drive when you are no longer taking prescription pain medication, you can comfortably wear a seatbelt, and you can safely maneuver your car and apply brakes. °b. RETURN TO WORK:  __________________________________________________________ °9. You should see your doctor in the office for a follow-up appointment approximately 2-3 weeks after your surgery.  Make sure that you call for this appointment within a day or two after you  arrive home to insure a convenient appointment time. °10. OTHER INSTRUCTIONS: __________________________________________________________________________________________________________________________ __________________________________________________________________________________________________________________________ °WHEN TO CALL YOUR DOCTOR: °1. Fever over 101.0 °2. Inability to urinate °3. Continued bleeding from incision. °4. Increased pain, redness, or drainage from the incision. °5. Increasing abdominal pain ° °The clinic staff is available to answer your questions during regular business hours.  Please don’t hesitate to call and ask to speak to one of the nurses for clinical concerns.  If you have a medical emergency, go to the nearest emergency room or call 911.  A surgeon from [facility] is always on call at the hospital. °[address], [address], [place], [place]  [address] ? [address] [address], [place], [place]   [address] °[phone] ? [phone] ? FAX [phone] °Web site: [email or web address] ° °

## 2015-02-14 NOTE — Anesthesia Procedure Notes (Signed)
Procedure Name: Intubation Date/Time: 02/14/2015 9:58 AM Performed by: Carney Living Pre-anesthesia Checklist: Patient identified, Emergency Drugs available, Suction available, Patient being monitored and Timeout performed Patient Re-evaluated:Patient Re-evaluated prior to inductionOxygen Delivery Method: Circle system utilized Preoxygenation: Pre-oxygenation with 100% oxygen Intubation Type: IV induction Ventilation: Mask ventilation without difficulty Laryngoscope Size: Mac and 4 Grade View: Grade II Tube type: Oral Tube size: 7.5 mm Number of attempts: 1 Airway Equipment and Method: Stylet Placement Confirmation: ETT inserted through vocal cords under direct vision,  positive ETCO2 and breath sounds checked- equal and bilateral Secured at: 23 cm Tube secured with: Tape Dental Injury: Teeth and Oropharynx as per pre-operative assessment

## 2015-02-14 NOTE — Transfer of Care (Signed)
Immediate Anesthesia Transfer of Care Note  Patient: [name] [name]  Procedure(s) Performed: Procedure(s): LAPAROSCOPIC CHOLECYSTECTOMY WITH INTRAOPERATIVE CHOLANGIOGRAM (N/A)  Patient Location: PACU  Anesthesia Type:General  Level of Consciousness: awake, alert , oriented and patient cooperative  Airway & Oxygen Therapy: Patient Spontanous Breathing and Patient connected to nasal cannula oxygen  Post-op Assessment: Report given to RN, Post -op Vital signs reviewed and stable and Patient moving all extremities X 4  Post vital signs: Reviewed and stable  Last Vitals:  Filed Vitals:   02/14/15 0852 02/14/15 1110  BP: 150/92   Pulse: 90   Temp: 37 C 36.4 C  Resp: 18     Complications: No apparent anesthesia complications

## 2015-02-15 ENCOUNTER — Encounter (HOSPITAL_COMMUNITY): Payer: Self-pay | Admitting: Surgery

## 2015-02-15 DIAGNOSIS — K8000 Calculus of gallbladder with acute cholecystitis without obstruction: Secondary | ICD-10-CM

## 2015-02-15 HISTORY — DX: Calculus of gallbladder with acute cholecystitis without obstruction: K80.00

## 2015-02-15 MED ORDER — ACETAMINOPHEN 325 MG PO TABS
650.0000 mg | ORAL_TABLET | Freq: Four times a day (QID) | ORAL | Status: DC | PRN
  Administered 2015-02-15: 650 mg via ORAL
  Filled 2015-02-15: qty 2

## 2015-02-15 MED ORDER — OXYCODONE-ACETAMINOPHEN 5-325 MG PO TABS: 1.0000 | ORAL_TABLET | Freq: Four times a day (QID) | ORAL | Status: DC | PRN

## 2015-02-15 MED ORDER — DOCUSATE SODIUM 100 MG PO CAPS
100.0000 mg | ORAL_CAPSULE | Freq: Once | ORAL | Status: AC
  Administered 2015-02-15: 100 mg via ORAL
  Filled 2015-02-15: qty 1

## 2015-02-15 NOTE — Progress Notes (Signed)
[name] to be D/C'd Home per MD order.  Discussed with the patient and all questions fully answered.  VSS, Lap Chole sites clean dry and intact. IV catheter discontinued intact. Site without signs and symptoms of complications. Dressing and pressure applied.  An After Visit Summary was printed and given to the patient. Patient received prescription.  D/c education completed with patient/family including follow up instructions, medication list, d/c activities limitations if indicated, with other d/c instructions as indicated by MD - patient able to verbalize understanding, all questions fully answered.   Patient instructed to return to ED, call 911, or call MD for any changes in condition.   Patient refsued escorted via [place], and D/C home via private auto.  L'ESPERANCE, Wilhelmenia Addis C 02/15/2015 10:16 AM

## 2015-02-15 NOTE — Discharge Summary (Signed)
[facility] Discharge Summary   Patient ID: [name] [name] MRN: [id number] DOB/AGE: [birth year] 44 y.o.  Admit date: 02/13/2015 Discharge date: 02/15/2015  Admitting Diagnosis: Symptomatic cholelithiasis  Discharge Diagnosis Patient Active Problem List   Diagnosis Date Noted  . Calculus of gallbladder with acute cholecystitis without obstruction 02/15/2015  . Cholelithiasis 02/13/2015  . Gallstone 02/10/2015  . Chest wall pain 02/10/2015  . Essential hypertension 07/31/2014  . Right knee pain 12/01/2012  . Skin tag 05/28/2012  . Conjunctivitis 10/18/2010  . GASTROENTERITIS WITHOUT DEHYDRATION 05/15/2010  . ANXIETY 11/14/2009  . DEPENDENT EDEMA 11/09/2008  . ANGER 10/31/2006  . COLONIC POLYPS 10/21/2006  . ADHD 10/21/2006  . GERD 10/21/2006  . MORBID OBESITY 07/30/2006  . HYPERTENSION, BENIGN ESSENTIAL 07/30/2006  . SLEEP APNEA 07/30/2006  . HEMORRHOIDS, EXTERNAL 10/03/2005    Consultants None  Imaging: Dg Cholangiogram Operative  02/14/2015  CLINICAL DATA:  44 year old male with a history of cholelithiasis EXAM: INTRAOPERATIVE CHOLANGIOGRAM TECHNIQUE: Cholangiographic images from the C-arm fluoroscopic device were submitted for interpretation post-operatively. Please see the procedural report for the amount of contrast and the fluoroscopy time utilized. COMPARISON:  Nuclear medicine study 02/13/2015, ultrasound 02/13/2015 FINDINGS: Surgical instruments project over the upper abdomen. There is cannulation of the cystic duct/gallbladder neck, with antegrade infusion of contrast. Caliber of the extrahepatic ductal system within normal limits. No large filling defect identified. Free flow of contrast across the ampulla. IMPRESSION: Intraoperative cholangiogram demonstrates extrahepatic biliary ducts of unremarkable caliber, with no large filling defect identified. Free flow of contrast across the ampulla. Please refer to the dictated operative report for full  details of intraoperative findings and procedure Signed, Dulcy Fanny. Earleen Newport, DO [facility] Electronically Signed   By: Corrie Mckusick D.O.   On: 02/14/2015 12:43   Nm Hepatobiliary Including Gb  02/13/2015  CLINICAL DATA:  44 year old male with cholelithiasis on ultrasound and right upper quadrant pain. Initial encounter. EXAM: NUCLEAR MEDICINE HEPATOBILIARY IMAGING TECHNIQUE: Sequential images of the abdomen were obtained out to 60 minutes following intravenous administration of radiopharmaceutical. RADIOPHARMACEUTICALS:  5.2 MCi Tc-56m  Choletec IV COMPARISON:  Ultrasound 0708 hours today, and earlier. FINDINGS: Prompt radiotracer uptake by the liver and clearance of the blood pool. Prompt appearance of the gallbladder by 10 minutes, and gallbladder activity increases over the exam. CBD activity by 15 minutes, and small bowel activity by 30-35 minutes. Radiotracer activity washes out of the liver over the course of the study. IMPRESSION: Negative study; patent cystic duct and CBD. Gallbladder activity by 10 minutes and small bowel activity by 30 minutes. Electronically Signed   By: Genevie Ann M.D.   On: 02/13/2015 17:08    Procedures Dr. Ninfa Linden (02/15/15) - Laparoscopic Cholecystectomy with neg [facility], In  Hospital Course:  [name] [name], 44 y/o male, was in his usual state of health until about 1 month ago when he noticed some mild occasional back pain and nausea. This did not seem to be associated with eating. About 10-14d ago he again developed right back pain but this did not go away. Instead it steadily increased in intensity, radiating to his right shoulder and arm. He is unable to take a deep breath because of the pain. It is exacerbated by movement and somewhat by eating.   Workup showed symptomatic cholelithiasis.  Patient was admitted and underwent procedure listed above.  Tolerated procedure well and was transferred to the floor.  Diet was advanced  as tolerated.  On POD #1, the patient was voiding well,  tolerating diet, ambulating well, pain well controlled, vital signs stable, incisions c/d/i and felt stable for discharge home.  Patient will follow up in our office in 1 weeks and knows to call with questions or concerns.  Advised not to drive while on the pain medications.  Return to work note given.   Physical Exam: General:  Alert, NAD, pleasant, comfortable Abd:  Soft, ND, mild tenderness, incisions C/D/I    Medication List    STOP taking these medications        HYDROcodone-acetaminophen 5-325 MG tablet  Commonly known as:  NORCO/VICODIN      TAKE these medications        amLODipine 5 MG tablet  Commonly known as:  NORVASC  Take 1 tablet (5 mg total) by mouth daily.     omeprazole 20 MG capsule  Commonly known as:  PRILOSEC  Take 20 mg by mouth daily.     oxyCODONE-acetaminophen 5-325 MG tablet  Commonly known as:  PERCOCET/ROXICET  Take 1-2 tablets by mouth every 6 (six) hours as needed for moderate pain or severe pain.         Follow-up Information    Follow up with [name] 02/23/2015.   Specialty:  General Surgery   Why:  Your appointment is at 11:30, be at the office 30 minutes early for check in.     Contact information:   [place] [address] [phone]       Signed: Nat Christen, [facility] [phone]  02/15/2015, 9:04 AM

## 2015-02-16 ENCOUNTER — Emergency Department (HOSPITAL_COMMUNITY): Payer: BC Managed Care – PPO

## 2015-02-16 ENCOUNTER — Emergency Department (HOSPITAL_COMMUNITY)
Admission: EM | Admit: 2015-02-16 | Discharge: 2015-02-16 | Disposition: A | Discharge status: Home / Self Care | Payer: BC Managed Care – PPO | Attending: Emergency Medicine | Admitting: Emergency Medicine

## 2015-02-16 ENCOUNTER — Encounter (HOSPITAL_COMMUNITY): Payer: Self-pay | Admitting: Emergency Medicine

## 2015-02-16 DIAGNOSIS — Z79899 Other long term (current) drug therapy: Secondary | ICD-10-CM | POA: Insufficient documentation

## 2015-02-16 DIAGNOSIS — R0602 Shortness of breath: Secondary | ICD-10-CM | POA: Insufficient documentation

## 2015-02-16 DIAGNOSIS — R079 Chest pain, unspecified: Secondary | ICD-10-CM | POA: Diagnosis present

## 2015-02-16 DIAGNOSIS — Z880 Allergy status to penicillin: Secondary | ICD-10-CM | POA: Diagnosis not present

## 2015-02-16 DIAGNOSIS — I10 Essential (primary) hypertension: Secondary | ICD-10-CM | POA: Diagnosis not present

## 2015-02-16 DIAGNOSIS — Z8669 Personal history of other diseases of the nervous system and sense organs: Secondary | ICD-10-CM | POA: Diagnosis not present

## 2015-02-16 DIAGNOSIS — E669 Obesity, unspecified: Secondary | ICD-10-CM | POA: Diagnosis not present

## 2015-02-16 DIAGNOSIS — K219 Gastro-esophageal reflux disease without esophagitis: Secondary | ICD-10-CM | POA: Insufficient documentation

## 2015-02-16 LAB — COMPREHENSIVE METABOLIC PANEL
ALK PHOS: 52 U/L (ref 38–126)
ALT: 59 U/L (ref 17–63)
AST: 40 U/L (ref 15–41)
Albumin: 3.8 g/dL (ref 3.5–5.0)
Anion gap: 7 (ref 5–15)
BILIRUBIN TOTAL: 0.6 mg/dL (ref 0.3–1.2)
BUN: 12 mg/dL (ref 6–20)
CALCIUM: 8.6 mg/dL — AB (ref 8.9–10.3)
CO2: 28 mmol/L (ref 22–32)
CREATININE: 1.24 mg/dL (ref 0.61–1.24)
Chloride: 104 mmol/L (ref 101–111)
GFR calc non Af Amer: >60 mL/min (ref >60)
GLUCOSE: 102 mg/dL — AB (ref 65–99)
Potassium: 3.9 mmol/L (ref 3.5–5.1)
Sodium: 139 mmol/L (ref 135–145)
TOTAL PROTEIN: 6.8 g/dL (ref 6.5–8.1)

## 2015-02-16 LAB — CBC
HCT: 39.9 % (ref 39.0–52.0)
Hemoglobin: 13.7 g/dL (ref 13.0–17.0)
MCH: 29.3 pg (ref 26.0–34.0)
MCHC: 34.3 g/dL (ref 30.0–36.0)
MCV: 85.4 fL (ref 78.0–100.0)
PLATELETS: 170 10*3/uL (ref 150–400)
RBC: 4.67 MIL/uL (ref 4.22–5.81)
RDW: 12.9 % (ref 11.5–15.5)
WBC: 7.3 10*3/uL (ref 4.0–10.5)

## 2015-02-16 LAB — I-STAT TROPONIN, ED
Troponin i, poc: 0 ng/mL (ref 0.00–0.08)
Troponin i, poc: 0 ng/mL (ref 0.00–0.08)

## 2015-02-16 LAB — D-DIMER, QUANTITATIVE: D-Dimer, Quant: 0.87 ug/mL-FEU — ABNORMAL HIGH (ref 0.00–0.50)

## 2015-02-16 MED ORDER — SODIUM CHLORIDE 0.9 % IV SOLN: INTRAVENOUS | Status: DC

## 2015-02-16 MED ORDER — IOHEXOL 350 MG/ML SOLN
100.0000 mL | Freq: Once | INTRAVENOUS | Status: AC | PRN
  Administered 2015-02-16: 100 mL via INTRAVENOUS

## 2015-02-16 MED ORDER — ASPIRIN 81 MG PO CHEW
324.0000 mg | CHEWABLE_TABLET | Freq: Once | ORAL | Status: AC
  Administered 2015-02-16: 324 mg via ORAL
  Filled 2015-02-16: qty 4

## 2015-02-16 NOTE — ED Provider Notes (Signed)
CSN: [id number]     Arrival date & time 02/16/15  1527 History   First MD Initiated Contact with Patient 02/16/15 1725     Chief Complaint  Patient presents with  . Chest Pain  . Shortness of Breath    HPI   [name] [name] is a 44 y.o. male with a PMH of GERD, HTN who presents to the ED with chest pain and shortness of breath, which he states started last night and has been constant since that time. He reports movement and deep inspiration exacerbate his pain. He tried taking aspirin with no significant symptom relief. He denies fever, chills, abdominal pain, nausea, vomiting, numbness, weakness, paresthesia. He reports he had a cholecystectomy on Monday with Dr. Ninfa Linden.   Past Medical History  Diagnosis Date  . GERD (gastroesophageal reflux disease)   . Hypertension   . Sleep apnea    Past Surgical History  Procedure Laterality Date  . Colonoscopy  10/16/2005    Polyps, external hemorrhoids  . Gastric restriction surgery  10/2010    sleeve- at [facility]  . Cholecystectomy N/A 02/14/2015    Procedure: LAPAROSCOPIC CHOLECYSTECTOMY WITH INTRAOPERATIVE CHOLANGIOGRAM;  Surgeon: Coralie Keens, MD;  Location: [facility] OR;  Service: General;  Laterality: N/A;   Family History  Problem Relation Age of Onset  . Diabetes Mellitus II Mother   . Breast cancer Mother   . Diabetes Mellitus II Father   . CAD Father   . Stroke Father   . Cancer Father    Social History  Substance Use Topics  . Smoking status: Former Research scientist (life sciences)  . Smokeless tobacco: Former Systems developer  . Alcohol Use: Yes     Comment: Occasional      Review of Systems  Constitutional: Negative for fever and chills.  Respiratory: Positive for shortness of breath.   Cardiovascular: Positive for chest pain.  Gastrointestinal: Negative for nausea, vomiting, abdominal pain, diarrhea and constipation.  Neurological: Negative for dizziness, syncope, weakness, light-headedness and numbness.  All other systems reviewed and are  negative.     Allergies  Bee venom; Codeine; Hornet venom; and Penicillins  Home Medications   Prior to Admission medications   Medication Sig Start Date End Date Taking? Authorizing Provider  amLODipine (NORVASC) 5 MG tablet Take 1 tablet (5 mg total) by mouth daily. 10/06/14  Yes Lucille Passy, MD  omeprazole (PRILOSEC) 20 MG capsule Take 20 mg by mouth daily.   Yes Historical Provider, MD  oxyCODONE-acetaminophen (PERCOCET/ROXICET) 5-325 MG tablet Take 1-2 tablets by mouth every 6 (six) hours as needed for moderate pain or severe pain. 02/15/15  Yes Megan N Baird, PA-C    BP 106/83 mmHg  Pulse 79  Temp(Src) 97.8 F (36.6 C) (Oral)  Resp 17  Ht 6\' 2"  (1.88 m)  Wt 202.032 kg  BMI 57.16 kg/m2  SpO2 97% Physical Exam  Constitutional: He is oriented to person, place, and time. No distress.  Obese male in no acute distress.  HENT:  Head: Normocephalic and atraumatic.  Right Ear: External ear normal.  Left Ear: External ear normal.  Nose: Nose normal.  Mouth/Throat: Uvula is midline, oropharynx is clear and moist and mucous membranes are normal.  Eyes: Conjunctivae, EOM and lids are normal. Pupils are equal, round, and reactive to light. Right eye exhibits no discharge. Left eye exhibits no discharge. No scleral icterus.  Neck: Normal range of motion. Neck supple.  Cardiovascular: Normal rate, regular rhythm, normal heart sounds, intact distal pulses and normal pulses.  Pulmonary/Chest: Effort normal and breath sounds normal. No respiratory distress. He has no wheezes. He has no rales.  Abdominal: Soft. Normal appearance and bowel sounds are normal. He exhibits no distension and no mass. There is no tenderness. There is no rigidity, no rebound and no guarding.  Musculoskeletal: Normal range of motion. He exhibits no edema or tenderness.  Neurological: He is alert and oriented to person, place, and time. He has normal strength. No cranial nerve deficit or sensory deficit.  Skin:  Skin is warm, dry and intact. No rash noted. He is not diaphoretic. No erythema. No pallor.  Abdominal incisions appear clean, dry, and intact.  Psychiatric: He has a normal mood and affect. His speech is normal and behavior is normal.  Nursing note and vitals reviewed.   ED Course  Procedures (including critical care time)  Labs Review Labs Reviewed  COMPREHENSIVE METABOLIC PANEL - Abnormal; Notable for the following:    Glucose, Bld 102 (*)    Calcium 8.6 (*)    All other components within normal limits  D-DIMER, QUANTITATIVE (NOT AT [facility]) - Abnormal; Notable for the following:    D-Dimer, Quant 0.87 (*)    All other components within normal limits  CBC  I-STAT TROPOININ, ED  Randolm Idol, ED    Imaging Review Dg Chest 2 View  02/16/2015  CLINICAL DATA:  Chest pain and shortness of breath today. Recently released from the hospital yesterday. History of cholecystectomy on Monday. EXAM: CHEST  2 VIEW COMPARISON:  Chest x-ray 02/03/2015. FINDINGS: The cardiac silhouette, mediastinal and hilar contours are within normal limits and stable. There is mild tortuosity of the thoracic aorta. The lungs are clear. No pleural effusion. The bony thorax is intact. IMPRESSION: No acute cardiopulmonary findings. Electronically Signed   By: Marijo Sanes M.D.   On: 02/16/2015 16:16   Ct Angio Chest Pe W/cm &/or Wo Cm  02/16/2015  CLINICAL DATA:  Chest pain and shortness of Breath. Discharged from the hospital yesterday after cholecystectomy. EXAM: CT ANGIOGRAPHY CHEST WITH CONTRAST TECHNIQUE: Multidetector CT imaging of the chest was performed using the standard protocol during bolus administration of intravenous contrast. Multiplanar CT image reconstructions and MIPs were obtained to evaluate the vascular anatomy. CONTRAST:  140mL OMNIPAQUE IOHEXOL 350 MG/ML SOLN COMPARISON:  02/09/2015 FINDINGS: Mediastinum/Nodes: No chest wall mass, supraclavicular or axillary adenopathy. Mild gynecomastia. The  heart is mildly enlarged but stable. No pericardial effusion. Stable area of pericardial calcification. The aorta is normal in caliber. No mediastinal or hilar mass or adenopathy. Small scattered lymph nodes are stable. The pulmonary arterial tree is fairly well opacified. No definite filling defects to suggest pulmonary embolism. Lungs/Pleura: Stable mild emphysematous changes. No acute pulmonary findings. No pleural effusion. No worrisome pulmonary lesions. Upper abdomen: No significant upper abdominal findings. 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 Small amount of postoperative fluid noted in the gallbladder fossa. Adjacent lymph nodes are stable. Musculoskeletal: No significant bony findings. Review of the MIP images confirms the above findings. IMPRESSION: 1. No CT findings for pulmonary embolism. 2. Early apical emphysematous changes.  No acute pulmonary findings. 3. Stable mild cardiac enlargement. Electronically Signed   By: Marijo Sanes M.D.   On: 02/16/2015 19:30   I have personally reviewed and evaluated these images and lab results as part of my medical decision-making.   EKG Interpretation   Date/Time:  Wednesday February 16 2015 15:31:37 EST Ventricular Rate:  91 PR Interval:  202 QRS Duration: 102 QT Interval:  364 QTC Calculation: 447 R Axis:   -10 Text Interpretation:  Normal sinus rhythm Low voltage QRS Incomplete right  bundle branch block Borderline ECG Confirmed by COOK  MD, BRIAN ([id number]) on  02/16/2015 6:51:32 PM      MDM   Final diagnoses:  Chest pain, unspecified chest pain type    44 year old male presents with chest pain and shortness of breath, which started yesterday. Of note, he recently underwent cholecystectomy with Dr. Ninfa Linden on Monday. Denies fever, chills, abdominal pain, nausea, vomiting, numbness, weakness, paresthesia.   Patient is afebrile. Vital signs stable. Heart regular rate and rhythm. Lungs clear to auscultation  bilaterally. Abdominal incisions clean, dry, and intact. No lower extremity edema.  CBC negative for leukocytosis or anemia. CMP unremarkable. EKG normal sinus rhythm, heart rate 91. Troponin negative. D-dimer elevated at 0.87. Chest x-ray negative for acute cardiopulmonary findings. Will obtain CT PE given elevated d-dimer.  Imaging negative for pulmonary embolism or acute pulmonary findings; reveals stable mild cardiac enlargement. Patient insisting that his surgeon Dr. Ninfa Linden be called. Advised patient I spoke with Dr. Hulen Skains, who is the on-call surgeon. Will obtain delta troponin.   Repeat troponin negative. Advised patient of results. Patient is nontoxic and well-appearing, feel he is stable for discharge at this time. Patient to follow up with PCP. Return precautions discussed. Patient verbalizes understanding and is in agreement with plan.  BP 106/83 mmHg  Pulse 79  Temp(Src) 97.8 F (36.6 C) (Oral)  Resp 17  Ht 6\' 2"  (1.88 m)  Wt 202.032 kg  BMI 57.16 kg/m2  SpO2 97%    Marella Chimes, PA-C 02/16/15 2032  Nat Christen, MD 02/16/15 2312

## 2015-02-16 NOTE — ED Provider Notes (Signed)
[name] [name] is a 44 y.o. male who presents c/o substernal chest pain onset last night.  Pain constant, worse with exertion and inspiration.  Pt reports cholecystectomy 3 days ago.  Pt with assoc SOB, CP.  NO diaphoresis, fever, chills, N/V.  No cardiac hx.    General: Awake, alert  HEENT: Atraumatic  Resp: Normal effort  Abd: Nondistended, laparoscopic incisions,   MSK: Moves all extremities  Skin: Warm and dry  MSE was initiated and I personally evaluated the patient and placed orders (if any) at  3:40 PM on February 16, 2015.    The patient appears stable so that the remainder of the MSE may be completed by another provider.  Discussed with the patient that exiting the department prior to completion of the work-up is AMA and there is no guarantee that there are no emergency medical conditions present.     Jarrett Soho Taheerah Guldin, PA-C 02/16/15 [place], MD 02/17/15 [phone]

## 2015-02-16 NOTE — ED Notes (Signed)
C/o pain to center of chest that is worse with deep inspiration.  Also reports sob.  Denies nausea, vomiting, diaphoresis.  Pt discharged from hospital yesterday after cholecystectomy on Monday.  Reports mild pain before being discharged from hospital but states pain worse after "messing with wood stove" yesterday.

## 2015-02-16 NOTE — Discharge Instructions (Signed)
1. Medications: usual home medications 2. Treatment: rest, drink plenty of fluids 3. Follow Up: please followup with your primary doctor for discussion of your diagnoses and further evaluation after today's visit; please return to the ER for severe pain, increased shortness of breath, new or worsening symptoms   Nonspecific Chest Pain It is often hard to find the cause of chest pain. There is always a chance that your pain could be related to something serious, such as a heart attack or a blood clot in your lungs. Chest pain can also be caused by conditions that are not life-threatening. If you have chest pain, it is very important to follow up with your doctor.  HOME CARE  If you were prescribed an antibiotic medicine, finish it all even if you start to feel better.  Avoid any activities that cause chest pain.  Do not use any tobacco products, including cigarettes, chewing tobacco, or electronic cigarettes. If you need help quitting, ask your doctor.  Do not drink alcohol.  Take medicines only as told by your doctor.  Keep all follow-up visits as told by your doctor. This is important. This includes any further testing if your chest pain does not go away.  Your doctor may tell you to keep your head raised (elevated) while you sleep.  Make lifestyle changes as told by your doctor. These may include:  Getting regular exercise. Ask your doctor to suggest some activities that are safe for you.  Eating a heart-healthy diet. Your doctor or a diet specialist (dietitian) can help you to learn healthy eating options.  Maintaining a healthy weight.  Managing diabetes, if necessary.  Reducing stress. GET HELP IF:  Your chest pain does not go away, even after treatment.  You have a rash with blisters on your chest.  You have a fever. GET HELP RIGHT AWAY IF:  Your chest pain is worse.  You have an increasing cough, or you cough up blood.  You have severe belly (abdominal)  pain.  You feel extremely weak.  You pass out (faint).  You have chills.  You have sudden, unexplained chest discomfort.  You have sudden, unexplained discomfort in your arms, back, neck, or jaw.  You have shortness of breath at any time.  You suddenly start to sweat, or your skin gets clammy.  You feel nauseous.  You vomit.  You suddenly feel light-headed or dizzy.  Your heart begins to beat quickly, or it feels like it is skipping beats. These symptoms may be an emergency. Do not wait to see if the symptoms will go away. Get medical help right away. Call your local emergency services (911 in the [place] Do not drive yourself to the hospital.   This information is not intended to replace advice given to you by your health care provider. Make sure you discuss any questions you have with your health care provider.   Document Released: 08/08/2007 Document Revised: 03/12/2014 Document Reviewed: 09/25/2013 Elsevier Interactive Patient Education [employer].

## 2015-04-26 ENCOUNTER — Encounter (INDEPENDENT_AMBULATORY_CARE_PROVIDER_SITE_OTHER): Payer: Self-pay

## 2015-04-26 ENCOUNTER — Ambulatory Visit (INDEPENDENT_AMBULATORY_CARE_PROVIDER_SITE_OTHER): Payer: BC Managed Care – PPO | Admitting: Primary Care

## 2015-04-26 ENCOUNTER — Encounter: Payer: Self-pay | Admitting: Primary Care

## 2015-04-26 VITALS — BP 138/80 | HR 88 | Temp 97.5°F | Wt >= 6400 oz

## 2015-04-26 DIAGNOSIS — R110 Nausea: Secondary | ICD-10-CM

## 2015-04-26 DIAGNOSIS — K219 Gastro-esophageal reflux disease without esophagitis: Secondary | ICD-10-CM | POA: Diagnosis not present

## 2015-04-26 MED ORDER — ONDANSETRON 4 MG PO TBDP: 4.0000 mg | ORAL_TABLET | Freq: Three times a day (TID) | ORAL | Status: DC | PRN

## 2015-04-26 MED ORDER — OMEPRAZOLE 20 MG PO CPDR: 20.0000 mg | DELAYED_RELEASE_CAPSULE | Freq: Every day | ORAL | Status: DC

## 2015-04-26 NOTE — Progress Notes (Signed)
Subjective:    Patient ID: [name] [name], male    DOB: [birth year], 45 y.o.   MRN: [id number]  HPI  Mr. Vanegas is a 45 year old male who presents today with a chief complaint of diarrhea. He also reports nausea, body aches, chills, fatigue, generalized abdominal discomfort. His symptoms began yesterday at work with a sudden onset. Denies vomiting, fevers, cough. He's currently experiencing 3-4 epidodes of diarrhea daily for 2 days. He's not taken anything OTC. He's able to take in liquids and solids but does experience nausea.   Review of Systems  Constitutional: Positive for fatigue. Negative for fever and chills.  HENT: Negative for congestion.   Respiratory: Negative for cough and shortness of breath.   Gastrointestinal: Positive for nausea and diarrhea. Negative for vomiting.       Past Medical History  Diagnosis Date  . GERD (gastroesophageal reflux disease)   . Hypertension   . Sleep apnea     Social History   Social History  . Marital Status: Married    Spouse Name: N/A  . Number of Children: 1  . Years of Education: N/A   Occupational History  .     Social History Main Topics  . Smoking status: Former Research scientist (life sciences)  . Smokeless tobacco: Former Systems developer  . Alcohol Use: Yes     Comment: Occasional  . Drug Use: No  . Sexual Activity: Yes   Other Topics Concern  . Not on file   Social History Narrative    Past Surgical History  Procedure Laterality Date  . Colonoscopy  10/16/2005    Polyps, external hemorrhoids  . Gastric restriction surgery  10/2010    sleeve- at [facility]  . Cholecystectomy N/A 02/14/2015    Procedure: LAPAROSCOPIC CHOLECYSTECTOMY WITH INTRAOPERATIVE CHOLANGIOGRAM;  Surgeon: Coralie Keens, MD;  Location: [facility] OR;  Service: General;  Laterality: N/A;    Family History  Problem Relation Age of Onset  . Diabetes Mellitus II Mother   . Breast cancer Mother   . Diabetes Mellitus II Father   . CAD Father   . Stroke Father   . Cancer Father      Allergies  Allergen Reactions  . Bee Venom Anaphylaxis  . Codeine Anaphylaxis    Tolerates dilaudid  . Hornet Venom Anaphylaxis  . Penicillins Hives    Has patient had a PCN reaction causing immediate rash, facial/tongue/throat swelling, SOB or lightheadedness with hypotensionYES Has patient had a PCN reaction causing severe rash involving mucus membranes or skin necrosis: NO Has patient had a PCN reaction that required hospitalization NO Has patient had a PCN reaction occurring within the last 10 years: NO If all of the above answers are "NO", then may proceed with Cephalosporin use.    Current Outpatient Prescriptions on File Prior to Visit  Medication Sig Dispense Refill  . amLODipine (NORVASC) 5 MG tablet Take 1 tablet (5 mg total) by mouth daily. 90 tablet 3  . oxyCODONE-acetaminophen (PERCOCET/ROXICET) 5-325 MG tablet Take 1-2 tablets by mouth every 6 (six) hours as needed for moderate pain or severe pain. 40 tablet 0   No current facility-administered medications on file prior to visit.    BP 138/80 mmHg  Pulse 88  Temp(Src) 97.5 F (36.4 C) (Oral)  Wt 434 lb 12.8 oz (197.224 kg)  SpO2 98%    Objective:   Physical Exam  Constitutional: He appears well-nourished.  Cardiovascular: Normal rate and regular rhythm.   Pulmonary/Chest: Effort normal and breath sounds normal.  Abdominal:  Soft. Bowel sounds are normal. There is tenderness in the epigastric area and left upper quadrant.  Skin: Skin is warm and dry.          Assessment & Plan:  Viral Diarrhea:  Sudden onset of chills, body aches, diarrhea, fatigue yesterday. Overall feeling tired. No vomiting. Tolerating PO intake. Does not appear dehydrated, vitals stable. RX for Zofran provided so he can work on rehydrating. No imodium. Slowly advance diet. Return precautions provided.

## 2015-04-26 NOTE — Progress Notes (Signed)
Pre visit review using our clinic review tool, if applicable. No additional management support is needed unless otherwise documented below in the visit note. 

## 2015-04-26 NOTE — Patient Instructions (Signed)
Start Zofran (ondansetron) tablets for nausea. Melt one tablet in your mouth once every 8 hours as needed for nausea.  It's important for you to rehydrate and stay hydrated with water. Try to drink 64 ounces of water daily.  Do not take Imodium or other anti-diarrhea medications.  Please notify us if you develop fevers of 101, abdominal pain, vomiting, or if symptoms become worse.  Slowly advance your diet as tolerated. Take a look at the information below.  It was a pleasure meeting you!  Food Choices to Help Relieve Diarrhea, Adult When you have diarrhea, the foods you eat and your eating habits are very important. Choosing the right foods and drinks can help relieve diarrhea. Also, because diarrhea can last up to 7 days, you need to replace lost fluids and electrolytes (such as sodium, potassium, and chloride) in order to help prevent dehydration.  WHAT GENERAL GUIDELINES DO I NEED TO FOLLOW?  Slowly drink 1 cup (8 oz) of fluid for each episode of diarrhea. If you are getting enough fluid, your urine will be clear or pale yellow.  Eat starchy foods. Some good choices include white rice, white toast, pasta, low-fiber cereal, baked potatoes (without the skin), saltine crackers, and bagels.  Avoid large servings of any cooked vegetables.  Limit fruit to two servings per day. A serving is  cup or 1 small piece.  Choose foods with less than 2 g of fiber per serving.  Limit fats to less than 8 tsp (38 g) per day.  Avoid fried foods.  Eat foods that have probiotics in them. Probiotics can be found in certain dairy products.  Avoid foods and beverages that may increase the speed at which food moves through the stomach and intestines (gastrointestinal tract). Things to avoid include:  High-fiber foods, such as dried fruit, raw fruits and vegetables, nuts, seeds, and whole grain foods.  Spicy foods and high-fat foods.  Foods and beverages sweetened with high-fructose corn syrup,  honey, or sugar alcohols such as xylitol, sorbitol, and mannitol. WHAT FOODS ARE RECOMMENDED? Grains White rice. White, [place], or pita breads (fresh or toasted), including plain rolls, buns, or bagels. White pasta. Saltine, soda, or graham crackers. Pretzels. Low-fiber cereal. Cooked cereals made with water (such as cornmeal, farina, or cream cereals). Plain muffins. Matzo. Melba toast. Zwieback.  Vegetables Potatoes (without the skin). Strained tomato and vegetable juices. Most well-cooked and canned vegetables without seeds. Tender lettuce. Fruits Cooked or canned applesauce, apricots, cherries, fruit cocktail, grapefruit, peaches, pears, or plums. Fresh bananas, apples without skin, cherries, grapes, cantaloupe, grapefruit, peaches, oranges, or plums.  Meat and Other Protein Products Baked or boiled chicken. Eggs. Tofu. Fish. Seafood. Smooth peanut butter. Ground or well-cooked tender beef, ham, veal, lamb, pork, or poultry.  Dairy Plain yogurt, kefir, and unsweetened liquid yogurt. Lactose-free milk, buttermilk, or soy milk. Plain hard cheese. Beverages Sport drinks. Clear broths. Diluted fruit juices (except prune). Regular, caffeine-free sodas such as ginger ale. Water. Decaffeinated teas. Oral rehydration solutions. Sugar-free beverages not sweetened with sugar alcohols. Other Bouillon, broth, or soups made from recommended foods.  The items listed above may not be a complete list of recommended foods or beverages. Contact your dietitian for more options. WHAT FOODS ARE NOT RECOMMENDED? Grains Whole grain, whole wheat, bran, or rye breads, rolls, pastas, crackers, and cereals. Wild or brown rice. Cereals that contain more than 2 g of fiber per serving. Corn tortillas or taco shells. Cooked or dry oatmeal. Granola. Popcorn. Vegetables Raw vegetables. Cabbage,  broccoli, Brussels sprouts, artichokes, baked beans, beet greens, corn, kale, legumes, peas, sweet potatoes, and yams. Potato  skins. Cooked spinach and cabbage. Fruits Dried fruit, including raisins and dates. Raw fruits. Stewed or dried prunes. Fresh apples with skin, apricots, mangoes, pears, raspberries, and strawberries.  Meat and Other Protein Products Chunky peanut butter. Nuts and seeds. Beans and lentils. Berniece Salines.  Dairy High-fat cheeses. Milk, chocolate milk, and beverages made with milk, such as milk shakes. Cream. Ice cream. Sweets and Desserts Sweet rolls, doughnuts, and sweet breads. Pancakes and waffles. Fats and Oils Butter. Cream sauces. Margarine. Salad oils. Plain salad dressings. Olives. Avocados.  Beverages Caffeinated beverages (such as coffee, tea, soda, or energy drinks). Alcoholic beverages. Fruit juices with pulp. Prune juice. Soft drinks sweetened with high-fructose corn syrup or sugar alcohols. Other Coconut. Hot sauce. Chili powder. Mayonnaise. Gravy. Cream-based or milk-based soups.  The items listed above may not be a complete list of foods and beverages to avoid. Contact your dietitian for more information. WHAT SHOULD I DO IF I BECOME DEHYDRATED? Diarrhea can sometimes lead to dehydration. Signs of dehydration include dark urine and dry mouth and skin. If you think you are dehydrated, you should rehydrate with an oral rehydration solution. These solutions can be purchased at pharmacies, retail stores, or online.  Drink -1 cup (120-240 mL) of oral rehydration solution each time you have an episode of diarrhea. If drinking this amount makes your diarrhea worse, try drinking smaller amounts more often. For example, drink 1-3 tsp (5-15 mL) every 5-10 minutes.  A general rule for staying hydrated is to drink 1-2 L of fluid per day. Talk to your health care provider about the specific amount you should be drinking each day. Drink enough fluids to keep your urine clear or pale yellow.   This information is not intended to replace advice given to you by your health care provider. Make sure you  discuss any questions you have with your health care provider.   Document Released: 05/12/2003 Document Revised: 03/12/2014 Document Reviewed: 01/12/2013 Elsevier Interactive Patient Education [employer].

## 2015-05-02 ENCOUNTER — Telehealth: Payer: Self-pay

## 2015-05-02 ENCOUNTER — Telehealth: Payer: Self-pay | Admitting: Primary Care

## 2015-05-02 NOTE — Telephone Encounter (Signed)
Okay to extend work note for a few more days, but if he's not improved, please have him follow up with PCP for further evaluation.

## 2015-05-02 NOTE — Telephone Encounter (Signed)
Message left for patient to return my call.  

## 2015-05-02 NOTE — Telephone Encounter (Signed)
Called and asked patient of [name] [name]'s comments. Patient stated that he does not have a fever. He was nausea, did have some vomiting then took some meds, and yes, have some abdominal pain. He is getting plenty of fluid intake. He does not feel like eating much but try to.

## 2015-05-02 NOTE — Telephone Encounter (Signed)
Called and notified patient of Kate's comments. Patient verbalized understanding.  Left work note in the front office for patient to pick up.

## 2015-05-02 NOTE — Telephone Encounter (Signed)
Any fevers?  Vomiting?  Abdominal Pain?  Is he eating and drinking okay?

## 2015-05-02 NOTE — Telephone Encounter (Signed)
Pt left v/m; pt was seen 04/26/15 and received note to be out of work until 04/29/15. Pt is not feeling any better and wants to know if work note can be extended or will pt need to be rechecked. Pt request cb. Pt is supposed to work on 05/03/15 and request cb ASAP.

## 2015-05-11 ENCOUNTER — Ambulatory Visit: Payer: Self-pay | Admitting: Family Medicine

## 2015-05-11 ENCOUNTER — Ambulatory Visit (INDEPENDENT_AMBULATORY_CARE_PROVIDER_SITE_OTHER): Payer: BC Managed Care – PPO | Admitting: Primary Care

## 2015-05-11 VITALS — BP 144/86 | HR 86 | Temp 98.7°F | Wt >= 6400 oz

## 2015-05-11 DIAGNOSIS — R05 Cough: Secondary | ICD-10-CM

## 2015-05-11 DIAGNOSIS — R062 Wheezing: Secondary | ICD-10-CM

## 2015-05-11 DIAGNOSIS — R059 Cough, unspecified: Secondary | ICD-10-CM

## 2015-05-11 LAB — POC INFLUENZA A&B (BINAX/QUICKVUE)
INFLUENZA A, POC: NEGATIVE
INFLUENZA B, POC: NEGATIVE

## 2015-05-11 MED ORDER — AZITHROMYCIN 250 MG PO TABS: ORAL_TABLET | ORAL | Status: DC

## 2015-05-11 MED ORDER — ALBUTEROL SULFATE HFA 108 (90 BASE) MCG/ACT IN AERS: 2.0000 | INHALATION_SPRAY | Freq: Four times a day (QID) | RESPIRATORY_TRACT | Status: DC | PRN
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
## 2015-05-11 NOTE — Patient Instructions (Addendum)
Start Azithromycin antibiotics. Take 2 tablets by mouth today, then 1 tablet daily for 4 additional days.  Cough/Congestion: Try taking Mucinex DM. This will help loosen up the mucous in your chest. Ensure you take this medication with a full glass of water.  You may also try Robituss DM or Delsym DM for cough.  Ensure you are staying hydrated with water.   It was a pleasure to see you today!

## 2015-05-11 NOTE — Progress Notes (Signed)
Pre visit review using our clinic review tool, if applicable. No additional management support is needed unless otherwise documented below in the visit note. 

## 2015-05-16 ENCOUNTER — Telehealth: Payer: Self-pay | Admitting: Family Medicine

## 2015-05-16 NOTE — Telephone Encounter (Signed)
Work note done.Called and notified patient of Kate's comments. Patient verbalized understanding.

## 2015-05-16 NOTE — Telephone Encounter (Signed)
No need to come in. [name] [name], will you please excuse him from work this past weekend and call when note is ready? Thanks.

## 2015-05-16 NOTE — Telephone Encounter (Signed)
Patient was out of work this weekend due to being sick.  He is also out today but will go back tomorrow.  He wants to know if [name] can give him a note to go back tomorrow or does he need to be seen again?

## 2015-05-31 ENCOUNTER — Inpatient Hospital Stay (HOSPITAL_COMMUNITY)
Admission: EM | Admit: 2015-05-31 | Discharge: 2015-06-03 | DRG: 389 | Disposition: A | Discharge status: Home / Self Care | Payer: BC Managed Care – PPO | Attending: Internal Medicine | Admitting: Internal Medicine

## 2015-05-31 ENCOUNTER — Emergency Department (HOSPITAL_COMMUNITY): Payer: BC Managed Care – PPO

## 2015-05-31 ENCOUNTER — Encounter (HOSPITAL_COMMUNITY): Payer: Self-pay | Admitting: Emergency Medicine

## 2015-05-31 DIAGNOSIS — K649 Unspecified hemorrhoids: Secondary | ICD-10-CM | POA: Diagnosis present

## 2015-05-31 DIAGNOSIS — D696 Thrombocytopenia, unspecified: Secondary | ICD-10-CM | POA: Diagnosis present

## 2015-05-31 DIAGNOSIS — D3502 Benign neoplasm of left adrenal gland: Secondary | ICD-10-CM | POA: Diagnosis present

## 2015-05-31 DIAGNOSIS — Z880 Allergy status to penicillin: Secondary | ICD-10-CM

## 2015-05-31 DIAGNOSIS — I10 Essential (primary) hypertension: Secondary | ICD-10-CM | POA: Diagnosis not present

## 2015-05-31 DIAGNOSIS — K529 Noninfective gastroenteritis and colitis, unspecified: Secondary | ICD-10-CM | POA: Diagnosis present

## 2015-05-31 DIAGNOSIS — K210 Gastro-esophageal reflux disease with esophagitis: Secondary | ICD-10-CM | POA: Diagnosis present

## 2015-05-31 DIAGNOSIS — Z885 Allergy status to narcotic agent status: Secondary | ICD-10-CM

## 2015-05-31 DIAGNOSIS — R739 Hyperglycemia, unspecified: Secondary | ICD-10-CM | POA: Diagnosis present

## 2015-05-31 DIAGNOSIS — K219 Gastro-esophageal reflux disease without esophagitis: Secondary | ICD-10-CM | POA: Diagnosis present

## 2015-05-31 DIAGNOSIS — Z6841 Body Mass Index (BMI) 40.0 and over, adult: Secondary | ICD-10-CM

## 2015-05-31 DIAGNOSIS — K5660 Unspecified intestinal obstruction: Secondary | ICD-10-CM | POA: Diagnosis not present

## 2015-05-31 DIAGNOSIS — G4733 Obstructive sleep apnea (adult) (pediatric): Secondary | ICD-10-CM | POA: Diagnosis present

## 2015-05-31 DIAGNOSIS — K760 Fatty (change of) liver, not elsewhere classified: Secondary | ICD-10-CM | POA: Diagnosis present

## 2015-05-31 DIAGNOSIS — K567 Ileus, unspecified: Secondary | ICD-10-CM | POA: Diagnosis present

## 2015-05-31 DIAGNOSIS — D124 Benign neoplasm of descending colon: Secondary | ICD-10-CM | POA: Diagnosis present

## 2015-05-31 DIAGNOSIS — K9589 Other complications of other bariatric procedure: Secondary | ICD-10-CM | POA: Diagnosis present

## 2015-05-31 DIAGNOSIS — Z9884 Bariatric surgery status: Secondary | ICD-10-CM

## 2015-05-31 DIAGNOSIS — Z9119 Patient's noncompliance with other medical treatment and regimen: Secondary | ICD-10-CM

## 2015-05-31 DIAGNOSIS — Z91030 Bee allergy status: Secondary | ICD-10-CM

## 2015-05-31 DIAGNOSIS — K56609 Unspecified intestinal obstruction, unspecified as to partial versus complete obstruction: Secondary | ICD-10-CM

## 2015-05-31 DIAGNOSIS — Y832 Surgical operation with anastomosis, bypass or graft as the cause of abnormal reaction of the patient, or of later complication, without mention of misadventure at the time of the procedure: Secondary | ICD-10-CM | POA: Diagnosis present

## 2015-05-31 DIAGNOSIS — R109 Unspecified abdominal pain: Secondary | ICD-10-CM | POA: Diagnosis not present

## 2015-05-31 DIAGNOSIS — F909 Attention-deficit hyperactivity disorder, unspecified type: Secondary | ICD-10-CM | POA: Diagnosis present

## 2015-05-31 DIAGNOSIS — D72819 Decreased white blood cell count, unspecified: Secondary | ICD-10-CM | POA: Diagnosis present

## 2015-05-31 DIAGNOSIS — Z87891 Personal history of nicotine dependence: Secondary | ICD-10-CM

## 2015-05-31 DIAGNOSIS — Z79899 Other long term (current) drug therapy: Secondary | ICD-10-CM

## 2015-05-31 HISTORY — DX: Fatty (change of) liver, not elsewhere classified: K76.0

## 2015-05-31 HISTORY — DX: Unspecified chronic bronchitis: J42

## 2015-05-31 HISTORY — DX: Unspecified intestinal obstruction, unspecified as to partial versus complete obstruction: K56.609

## 2015-05-31 HISTORY — DX: Obesity, unspecified: E66.9

## 2015-05-31 LAB — CBC
HCT: 45.5 % (ref 39.0–52.0)
HEMOGLOBIN: 15.7 g/dL (ref 13.0–17.0)
MCH: 29 pg (ref 26.0–34.0)
MCHC: 34.5 g/dL (ref 30.0–36.0)
MCV: 83.9 fL (ref 78.0–100.0)
Platelets: 180 10*3/uL (ref 150–400)
RBC: 5.42 MIL/uL (ref 4.22–5.81)
RDW: 12.5 % (ref 11.5–15.5)
WBC: 7.3 10*3/uL (ref 4.0–10.5)

## 2015-05-31 LAB — URINALYSIS, ROUTINE W REFLEX MICROSCOPIC
Glucose, UA: NEGATIVE mg/dL
Hgb urine dipstick: NEGATIVE
KETONES UR: 15 mg/dL — AB
LEUKOCYTES UA: NEGATIVE
NITRITE: NEGATIVE
PH: 5.5 (ref 5.0–8.0)
Protein, ur: NEGATIVE mg/dL
SPECIFIC GRAVITY, URINE: 1.027 (ref 1.005–1.030)

## 2015-05-31 LAB — COMPREHENSIVE METABOLIC PANEL
ALK PHOS: 61 U/L (ref 38–126)
ALT: 36 U/L (ref 17–63)
AST: 26 U/L (ref 15–41)
Albumin: 4.3 g/dL (ref 3.5–5.0)
Anion gap: 11 (ref 5–15)
BUN: 9 mg/dL (ref 6–20)
CALCIUM: 9.2 mg/dL (ref 8.9–10.3)
CHLORIDE: 104 mmol/L (ref 101–111)
CO2: 21 mmol/L — ABNORMAL LOW (ref 22–32)
CREATININE: 1.12 mg/dL (ref 0.61–1.24)
Glucose, Bld: 121 mg/dL — ABNORMAL HIGH (ref 65–99)
Potassium: 4 mmol/L (ref 3.5–5.1)
Sodium: 136 mmol/L (ref 135–145)
Total Bilirubin: 1.1 mg/dL (ref 0.3–1.2)
Total Protein: 7.6 g/dL (ref 6.5–8.1)

## 2015-05-31 LAB — LIPASE, BLOOD: LIPASE: 21 U/L (ref 11–51)

## 2015-05-31 MED ORDER — SODIUM CHLORIDE 0.9 % IV BOLUS (SEPSIS)
1000.0000 mL | Freq: Once | INTRAVENOUS | Status: AC
  Administered 2015-05-31: 1000 mL via INTRAVENOUS

## 2015-05-31 MED ORDER — ACETAMINOPHEN 650 MG RE SUPP: 650.0000 mg | Freq: Four times a day (QID) | RECTAL | Status: DC | PRN

## 2015-05-31 MED ORDER — ENOXAPARIN SODIUM 40 MG/0.4ML ~~LOC~~ SOLN
40.0000 mg | SUBCUTANEOUS | Status: DC
  Administered 2015-05-31: 40 mg via SUBCUTANEOUS
  Filled 2015-05-31: qty 0.4

## 2015-05-31 MED ORDER — FENTANYL CITRATE (PF) 100 MCG/2ML IJ SOLN
50.0000 ug | INTRAMUSCULAR | Status: DC | PRN
  Administered 2015-05-31 – 2015-06-02 (×6): 50 ug via INTRAVENOUS
  Filled 2015-05-31 (×6): qty 2

## 2015-05-31 MED ORDER — ALBUTEROL SULFATE (2.5 MG/3ML) 0.083% IN NEBU: 3.0000 mL | INHALATION_SOLUTION | Freq: Four times a day (QID) | RESPIRATORY_TRACT | Status: DC | PRN

## 2015-05-31 MED ORDER — ONDANSETRON HCL 4 MG/2ML IJ SOLN: 4.0000 mg | Freq: Four times a day (QID) | INTRAMUSCULAR | Status: DC | PRN

## 2015-05-31 MED ORDER — ONDANSETRON HCL 4 MG/2ML IJ SOLN
4.0000 mg | Freq: Once | INTRAMUSCULAR | Status: AC
  Administered 2015-05-31: 4 mg via INTRAVENOUS
  Filled 2015-05-31: qty 2

## 2015-05-31 MED ORDER — ONDANSETRON 4 MG PO TBDP
ORAL_TABLET | ORAL | Status: AC
  Filled 2015-05-31: qty 1

## 2015-05-31 MED ORDER — PANTOPRAZOLE SODIUM 40 MG IV SOLR
40.0000 mg | INTRAVENOUS | Status: DC
  Administered 2015-05-31 – 2015-06-02 (×3): 40 mg via INTRAVENOUS
  Filled 2015-05-31 (×3): qty 40

## 2015-05-31 MED ORDER — PRAMOXINE HCL 1 % RE FOAM
Freq: Three times a day (TID) | RECTAL | Status: DC | PRN
  Filled 2015-05-31: qty 15

## 2015-05-31 MED ORDER — FENTANYL CITRATE (PF) 100 MCG/2ML IJ SOLN
100.0000 ug | Freq: Once | INTRAMUSCULAR | Status: AC
  Administered 2015-05-31: 100 ug via INTRAVENOUS
  Filled 2015-05-31: qty 2

## 2015-05-31 MED ORDER — IOPAMIDOL (ISOVUE-300) INJECTION 61%
INTRAVENOUS | Status: AC
  Administered 2015-05-31: 100 mL
  Filled 2015-05-31: qty 100

## 2015-05-31 MED ORDER — ACETAMINOPHEN 325 MG PO TABS
650.0000 mg | ORAL_TABLET | Freq: Four times a day (QID) | ORAL | Status: DC | PRN
Start: 2015-05-31 — End: 2015-06-03
  Administered 2015-05-31: 650 mg via ORAL
  Filled 2015-05-31: qty 2

## 2015-05-31 MED ORDER — ONDANSETRON 4 MG PO TBDP
4.0000 mg | ORAL_TABLET | Freq: Once | ORAL | Status: AC | PRN
  Administered 2015-05-31: 4 mg via ORAL

## 2015-05-31 MED ORDER — KCL IN DEXTROSE-NACL 20-5-0.9 MEQ/L-%-% IV SOLN
INTRAVENOUS | Status: DC
Start: 2015-05-31 — End: 2015-06-03
  Administered 2015-05-31 – 2015-06-03 (×8): via INTRAVENOUS
  Filled 2015-05-31 (×14): qty 1000

## 2015-05-31 MED ORDER — PROCHLORPERAZINE EDISYLATE 5 MG/ML IJ SOLN
10.0000 mg | Freq: Four times a day (QID) | INTRAMUSCULAR | Status: DC | PRN
  Filled 2015-05-31: qty 2

## 2015-05-31 NOTE — Progress Notes (Signed)
Pt to room 19 from ED, pt in no distress, pt alert and oriented , pt oriented to room and unit, pt resting in bed with call bell in reach

## 2015-05-31 NOTE — ED Provider Notes (Signed)
CSN: [id number]     Arrival date & time 05/31/15  0445 History   First MD Initiated Contact with Patient 05/31/15 0645     Chief Complaint  Patient presents with  . Abdominal Pain    [name] [name] is a 45 y.o. male With a history of morbid obesity, cholecystectomy, and gastric sleeve surgery who presented to the emergency department complaining of left lower quadrant abdominal pain, chills, nausea, vomiting and diarrhea. Patient reports his symptoms began 3 days ago with diarrhea. He reports yesterday he had 2 episodes of vomiting. He complains of some nausea but no vomiting today. He reports one loose stool this morning. No hematemesis or hematochezia. He complains of moderate left lower quadrant abdominal pain. Patient also reports chills and fatigue. He denies fever. He is taking nothing for treatment of his symptoms today. The patient denies fevers, hematemesis, hematochezia, urinary symptoms, chest pain, or rashes.   Patient is a 45 y.o. male presenting with abdominal pain. The history is provided by the patient. No language interpreter was used.  Abdominal Pain Associated symptoms: diarrhea, nausea and vomiting   Associated symptoms: no chest pain, no chills, no cough, no dysuria, no fever, no hematuria and no sore throat     Past Medical History  Diagnosis Date  . GERD (gastroesophageal reflux disease)   . Hypertension   . Sleep apnea   . Obesity    Past Surgical History  Procedure Laterality Date  . Colonoscopy  10/16/2005    Polyps, external hemorrhoids  . Gastric restriction surgery  10/2010    sleeve- at [facility]  . Cholecystectomy N/A 02/14/2015    Procedure: LAPAROSCOPIC CHOLECYSTECTOMY WITH INTRAOPERATIVE CHOLANGIOGRAM;  Surgeon: Coralie Keens, MD;  Location: [facility] OR;  Service: General;  Laterality: N/A;   Family History  Problem Relation Age of Onset  . Diabetes Mellitus II Mother   . Breast cancer Mother   . Diabetes Mellitus II Father   . CAD Father   . Stroke  Father   . Cancer Father    Social History  Substance Use Topics  . Smoking status: Former Research scientist (life sciences)  . Smokeless tobacco: Former Systems developer  . Alcohol Use: Yes     Comment: Occasional    Review of Systems  Constitutional: Negative for fever and chills.  HENT: Negative for congestion and sore throat.   Eyes: Negative for visual disturbance.  Respiratory: Negative for cough.   Cardiovascular: Negative for chest pain.  Gastrointestinal: Positive for nausea, vomiting, abdominal pain and diarrhea. Negative for blood in stool and abdominal distention.  Genitourinary: Positive for decreased urine volume. Negative for dysuria, urgency, frequency, hematuria and difficulty urinating.  Musculoskeletal: Negative for back pain and neck pain.  Skin: Negative for rash.  Neurological: Negative for weakness and headaches.      Allergies  Bee venom; Codeine; Hornet venom; and Penicillins  Home Medications   Prior to Admission medications   Medication Sig Start Date End Date Taking? Authorizing Provider  albuterol (PROVENTIL HFA;VENTOLIN HFA) 108 (90 Base) MCG/ACT inhaler Inhale 2 puffs into the lungs every 6 (six) hours as needed for wheezing or shortness of breath. 05/11/15  Yes [name], NP  amLODipine (NORVASC) 5 MG tablet Take 1 tablet (5 mg total) by mouth daily. 10/06/14  Yes Lucille Passy, MD  omeprazole (PRILOSEC) 20 MG capsule Take 1 capsule (20 mg total) by mouth daily. 04/26/15  Yes [name], NP   BP 140/90 mmHg  Pulse 84  Temp(Src) 99.5 F (37.5  C) (Oral)  Resp 20  Ht 6\' 2"  (1.88 m)  Wt 193.595 kg  BMI 54.77 kg/m2  SpO2 95% Physical Exam  Constitutional: He appears well-developed and well-nourished. No distress.  Nontoxic appearing obese male.  HENT:  Head: Normocephalic and atraumatic.  Mouth/Throat: Oropharynx is clear and moist.  Eyes: Conjunctivae are normal. Pupils are equal, round, and reactive to light. Right eye exhibits no discharge. Left eye exhibits no  discharge.  Neck: Neck supple.  Cardiovascular: Normal rate, regular rhythm, normal heart sounds and intact distal pulses.  Exam reveals no gallop and no friction rub.   No murmur heard. Pulmonary/Chest: Effort normal and breath sounds normal. No respiratory distress. He has no wheezes. He has no rales.  Abdominal: Soft. Bowel sounds are normal. He exhibits no distension and no mass. There is tenderness. There is no rebound and no guarding.  Abdomen is soft. Bowel sounds are hypoactive. Patient has left lower quadrant tenderness to palpation. No peritoneal signs. No CVA or flank tenderness.  Musculoskeletal: He exhibits no edema or tenderness.  Lymphadenopathy:    He has no cervical adenopathy.  Neurological: He is alert. Coordination normal.  Skin: Skin is warm and dry. No rash noted. He is not diaphoretic. No erythema. No pallor.  Psychiatric: He has a normal mood and affect. His behavior is normal.  Nursing note and vitals reviewed.   ED Course  Procedures (including critical care time) Labs Review Labs Reviewed  COMPREHENSIVE METABOLIC PANEL - Abnormal; Notable for the following:    CO2 21 (*)    Glucose, Bld 121 (*)    All other components within normal limits  URINALYSIS, ROUTINE W REFLEX MICROSCOPIC (NOT AT [facility]) - Abnormal; Notable for the following:    Color, Urine AMBER (*)    APPearance CLOUDY (*)    Bilirubin Urine SMALL (*)    Ketones, ur 15 (*)    All other components within normal limits  LIPASE, BLOOD  CBC  BASIC METABOLIC PANEL    Imaging Review Ct Abdomen Pelvis W Contrast  05/31/2015  CLINICAL DATA:  Left-sided abdominal pain with nausea and vomiting for 1 day EXAM: CT ABDOMEN AND PELVIS WITH CONTRAST TECHNIQUE: Multidetector CT imaging of the abdomen and pelvis was performed using the standard protocol following bolus administration of intravenous contrast. CONTRAST:  14mL ISOVUE-300 IOPAMIDOL (ISOVUE-300) INJECTION 61% COMPARISON:  Jul 31, 2014. FINDINGS:  Lower chest:  Lung bases are clear. Hepatobiliary: There is hepatic steatosis. Beyond the hepatic steatosis, no focal liver lesions are evident. Gallbladder is absent. There is no biliary duct dilatation. Pancreas: No pancreatic mass or inflammatory focus appreciable. Spleen: No splenic lesions evident. Adrenals/Urinary Tract: Right adrenal appears unremarkable. There is a stable 1.1 x 1.1 cm left adrenal adenoma. There is a cyst arising from the lateral right kidney measuring 7.9 x 7.3 cm. There is a cyst arising from the medial mid right kidney measuring 2.6 x 2.8 cm. There is no hydronephrosis on either side. There is no renal or ureteral calculus on either side. Urinary bladder is midline with wall thickness within normal limits. Stomach/Bowel: The patient has had a gastric sleeve resection without wall thickening or fluid in this area. There is dilatation of multiple loops of small bowel without a discrete transition zone. Most of these bowel loops are fluid filled. There is no appreciable colonic dilatation. There is no appreciable bowel wall thickening. The mild mesenteric thickening in the mid abdomen noted on prior study persists without change. There is no free  air or portal venous air. There are multiple sigmoid diverticula without diverticulitis. Vascular/Lymphatic: There is no abdominal aortic aneurysm. No vascular lesions are evident. Multiple small lymph nodes are again noted throughout the mid abdominal mesenteric region without change. No new lymph node prominence is seen. There are several subcentimeter retroperitoneal lymph nodes which are also stable. By size criteria there is no frank adenopathy in the abdomen or pelvis. Reproductive: Prostate and seminal vesicles appear normal. There is no pelvic mass or pelvic fluid collection. Other: Appendix is not appreciated. There is no periappendiceal region inflammation. No abscess or ascites in the abdomen or pelvis. There is a small ventral hernia  containing only fat. Musculoskeletal: No blastic or lytic 1 bone lesions are evident. There is no intramuscular or abdominal wall lesion. IMPRESSION: Mild generalized small bowel dilatation. A well-defined transition zone is not seen. The appearance is more consistent with ileus than bowel obstruction. A low grade partial obstruction at the level of the distal most aspect of the ileum cannot be entirely excluded. There is persistent mesenteric thickening at the root of the mesentery which was present previously and has not progressed. Small lymph nodes in this area are stable. These changes may be secondary to chronic enteritis, although chronic sclerosing mesenteritis or lymphoproliferative disorder remain differential considerations. Status post gastric sleeve resection without complicating feature in this region. Hepatic steatosis.  Gallbladder absent. No bowel obstruction.  No ascites or abscess. There are sigmoid diverticula without diverticulitis. There is a small ventral hernia containing only fat. No renal or ureteral calculus. No hydronephrosis. There is a prominent cyst arising from the right kidney laterally measuring 7.9 x 7.3 cm. There is a small stable left adrenal adenoma. Electronically Signed   By: Lowella Grip III M.D.   On: 05/31/2015 10:41   I have personally reviewed and evaluated these images and lab results as part of my medical decision-making.   EKG Interpretation None      Filed Vitals:   05/31/15 1215 05/31/15 1300 05/31/15 1330 05/31/15 1532  BP: 152/95 147/85 134/85 140/90  Pulse: 85 86 86 84  Temp:      TempSrc:      Resp:    20  Height:    6\' 2"  (1.88 m)  Weight:    193.595 kg  SpO2: 95% 100% 98% 95%     MDM   Meds given in ED:  Medications  dextrose 5 % and 0.9 % NaCl with KCl 20 mEq/L infusion ( Intravenous New Bag/Given 05/31/15 1602)  ondansetron (ZOFRAN) injection 4 mg (not administered)  prochlorperazine (COMPAZINE) injection 10 mg (not  administered)  albuterol (PROVENTIL) (2.5 MG/3ML) 0.083% nebulizer solution 3 mL (not administered)  enoxaparin (LOVENOX) injection 40 mg (not administered)  acetaminophen (TYLENOL) tablet 650 mg (650 mg Oral Given 05/31/15 1158)    Or  acetaminophen (TYLENOL) suppository 650 mg ( Rectal See Alternative 05/31/15 1158)  fentaNYL (SUBLIMAZE) injection 50 mcg (50 mcg Intravenous Given 05/31/15 1530)  pramoxine (PROCTOFOAM) 1 % foam (not administered)  pantoprazole (PROTONIX) injection 40 mg (not administered)  ondansetron (ZOFRAN-ODT) disintegrating tablet 4 mg (4 mg Oral Given 05/31/15 0457)  sodium chloride 0.9 % bolus 1,000 mL (0 mLs Intravenous Stopped 05/31/15 1000)  sodium chloride 0.9 % bolus 1,000 mL (1,000 mLs Intravenous New Bag/Given 05/31/15 1000)  ondansetron (ZOFRAN) injection 4 mg (4 mg Intravenous Given 05/31/15 0759)  fentaNYL (SUBLIMAZE) injection 100 mcg (100 mcg Intravenous Given 05/31/15 0759)  iopamidol (ISOVUE-300) 61 % injection (100 mLs  Contrast Given 05/31/15 1009)    Current Discharge Medication List      Final diagnoses:  SBO (small bowel obstruction) ([place])   This  is a 45 y.o. male With a history of morbid obesity, cholecystectomy, and gastric sleeve surgery who presented to the emergency department complaining of left lower quadrant abdominal pain, chills, nausea, vomiting and diarrhea. Patient reports his symptoms began 3 days ago with diarrhea. He reports yesterday he had 2 episodes of vomiting. He complains of some nausea but no vomiting today. He reports one loose stool this morning. No hematemesis or hematochezia. He complains of moderate left lower quadrant abdominal pain.  On exam patient is afebrile nontoxic appearing. His abdomen is soft and his bowel sounds are hypoactive. He has left lower quadrant tenderness to palpation. Lipase is within normal limits. His CMP is remarkable only for a glucose of 121. CBC is within normal limits. We'll obtain CT abdomen and  pelvis with contrast to rule out obstruction versus diverticulitis. CT abdomen and pelvis with contrast indicates small bowel ileus with a low-grade partial obstruction of the level of the distal most aspect of the ilium. At reevaluation patient has had no vomiting since Zofran in the emergency department. Will admit for ileus. I consulted for admission with [facility] who accepted the patient for admission with step down bed.   This patient was discussed with Dr. Dayna Barker who agrees with assessment and plan.  Waynetta Pean, PA-C 05/31/15 [place], MD 06/01/15 [phone]

## 2015-05-31 NOTE — H&P (Signed)
[facility] History and Physical                                                                                    [name] [name], is a 45 y.o. male  MRN: [id number]   DOB - January 05, 1971  Admit Date - 05/31/2015  Outpatient Primary MD for the patient is [name] Norris, MD  Referring MD: [place] / ER  PMH: Past Medical History  Diagnosis Date  . GERD (gastroesophageal reflux disease)   . Hypertension   . Sleep apnea   . Obesity       PSH: Past Surgical History  Procedure Laterality Date  . Colonoscopy  10/16/2005    Polyps, external hemorrhoids  . Gastric restriction surgery  10/2010    sleeve- at [facility]  . Cholecystectomy N/A 02/14/2015    Procedure: LAPAROSCOPIC CHOLECYSTECTOMY WITH INTRAOPERATIVE CHOLANGIOGRAM;  Surgeon: Coralie Keens, MD;  Location: [place];  Service: General;  Laterality: N/A;     CC:  Chief Complaint  Patient presents with  . Abdominal Pain     HPI: 44 year old morbidly obese male with known history of sleep apnea, hypertension, GERD, hemorrhoids, ADHD, hypertension, prior abdominal surgeries including gastric sleeve and cholecystectomy, history of prior small bowel obstructions. Patient presented to the hospital with complaints of generalized abdominal pain and intractable emesis. Patient reports that beginning Saturday night Sunday morning he began having multiple loose stools that were painless and without blood. Last night he began having significant emesis after eating a [employer] sandwich. He states he felt quite bloated and like the food was getting stuck in his stomach. This is typical to his previous presentations for small bowel obstruction. Patient states he has had multiple small bowel obstruction episodes the last being 2 years ago. These typically resolve with bowel rest. He has not tolerated NG tube in the past.  ER Evaluation and treatment: Temperature 99.5 PO-BP 147/101-pulse 106-respirations 18-room air saturations 98%-weight 428  pounds-height 6 foot 1 inches-BMI 56.6 CT abdomen and pelvis with contrast: Mild generalized small bowel dilatation without a well-defined transition zone. Differential includes ileus versus bowel obstruction. Persistent chronic mesenteric thickening at the root of the mesentery which is stable. Patrick steatosis and diverticula without diverticulitis. Also has a prominent cyst arising from the right kidney and stable left adrenal adenoma Lab data: Na 136, K 4.0, BUN 9, Cr 1.12, glucose 121, W BC 7300, hgb 15.7, platelets 180,000 Zofran sublingual 4 mg 1 2 L normal saline IV bolus Zofran 4 mg IV 1 Fentanyl 100 g IV 1  Review of Systems   In addition to the HPI above,  No Fever-chills, myalgias or other constitutional symptoms No Headache, changes with Vision or hearing, new weakness, tingling, numbness in any extremity, No problems swallowing food or Liquids, indigestion/reflux No Chest pain, Cough or Shortness of Breath, palpitations, orthopnea or DOE No melena or hematochezia, no dark tarry stools No dysuria, hematuria or flank pain No new skin rashes, lesions, masses or bruises, No new joints pains-aches No recent weight gain or loss No polyuria, polydypsia or polyphagia,  *A full 10 point Review of Systems was done, except as stated above, all other Review of  Systems were negative.  Social History Social History  Substance Use Topics  . Smoking status: Former Research scientist (life sciences)  . Smokeless tobacco: Former Systems developer  . Alcohol Use: Yes     Comment: Occasional    Resides at: Private residence  Lives with: Alone  Ambulatory status: Without assistive devices   Family History Family History  Problem Relation Age of Onset  . Diabetes Mellitus II Mother   . Breast cancer Mother   . Diabetes Mellitus II Father   . CAD Father   . Stroke Father   . Cancer Father      Prior to Admission medications   Medication Sig Start Date End Date Taking? Authorizing Provider  albuterol  (PROVENTIL HFA;VENTOLIN HFA) 108 (90 Base) MCG/ACT inhaler Inhale 2 puffs into the lungs every 6 (six) hours as needed for wheezing or shortness of breath. 05/11/15   [name], NP  amLODipine (NORVASC) 5 MG tablet Take 1 tablet (5 mg total) by mouth daily. 10/06/14   Lucille Passy, MD  omeprazole (PRILOSEC) 20 MG capsule Take 1 capsule (20 mg total) by mouth daily. 04/26/15   [name], NP  oxyCODONE-acetaminophen (PERCOCET/ROXICET) 5-325 MG tablet Take 1-2 tablets by mouth every 6 (six) hours as needed for moderate pain or severe pain. Patient not taking: Reported on 05/11/2015 02/15/15   Nat Christen, PA-C    Allergies  Allergen Reactions  . Bee Venom Anaphylaxis  . Codeine Anaphylaxis    Tolerates dilaudid  . Hornet Venom Anaphylaxis  . Penicillins Hives    Has patient had a PCN reaction causing immediate rash, facial/tongue/throat swelling, SOB or lightheadedness with hypotensionYES Has patient had a PCN reaction causing severe rash involving mucus membranes or skin necrosis: NO Has patient had a PCN reaction that required hospitalization NO Has patient had a PCN reaction occurring within the last 10 years: NO If all of the above answers are "NO", then may proceed with Cephalosporin use.    Physical Exam  Vitals  Blood pressure 153/99, pulse 85, temperature 99.5 F (37.5 C), temperature source Oral, resp. rate 18, height 6\' 1"  (1.854 m), weight 428 lb (194.14 kg), SpO2 98 %.   General:  In no acute distress, appears healthy and well nourished  Psych:  Normal affect, Denies Suicidal or Homicidal ideations, Awake Alert, Oriented X 3. Speech and thought patterns are clear and appropriate, no apparent short term memory deficits  Neuro:   No focal neurological deficits, CN II through XII intact, Strength 5/5 all 4 extremities, Sensation intact all 4 extremities.  ENT:  Ears and Eyes appear Normal, Conjunctivae clear, PER. Moist oral mucosa without erythema or  exudates.  Neck:  Supple, No lymphadenopathy appreciated  Respiratory:  Symmetrical chest wall movement, Good air movement bilaterally, CTAB. Room Air  Cardiac:  RRR, No Murmurs, no LE edema noted, no JVD, No carotid bruits, peripheral pulses palpable at 2+  Abdomen:  Positive bowel sounds, Soft, mild diffuse tenderness, distended,  No masses appreciated, given abdominal girth unable to appreciate it has hepatosplenomegaly  Skin:  No Cyanosis, Normal Skin Turgor, No Skin Rash or Bruise.  Extremities: Symmetrical without obvious trauma or injury,  no effusions.  Data Review  CBC  Recent Labs Lab 05/31/15 0505  WBC 7.3  HGB 15.7  HCT 45.5  PLT 180  MCV 83.9  MCH 29.0  MCHC 34.5  RDW 12.5    Chemistries   Recent Labs Lab 05/31/15 0505  NA 136  K 4.0  CL  104  CO2 21*  GLUCOSE 121*  BUN 9  CREATININE 1.12  CALCIUM 9.2  AST 26  ALT 36  ALKPHOS 61  BILITOT 1.1    estimated creatinine clearance is 151.1 mL/min (by C-G formula based on Cr of 1.12).  No results for input(s): TSH, T4TOTAL, T3FREE, THYROIDAB in the last 72 hours.  Invalid input(s): FREET3  Coagulation profile No results for input(s): INR, PROTIME in the last 168 hours.  No results for input(s): DDIMER in the last 72 hours.  Cardiac Enzymes No results for input(s): CKMB, TROPONINI, MYOGLOBIN in the last 168 hours.  Invalid input(s): CK  Invalid input(s): POCBNP  Urinalysis    Component Value Date/Time   COLORURINE AMBER* 05/31/2015 0500   APPEARANCEUR CLOUDY* 05/31/2015 0500   LABSPEC 1.027 05/31/2015 0500   PHURINE 5.5 05/31/2015 0500   GLUCOSEU NEGATIVE 05/31/2015 0500   HGBUR NEGATIVE 05/31/2015 0500   BILIRUBINUR SMALL* 05/31/2015 0500   KETONESUR 15* 05/31/2015 0500   PROTEINUR NEGATIVE 05/31/2015 0500   UROBILINOGEN 1.0 05/18/2008 1235   NITRITE NEGATIVE 05/31/2015 0500   LEUKOCYTESUR NEGATIVE 05/31/2015 0500    Imaging results:   Ct Abdomen Pelvis W  Contrast  05/31/2015  CLINICAL DATA:  Left-sided abdominal pain with nausea and vomiting for 1 day EXAM: CT ABDOMEN AND PELVIS WITH CONTRAST TECHNIQUE: Multidetector CT imaging of the abdomen and pelvis was performed using the standard protocol following bolus administration of intravenous contrast. CONTRAST:  135mL ISOVUE-300 IOPAMIDOL (ISOVUE-300) INJECTION 61% COMPARISON:  Jul 31, 2014. FINDINGS: Lower chest:  Lung bases are clear. Hepatobiliary: There is hepatic steatosis. Beyond the hepatic steatosis, no focal liver lesions are evident. Gallbladder is absent. There is no biliary duct dilatation. Pancreas: No pancreatic mass or inflammatory focus appreciable. Spleen: No splenic lesions evident. Adrenals/Urinary Tract: Right adrenal appears unremarkable. There is a stable 1.1 x 1.1 cm left adrenal adenoma. There is a cyst arising from the lateral right kidney measuring 7.9 x 7.3 cm. There is a cyst arising from the medial mid right kidney measuring 2.6 x 2.8 cm. There is no hydronephrosis on either side. There is no renal or ureteral calculus on either side. Urinary bladder is midline with wall thickness within normal limits. Stomach/Bowel: The patient has had a gastric sleeve resection without wall thickening or fluid in this area. There is dilatation of multiple loops of small bowel without a discrete transition zone. Most of these bowel loops are fluid filled. There is no appreciable colonic dilatation. There is no appreciable bowel wall thickening. The mild mesenteric thickening in the mid abdomen noted on prior study persists without change. There is no free air or portal venous air. There are multiple sigmoid diverticula without diverticulitis. Vascular/Lymphatic: There is no abdominal aortic aneurysm. No vascular lesions are evident. Multiple small lymph nodes are again noted throughout the mid abdominal mesenteric region without change. No new lymph node prominence is seen. There are several  subcentimeter retroperitoneal lymph nodes which are also stable. By size criteria there is no frank adenopathy in the abdomen or pelvis. Reproductive: Prostate and seminal vesicles appear normal. There is no pelvic mass or pelvic fluid collection. Other: Appendix is not appreciated. There is no periappendiceal region inflammation. No abscess or ascites in the abdomen or pelvis. There is a small ventral hernia containing only fat. Musculoskeletal: No blastic or lytic 1 bone lesions are evident. There is no intramuscular or abdominal wall lesion. IMPRESSION: Mild generalized small bowel dilatation. A well-defined transition zone is not seen. The  appearance is more consistent with ileus than bowel obstruction. A low grade partial obstruction at the level of the distal most aspect of the ileum cannot be entirely excluded. There is persistent mesenteric thickening at the root of the mesentery which was present previously and has not progressed. Small lymph nodes in this area are stable. These changes may be secondary to chronic enteritis, although chronic sclerosing mesenteritis or lymphoproliferative disorder remain differential considerations. Status post gastric sleeve resection without complicating feature in this region. Hepatic steatosis.  Gallbladder absent. No bowel obstruction.  No ascites or abscess. There are sigmoid diverticula without diverticulitis. There is a small ventral hernia containing only fat. No renal or ureteral calculus. No hydronephrosis. There is a prominent cyst arising from the right kidney laterally measuring 7.9 x 7.3 cm. There is a small stable left adrenal adenoma. Electronically Signed   By: Lowella Grip III M.D.   On: 05/31/2015 10:41      Assessment & Plan  Active Problems:   SBO vs ileus -Patient with history of recurrent small bowel obstructions and presents again with dilated small bowel, abdominal pain and intractable nausea and vomiting without flatus -Admit to  floor/Obs-patient reports symptoms typically resolve within 48 hours -Nothing by mouth but allow small amounts of ice chips -IV fluids D5 normal saline with potassium at 125 per hour -prn Zofran and Compazine -Limited IV narcotics for pain with fentanyl 50 g every 4 hours prn -Encourage mobilization    Morbid obesity/ OSA -Provide nocturnal CPAP -BMI 56.6    HYPERTENSION, BENIGN ESSENTIAL -Holding home Norvasc -prn hydralazine    Hepatic steatosis -Likely related to ongoing obesity -LFTs are normal    ADHD  -Not on pharmacotherapy prior to admission    Hemorrhoids -Begin Proctofoam    GERD -Continue Protonix but convert to IV    DVT Prophylaxis: Lovenox  Family Communication:   No family at bedside  Code Status:  Full code  Condition:  Stable  Discharge disposition: Anticipate discharge back to previous home environment once medically stable  Time spent in minutes : 60      Corey Caulfield L. ANP on 05/31/2015 at 11:48 AM  You may contact me by going to [email or web address] - password [email or web address]  I am available from 7a-7p but please confirm I am on the schedule by going to Amion as above.   After 7p please contact night coverage person covering me after hours  [facility]

## 2015-05-31 NOTE — Progress Notes (Signed)
[name] [name] is a 45 y.o. male patient admitted from ED awake, alert - oriented  X 4 - no acute distress noted.  VSS - Blood pressure 140/90, pulse 84, temperature 99.5 F (37.5 C), temperature source Oral, resp. rate 20, height 6\' 2"  (1.88 m), weight 193.595 kg (426 lb 12.8 oz), SpO2 95 %.    IV in place, occlusive dsg intact without redness.  Orientation to room, and floor completed with information packet given to patient/family.  Patient declined safety video at this time.  Admission INP armband ID verified with patient/family, and in place.   SR up x 2, fall assessment complete, with patient and family able to verbalize understanding of risk associated with falls, and verbalized understanding to call nsg before up out of bed.  Call light within reach, patient able to voice, and demonstrate understanding.  Skin, clean-dry- intact without evidence of bruising, or skin tears.   No evidence of skin break down noted on exam.     Will cont to eval and treat per MD orders.  Janalyn Shy, RN 05/31/2015 3:49 PM

## 2015-05-31 NOTE — ED Notes (Signed)
Provider at the bedside.  

## 2015-05-31 NOTE — ED Notes (Signed)
Pt. reports generalized abdominal pain with emesis , diarrhea , chills and fatigue onset yesterday .

## 2015-05-31 NOTE — ED Provider Notes (Signed)
Medical screening examination/treatment/procedure(s) were conducted as a shared visit with non-physician practitioner(s) and myself.  I personally evaluated the patient during the encounter.  45 yo M w/ h/o obstruction here with diarrhea and now constipation. On my exam, symptoms improved but still present. Abdomen non tender, decreased bowel sounds. otherwise exam benign. Awaiting labs and CT and reevaluation for disposition.   Merrily Pew, MD 06/01/15 [phone]

## 2015-06-01 ENCOUNTER — Observation Stay (HOSPITAL_COMMUNITY): Payer: BC Managed Care – PPO

## 2015-06-01 DIAGNOSIS — K5660 Unspecified intestinal obstruction: Secondary | ICD-10-CM | POA: Diagnosis present

## 2015-06-01 DIAGNOSIS — D696 Thrombocytopenia, unspecified: Secondary | ICD-10-CM | POA: Diagnosis present

## 2015-06-01 DIAGNOSIS — K5669 Other intestinal obstruction: Secondary | ICD-10-CM | POA: Diagnosis not present

## 2015-06-01 DIAGNOSIS — R197 Diarrhea, unspecified: Secondary | ICD-10-CM | POA: Diagnosis not present

## 2015-06-01 DIAGNOSIS — K529 Noninfective gastroenteritis and colitis, unspecified: Secondary | ICD-10-CM | POA: Diagnosis present

## 2015-06-01 DIAGNOSIS — Y832 Surgical operation with anastomosis, bypass or graft as the cause of abnormal reaction of the patient, or of later complication, without mention of misadventure at the time of the procedure: Secondary | ICD-10-CM | POA: Diagnosis present

## 2015-06-01 DIAGNOSIS — K649 Unspecified hemorrhoids: Secondary | ICD-10-CM | POA: Diagnosis not present

## 2015-06-01 DIAGNOSIS — Z6841 Body Mass Index (BMI) 40.0 and over, adult: Secondary | ICD-10-CM | POA: Diagnosis not present

## 2015-06-01 DIAGNOSIS — F909 Attention-deficit hyperactivity disorder, unspecified type: Secondary | ICD-10-CM | POA: Diagnosis not present

## 2015-06-01 DIAGNOSIS — R109 Unspecified abdominal pain: Secondary | ICD-10-CM | POA: Diagnosis present

## 2015-06-01 DIAGNOSIS — K9589 Other complications of other bariatric procedure: Secondary | ICD-10-CM | POA: Diagnosis present

## 2015-06-01 DIAGNOSIS — I10 Essential (primary) hypertension: Secondary | ICD-10-CM | POA: Diagnosis not present

## 2015-06-01 DIAGNOSIS — K567 Ileus, unspecified: Secondary | ICD-10-CM | POA: Diagnosis present

## 2015-06-01 DIAGNOSIS — Z9119 Patient's noncompliance with other medical treatment and regimen: Secondary | ICD-10-CM | POA: Diagnosis not present

## 2015-06-01 DIAGNOSIS — Z9884 Bariatric surgery status: Secondary | ICD-10-CM | POA: Diagnosis not present

## 2015-06-01 DIAGNOSIS — G4733 Obstructive sleep apnea (adult) (pediatric): Secondary | ICD-10-CM | POA: Diagnosis present

## 2015-06-01 DIAGNOSIS — Z87891 Personal history of nicotine dependence: Secondary | ICD-10-CM | POA: Diagnosis not present

## 2015-06-01 DIAGNOSIS — Z91030 Bee allergy status: Secondary | ICD-10-CM | POA: Diagnosis not present

## 2015-06-01 DIAGNOSIS — K210 Gastro-esophageal reflux disease with esophagitis: Secondary | ICD-10-CM | POA: Diagnosis present

## 2015-06-01 DIAGNOSIS — D72819 Decreased white blood cell count, unspecified: Secondary | ICD-10-CM | POA: Diagnosis present

## 2015-06-01 DIAGNOSIS — D3502 Benign neoplasm of left adrenal gland: Secondary | ICD-10-CM | POA: Diagnosis present

## 2015-06-01 DIAGNOSIS — K654 Sclerosing mesenteritis: Secondary | ICD-10-CM | POA: Diagnosis not present

## 2015-06-01 DIAGNOSIS — K760 Fatty (change of) liver, not elsewhere classified: Secondary | ICD-10-CM

## 2015-06-01 DIAGNOSIS — Z885 Allergy status to narcotic agent status: Secondary | ICD-10-CM | POA: Diagnosis not present

## 2015-06-01 DIAGNOSIS — Z79899 Other long term (current) drug therapy: Secondary | ICD-10-CM | POA: Diagnosis not present

## 2015-06-01 DIAGNOSIS — R739 Hyperglycemia, unspecified: Secondary | ICD-10-CM | POA: Diagnosis present

## 2015-06-01 DIAGNOSIS — Z880 Allergy status to penicillin: Secondary | ICD-10-CM | POA: Diagnosis not present

## 2015-06-01 DIAGNOSIS — D124 Benign neoplasm of descending colon: Secondary | ICD-10-CM | POA: Diagnosis present

## 2015-06-01 DIAGNOSIS — K219 Gastro-esophageal reflux disease without esophagitis: Secondary | ICD-10-CM | POA: Diagnosis not present

## 2015-06-01 LAB — BASIC METABOLIC PANEL
ANION GAP: 9 (ref 5–15)
Anion gap: 8 (ref 5–15)
BUN: 7 mg/dL (ref 6–20)
BUN: 7 mg/dL (ref 6–20)
CALCIUM: 8.2 mg/dL — AB (ref 8.9–10.3)
CALCIUM: 8.4 mg/dL — AB (ref 8.9–10.3)
CHLORIDE: 107 mmol/L (ref 101–111)
CO2: 23 mmol/L (ref 22–32)
CO2: 22 mmol/L (ref 22–32)
CREATININE: 0.93 mg/dL (ref 0.61–1.24)
Chloride: 107 mmol/L (ref 101–111)
Creatinine, Ser: 0.95 mg/dL (ref 0.61–1.24)
GFR calc Af Amer: >60 mL/min (ref >60)
GFR calc non Af Amer: >60 mL/min (ref >60)
GFR calc non Af Amer: >60 mL/min (ref >60)
GLUCOSE: 99 mg/dL (ref 65–99)
Glucose, Bld: 101 mg/dL — ABNORMAL HIGH (ref 65–99)
Potassium: 3.7 mmol/L (ref 3.5–5.1)
Potassium: 3.8 mmol/L (ref 3.5–5.1)
SODIUM: 138 mmol/L (ref 135–145)
Sodium: 138 mmol/L (ref 135–145)

## 2015-06-01 LAB — CBC
HEMATOCRIT: 42.6 % (ref 39.0–52.0)
Hemoglobin: 14.4 g/dL (ref 13.0–17.0)
MCH: 28.6 pg (ref 26.0–34.0)
MCHC: 33.8 g/dL (ref 30.0–36.0)
MCV: 84.5 fL (ref 78.0–100.0)
PLATELETS: 139 10*3/uL — AB (ref 150–400)
RBC: 5.04 MIL/uL (ref 4.22–5.81)
RDW: 12.6 % (ref 11.5–15.5)
WBC: 3 10*3/uL — ABNORMAL LOW (ref 4.0–10.5)

## 2015-06-01 LAB — C DIFFICILE QUICK SCREEN W PCR REFLEX
C Diff antigen: NEGATIVE
C Diff interpretation: NEGATIVE
C Diff toxin: NEGATIVE

## 2015-06-01 LAB — MAGNESIUM: Magnesium: 2.2 mg/dL (ref 1.7–2.4)

## 2015-06-01 MED ORDER — ALBUTEROL SULFATE (2.5 MG/3ML) 0.083% IN NEBU
5.0000 mg | INHALATION_SOLUTION | Freq: Once | RESPIRATORY_TRACT | Status: AC
  Administered 2015-06-01: 5 mg via RESPIRATORY_TRACT
  Filled 2015-06-01: qty 6

## 2015-06-01 MED ORDER — ENOXAPARIN SODIUM 100 MG/ML ~~LOC~~ SOLN
0.5000 mg/kg | SUBCUTANEOUS | Status: DC
  Administered 2015-06-01 – 2015-06-02 (×2): 95 mg via SUBCUTANEOUS
  Filled 2015-06-01 (×2): qty 1

## 2015-06-01 NOTE — Consult Note (Signed)
Reason for Consult:SBO Referring Physician: Dr. Tyler Pita  [name] [name] is an 45 y.o. male.  HPI: Pt presented around 5 AM yesterday with LLQ abdominal pain, emesis, 3 days of diarrhea started 3/25, chills and fatigue.  No fever. He has had a couple episodes of emesis, the last was yesterday Am before he came to the ED around 4 AM.  He reports he is still having diarrhea. Work up show he is afebrile, and hypertensive.  Labs show no significant abnormalities.  CT scan with contrast  shows: Mild generalized small bowel dilatation. A well-defined transition zone is not seen. The appearance is more consistent with ileus than bowel obstruction. A low grade partial obstruction at the level of the distal most aspect of the ileum cannot be entirely excluded.  There is persistent mesenteric thickening at the root of the mesentery which was present previously and has not progressed. Small lymph nodes in this area are stable. These changes may be secondary to chronic enteritis, although chronic sclerosing mesenteritis or lymphoproliferative disorder remain differential considerations.  Film this AM show persistent SB distension, air noted in the colon, more consistent with ileus or partial SBO.   He reports both he and his daughter had a GI but in mid March.  Right now he is the only one sick at home.  He has had SBO in the past, about 2 years ago that resolved without any intervention.    Past Medical History  Diagnosis Date  . GERD (gastroesophageal reflux disease)   . Hypertension   . Obesity  Body mass index is 54.77 kg/(m^2).    Marland Kitchen Chronic bronchitis ([place])   . Sleep apnea     "suppose to wear mask but I don't" (05/31/2015)    Past Surgical History  Procedure Laterality Date  . Colonoscopy  10/16/2005    Polyps, external hemorrhoids  . 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    . Cholecystectomy N/A 02/14/2015    Procedure:  LAPAROSCOPIC CHOLECYSTECTOMY WITH INTRAOPERATIVE CHOLANGIOGRAM;  Surgeon: Coralie Keens, MD;  Location: [facility] OR;  Service: General;  Laterality: N/A;    Family History  Problem Relation Age of Onset  . Diabetes Mellitus II Mother   . Breast cancer Mother   . Diabetes Mellitus II Father   . CAD Father   . Stroke Father   . Cancer Father     Social History:  reports that he quit smoking about 7 years ago. His smoking use included Cigarettes. He has a 60 pack-year smoking history. His smokeless tobacco use includes Chew and Snuff. He reports that he drinks about 7.8 oz of alcohol per week. He reports that he does not use illicit drugs.  Allergies:  Allergies  Allergen Reactions  . Bee Venom Anaphylaxis  . Codeine Anaphylaxis    Tolerates dilaudid  . Hornet Venom Anaphylaxis  . Penicillins Hives    Has patient had a PCN reaction causing immediate rash, facial/tongue/throat swelling, SOB or lightheadedness with hypotensionYES Has patient had a PCN reaction causing severe rash involving mucus membranes or skin necrosis: NO Has patient had a PCN reaction that required hospitalization NO Has patient had a PCN reaction occurring within the last 10 years: NO If all of the above answers are "NO", then may proceed with Cephalosporin use.    Medications:  Prior to Admission:  Prescriptions prior to admission  Medication Sig Dispense  Refill Last Dose  . albuterol (PROVENTIL HFA;VENTOLIN HFA) 108 (90 Base) MCG/ACT inhaler Inhale 2 puffs into the lungs every 6 (six) hours as needed for wheezing or shortness of breath. 1 Inhaler 0 Past Month at Unknown time  . amLODipine (NORVASC) 5 MG tablet Take 1 tablet (5 mg total) by mouth daily. 90 tablet 3 05/30/2015 at Unknown time  . omeprazole (PRILOSEC) 20 MG capsule Take 1 capsule (20 mg total) by mouth daily. 30 capsule 0 05/30/2015 at Unknown time   Scheduled: . enoxaparin (LOVENOX) injection  40 mg Subcutaneous Q24H  . pantoprazole (PROTONIX) IV   40 mg Intravenous Q24H   Continuous: . dextrose 5 % and 0.9 % NaCl with KCl 20 mEq/L 125 mL/hr at 06/01/15 9417   EYC:[id number] **OR** acetaminophen, albuterol, fentaNYL (SUBLIMAZE) injection, ondansetron (ZOFRAN) IV, pramoxine, prochlorperazine Anti-infectives    None      Results for orders placed or performed during the hospital encounter of 05/31/15 (from the past 48 hour(s))  Urinalysis, Routine w reflex microscopic (not at [facility])     Status: Abnormal   Collection Time: 05/31/15  5:00 AM  Result Value Ref Range   Color, Urine AMBER (A) YELLOW    Comment: BIOCHEMICALS MAY BE AFFECTED BY COLOR   APPearance CLOUDY (A) CLEAR   Specific Gravity, Urine 1.027 1.005 - 1.030   pH 5.5 5.0 - 8.0   Glucose, UA NEGATIVE NEGATIVE mg/dL   Hgb urine dipstick NEGATIVE NEGATIVE   Bilirubin Urine SMALL (A) NEGATIVE   Ketones, ur 15 (A) NEGATIVE mg/dL   Protein, ur NEGATIVE NEGATIVE mg/dL   Nitrite NEGATIVE NEGATIVE   Leukocytes, UA NEGATIVE NEGATIVE    Comment: MICROSCOPIC NOT DONE ON URINES WITH NEGATIVE PROTEIN, BLOOD, LEUKOCYTES, NITRITE, OR GLUCOSE <1000 mg/dL.  Lipase, blood     Status: None   Collection Time: 05/31/15  5:05 AM  Result Value Ref Range   Lipase 21 11 - 51 U/L  Comprehensive metabolic panel     Status: Abnormal   Collection Time: 05/31/15  5:05 AM  Result Value Ref Range   Sodium 136 135 - 145 mmol/L   Potassium 4.0 3.5 - 5.1 mmol/L   Chloride 104 101 - 111 mmol/L   CO2 21 (L) 22 - 32 mmol/L   Glucose, Bld 121 (H) 65 - 99 mg/dL   BUN 9 6 - 20 mg/dL   Creatinine, Ser 1.12 0.61 - 1.24 mg/dL   Calcium 9.2 8.9 - 10.3 mg/dL   Total Protein 7.6 6.5 - 8.1 g/dL   Albumin 4.3 3.5 - 5.0 g/dL   AST 26 15 - 41 U/L   ALT 36 17 - 63 U/L   Alkaline Phosphatase 61 38 - 126 U/L   Total Bilirubin 1.1 0.3 - 1.2 mg/dL   GFR calc non Af Amer >60 >60 mL/min   GFR calc Af Amer >60 >60 mL/min    Comment: (NOTE) The eGFR has been calculated using the CKD EPI equation. This  calculation has not been validated in all clinical situations. eGFR's persistently <60 mL/min signify possible Chronic Kidney Disease.    Anion gap 11 5 - 15  CBC     Status: None   Collection Time: 05/31/15  5:05 AM  Result Value Ref Range   WBC 7.3 4.0 - 10.5 K/uL   RBC 5.42 4.22 - 5.81 MIL/uL   Hemoglobin 15.7 13.0 - 17.0 g/dL   HCT 45.5 39.0 - 52.0 %   MCV 83.9 78.0 - 100.0 fL  MCH 29.0 26.0 - 34.0 pg   MCHC 34.5 30.0 - 36.0 g/dL   RDW 12.5 11.5 - 15.5 %   Platelets 180 150 - 400 K/uL  C difficile quick scan w PCR reflex     Status: None   Collection Time: 05/31/15  8:43 PM  Result Value Ref Range   C Diff antigen NEGATIVE NEGATIVE   C Diff toxin NEGATIVE NEGATIVE   C Diff interpretation Negative for toxigenic C. difficile   Basic metabolic panel     Status: Abnormal   Collection Time: 06/01/15  5:57 AM  Result Value Ref Range   Sodium 138 135 - 145 mmol/L   Potassium 3.7 3.5 - 5.1 mmol/L   Chloride 107 101 - 111 mmol/L   CO2 23 22 - 32 mmol/L   Glucose, Bld 101 (H) 65 - 99 mg/dL   BUN 7 6 - 20 mg/dL   Creatinine, Ser 0.93 0.61 - 1.24 mg/dL   Calcium 8.2 (L) 8.9 - 10.3 mg/dL   GFR calc non Af Amer >60 >60 mL/min   GFR calc Af Amer >60 >60 mL/min    Comment: (NOTE) The eGFR has been calculated using the CKD EPI equation. This calculation has not been validated in all clinical situations. eGFR's persistently <60 mL/min signify possible Chronic Kidney Disease.    Anion gap 8 5 - 15  Basic metabolic panel     Status: Abnormal   Collection Time: 06/01/15  7:09 AM  Result Value Ref Range   Sodium 138 135 - 145 mmol/L   Potassium 3.8 3.5 - 5.1 mmol/L   Chloride 107 101 - 111 mmol/L   CO2 22 22 - 32 mmol/L   Glucose, Bld 99 65 - 99 mg/dL   BUN 7 6 - 20 mg/dL   Creatinine, Ser 0.95 0.61 - 1.24 mg/dL   Calcium 8.4 (L) 8.9 - 10.3 mg/dL   GFR calc non Af Amer >60 >60 mL/min   GFR calc Af Amer >60 >60 mL/min    Comment: (NOTE) The eGFR has been calculated using the  CKD EPI equation. This calculation has not been validated in all clinical situations. eGFR's persistently <60 mL/min signify possible Chronic Kidney Disease.    Anion gap 9 5 - 15  Magnesium     Status: None   Collection Time: 06/01/15  7:09 AM  Result Value Ref Range   Magnesium 2.2 1.7 - 2.4 mg/dL  CBC     Status: Abnormal   Collection Time: 06/01/15  7:09 AM  Result Value Ref Range   WBC 3.0 (L) 4.0 - 10.5 K/uL   RBC 5.04 4.22 - 5.81 MIL/uL   Hemoglobin 14.4 13.0 - 17.0 g/dL   HCT 42.6 39.0 - 52.0 %   MCV 84.5 78.0 - 100.0 fL   MCH 28.6 26.0 - 34.0 pg   MCHC 33.8 30.0 - 36.0 g/dL   RDW 12.6 11.5 - 15.5 %   Platelets 139 (L) 150 - 400 K/uL    Ct Abdomen Pelvis W Contrast  05/31/2015  CLINICAL DATA:  Left-sided abdominal pain with nausea and vomiting for 1 day EXAM: CT ABDOMEN AND PELVIS WITH CONTRAST TECHNIQUE: Multidetector CT imaging of the abdomen and pelvis was performed using the standard protocol following bolus administration of intravenous contrast. CONTRAST:  150m ISOVUE-300 IOPAMIDOL (ISOVUE-300) INJECTION 61% COMPARISON:  Jul 31, 2014. FINDINGS: Lower chest:  Lung bases are clear. Hepatobiliary: There is hepatic steatosis. Beyond the hepatic steatosis, no focal liver lesions are evident.  Gallbladder is absent. There is no biliary duct dilatation. Pancreas: No pancreatic mass or inflammatory focus appreciable. Spleen: No splenic lesions evident. Adrenals/Urinary Tract: Right adrenal appears unremarkable. There is a stable 1.1 x 1.1 cm left adrenal adenoma. There is a cyst arising from the lateral right kidney measuring 7.9 x 7.3 cm. There is a cyst arising from the medial mid right kidney measuring 2.6 x 2.8 cm. There is no hydronephrosis on either side. There is no renal or ureteral calculus on either side. Urinary bladder is midline with wall thickness within normal limits. Stomach/Bowel: The patient has had a gastric sleeve resection without wall thickening or fluid in this  area. There is dilatation of multiple loops of small bowel without a discrete transition zone. Most of these bowel loops are fluid filled. There is no appreciable colonic dilatation. There is no appreciable bowel wall thickening. The mild mesenteric thickening in the mid abdomen noted on prior study persists without change. There is no free air or portal venous air. There are multiple sigmoid diverticula without diverticulitis. Vascular/Lymphatic: There is no abdominal aortic aneurysm. No vascular lesions are evident. Multiple small lymph nodes are again noted throughout the mid abdominal mesenteric region without change. No new lymph node prominence is seen. There are several subcentimeter retroperitoneal lymph nodes which are also stable. By size criteria there is no frank adenopathy in the abdomen or pelvis. Reproductive: Prostate and seminal vesicles appear normal. There is no pelvic mass or pelvic fluid collection. Other: Appendix is not appreciated. There is no periappendiceal region inflammation. No abscess or ascites in the abdomen or pelvis. There is a small ventral hernia containing only fat. Musculoskeletal: No blastic or lytic 1 bone lesions are evident. There is no intramuscular or abdominal wall lesion. IMPRESSION: Mild generalized small bowel dilatation. A well-defined transition zone is not seen. The appearance is more consistent with ileus than bowel obstruction. A low grade partial obstruction at the level of the distal most aspect of the ileum cannot be entirely excluded. There is persistent mesenteric thickening at the root of the mesentery which was present previously and has not progressed. Small lymph nodes in this area are stable. These changes may be secondary to chronic enteritis, although chronic sclerosing mesenteritis or lymphoproliferative disorder remain differential considerations. Status post gastric sleeve resection without complicating feature in this region. Hepatic steatosis.   Gallbladder absent. No bowel obstruction.  No ascites or abscess. There are sigmoid diverticula without diverticulitis. There is a small ventral hernia containing only fat. No renal or ureteral calculus. No hydronephrosis. There is a prominent cyst arising from the right kidney laterally measuring 7.9 x 7.3 cm. There is a small stable left adrenal adenoma. Electronically Signed   By: Lowella Grip III M.D.   On: 05/31/2015 10:41   Dg Abd 2 Views  06/01/2015  CLINICAL DATA:  Abdominal pain. EXAM: ABDOMEN - 2 VIEW COMPARISON:  CT 05/31/2015 . FINDINGS: Persistent dilated loops of small bowel noted. Small bowel distention may have increased slightly from prior CT. Air is noted in colon. These findings are consistent with adynamic ileus versus partial small bowel obstruction. No free air. Degenerative changes lumbar spine. IMPRESSION: Persistent small bowel distention. Possible slight increase in small bowel distention from prior CT study of 05/31/2015. Air is noted in the colon. These findings are consistent with adynamic ileus versus partial small bowel obstruction. No free air. Continued follow-up exam suggested. Electronically Signed   By: Marcello Moores  Register   On: 06/01/2015 07:47  Review of Systems  Constitutional: Positive for chills. Negative for fever and weight loss.  HENT: Negative.   Eyes: Negative.   Respiratory: Negative for cough, hemoptysis, shortness of breath and wheezing.        Recent bronchitis  Cardiovascular: Positive for leg swelling.  Gastrointestinal: Positive for heartburn (on PPI), nausea, vomiting (4 AM yesteday before he came to the ED), abdominal pain (left side, more toward LLQ) and diarrhea (Ongoing diarrhea started 05/28/15.). Negative for constipation and blood in stool.  Genitourinary:       Decreased urine output  Musculoskeletal: Positive for back pain and joint pain.  Skin: Negative.   Neurological: Positive for weakness. Negative for dizziness, tingling,  tremors, sensory change, speech change, focal weakness and seizures.  Endo/Heme/Allergies: Negative.   Psychiatric/Behavioral: The patient is nervous/anxious.    Blood pressure 153/90, pulse 75, temperature 98.4 F (36.9 C), temperature source Oral, resp. rate 18, height 6' 2"  (1.88 m), weight 193.595 kg (426 lb 12.8 oz), SpO2 98 %. Physical Exam  Constitutional: He is oriented to person, place, and time. No distress.  Morbidly obese male in no acute distress, looks fairly comfortable.    HENT:  Head: Normocephalic and atraumatic.  Nose: Nose normal.  Eyes: Conjunctivae are normal. Right eye exhibits no discharge. Left eye exhibits no discharge. No scleral icterus.  Neck: Normal range of motion. Neck supple. No JVD present. No tracheal deviation present. No thyromegaly present.  Cardiovascular: Normal rate, regular rhythm, normal heart sounds and intact distal pulses.   No murmur heard. Respiratory: Effort normal and breath sounds normal. No respiratory distress. He has no wheezes. He has no rales. He exhibits no tenderness.  GI: Soft. He exhibits no distension and no mass. There is tenderness (some on the left side, more in lower quadrant ). There is no rebound and no guarding.  Cholecystectomy scar healed  Musculoskeletal: He exhibits no edema.  Lymphadenopathy:    He has no cervical adenopathy.  Neurological: He is alert and oriented to person, place, and time. No cranial nerve deficit.  Skin: Skin is warm and dry. No rash noted. He is not diaphoretic. No erythema. No pallor.  Psychiatric: He has a normal mood and affect. His behavior is normal. Judgment and thought content normal.    Assessment/Plan: SBO vs ileus  Probable enteritis  Hx of cholecystectomy 02/14/15, and gastric sleeve 10/2010. Morbid obesity BMI 56.6 Hypertension ADHD GERD Hemorrhoids    Plan:  I will discuss with Dr. Hulen Skains.  C diff is negative, but with ongoing diarrhea we would consider ileus after some type  of enteritis the most issue.  Agree with NPO, he refuses NG, fluid replacement.  Might consider enteric pathogens panel.  We will follow with you.    Janace Decker 06/01/2015, 10:47 AM

## 2015-06-01 NOTE — Progress Notes (Signed)
[facility]                                                                              Patient Demographics  [name] [name], is a 45 y.o. male, DOB - 10/17/1970, [id number]  Admit date - 05/31/2015   Admitting Physician Waldemar Dickens, MD  Outpatient Primary MD for the patient is [name], MD  LOS -   days    Chief Complaint  Patient presents with  . Abdominal Pain       Brief HPI  Patient is a 45 year old male with sleep apnea, hypertension, GERD, hemorrhoids, ADHD, hypertension, prior abdominal surgeries including gastric sleeve and cholecystectomy, history of prior small bowel obstructions. Patient presented to the hospital with complaints of generalized abdominal pain and intractable emesis. Patient reports that beginning Saturday night Sunday morning he began having multiple loose stools that were painless and without blood. Last night he began having significant emesis after eating a [employer] sandwich. He states he felt quite bloated and like the food was getting stuck in his stomach. This is typical to his previous presentations for small bowel obstruction. Patient states he has had multiple small bowel obstruction episodes the last being 2 years ago. These typically resolve with bowel rest. He has not tolerated NG tube in the past. CT abdomen showed ileus versus SBO   Assessment & Plan    SBO vs ileus -Patient with history of recurrent small bowel obstructions and admitted again with dilated small bowel, abdominal pain and intractable nausea and vomiting without flatus - CT abdomen showed mild generalized small bowel dilatation, ileus versus small bowel obstruction - X-ray abdomen showed persistent small bowel distention, patient refuses NG tube placement - Gen. surgery consulted, appreciate recommendations, continued gentle hydration with NPO status - C. difficile negative, GI pathogen panel ordered    Morbid obesity/ OSA -Provide nocturnal  CPAP -BMI 56.6   HYPERTENSION, BENIGN ESSENTIAL -Holding home Norvasc -prn hydralazine   Hepatic steatosis -Likely related to ongoing obesity -LFTs are normal   ADHD  -Not on pharmacotherapy prior to admission   Hemorrhoids -Begin Proctofoam   GERD -Continue Protonix but convert to IV  Code Status: full   Family Communication: Discussed in detail with the patient, all imaging results, lab results explained to the patient   Disposition Plan  Time Spent in minutes 25 minutes  Procedures  CT abdomen   Consults   General surgery   DVT Prophylaxis  Lovenox   Medications  Scheduled Meds: . enoxaparin (LOVENOX) injection  0.5 mg/kg Subcutaneous Q24H  . pantoprazole (PROTONIX) IV  40 mg Intravenous Q24H   Continuous Infusions: . dextrose 5 % and 0.9 % NaCl with KCl 20 mEq/L 125 mL/hr at 06/01/15 1306   PRN Meds:.acetaminophen **OR** acetaminophen, albuterol, fentaNYL (SUBLIMAZE) injection, ondansetron (ZOFRAN) IV, pramoxine, prochlorperazine   Antibiotics   Anti-infectives    None        Subjective:   [name] was seen and examined today.  Till having diarrhea, abdominal distention, abdominal pain. Patient denies dizziness, chest pain, shortness of breath,  new weakness, numbess, tingling. No acute events overnight.    Objective:  Filed Vitals:   05/31/15 1532 05/31/15 2242 06/01/15 0557 06/01/15 0809  BP: 140/90 140/92 153/90   Pulse: 84 73 75   Temp:  98.5 F (36.9 C) 98.4 F (36.9 C)   TempSrc:  Oral Oral   Resp: 20 18 18    Height: 6\' 2"  (1.88 m)     Weight: 193.595 kg (426 lb 12.8 oz)     SpO2: 95% 100% 98% 98%    Intake/Output Summary (Last 24 hours) at 06/01/15 1512 Last data filed at 06/01/15 1100  Gross per 24 hour  Intake 1745.83 ml  Output      0 ml  Net 1745.83 ml     Wt Readings from Last 3 Encounters:  05/31/15 193.595 kg (426 lb 12.8 oz)  05/11/15 198.222 kg (437 lb)  04/26/15 197.224 kg (434 lb 12.8 oz)      Exam  General: Alert and oriented x 3, NAD  HEENT:  PERRLA, EOMI, Anicteric Sclera, mucous membranes moist.   Neck: Supple, no JVD, no masses  CVS: S1 S2 auscultated, no rubs, murmurs or gallops. Regular rate and rhythm.  Respiratory: Clear to auscultation bilaterally, no wheezing, rales or rhonchi  Abdomen: Soft,  mild tenderness on the left lower quadrant, cholecystectomy scar, +distended, no rebound or guarding, hypoactive bowel sounds  Ext: no cyanosis clubbing or edema  Neuro: AAOx3, Cr N's II- XII. Strength 5/5 upper and lower extremities bilaterally  Skin: No rashes  Psych: Normal affect and demeanor, alert and oriented x3    Data Reviewed:  I have personally reviewed following labs and imaging studies  Micro Results Recent Results (from the past 240 hour(s))  C difficile quick scan w PCR reflex     Status: None   Collection Time: 05/31/15  8:43 PM  Result Value Ref Range Status   C Diff antigen NEGATIVE NEGATIVE Final   C Diff toxin NEGATIVE NEGATIVE Final   C Diff interpretation Negative for toxigenic C. difficile  Final    Radiology Reports Ct Abdomen Pelvis W Contrast  05/31/2015  CLINICAL DATA:  Left-sided abdominal pain with nausea and vomiting for 1 day EXAM: CT ABDOMEN AND PELVIS WITH CONTRAST TECHNIQUE: Multidetector CT imaging of the abdomen and pelvis was performed using the standard protocol following bolus administration of intravenous contrast. CONTRAST:  119mL ISOVUE-300 IOPAMIDOL (ISOVUE-300) INJECTION 61% COMPARISON:  Jul 31, 2014. FINDINGS: Lower chest:  Lung bases are clear. Hepatobiliary: There is hepatic steatosis. Beyond the hepatic steatosis, no focal liver lesions are evident. Gallbladder is absent. There is no biliary duct dilatation. Pancreas: No pancreatic mass or inflammatory focus appreciable. Spleen: No splenic lesions evident. Adrenals/Urinary Tract: Right adrenal appears unremarkable. There is a stable 1.1 x 1.1 cm left adrenal  adenoma. There is a cyst arising from the lateral right kidney measuring 7.9 x 7.3 cm. There is a cyst arising from the medial mid right kidney measuring 2.6 x 2.8 cm. There is no hydronephrosis on either side. There is no renal or ureteral calculus on either side. Urinary bladder is midline with wall thickness within normal limits. Stomach/Bowel: The patient has had a gastric sleeve resection without wall thickening or fluid in this area. There is dilatation of multiple loops of small bowel without a discrete transition zone. Most of these bowel loops are fluid filled. There is no appreciable colonic dilatation. There is no appreciable bowel wall thickening. The mild mesenteric thickening in the mid abdomen noted on prior study persists without change. There is no free air or  portal venous air. There are multiple sigmoid diverticula without diverticulitis. Vascular/Lymphatic: There is no abdominal aortic aneurysm. No vascular lesions are evident. Multiple small lymph nodes are again noted throughout the mid abdominal mesenteric region without change. No new lymph node prominence is seen. There are several subcentimeter retroperitoneal lymph nodes which are also stable. By size criteria there is no frank adenopathy in the abdomen or pelvis. Reproductive: Prostate and seminal vesicles appear normal. There is no pelvic mass or pelvic fluid collection. Other: Appendix is not appreciated. There is no periappendiceal region inflammation. No abscess or ascites in the abdomen or pelvis. There is a small ventral hernia containing only fat. Musculoskeletal: No blastic or lytic 1 bone lesions are evident. There is no intramuscular or abdominal wall lesion. IMPRESSION: Mild generalized small bowel dilatation. A well-defined transition zone is not seen. The appearance is more consistent with ileus than bowel obstruction. A low grade partial obstruction at the level of the distal most aspect of the ileum cannot be entirely  excluded. There is persistent mesenteric thickening at the root of the mesentery which was present previously and has not progressed. Small lymph nodes in this area are stable. These changes may be secondary to chronic enteritis, although chronic sclerosing mesenteritis or lymphoproliferative disorder remain differential considerations. Status post gastric sleeve resection without complicating feature in this region. Hepatic steatosis.  Gallbladder absent. No bowel obstruction.  No ascites or abscess. There are sigmoid diverticula without diverticulitis. There is a small ventral hernia containing only fat. No renal or ureteral calculus. No hydronephrosis. There is a prominent cyst arising from the right kidney laterally measuring 7.9 x 7.3 cm. There is a small stable left adrenal adenoma. Electronically Signed   By: Lowella Grip III M.D.   On: 05/31/2015 10:41   Dg Abd 2 Views  06/01/2015  CLINICAL DATA:  Abdominal pain. EXAM: ABDOMEN - 2 VIEW COMPARISON:  CT 05/31/2015 . FINDINGS: Persistent dilated loops of small bowel noted. Small bowel distention may have increased slightly from prior CT. Air is noted in colon. These findings are consistent with adynamic ileus versus partial small bowel obstruction. No free air. Degenerative changes lumbar spine. IMPRESSION: Persistent small bowel distention. Possible slight increase in small bowel distention from prior CT study of 05/31/2015. Air is noted in the colon. These findings are consistent with adynamic ileus versus partial small bowel obstruction. No free air. Continued follow-up exam suggested. Electronically Signed   By: Marcello Moores  Register   On: 06/01/2015 07:47    CBC  Recent Labs Lab 05/31/15 0505 06/01/15 0709  WBC 7.3 3.0*  HGB 15.7 14.4  HCT 45.5 42.6  PLT 180 139*  MCV 83.9 84.5  MCH 29.0 28.6  MCHC 34.5 33.8  RDW 12.5 12.6    Chemistries   Recent Labs Lab 05/31/15 0505 06/01/15 0557 06/01/15 0709  NA 136 138 138  K 4.0 3.7 3.8   CL 104 107 107  CO2 21* 23 22  GLUCOSE 121* 101* 99  BUN 9 7 7   CREATININE 1.12 0.93 0.95  CALCIUM 9.2 8.2* 8.4*  MG  --   --  2.2  AST 26  --   --   ALT 36  --   --   ALKPHOS 61  --   --   BILITOT 1.1  --   --    ------------------------------------------------------------------------------------------------------------------ estimated creatinine clearance is 179.8 mL/min (by C-G formula based on Cr of 0.95). ------------------------------------------------------------------------------------------------------------------ No results for input(s): HGBA1C in the last 72 hours. ------------------------------------------------------------------------------------------------------------------  No results for input(s): CHOL, HDL, LDLCALC, TRIG, CHOLHDL, LDLDIRECT in the last 72 hours. ------------------------------------------------------------------------------------------------------------------ No results for input(s): TSH, T4TOTAL, T3FREE, THYROIDAB in the last 72 hours.  Invalid input(s): FREET3 ------------------------------------------------------------------------------------------------------------------ No results for input(s): VITAMINB12, FOLATE, FERRITIN, TIBC, IRON, RETICCTPCT in the last 72 hours.  Coagulation profile No results for input(s): INR, PROTIME in the last 168 hours.  No results for input(s): DDIMER in the last 72 hours.  Cardiac Enzymes No results for input(s): CKMB, TROPONINI, MYOGLOBIN in the last 168 hours.  Invalid input(s): CK ------------------------------------------------------------------------------------------------------------------ Invalid input(s): POCBNP  No results for input(s): GLUCAP in the last 72 hours.   Taylan Marez M.D. [facility] 06/01/2015, 3:12 PM  Pager: [phone] Between 7am to 7pm - call Pager - [phone]  After 7pm go to [email or web address] - password [email or web address]  Call night coverage person covering after 7pm

## 2015-06-01 NOTE — Progress Notes (Signed)
Patient awoke this morning with c/o SOB with audible wheezing heard. Says it might be his bronchitis. Will notify MD.

## 2015-06-01 NOTE — Progress Notes (Signed)
RT attempted to place patient on CPAP, he said he does not use CPAP. Patient is advised to call back if he changes his mind.

## 2015-06-01 NOTE — Care Management Note (Signed)
Case Management Note  Patient Details  Name: [name] [name] MRN: [id number] Date of Birth: 1970-03-28  Subjective/Objective:                 Patient admitted with SBO, Hx gastric sleeve. NPO w/ IVF at this time. Independent from home with wife and family.   Action/Plan:  CM will continue to follow Expected Discharge Date:                  Expected Discharge Plan:  Home/Self Care  In-House Referral:     Discharge planning Services  CM Consult  Post Acute Care Choice:  NA Choice offered to:     DME Arranged:    DME Agency:     HH Arranged:    HH Agency:     Status of Service:  In process, will continue to follow  Medicare Important Message Given:    Date Medicare IM Given:    Medicare IM give by:    Date Additional Medicare IM Given:    Additional Medicare Important Message give by:     If discussed at [place] of Stay Meetings, dates discussed:    Additional Comments:  Carles Collet, RN 06/01/2015, 4:08 PM

## 2015-06-02 ENCOUNTER — Encounter (HOSPITAL_COMMUNITY): Payer: Self-pay | Admitting: Physician Assistant

## 2015-06-02 DIAGNOSIS — R197 Diarrhea, unspecified: Secondary | ICD-10-CM

## 2015-06-02 DIAGNOSIS — K5669 Other intestinal obstruction: Secondary | ICD-10-CM

## 2015-06-02 DIAGNOSIS — K219 Gastro-esophageal reflux disease without esophagitis: Secondary | ICD-10-CM

## 2015-06-02 DIAGNOSIS — K654 Sclerosing mesenteritis: Secondary | ICD-10-CM

## 2015-06-02 DIAGNOSIS — G4733 Obstructive sleep apnea (adult) (pediatric): Secondary | ICD-10-CM

## 2015-06-02 LAB — GASTROINTESTINAL PANEL BY PCR, STOOL (REPLACES STOOL CULTURE)
ASTROVIRUS: NOT DETECTED
Adenovirus F40/41: NOT DETECTED
CRYPTOSPORIDIUM: NOT DETECTED
CYCLOSPORA CAYETANENSIS: NOT DETECTED
Campylobacter species: NOT DETECTED
E. COLI O157: NOT DETECTED
ENTAMOEBA HISTOLYTICA: NOT DETECTED
ENTEROTOXIGENIC E COLI (ETEC): NOT DETECTED
Enteroaggregative E coli (EAEC): NOT DETECTED
Enteropathogenic E coli (EPEC): NOT DETECTED
Giardia lamblia: NOT DETECTED
Norovirus GI/GII: NOT DETECTED
Plesimonas shigelloides: NOT DETECTED
Rotavirus A: NOT DETECTED
SALMONELLA SPECIES: NOT DETECTED
SAPOVIRUS (I, II, IV, AND V): NOT DETECTED
Shiga like toxin producing E coli (STEC): NOT DETECTED
Shigella/Enteroinvasive E coli (EIEC): NOT DETECTED
VIBRIO CHOLERAE: NOT DETECTED
VIBRIO SPECIES: NOT DETECTED
YERSINIA ENTEROCOLITICA: NOT DETECTED

## 2015-06-02 LAB — CBC
HEMATOCRIT: 39.8 % (ref 39.0–52.0)
HEMOGLOBIN: 13.9 g/dL (ref 13.0–17.0)
MCH: 29.5 pg (ref 26.0–34.0)
MCHC: 34.9 g/dL (ref 30.0–36.0)
MCV: 84.5 fL (ref 78.0–100.0)
Platelets: 126 10*3/uL — ABNORMAL LOW (ref 150–400)
RBC: 4.71 MIL/uL (ref 4.22–5.81)
RDW: 12.5 % (ref 11.5–15.5)
WBC: 2.6 10*3/uL — ABNORMAL LOW (ref 4.0–10.5)

## 2015-06-02 LAB — BASIC METABOLIC PANEL
Anion gap: 6 (ref 5–15)
BUN: 6 mg/dL (ref 6–20)
CHLORIDE: 107 mmol/L (ref 101–111)
CO2: 28 mmol/L (ref 22–32)
Calcium: 8.4 mg/dL — ABNORMAL LOW (ref 8.9–10.3)
Creatinine, Ser: 0.99 mg/dL (ref 0.61–1.24)
GFR calc Af Amer: >60 mL/min (ref >60)
GFR calc non Af Amer: >60 mL/min (ref >60)
GLUCOSE: 103 mg/dL — AB (ref 65–99)
POTASSIUM: 4.1 mmol/L (ref 3.5–5.1)
Sodium: 141 mmol/L (ref 135–145)

## 2015-06-02 LAB — SEDIMENTATION RATE: Sed Rate: 24 mm/hr — ABNORMAL HIGH (ref 0–16)

## 2015-06-02 LAB — C-REACTIVE PROTEIN: CRP: 1 mg/dL — ABNORMAL HIGH (ref <1.0)

## 2015-06-02 MED ORDER — CHOLESTYRAMINE LIGHT 4 G PO PACK
4.0000 g | PACK | Freq: Three times a day (TID) | ORAL | Status: DC
  Administered 2015-06-02: 4 g via ORAL
  Filled 2015-06-02 (×3): qty 1

## 2015-06-02 NOTE — Progress Notes (Signed)
Patient refused CPAP.

## 2015-06-02 NOTE — Consult Note (Signed)
[facility] Consult: 11:56 AM 06/02/2015  LOS: 1 day    Referring Provider: Dr Tana Coast  Primary Care Physician:  [name], MD Primary Gastroenterologist:   Dr Ardis Hughs    Reason for Consultation:  Diarrhea.    HPI: [name] [name] is a 45 y.o. male.  Supra obese ( BMI 56, 428#).  OSA, does not use the CPAP.   S/p 02/2015 lap chole.  Fatty liver.   S/p gastric sleeve bariatric surgery at [facility] [facility] 10/2010, Dr Mickie Bail.  Post op anemia 10/2010 and pt reported ":internal bleeding" but no EGD or repeat surgeries required.  06/2010 esophagram in [place]: normal, no dysmotility.  02/2006 Colonoscopy vs Flex sig. Pathology on descending colon polyp: tubular adenoma.  Unable to locate colonoscopy report.  Multiple admissions/ED visits  [facility] 12/2010 with high grade SBO per CT. [facility] hospital 2013, ED in [place] 04/2014, [place] 07/2014,  inpt [place] 07/2014) with overt SBO or PSBO/ileus.  07/2014 diagnosed with enteritis. sxs consist of diarrhea, N/V and abdominal pain.  stoll studies 07/2014 negative for C diff and pathogens.  He says the events are often precipitated after he eats bread (was warned to avoid bread by bypass surgeon).  CT 07/2014: "stable haziness of the root of small bowel mesentery with subjectively prominent lymph nodes. As previously reported, this may be seen with enteritis although lymphoproliferative processes or rarely leukemia/ lymphoma may present similarly" .  Fatty liver noted.  On  3/25 onset of loose stools, multiple/watery/non-foul smelling/non-bloody.  Persisted for next 2 days.  3/26 onset abdominal pain, myalgias, sweats,  first at the [facility] but moved into [facility]; flet like po intake was delayed in his stomach.  Not severe pain.  N/V: not bloody, dark or feculent on  early AM 3/26 after which the body aches and abdominal pain were better.  Came to ED.  LFTs (including Albumin), Lipase normal. Electrolytes, renal function ok.   Not anemic.  WBCs and platelets initially normal but have dropped to 2.6 and 126 respectively.  C diff is negative, pathogen panel pending. 05/31/15 CT abd/pelvis: dilatation of multiple loops of small bowel without a discrete transition zone:  More c/w ileus than SBO. Low grade partial obstruction at the level of the distal most aspect of the ileum cannot be entirely excluded.  Persistent mesenteric thickening at the root of the mesentery which was present previously and has not progressed. Small lymph nodes in this area are stable. These changes may be secondary to chronic enteritis, although chronic sclerosing mesenteritis or lymphoproliferative disorder remain differential considerations.  No post gastric sleeve complications.  + fatty liver.  Small, fat-containing ventral hernia. Right kidney cyst.  .06/01/15 2 view abdomen: persistent PSBO vs ileus      Surgery consulted 3/29:  Dr Hulen Skains is thinking this may be enteritis.  Marland Kitchen   He was NPO and stool volume slowed though still had 7 to 10 loose/small stools yesterday.  This AM, after starting clear trays, stool volume is increased  Pt notes that he has ignored colonoscopy recall  letters, too busy.   Re: weight loss: 404 pre sleeve, lost to 298 within 6 months, then steady weight gain back to 420s.  He no longer has any difficulty accommodating normal to large meals and admits to a "bad diet"  Pt drinks up to 8 beers per day or 6 to 7 Bourbon drinks per day on irregular basis.  Sometimes nothing for 2 weeks, then drinking for 3 to 4 days in a row.  Denies liver, pancreatic issues.   Never transfused with blood, no bleeding issues.  Was advised to take po B12 and po iron after 2012 surgery but has not been compliant and has not had anemia noted on recent labs.  No ASA or NSAIDs.       Past  Medical History  Diagnosis Date  . GERD (gastroesophageal reflux disease)   . Hypertension   . Obesity   . Chronic bronchitis ([place])   . Sleep apnea     "suppose to wear mask but I don't" (05/31/2015)    Past Surgical History  Procedure Laterality Date  . Colonoscopy  10/16/2005    Polyps, external hemorrhoids  . Laparoscopic gastric sleeve resection  10/2010     at [facility]  . Cholecystectomy N/A 02/14/2015    Procedure: LAPAROSCOPIC CHOLECYSTECTOMY WITH INTRAOPERATIVE CHOLANGIOGRAM;  Surgeon: Coralie Keens, MD;  Location: [facility];  Service: General;  Laterality: N/A;    Prior to Admission medications   Medication Sig Start Date End Date Taking? Authorizing Provider  albuterol (PROVENTIL HFA;VENTOLIN HFA) 108 (90 Base) MCG/ACT inhaler Inhale 2 puffs into the lungs every 6 (six) hours as needed for wheezing or shortness of breath. 05/11/15  Yes [name], NP  amLODipine (NORVASC) 5 MG tablet Take 1 tablet (5 mg total) by mouth daily. 10/06/14  Yes Lucille Passy, MD  omeprazole (PRILOSEC) 20 MG capsule Take 1 capsule (20 mg total) by mouth daily. 04/26/15  Yes [name], NP    Scheduled Meds: . enoxaparin (LOVENOX) injection  0.5 mg/kg Subcutaneous Q24H  . pantoprazole (PROTONIX) IV  40 mg Intravenous Q24H   Infusions: . dextrose 5 % and 0.9 % NaCl with KCl 20 mEq/L 125 mL/hr at 06/02/15 0623   PRN Meds: acetaminophen **OR** acetaminophen, albuterol, fentaNYL (SUBLIMAZE) injection, ondansetron (ZOFRAN) IV, pramoxine, prochlorperazine   Allergies as of 05/31/2015 - Review Complete 05/31/2015  Allergen Reaction Noted  . Bee venom Anaphylaxis 12/06/2013  . Codeine Anaphylaxis 04/10/2006  . Hornet venom Anaphylaxis 07/31/2014  . Penicillins Hives 04/10/2006    Family History  Problem Relation Age of Onset  . Diabetes Mellitus II Mother   . Breast cancer Mother   . Diabetes Mellitus II Father   . CAD Father   . Stroke Father   . Cancer Father     Social History     Social History  . Marital Status: Married    Spouse Name: N/A  . Number of Children: 1  . Years of Education: N/A   Occupational History  .     Social History Main Topics  . Smoking status: Former Smoker -- 3.00 packs/day for 20 years    Types: Cigarettes    Quit date: 01/13/2008  . Smokeless tobacco: Current User    Types: Chew, Snuff  . Alcohol Use: 7.8 oz/week    13 Shots of liquor per week     Comment: 05/31/2015 "1 pint of liquor/wk"  . Drug Use: No  . Sexual Activity: Yes   Other Topics Concern  .  Not on file   Social History Narrative    REVIEW OF SYSTEMS: Constitutional:  Per HPI ENT:  No nose bleeds Pulm:  No SOB, + cough CV:  No palpitations, no LE edema.  GU:  No hematuria, no frequency GI:  Per HPI.  No dysphagia Heme:  No unusual bleeding or bruising   Transfusions:  none Neuro:  No headaches, no peripheral tingling or numbness Derm:  No itching, no rash or sores.  Endocrine:  No sweats or chills.  No polyuria or dysuria Immunization:  Has current flu shot Travel:  None beyond local counties in last few months.    PHYSICAL EXAM: Vital signs in last 24 hours: Filed Vitals:   06/01/15 2229 06/02/15 0451  BP: 155/101 149/97  Pulse: 78 69  Temp: 98.7 F (37.1 C) 97.7 F (36.5 C)  Resp: 20 20   Wt Readings from Last 3 Encounters:  05/31/15 193.595 kg (426 lb 12.8 oz)  05/11/15 198.222 kg (437 lb)  04/26/15 197.224 kg (434 lb 12.8 oz)    General: Hugely obese WM.  Comfortable.  Not ill looking Head: no asymmetry or swelling  Eyes:  No icterus or pallor  Ears:  Not HOH  Nose:  No congestion or discharge Mouth:  Clear, moist.  Good teeth Neck:  No mass, no TMG, no JVD Lungs:  Clear but distant BS.  No cough.  Voice is not hoarse Heart: RRR.  No MRG.  S1/S2 audible.  Distant sounds Abdomen:  Large, small scope scars are all well healed.   Rectal: deferred   Musc/Skeltl: no joint erythema or swelling Extremities:  No-pitting pedal/ankle  edema  Neurologic:  Oriented x 3.  Full limb strength.  No tremor Skin:  No telangiectasias, no rash, no sores Tattoos:  On right upper arm, professional.    Psych:  Cooperative, calm.    Intake/Output from previous day: 03/29 0701 - 03/30 0700 In: 3054.2 [I.V.:3054.2] Out: 250 [Urine:250] Intake/Output this shift:    LAB RESULTS:  Recent Labs  05/31/15 0505 06/01/15 0709 06/02/15 0641  WBC 7.3 3.0* 2.6*  HGB 15.7 14.4 13.9  HCT 45.5 42.6 39.8  PLT 180 139* 126*   BMET Lab Results  Component Value Date   NA 141 06/02/2015   NA 138 06/01/2015   NA 138 06/01/2015   K 4.1 06/02/2015   K 3.8 06/01/2015   K 3.7 06/01/2015   CL 107 06/02/2015   CL 107 06/01/2015   CL 107 06/01/2015   CO2 28 06/02/2015   CO2 22 06/01/2015   CO2 23 06/01/2015   GLUCOSE 103* 06/02/2015   GLUCOSE 99 06/01/2015   GLUCOSE 101* 06/01/2015   BUN 6 06/02/2015   BUN 7 06/01/2015   BUN 7 06/01/2015   CREATININE 0.99 06/02/2015   CREATININE 0.95 06/01/2015   CREATININE 0.93 06/01/2015   CALCIUM 8.4* 06/02/2015   CALCIUM 8.4* 06/01/2015   CALCIUM 8.2* 06/01/2015   LFT  Recent Labs  05/31/15 0505  PROT 7.6  ALBUMIN 4.3  AST 26  ALT 36  ALKPHOS 61  BILITOT 1.1   PT/INR No results found for: INR, PROTIME Hepatitis Panel No results for input(s): HEPBSAG, HCVAB, HEPAIGM, HEPBIGM in the last 72 hours. C-Diff No components found for: CDIFF Lipase     Component Value Date/Time   LIPASE 21 05/31/2015 0505    Drugs of Abuse  No results found for: LABOPIA, COCAINSCRNUR, LABBENZ, AMPHETMU, THCU, LABBARB   RADIOLOGY STUDIES: Dg Abd 2 Views  06/01/2015  CLINICAL DATA:  Abdominal pain. EXAM: ABDOMEN - 2 VIEW COMPARISON:  CT 05/31/2015 . FINDINGS: Persistent dilated loops of small bowel noted. Small bowel distention may have increased slightly from prior CT. Air is noted in colon. These findings are consistent with adynamic ileus versus partial small bowel obstruction. No free air.  Degenerative changes lumbar spine. IMPRESSION: Persistent small bowel distention. Possible slight increase in small bowel distention from prior CT study of 05/31/2015. Air is noted in the colon. These findings are consistent with adynamic ileus versus partial small bowel obstruction. No free air. Continued follow-up exam suggested. Electronically Signed   By: Marcello Moores  Register   On: 06/01/2015 07:47    ENDOSCOPIC STUDIES: 2007 Colonoscopy  IMPRESSION:   *  PSBO/ileus.  One of several since his 2012 gastric sleeve bariatric surgery. Doubt infectious enteritis, ? Inflammatory enteritis. Diarrhea persists, but also doubt this is infectious.? If current and prior episodes due to the SB mesenteric lymphadenopathy or are these reactive to the enteritis?  Surgery informs me that internal hernias are associated with Roux-en Y bypass but not gastric sleeve procedure.   *  Supra morbid Obesity.  Gastric sleeve failure.   *  Fatty liver  *  Thrombocytopenia, leukopenia.    *  Hx tubular adenoma of colon 2007.  4 years overdue for surveillance colonoscopy.    PLAN:     *  Per Dr Silverio Decamp.  Leave pt on clears for now.     Azucena Freed  06/02/2015, 11:56 AM Pager: [phone]      Attending physician's note   I have taken a history, examined the patient and reviewed the chart. I agree with the Advanced Practitioner's note, impression and recommendations. Recurrent episodes of ileus predominantly of only small bowel, with no evidence of obstruction, patient reports having 1-2 episodes a year where he develops diarrhea associated with L side abdominal pain. These episodes started after gastric sleeve surgery and thinks he had an episode right after cholecystectomy. At baseline he has no abdominal pain and is constipated. No fever or chills. His diarrhea slowed down while he was NPO and he started having liquid bowel movements once he started clear liquid diet. 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 Infectious work up negative, no bowel wall thickening.  check fecal lactoferrin but inflammatory bowel disease seems less likely and elastase to r/o pancreatic insufficiency. S/p cholecystectomy ?bile salt diarrhea. Can do a trial of cholestyramine.  He is past due for surveillance colonoscopy, will schedule for it as outpatient. He is eager to be discharged. Follow up in office visit.   Damaris Hippo, MD [phone] Mon-Fri 8a-5p [phone] after 5p, weekends, holidays

## 2015-06-02 NOTE — Progress Notes (Signed)
[facility] Progress Note     Subjective: Pt has had near resolution of his pain.  No N/V, continues to have diarrhea.  Pain is gone.  GI panel pending.  C.Diff neg.  He's starving and wants to eat.  Ambulating well.  Urinating well.    Objective: Vital signs in last 24 hours: Temp:  [97.7 F (36.5 C)-98.7 F (37.1 C)] 97.7 F (36.5 C) (03/30 0451) Pulse Rate:  [69-78] 69 (03/30 0451) Resp:  [20] 20 (03/30 0451) BP: (131-155)/(81-101) 149/97 mmHg (03/30 0451) SpO2:  [97 %-100 %] 97 % (03/30 0451) Last BM Date: 06/01/15  Intake/Output from previous day: 03/29 0701 - 03/30 0700 In: 3054.2 [I.V.:3054.2] Out: 250 [Urine:250] Intake/Output this shift:    PE: Gen:  Alert, NAD, pleasant Card:  RRR, no M/G/R heard Pulm:  CTA, no W/R/R Abd: Extreme obesity, soft, mild distension, +BS, no HSM, abdominal scars noted and well healed from lap chole   Lab Results:   Recent Labs  06/01/15 0709 06/02/15 0641  WBC 3.0* 2.6*  HGB 14.4 13.9  HCT 42.6 39.8  PLT 139* 126*   BMET  Recent Labs  06/01/15 0709 06/02/15 0641  NA 138 141  K 3.8 4.1  CL 107 107  CO2 22 28  GLUCOSE 99 103*  BUN 7 6  CREATININE 0.95 0.99  CALCIUM 8.4* 8.4*   PT/INR No results for input(s): LABPROT, INR in the last 72 hours. CMP     Component Value Date/Time   NA 141 06/02/2015 0641   K 4.1 06/02/2015 0641   CL 107 06/02/2015 0641   CO2 28 06/02/2015 0641   GLUCOSE 103* 06/02/2015 0641   GLUCOSE 83 03/18/2006 0953   BUN 6 06/02/2015 0641   CREATININE 0.99 06/02/2015 0641   CALCIUM 8.4* 06/02/2015 0641   PROT 7.6 05/31/2015 0505   ALBUMIN 4.3 05/31/2015 0505   AST 26 05/31/2015 0505   ALT 36 05/31/2015 0505   ALKPHOS 61 05/31/2015 0505   BILITOT 1.1 05/31/2015 0505   GFRNONAA >60 06/02/2015 0641   GFRAA >60 06/02/2015 0641   Lipase     Component Value Date/Time   LIPASE 21 05/31/2015 0505       Studies/Results: Ct Abdomen Pelvis W Contrast  05/31/2015   CLINICAL DATA:  Left-sided abdominal pain with nausea and vomiting for 1 day EXAM: CT ABDOMEN AND PELVIS WITH CONTRAST TECHNIQUE: Multidetector CT imaging of the abdomen and pelvis was performed using the standard protocol following bolus administration of intravenous contrast. CONTRAST:  168mL ISOVUE-300 IOPAMIDOL (ISOVUE-300) INJECTION 61% COMPARISON:  Jul 31, 2014. FINDINGS: Lower chest:  Lung bases are clear. Hepatobiliary: There is hepatic steatosis. Beyond the hepatic steatosis, no focal liver lesions are evident. Gallbladder is absent. There is no biliary duct dilatation. Pancreas: No pancreatic mass or inflammatory focus appreciable. Spleen: No splenic lesions evident. Adrenals/Urinary Tract: Right adrenal appears unremarkable. There is a stable 1.1 x 1.1 cm left adrenal adenoma. There is a cyst arising from the lateral right kidney measuring 7.9 x 7.3 cm. There is a cyst arising from the medial mid right kidney measuring 2.6 x 2.8 cm. There is no hydronephrosis on either side. There is no renal or ureteral calculus on either side. Urinary bladder is midline with wall thickness within normal limits. Stomach/Bowel: The patient has had a gastric sleeve resection without wall thickening or fluid in this area. There is dilatation of multiple loops of small bowel without a discrete transition zone. Most of these bowel  loops are fluid filled. There is no appreciable colonic dilatation. There is no appreciable bowel wall thickening. The mild mesenteric thickening in the mid abdomen noted on prior study persists without change. There is no free air or portal venous air. There are multiple sigmoid diverticula without diverticulitis. Vascular/Lymphatic: There is no abdominal aortic aneurysm. No vascular lesions are evident. Multiple small lymph nodes are again noted throughout the mid abdominal mesenteric region without change. No new lymph node prominence is seen. There are several subcentimeter retroperitoneal lymph  nodes which are also stable. By size criteria there is no frank adenopathy in the abdomen or pelvis. Reproductive: Prostate and seminal vesicles appear normal. There is no pelvic mass or pelvic fluid collection. Other: Appendix is not appreciated. There is no periappendiceal region inflammation. No abscess or ascites in the abdomen or pelvis. There is a small ventral hernia containing only fat. Musculoskeletal: No blastic or lytic 1 bone lesions are evident. There is no intramuscular or abdominal wall lesion. IMPRESSION: Mild generalized small bowel dilatation. A well-defined transition zone is not seen. The appearance is more consistent with ileus than bowel obstruction. A low grade partial obstruction at the level of the distal most aspect of the ileum cannot be entirely excluded. There is persistent mesenteric thickening at the root of the mesentery which was present previously and has not progressed. Small lymph nodes in this area are stable. These changes may be secondary to chronic enteritis, although chronic sclerosing mesenteritis or lymphoproliferative disorder remain differential considerations. Status post gastric sleeve resection without complicating feature in this region. Hepatic steatosis.  Gallbladder absent. No bowel obstruction.  No ascites or abscess. There are sigmoid diverticula without diverticulitis. There is a small ventral hernia containing only fat. No renal or ureteral calculus. No hydronephrosis. There is a prominent cyst arising from the right kidney laterally measuring 7.9 x 7.3 cm. There is a small stable left adrenal adenoma. Electronically Signed   By: Lowella Grip III M.D.   On: 05/31/2015 10:41   Dg Abd 2 Views  06/01/2015  CLINICAL DATA:  Abdominal pain. EXAM: ABDOMEN - 2 VIEW COMPARISON:  CT 05/31/2015 . FINDINGS: Persistent dilated loops of small bowel noted. Small bowel distention may have increased slightly from prior CT. Air is noted in colon. These findings are  consistent with adynamic ileus versus partial small bowel obstruction. No free air. Degenerative changes lumbar spine. IMPRESSION: Persistent small bowel distention. Possible slight increase in small bowel distention from prior CT study of 05/31/2015. Air is noted in the colon. These findings are consistent with adynamic ileus versus partial small bowel obstruction. No free air. Continued follow-up exam suggested. Electronically Signed   By: Marcello Moores  Register   On: 06/01/2015 07:47    Anti-infectives: Anti-infectives    None       Assessment/Plan Abdominal pain/distension - ileus likely due to enteritis -C.diff negative -IVF, pain control, antiemetics -Has had significant clinical improvement with continued diarrhea.  No more pain. -Advance to clears, then soft diet if tolerating -Hopefully d/c home later today if he can tolerate soft diet -Will sign off, call with questions/concerns Enteritis/diarrhea -GI Panel pending Hx of cholecystectomy 02/14/15, and gastric sleeve 10/2010. Morbid obesity BMI 56.6 Hypertension ADHD GERD Hemorrhoids      LOS: 1 day    Nat Christen 06/02/2015, 7:46 AM Pager: [phone]  (7am - 4:30pm M-F; 7am - 11:30am Sa/Su)

## 2015-06-02 NOTE — Progress Notes (Signed)
[facility]                                                                              Patient Demographics  [name] [name], is a 45 y.o. male, DOB - 08-09-1970, [id number]  Admit date - 05/31/2015   Admitting Physician Waldemar Dickens, MD  Outpatient Primary MD for the patient is [name], MD  LOS - 1  days    Chief Complaint  Patient presents with  . Abdominal Pain       Brief HPI  Patient is a 45 year old male with sleep apnea, hypertension, GERD, hemorrhoids, ADHD, hypertension, prior abdominal surgeries including gastric sleeve and cholecystectomy, history of prior small bowel obstructions. Patient presented to the hospital with complaints of generalized abdominal pain and intractable emesis. Patient reports that beginning Saturday night Sunday morning he began having multiple loose stools that were painless and without blood. Last night he began having significant emesis after eating a [employer] sandwich. He states he felt quite bloated and like the food was getting stuck in his stomach. This is typical to his previous presentations for small bowel obstruction. Patient states he has had multiple small bowel obstruction episodes the last being 2 years ago. These typically resolve with bowel rest. He has not tolerated NG tube in the past. CT abdomen showed ileus versus SBO   Assessment & Plan    SBO vs ileus: Improved - Patient with history of recurrent small bowel obstructions and admitted again with dilated small bowel, abdominal pain and intractable nausea and vomiting without flatus - CT abdomen showed mild generalized small bowel dilatation, ileus versus small bowel obstruction - X-ray abdomen showed persistent small bowel distention, patient refused NG tube placement - Gen. surgery was consulted, recommended conservative management, patient's pain is improved, started on diet today - C. difficile negative, GI pathogen panel pending  Acute  diarrhea: Per patient started 2 days before the admission, ? Enteritis, states 6-7 BMs today -  GI consulted, C. difficile negative, GI pathogen panel pending    Morbid obesity/ OSA -Provide nocturnal CPAP -BMI 56.6   HYPERTENSION, BENIGN ESSENTIAL -Holding home Norvasc -prn hydralazine   Hepatic steatosis -Likely related to ongoing obesity -LFTs are normal   ADHD  -Not on pharmacotherapy prior to admission   Hemorrhoids -Begin Proctofoam   GERD -Continue Protonix but convert to IV  Code Status: full   Family Communication: Discussed in detail with the patient, all imaging results, lab results explained to the patient   Disposition Plan  Time Spent in minutes 25 minutes  Procedures  CT abdomen   Consults   General surgery  Gastroenterology  DVT Prophylaxis  Lovenox   Medications  Scheduled Meds: . enoxaparin (LOVENOX) injection  0.5 mg/kg Subcutaneous Q24H  . pantoprazole (PROTONIX) IV  40 mg Intravenous Q24H   Continuous Infusions: . dextrose 5 % and 0.9 % NaCl with KCl 20 mEq/L 125 mL/hr at 06/02/15 0623   PRN Meds:.acetaminophen **OR** acetaminophen, albuterol, fentaNYL (SUBLIMAZE) injection, ondansetron (ZOFRAN) IV, pramoxine, prochlorperazine   Antibiotics   Anti-infectives    None        Subjective:   [name] Key  [name] was seen and examined today.   reports continues to have diarrhea, 6-8 episodes this morning however no abdominal pain today. Patient denies dizziness, chest pain, shortness of breath,  new weakness, numbess, tingling. No acute events overnight.    Objective:   Filed Vitals:   06/01/15 0809 06/01/15 1536 06/01/15 2229 06/02/15 0451  BP:  131/81 155/101 149/97  Pulse:  72 78 69  Temp:  98.1 F (36.7 C) 98.7 F (37.1 C) 97.7 F (36.5 C)  TempSrc:   Oral Oral  Resp:  20 20 20   Height:      Weight:      SpO2: 98%  100% 97%    Intake/Output Summary (Last 24 hours) at 06/02/15 1315 Last data filed at 06/02/15  0626  Gross per 24 hour  Intake 3054.17 ml  Output    250 ml  Net 2804.17 ml     Wt Readings from Last 3 Encounters:  05/31/15 193.595 kg (426 lb 12.8 oz)  05/11/15 198.222 kg (437 lb)  04/26/15 197.224 kg (434 lb 12.8 oz)     Exam  General: Alert and oriented x 3, NAD, irritable   HEENT:    Neck:   CVS: S1 S2 auscultated, no rubs, murmurs or gallops. Regular rate and rhythm.  Respiratory: Clear to auscultation bilaterally, no wheezing, rales or rhonchi  Abdomen: Soft, NT, ND, NBS   Ext: no cyanosis clubbing or edema  Neuro: no new deficits   Skin: No rashes  Psych: Normal affect and demeanor, alert and oriented x3    Data Reviewed:  I have personally reviewed following labs and imaging studies  Micro Results Recent Results (from the past 240 hour(s))  C difficile quick scan w PCR reflex     Status: None   Collection Time: 05/31/15  8:43 PM  Result Value Ref Range Status   C Diff antigen NEGATIVE NEGATIVE Final   C Diff toxin NEGATIVE NEGATIVE Final   C Diff interpretation Negative for toxigenic C. difficile  Final    Radiology Reports Ct Abdomen Pelvis W Contrast  05/31/2015  CLINICAL DATA:  Left-sided abdominal pain with nausea and vomiting for 1 day EXAM: CT ABDOMEN AND PELVIS WITH CONTRAST TECHNIQUE: Multidetector CT imaging of the abdomen and pelvis was performed using the standard protocol following bolus administration of intravenous contrast. CONTRAST:  159mL ISOVUE-300 IOPAMIDOL (ISOVUE-300) INJECTION 61% COMPARISON:  Jul 31, 2014. FINDINGS: Lower chest:  Lung bases are clear. Hepatobiliary: There is hepatic steatosis. Beyond the hepatic steatosis, no focal liver lesions are evident. Gallbladder is absent. There is no biliary duct dilatation. Pancreas: No pancreatic mass or inflammatory focus appreciable. Spleen: No splenic lesions evident. Adrenals/Urinary Tract: Right adrenal appears unremarkable. There is a stable 1.1 x 1.1 cm left adrenal adenoma.  There is a cyst arising from the lateral right kidney measuring 7.9 x 7.3 cm. There is a cyst arising from the medial mid right kidney measuring 2.6 x 2.8 cm. There is no hydronephrosis on either side. There is no renal or ureteral calculus on either side. Urinary bladder is midline with wall thickness within normal limits. Stomach/Bowel: The patient has had a gastric sleeve resection without wall thickening or fluid in this area. There is dilatation of multiple loops of small bowel without a discrete transition zone. Most of these bowel loops are fluid filled. There is no appreciable colonic dilatation. There is no appreciable bowel wall thickening. The mild mesenteric thickening in the mid abdomen noted on prior study persists without  change. There is no free air or portal venous air. There are multiple sigmoid diverticula without diverticulitis. Vascular/Lymphatic: There is no abdominal aortic aneurysm. No vascular lesions are evident. Multiple small lymph nodes are again noted throughout the mid abdominal mesenteric region without change. No new lymph node prominence is seen. There are several subcentimeter retroperitoneal lymph nodes which are also stable. By size criteria there is no frank adenopathy in the abdomen or pelvis. Reproductive: Prostate and seminal vesicles appear normal. There is no pelvic mass or pelvic fluid collection. Other: Appendix is not appreciated. There is no periappendiceal region inflammation. No abscess or ascites in the abdomen or pelvis. There is a small ventral hernia containing only fat. Musculoskeletal: No blastic or lytic 1 bone lesions are evident. There is no intramuscular or abdominal wall lesion. IMPRESSION: Mild generalized small bowel dilatation. A well-defined transition zone is not seen. The appearance is more consistent with ileus than bowel obstruction. A low grade partial obstruction at the level of the distal most aspect of the ileum cannot be entirely excluded.  There is persistent mesenteric thickening at the root of the mesentery which was present previously and has not progressed. Small lymph nodes in this area are stable. These changes may be secondary to chronic enteritis, although chronic sclerosing mesenteritis or lymphoproliferative disorder remain differential considerations. Status post gastric sleeve resection without complicating feature in this region. Hepatic steatosis.  Gallbladder absent. No bowel obstruction.  No ascites or abscess. There are sigmoid diverticula without diverticulitis. There is a small ventral hernia containing only fat. No renal or ureteral calculus. No hydronephrosis. There is a prominent cyst arising from the right kidney laterally measuring 7.9 x 7.3 cm. There is a small stable left adrenal adenoma. Electronically Signed   By: Lowella Grip III M.D.   On: 05/31/2015 10:41   Dg Abd 2 Views  06/01/2015  CLINICAL DATA:  Abdominal pain. EXAM: ABDOMEN - 2 VIEW COMPARISON:  CT 05/31/2015 . FINDINGS: Persistent dilated loops of small bowel noted. Small bowel distention may have increased slightly from prior CT. Air is noted in colon. These findings are consistent with adynamic ileus versus partial small bowel obstruction. No free air. Degenerative changes lumbar spine. IMPRESSION: Persistent small bowel distention. Possible slight increase in small bowel distention from prior CT study of 05/31/2015. Air is noted in the colon. These findings are consistent with adynamic ileus versus partial small bowel obstruction. No free air. Continued follow-up exam suggested. Electronically Signed   By: Marcello Moores  Register   On: 06/01/2015 07:47    CBC  Recent Labs Lab 05/31/15 0505 06/01/15 0709 06/02/15 0641  WBC 7.3 3.0* 2.6*  HGB 15.7 14.4 13.9  HCT 45.5 42.6 39.8  PLT 180 139* 126*  MCV 83.9 84.5 84.5  MCH 29.0 28.6 29.5  MCHC 34.5 33.8 34.9  RDW 12.5 12.6 12.5    Chemistries   Recent Labs Lab 05/31/15 0505 06/01/15 0557  06/01/15 0709 06/02/15 0641  NA 136 138 138 141  K 4.0 3.7 3.8 4.1  CL 104 107 107 107  CO2 21* 23 22 28   GLUCOSE 121* 101* 99 103*  BUN 9 7 7 6   CREATININE 1.12 0.93 0.95 0.99  CALCIUM 9.2 8.2* 8.4* 8.4*  MG  --   --  2.2  --   AST 26  --   --   --   ALT 36  --   --   --   ALKPHOS 61  --   --   --  BILITOT 1.1  --   --   --    ------------------------------------------------------------------------------------------------------------------ estimated creatinine clearance is 172.6 mL/min (by C-G formula based on Cr of 0.99). ------------------------------------------------------------------------------------------------------------------ No results for input(s): HGBA1C in the last 72 hours. ------------------------------------------------------------------------------------------------------------------ No results for input(s): CHOL, HDL, LDLCALC, TRIG, CHOLHDL, LDLDIRECT in the last 72 hours. ------------------------------------------------------------------------------------------------------------------ No results for input(s): TSH, T4TOTAL, T3FREE, THYROIDAB in the last 72 hours.  Invalid input(s): FREET3 ------------------------------------------------------------------------------------------------------------------ No results for input(s): VITAMINB12, FOLATE, FERRITIN, TIBC, IRON, RETICCTPCT in the last 72 hours.  Coagulation profile No results for input(s): INR, PROTIME in the last 168 hours.  No results for input(s): DDIMER in the last 72 hours.  Cardiac Enzymes No results for input(s): CKMB, TROPONINI, MYOGLOBIN in the last 168 hours.  Invalid input(s): CK ------------------------------------------------------------------------------------------------------------------ Invalid input(s): POCBNP  No results for input(s): GLUCAP in the last 72 hours.   RAI,RIPUDEEP M.D. [facility] 06/02/2015, 1:15 PM  Pager: [phone] Between 7am to 7pm - call Pager -  [phone]  After 7pm go to [email or web address] - password [email or web address]  Call night coverage person covering after 7pm

## 2015-06-02 NOTE — Progress Notes (Signed)
Patient stated he doesn't wear CPAP at home and would not like to wear here at the hospital.  RT advised patient if he changed his mind to advise the RN and respiratory would supply patient with a CPAP. RT will continue to monitor.

## 2015-06-03 ENCOUNTER — Encounter (HOSPITAL_COMMUNITY): Payer: Self-pay | Admitting: Physician Assistant

## 2015-06-03 LAB — CBC
HCT: 40.5 % (ref 39.0–52.0)
HEMOGLOBIN: 14.3 g/dL (ref 13.0–17.0)
MCH: 29.3 pg (ref 26.0–34.0)
MCHC: 35.3 g/dL (ref 30.0–36.0)
MCV: 83 fL (ref 78.0–100.0)
Platelets: 133 10*3/uL — ABNORMAL LOW (ref 150–400)
RBC: 4.88 MIL/uL (ref 4.22–5.81)
RDW: 12.5 % (ref 11.5–15.5)
WBC: 3 10*3/uL — ABNORMAL LOW (ref 4.0–10.5)

## 2015-06-03 LAB — BASIC METABOLIC PANEL
Anion gap: 8 (ref 5–15)
BUN: 5 mg/dL — ABNORMAL LOW (ref 6–20)
CALCIUM: 8.6 mg/dL — AB (ref 8.9–10.3)
CO2: 25 mmol/L (ref 22–32)
Chloride: 106 mmol/L (ref 101–111)
Creatinine, Ser: 0.94 mg/dL (ref 0.61–1.24)
GFR calc Af Amer: >60 mL/min (ref >60)
GFR calc non Af Amer: >60 mL/min (ref >60)
GLUCOSE: 106 mg/dL — AB (ref 65–99)
Potassium: 3.9 mmol/L (ref 3.5–5.1)
Sodium: 139 mmol/L (ref 135–145)

## 2015-06-03 MED ORDER — LOPERAMIDE HCL 2 MG PO TABS: 2.0000 mg | ORAL_TABLET | Freq: Three times a day (TID) | ORAL | Status: DC | PRN

## 2015-06-03 MED ORDER — PRAMOXINE HCL 1 % RE FOAM: Freq: Three times a day (TID) | RECTAL | Status: DC | PRN

## 2015-06-03 MED ORDER — TRAMADOL HCL 50 MG PO TABS: 50.0000 mg | ORAL_TABLET | Freq: Three times a day (TID) | ORAL | Status: DC | PRN

## 2015-06-03 MED ORDER — CHOLESTYRAMINE 4 G PO PACK: 4.0000 g | PACK | ORAL | Status: DC | PRN

## 2015-06-03 MED ORDER — PROMETHAZINE HCL 12.5 MG PO TABS: 12.5000 mg | ORAL_TABLET | Freq: Four times a day (QID) | ORAL | Status: DC | PRN

## 2015-06-03 NOTE — Discharge Summary (Signed)
Physician Discharge Summary   Patient ID: [name] [name] MRN: [id number] DOB/AGE: 1970/06/09 45 y.o.  Admit date: 05/31/2015 Discharge date: 06/03/2015  Primary Care Physician:  [name], MD  Discharge Diagnoses:   . PSBO/ileus with the diarrhea and left abdominal pain  . Morbid obesity ([place]) . ADHD (attention deficit hyperactivity disorder) . HYPERTENSION, BENIGN ESSENTIAL . Hemorrhoids . GERD . OSA (obstructive sleep apnea)- refused CPAP during hospitalization  . Hepatic steatosis  Consults:    Gastroenterology, Dr. Nuala Alpha. Surgery, Dr Hulen Skains  Recommendations for Outpatient Follow-up:  1. The patient was reminded to follow-up with Gen. surgery and gastroenterology, will need colonoscopy and further workup outpatient 2. Please repeat CBC/BMET at next visit, if diarrhea, the need stool studies to rule out pancreatic insufficiency   DIET: Heart healthy diet    Allergies:   Allergies  Allergen Reactions  . Bee Venom Anaphylaxis  . Codeine Anaphylaxis    Tolerates dilaudid  . Hornet Venom Anaphylaxis  . Penicillins Hives    Has patient had a PCN reaction causing immediate rash, facial/tongue/throat swelling, SOB or lightheadedness with hypotensionYES Has patient had a PCN reaction causing severe rash involving mucus membranes or skin necrosis: NO Has patient had a PCN reaction that required hospitalization NO Has patient had a PCN reaction occurring within the last 10 years: NO If all of the above answers are "NO", then may proceed with Cephalosporin use.     DISCHARGE MEDICATIONS: Current Discharge Medication List    START taking these medications   Details  loperamide (IMODIUM A-D) 2 MG tablet Take 1 tablet (2 mg total) by mouth 3 (three) times daily as needed for diarrhea or loose stools (OVER THE COUNTER MEDICATION). Qty: 30 tablet, Refills: 0    pramoxine (PROCTOFOAM) 1 % foam Place rectally 3 (three) times daily as needed for itching. Qty: 15 g,  Refills: 2    promethazine (PHENERGAN) 12.5 MG tablet Take 1 tablet (12.5 mg total) by mouth every 6 (six) hours as needed for nausea or vomiting. Qty: 30 tablet, Refills: 0    traMADol (ULTRAM) 50 MG tablet Take 1 tablet (50 mg total) by mouth every 8 (eight) hours as needed for severe pain. Qty: 30 tablet, Refills: 0      CONTINUE these medications which have NOT CHANGED   Details  albuterol (PROVENTIL HFA;VENTOLIN HFA) 108 (90 Base) MCG/ACT inhaler Inhale 2 puffs into the lungs every 6 (six) hours as needed for wheezing or shortness of breath. Qty: 1 Inhaler, Refills: 0   Associated Diagnoses: Wheezing    amLODipine (NORVASC) 5 MG tablet Take 1 tablet (5 mg total) by mouth daily. Qty: 90 tablet, Refills: 3    omeprazole (PRILOSEC) 20 MG capsule Take 1 capsule (20 mg total) by mouth daily. Qty: 30 capsule, Refills: 0   Associated Diagnoses: Gastroesophageal reflux disease, esophagitis presence not specified         Brief H and P: For complete details please refer to admission H and P, but in brief Patient is a 45 year old male with sleep apnea, hypertension, GERD, hemorrhoids, ADHD, hypertension, prior abdominal surgeries including gastric sleeve and cholecystectomy, history of prior small bowel obstructions. Patient presented to the hospital with complaints of generalized abdominal pain and intractable emesis. Patient reported that beginning Saturday night, he began having multiple loose stools that were painless and without blood. A night before the admission, he began having significant emesis after eating a [employer] sandwich. He felt quite bloated and like the food was getting stuck  in his stomach. This is typical to his previous presentations for small bowel obstruction. Patient stated he has had multiple small bowel obstruction episodes the last being 2 years ago. These typically resolve with bowel rest. He has not tolerated NG tube in the past. CT abdomen showed ileus versus  SBO  Hospital Course:   SBO vs ileus, abdominal pain and diarrhea: Improved, doubt infectious enteritis - Patient has history of recurrent small bowel obstructions and admitted again with dilated small bowel, abdominal pain and intractable nausea and vomiting without flatus. At the time of presentation, he reported that his symptoms were typical to his previous presentations for small bowel obstruction. CT abdomen showed mild generalized small bowel dilatation, ileus versus small bowel obstruction. He had refused NG tube in the ED. Patient was placed on IV fluids and NPO status. X-ray abdomen next a.m. showed persistent small bowel distention, patient refused NG tube placement. Hence Gen. surgery was consulted who recommended conservative management. He also was interested to get his gastric sleeve functional again, he wants to schedule his appointment with [facility] on his own. Subsequently patient continued to complain of multiple episodes of diarrhea (6-8 episodes on the morning of 3/30) although his abdominal pain had improved. Diarrhea had improved on NPO status. C. difficile was negative, GI pathogen panel was negative. Gastroenterology was then consulted. Patient was seen by Dr Silverio Decamp. GI recommended to restrict his diet to clear liquids as his diarrhea was triggered once he was started on solids. He was also placed on cholestyramine trial by GI to control the diarrhea which improved after cholestyramine. However patient became extremely upset about clear liquid diet and cholestyramine stating that he tends to have constipation normally and does not want the cholestyramine. Cholestyramine was then discontinued and per GI, continue as needed Imodium for diarrhea. Patient was also recommended GI appointment, however states that he will call office to arrange appointment per his own schedule. GI plans to have outpatient colonoscopy and further workup to rule out possible pancreatic insufficiency.  CT abdomen  had also shown persistent mesenteric thickening at the root of mesentery which was present previously and has not progressed, small lymph nodes in the area are stable, may be secondary to chronic enteritis although sclerosing mesenteritis or lymphoproliferative disorder remain on the differential. CRP level low 1.0, ESR 24. GI plans to follow it up outpatient.  Patient was explained all the lab results, imagings, consultant recommendations and the plan of care in detail in the presence of the charge nurse, Antoine. Patient was given the opportunity to ask questions, concerns and all his questions were addressed. Patient was extremely rude and dismissive of me and then stated that he wants to be discharged and he will follow up with gastroenterology with his own choice and schedule outpatient.     Morbid obesity/ OSA -CPAP order was placed however patient refused to be on CPAP during the hospitalization.  -BMI 56.6   HYPERTENSION, BENIGN ESSENTIAL -Continue amlodipine   Hepatic steatosis -Likely related to ongoing obesity, Alcohol use (has a history of binge-type alcohol intake) -LFTs are normal   ADHD  -Not on pharmacotherapy prior to admission   Hemorrhoids -Begin Proctofoam   GERD -Continue Protonix but convert to IV  Day of Discharge BP 138/92 mmHg  Pulse 64  Temp(Src) 97.9 F (36.6 C) (Oral)  Resp 18  Ht 6' 2"  (1.88 m)  Wt 193.595 kg (426 lb 12.8 oz)  BMI 54.77 kg/m2  SpO2 93%  Physical Exam: General: Alert  and awake oriented x3 not in any acute distress, irritable and agitated. HEENT: anicteric sclera, pupils reactive to light and accommodation CVS: S1-S2 clear no murmur rubs or gallops Chest: clear to auscultation bilaterally, no wheezing rales or rhonchi Abdomen: soft nontender, nondistended, normal bowel sounds Extremities: no cyanosis, clubbing or edema noted bilaterally    The results of significant diagnostics from this hospitalization (including  imaging, microbiology, ancillary and laboratory) are listed below for reference.    LAB RESULTS: Basic Metabolic Panel:  Recent Labs Lab 06/01/15 0709 06/02/15 0641 06/03/15 0619  NA 138 141 139  K 3.8 4.1 3.9  CL 107 107 106  CO2 22 28 25   GLUCOSE 99 103* 106*  BUN 7 6 5*  CREATININE 0.95 0.99 0.94  CALCIUM 8.4* 8.4* 8.6*  MG 2.2  --   --    Liver Function Tests:  Recent Labs Lab 05/31/15 0505  AST 26  ALT 36  ALKPHOS 61  BILITOT 1.1  PROT 7.6  ALBUMIN 4.3    Recent Labs Lab 05/31/15 0505  LIPASE 21   No results for input(s): AMMONIA in the last 168 hours. CBC:  Recent Labs Lab 06/02/15 0641 06/03/15 0619  WBC 2.6* 3.0*  HGB 13.9 14.3  HCT 39.8 40.5  MCV 84.5 83.0  PLT 126* 133*   Cardiac Enzymes: No results for input(s): CKTOTAL, CKMB, CKMBINDEX, TROPONINI in the last 168 hours. BNP: Invalid input(s): POCBNP CBG: No results for input(s): GLUCAP in the last 168 hours.  Significant Diagnostic Studies:  Ct Abdomen Pelvis W Contrast  05/31/2015  CLINICAL DATA:  Left-sided abdominal pain with nausea and vomiting for 1 day EXAM: CT ABDOMEN AND PELVIS WITH CONTRAST TECHNIQUE: Multidetector CT imaging of the abdomen and pelvis was performed using the standard protocol following bolus administration of intravenous contrast. CONTRAST:  149m ISOVUE-300 IOPAMIDOL (ISOVUE-300) INJECTION 61% COMPARISON:  Jul 31, 2014. FINDINGS: Lower chest:  Lung bases are clear. Hepatobiliary: There is hepatic steatosis. Beyond the hepatic steatosis, no focal liver lesions are evident. Gallbladder is absent. There is no biliary duct dilatation. Pancreas: No pancreatic mass or inflammatory focus appreciable. Spleen: No splenic lesions evident. Adrenals/Urinary Tract: Right adrenal appears unremarkable. There is a stable 1.1 x 1.1 cm left adrenal adenoma. There is a cyst arising from the lateral right kidney measuring 7.9 x 7.3 cm. There is a cyst arising from the medial mid right  kidney measuring 2.6 x 2.8 cm. There is no hydronephrosis on either side. There is no renal or ureteral calculus on either side. Urinary bladder is midline with wall thickness within normal limits. Stomach/Bowel: The patient has had a gastric sleeve resection without wall thickening or fluid in this area. There is dilatation of multiple loops of small bowel without a discrete transition zone. Most of these bowel loops are fluid filled. There is no appreciable colonic dilatation. There is no appreciable bowel wall thickening. The mild mesenteric thickening in the mid abdomen noted on prior study persists without change. There is no free air or portal venous air. There are multiple sigmoid diverticula without diverticulitis. Vascular/Lymphatic: There is no abdominal aortic aneurysm. No vascular lesions are evident. Multiple small lymph nodes are again noted throughout the mid abdominal mesenteric region without change. No new lymph node prominence is seen. There are several subcentimeter retroperitoneal lymph nodes which are also stable. By size criteria there is no frank adenopathy in the abdomen or pelvis. Reproductive: Prostate and seminal vesicles appear normal. There is no pelvic mass or pelvic  fluid collection. Other: Appendix is not appreciated. There is no periappendiceal region inflammation. No abscess or ascites in the abdomen or pelvis. There is a small ventral hernia containing only fat. Musculoskeletal: No blastic or lytic 1 bone lesions are evident. There is no intramuscular or abdominal wall lesion. IMPRESSION: Mild generalized small bowel dilatation. A well-defined transition zone is not seen. The appearance is more consistent with ileus than bowel obstruction. A low grade partial obstruction at the level of the distal most aspect of the ileum cannot be entirely excluded. There is persistent mesenteric thickening at the root of the mesentery which was present previously and has not progressed. Small  lymph nodes in this area are stable. These changes may be secondary to chronic enteritis, although chronic sclerosing mesenteritis or lymphoproliferative disorder remain differential considerations. Status post gastric sleeve resection without complicating feature in this region. Hepatic steatosis.  Gallbladder absent. No bowel obstruction.  No ascites or abscess. There are sigmoid diverticula without diverticulitis. There is a small ventral hernia containing only fat. No renal or ureteral calculus. No hydronephrosis. There is a prominent cyst arising from the right kidney laterally measuring 7.9 x 7.3 cm. There is a small stable left adrenal adenoma. Electronically Signed   By: Lowella Grip III M.D.   On: 05/31/2015 10:41   Dg Abd 2 Views  06/01/2015  CLINICAL DATA:  Abdominal pain. EXAM: ABDOMEN - 2 VIEW COMPARISON:  CT 05/31/2015 . FINDINGS: Persistent dilated loops of small bowel noted. Small bowel distention may have increased slightly from prior CT. Air is noted in colon. These findings are consistent with adynamic ileus versus partial small bowel obstruction. No free air. Degenerative changes lumbar spine. IMPRESSION: Persistent small bowel distention. Possible slight increase in small bowel distention from prior CT study of 05/31/2015. Air is noted in the colon. These findings are consistent with adynamic ileus versus partial small bowel obstruction. No free air. Continued follow-up exam suggested. Electronically Signed   By: Marcello Moores  Register   On: 06/01/2015 07:47    2D ECHO:   Disposition and Follow-up: Discharge Instructions    Diet - low sodium heart healthy    Complete by:  As directed      Increase activity slowly    Complete by:  As directed             DISPOSITION: home    DISCHARGE FOLLOW-UP Follow-up Information    Follow up with [name], MD. Schedule an appointment as soon as possible for a visit in 10 days.   Specialty:  Family Medicine   Why:  for hospital  follow-up   Contact information:   [address] [place] [address] [phone]       Follow up with Judeth Horn, MD. Schedule an appointment as soon as possible for a visit in 2 weeks.   Specialty:  General Surgery   Why:  for hospital follow-up (surgery) regarding the gastric sleeve   Contact information:   [address] [place] [address] [phone]       Call Milus Banister, MD.   Specialty:  Gastroenterology   Why:  call office 06/18/15 for appointement with Dr Ardis Hughs or one of the PAs at the GI office ~ 06/18/15 to follow up episodes of pain and diarrhea and to set up long overdue colonoscopy.   Contact information:   [address] [place] [address] [phone]        Time spent on Discharge: 18mns  SignedEstill Cotta M.D. [facility] 06/03/2015, 9:43 AM Pager: [phone]

## 2015-06-03 NOTE — Progress Notes (Signed)
Charge RN entered the room per patient request, Patient requesting a new doctor other than Dr Tana Coast.  When asking patient his reasoning he stated he couldn't understand her.  I clairfied with the patient was it her accent or her explanation because Dr Tana Coast speaks English well.  He stated he didn't understand what she was explaining to him.  I paged Dr. Tana Coast to inform her of the patient request and Dr. Tana Coast and myself went to the patient's room to explain to him his plan of care from admission to current day. Patient questions answered and addressed. Patient appeared not to be listening attentive by looking away from doctor while she was speaking by gazing at the wall. . I attempted to explain and repeat information the Doctor was explaining in simple details to patient. Patient was stating that he is being told different information by different doctors. Dr.Rai had all the consult notes recommendations, lab results, test results highlighted and explain them in detail to patient. We spend time 20 minutes doing this.

## 2015-06-03 NOTE — Progress Notes (Signed)
Daily Rounding Note  06/03/2015, 8:13 AM  LOS: 2 days   SUBJECTIVE:       Pt unhappy with just about everything.  Upset that someone changed diet back to clears, then after he protested that MD had ok'd solid, carb mod diet (Dr Hulen Skains had ok'd for regular diet) he finally got solids.  Then the tray did not arrive til 9PM.  Upset that no one advised him of need to collect stool spec for fecal elastase and lactoferrin.  However, he has not had BM overnight.  Unhappy about being reminded he needs a better diet and to lose weight.  Not very interested in discussing or understanding his dx of fatty liver and need for him to stop or significantly decrease ETOH intake.   Some pain continues in left abdomen, regardless of whether he is on clears or solids.  Plan is for discharge home.  Pt wants to be the one to schedule appointments due to his complicated work schedule.   OBJECTIVE:         Vital signs in last 24 hours:    Temp:  [97.9 F (36.6 C)-98.1 F (36.7 C)] 97.9 F (36.6 C) (03/31 0630) Pulse Rate:  [64-79] 64 (03/31 0630) Resp:  [18] 18 (03/31 0630) BP: (138-160)/(91-92) 138/92 mmHg (03/31 0630) SpO2:  [93 %-99 %] 93 % (03/31 0630) Last BM Date: 06/02/15 Filed Weights   05/31/15 0451 05/31/15 1532  Weight: 194.14 kg (428 lb) 193.595 kg (426 lb 12.8 oz)   General: comfortable, morbidly obese.     Heart: RRR Chest: clear bil.  No cough or labored breathing Abdomen:  Obese, active BS, NT Extremities: no CCE Neuro/Psych:  Oriented x 3.  Somewhat agitated and edgy, short-fused.   Intake/Output from previous day: 03/30 0701 - 03/31 0700 In: 3540.4 [P.O.:480; I.V.:3060.4] Out: -   Intake/Output this shift:    Lab Results:  Recent Labs  06/01/15 0709 06/02/15 0641 06/03/15 0619  WBC 3.0* 2.6* 3.0*  HGB 14.4 13.9 14.3  HCT 42.6 39.8 40.5  PLT 139* 126* 133*   BMET  Recent Labs  06/01/15 0709 06/02/15 0641  06/03/15 0619  NA 138 141 139  K 3.8 4.1 3.9  CL 107 107 106  CO2 22 28 25   GLUCOSE 99 103* 106*  BUN 7 6 5*  CREATININE 0.95 0.99 0.94  CALCIUM 8.4* 8.4* 8.6*   LFT No results for input(s): PROT, ALBUMIN, AST, ALT, ALKPHOS, BILITOT, BILIDIR, IBILI in the last 72 hours. PT/INR No results for input(s): LABPROT, INR in the last 72 hours. Hepatitis Panel No results for input(s): HEPBSAG, HCVAB, HEPAIGM, HEPBIGM in the last 72 hours.  Studies/Results: No results found.  ASSESMENT:   * PSBO/ileus with diarrhea and left abdominal pain.  One of several since his 2012 gastric sleeve bariatric surgery. Doubt infectious enteritis (cdiff negative for the second time within past 12 months)  ? Inflammatory enteritis: sclerosing mesenteritis?  Stable SB mesenteric thickening and lymphadenopathy on CT scans: ? Reactive vs lymphoproliferative disorder. CRP level 1.0 (rr = <1.0), ESR 24.  Ordered tests not yet collected include pancreatic elastase, fecal lactoferrin.   * Supra morbid obesity. Gastric sleeve failure.   * Fatty liver  * Thrombocytopenia, leukopenia.   * Hx tubular adenoma of colon 2007. 4 years overdue for surveillance colonoscopy.  * Supra morbid Obesity. Gastric sleeve failure.   *  OSA.  Has never been used CPAP, his own wish  and choice.   * Fatty liver.  Binge type ETOH intake (up to 8 beers per day or 6 to 7 Bourbon drinks per day on irregular basis. Sometimes nothing for 2 weeks, then drinking for 3 to 4 days in a row), which he dismisses as inconsequential and not a problem.  Note his grandmother had cirrhosis, she did not drink ETOH and was not obese per pt report.   * Thrombocytopenia, leukopenia.   * Hx tubular adenoma of colon 2007. 4 years overdue for surveillance colonoscopy.   *  Hyperglycemia.     PLAN   *  ROV with GI.  Pt says he will call office to arrange appt with GI APP or MD. Contact info provided.  GI MD is Dr Ardis Hughs, not Dr  Silverio Decamp.   *  Given his fatty liver he needs to cut back vs cease (preferred) drinking ETOH.   *  Prn immodium (easiest for pt to acquire) if recurrent diarrhea, though tends to constipation normally.   *  Surgery suggested he call their office to arrange visit with bariatric surgeon for consult re attempt to get his gastric sleeve functional again.  If he did have another surgery, then biopsy of SB mesentery and lymph nodes could be performed.   *  Ok to discharge even if stool spec for pancreatic elastase and lactoferrin have not been collected.     [name] [name]  06/03/2015, 8:13 AM Pager: [phone]

## 2015-06-03 NOTE — Progress Notes (Signed)
[name] to be D/C'd Home per MD order. Discussed with the patient and all questions fully answered.    Medication List    TAKE these medications        albuterol 108 (90 Base) MCG/ACT inhaler  Commonly known as:  PROVENTIL HFA;VENTOLIN HFA  Inhale 2 puffs into the lungs every 6 (six) hours as needed for wheezing or shortness of breath.     amLODipine 5 MG tablet  Commonly known as:  NORVASC  Take 1 tablet (5 mg total) by mouth daily.     loperamide 2 MG tablet  Commonly known as:  IMODIUM A-D  Take 1 tablet (2 mg total) by mouth 3 (three) times daily as needed for diarrhea or loose stools (OVER THE COUNTER MEDICATION).     omeprazole 20 MG capsule  Commonly known as:  PRILOSEC  Take 1 capsule (20 mg total) by mouth daily.     pramoxine 1 % foam  Commonly known as:  PROCTOFOAM  Place rectally 3 (three) times daily as needed for itching.     promethazine 12.5 MG tablet  Commonly known as:  PHENERGAN  Take 1 tablet (12.5 mg total) by mouth every 6 (six) hours as needed for nausea or vomiting.     traMADol 50 MG tablet  Commonly known as:  ULTRAM  Take 1 tablet (50 mg total) by mouth every 8 (eight) hours as needed for severe pain.        VVS, Skin clean, dry and intact without evidence of skin break down, no evidence of skin tears noted.  IV catheter discontinued intact. Site without signs and symptoms of complications. Dressing and pressure applied.  An After Visit Summary was printed and given to the patient.  Patient refused to be escorted, walked out on his own. D/C home via private auto.  [name] [name]  06/03/2015 10:33 AM

## 2015-06-24 ENCOUNTER — Telehealth: Payer: Self-pay

## 2015-06-24 NOTE — Telephone Encounter (Signed)
Pt needs to have hosp f/u with jacobs or extender in 4-6 weeks. Judson Roch called and no appt available

## 2015-06-24 NOTE — Telephone Encounter (Signed)
06/29/15 9 am  Alonza Bogus PA appt Left message on machine to call back

## 2015-06-27 NOTE — Telephone Encounter (Signed)
Left message on machine to call back  

## 2015-06-29 ENCOUNTER — Ambulatory Visit: Payer: Self-pay | Admitting: Gastroenterology

## 2015-06-29 NOTE — Telephone Encounter (Signed)
Pt showed for appt with Janett Billow

## 2015-07-24 ENCOUNTER — Encounter (HOSPITAL_COMMUNITY): Payer: Self-pay | Admitting: Emergency Medicine

## 2015-07-24 DIAGNOSIS — L03115 Cellulitis of right lower limb: Secondary | ICD-10-CM | POA: Insufficient documentation

## 2015-07-24 DIAGNOSIS — Z8669 Personal history of other diseases of the nervous system and sense organs: Secondary | ICD-10-CM | POA: Diagnosis not present

## 2015-07-24 DIAGNOSIS — Z79899 Other long term (current) drug therapy: Secondary | ICD-10-CM | POA: Diagnosis not present

## 2015-07-24 DIAGNOSIS — K219 Gastro-esophageal reflux disease without esophagitis: Secondary | ICD-10-CM | POA: Diagnosis not present

## 2015-07-24 DIAGNOSIS — I10 Essential (primary) hypertension: Secondary | ICD-10-CM | POA: Diagnosis not present

## 2015-07-24 DIAGNOSIS — Z880 Allergy status to penicillin: Secondary | ICD-10-CM | POA: Diagnosis not present

## 2015-07-24 DIAGNOSIS — Z87891 Personal history of nicotine dependence: Secondary | ICD-10-CM | POA: Diagnosis not present

## 2015-07-24 DIAGNOSIS — Z8709 Personal history of other diseases of the respiratory system: Secondary | ICD-10-CM | POA: Insufficient documentation

## 2015-07-24 DIAGNOSIS — R2241 Localized swelling, mass and lump, right lower limb: Secondary | ICD-10-CM | POA: Diagnosis present

## 2015-07-24 LAB — CBC WITH DIFFERENTIAL/PLATELET
BASOS ABS: 0 10*3/uL (ref 0.0–0.1)
BASOS PCT: 0 %
EOS ABS: 0.1 10*3/uL (ref 0.0–0.7)
EOS PCT: 1 %
HEMATOCRIT: 40.6 % (ref 39.0–52.0)
Hemoglobin: 14 g/dL (ref 13.0–17.0)
Lymphocytes Relative: 27 %
Lymphs Abs: 1.9 10*3/uL (ref 0.7–4.0)
MCH: 29 pg (ref 26.0–34.0)
MCHC: 34.5 g/dL (ref 30.0–36.0)
MCV: 84.2 fL (ref 78.0–100.0)
MONO ABS: 0.6 10*3/uL (ref 0.1–1.0)
Monocytes Relative: 8 %
NEUTROS ABS: 4.5 10*3/uL (ref 1.7–7.7)
Neutrophils Relative %: 64 %
PLATELETS: 168 10*3/uL (ref 150–400)
RBC: 4.82 MIL/uL (ref 4.22–5.81)
RDW: 12.8 % (ref 11.5–15.5)
WBC: 7 10*3/uL (ref 4.0–10.5)

## 2015-07-24 LAB — COMPREHENSIVE METABOLIC PANEL
ALBUMIN: 4 g/dL (ref 3.5–5.0)
ALT: 39 U/L (ref 17–63)
AST: 26 U/L (ref 15–41)
Alkaline Phosphatase: 56 U/L (ref 38–126)
Anion gap: 8 (ref 5–15)
BUN: 14 mg/dL (ref 6–20)
CHLORIDE: 104 mmol/L (ref 101–111)
CO2: 25 mmol/L (ref 22–32)
Calcium: 9 mg/dL (ref 8.9–10.3)
Creatinine, Ser: 1.08 mg/dL (ref 0.61–1.24)
GFR calc Af Amer: >60 mL/min (ref >60)
GFR calc non Af Amer: >60 mL/min (ref >60)
GLUCOSE: 110 mg/dL — AB (ref 65–99)
POTASSIUM: 3.5 mmol/L (ref 3.5–5.1)
SODIUM: 137 mmol/L (ref 135–145)
Total Bilirubin: 0.9 mg/dL (ref 0.3–1.2)
Total Protein: 7 g/dL (ref 6.5–8.1)

## 2015-07-24 NOTE — ED Notes (Signed)
Pt. reports right lower leg swelling / redness with mild pain onset 4 days ago , denies injury or fever ,ambulatory .

## 2015-07-25 ENCOUNTER — Telehealth: Payer: Self-pay

## 2015-07-25 ENCOUNTER — Emergency Department (HOSPITAL_COMMUNITY)
Admission: EM | Admit: 2015-07-25 | Discharge: 2015-07-25 | Disposition: A | Discharge status: Home / Self Care | Payer: BC Managed Care – PPO | Attending: Emergency Medicine | Admitting: Emergency Medicine

## 2015-07-25 ENCOUNTER — Ambulatory Visit (HOSPITAL_BASED_OUTPATIENT_CLINIC_OR_DEPARTMENT_OTHER)
Admission: RE | Admit: 2015-07-25 | Discharge: 2015-07-25 | Disposition: A | Discharge status: Home / Self Care | Payer: BC Managed Care – PPO | Source: Ambulatory Visit | Attending: Emergency Medicine | Admitting: Emergency Medicine

## 2015-07-25 DIAGNOSIS — L03115 Cellulitis of right lower limb: Secondary | ICD-10-CM

## 2015-07-25 DIAGNOSIS — M79661 Pain in right lower leg: Secondary | ICD-10-CM

## 2015-07-25 DIAGNOSIS — M79609 Pain in unspecified limb: Secondary | ICD-10-CM

## 2015-07-25 DIAGNOSIS — M7989 Other specified soft tissue disorders: Secondary | ICD-10-CM

## 2015-07-25 MED ORDER — ENOXAPARIN SODIUM 150 MG/ML ~~LOC~~ SOLN
1.0000 mg/kg | Freq: Once | SUBCUTANEOUS | Status: AC
  Administered 2015-07-25: 195 mg via SUBCUTANEOUS
  Filled 2015-07-25: qty 1.29

## 2015-07-25 MED ORDER — CLINDAMYCIN HCL 150 MG PO CAPS
300.0000 mg | ORAL_CAPSULE | Freq: Once | ORAL | Status: AC
  Administered 2015-07-25: 300 mg via ORAL
  Filled 2015-07-25: qty 2

## 2015-07-25 MED ORDER — ACETAMINOPHEN 325 MG PO TABS
650.0000 mg | ORAL_TABLET | Freq: Once | ORAL | Status: AC
  Administered 2015-07-25: 650 mg via ORAL
  Filled 2015-07-25: qty 2

## 2015-07-25 MED ORDER — CLINDAMYCIN HCL 150 MG PO CAPS: 300.0000 mg | ORAL_CAPSULE | Freq: Four times a day (QID) | ORAL | Status: DC

## 2015-07-25 NOTE — ED Provider Notes (Signed)
CSN: [id number]     Arrival date & time 07/24/15  2156 History  By signing my name below, I, [name] [name], attest that this documentation has been prepared under the direction and in the presence of [name], MD . Electronically Signed: Rowan [name], Scribe. 07/25/2015. 12:58 AM.   Chief Complaint  Patient presents with  . Leg Pain   The history is provided by the patient. No language interpreter was used.   HPI Comments:  [name] [name] is a 45 y.o. male with PMhx of HTN who presents to the Emergency Department complaining of worsening right lower leg swelling onset 4 days ago. Pt reports associated 7/10 pain in right leg, "isolated heat" in his right leg, and redness to his right shin beginning ~7 hours ago. No alleviating factors noted or treatments attempted PTA. He called the nurse on duty at [employer] and was referred to the ED. Pt notes cholecystectomy in November; he was also hospitalized for 4-5 days 2-3 months ago for ileus. He works on his feet for 12 hours/day. Denies h/o blood clots, recent long travel, fever, or injury to leg.  Past Medical History  Diagnosis Date  . GERD (gastroesophageal reflux disease)   . Hypertension   . Obesity   . Chronic bronchitis ([place])   . Sleep apnea     "suppose to wear mask but I don't" (05/31/2015)   Past Surgical History  Procedure Laterality Date  . Colonoscopy  10/16/2005    Polyps (tubular adenoma 2007), external hemorrhoids  . 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 Cholecystectomy N/A 02/14/2015    Procedure: LAPAROSCOPIC CHOLECYSTECTOMY WITH INTRAOPERATIVE CHOLANGIOGRAM;  Surgeon: Coralie Keens, MD;  Location: [facility] OR;  Service: General;  Laterality: N/A;   Family History  Problem Relation Age of Onset  . Diabetes Mellitus II Mother   . Breast cancer Mother   . Diabetes Mellitus II Father   . CAD Father   . Stroke Father   . Cancer Father   . Cirrhosis Maternal Grandmother    Social  History  Substance Use Topics  . Smoking status: Former Smoker -- 3.00 packs/day for 0 years    Types: Cigarettes    Quit date: 01/13/2008  . Smokeless tobacco: Current User    Types: Chew, Snuff  . Alcohol Use: 7.8 oz/week    13 Shots of liquor per week     Comment: 05/31/2015 "1 pint of liquor/wk"    Review of Systems  Constitutional: Negative for fever.  Cardiovascular: Positive for leg swelling.  Musculoskeletal: Positive for arthralgias.  Skin: Positive for color change. Negative for wound.  All other systems reviewed and are negative.   Allergies  Bee venom; Codeine; Hornet venom; and Penicillins  Home Medications   Prior to Admission medications   Medication Sig Start Date End Date Taking? Authorizing Provider  albuterol (PROVENTIL HFA;VENTOLIN HFA) 108 (90 Base) MCG/ACT inhaler Inhale 2 puffs into the lungs every 6 (six) hours as needed for wheezing or shortness of breath. 05/11/15   [name], NP  amLODipine (NORVASC) 5 MG tablet Take 1 tablet (5 mg total) by mouth daily. 10/06/14   Lucille Passy, MD  clindamycin (CLEOCIN) 150 MG capsule Take 2 capsules (300 mg total) by mouth 4 (four) times daily. 07/25/15   [name], MD  loperamide (IMODIUM A-D) 2 MG tablet Take 1 tablet (2 mg total) by mouth 3 (three) times daily as needed for diarrhea or  loose stools (OVER THE COUNTER MEDICATION). 06/03/15   Ripudeep Krystal Eaton, MD  omeprazole (PRILOSEC) 20 MG capsule Take 1 capsule (20 mg total) by mouth daily. 04/26/15   [name], NP  pramoxine (PROCTOFOAM) 1 % foam Place rectally 3 (three) times daily as needed for itching. 06/03/15   Ripudeep Krystal Eaton, MD  promethazine (PHENERGAN) 12.5 MG tablet Take 1 tablet (12.5 mg total) by mouth every 6 (six) hours as needed for nausea or vomiting. 06/03/15   Ripudeep Krystal Eaton, MD  traMADol (ULTRAM) 50 MG tablet Take 1 tablet (50 mg total) by mouth every 8 (eight) hours as needed for severe pain. 06/03/15   Ripudeep K Rai, MD   BP 135/98  mmHg  Pulse 76  Temp(Src) 97.6 F (36.4 C) (Oral)  Resp 16  Wt 426 lb (193.232 kg)  SpO2 100% Physical Exam  Constitutional: He is oriented to person, place, and time. He appears well-developed and well-nourished.  Morbidly obese  HENT:  Head: Normocephalic and atraumatic.  Cardiovascular: Normal rate and regular rhythm.   Pulmonary/Chest: Effort normal. No respiratory distress.  Musculoskeletal: He exhibits edema.  Mild asymmetric lower extremity edema right greater than left, tenderness palpation of the anterior shin, evidence of petechial rash with underlying blanching erythema over the anterior shin, tenderness palpation of the calf  Neurological: He is alert and oriented to person, place, and time.  Skin: Skin is warm and dry.  Psychiatric: He has a normal mood and affect.  Nursing note and vitals reviewed.   ED Course  Procedures  DIAGNOSTIC STUDIES:  Oxygen Saturation is 96% on RA, normal by my interpretation.    COORDINATION OF CARE:  12:54 AM Will evaluate lab work, administer blood thinner and schedule follow-up for ultrasound tomorrow morning. Will start on antibiotics and pain medication. Discussed treatment plan with pt at bedside and pt agreed to plan.  Labs Review Labs Reviewed  COMPREHENSIVE METABOLIC PANEL - Abnormal; Notable for the following:    Glucose, Bld 110 (*)    All other components within normal limits  CBC WITH DIFFERENTIAL/PLATELET    Imaging Review No results found. I have personally reviewed and evaluated these images and lab results as part of my medical decision-making.   EKG Interpretation None      MDM   Final diagnoses:  Leg swelling  Cellulitis of right lower extremity    Patient presents with pain and swelling of her right calf. Nontoxic-appearing. Afebrile. He does have signs of likely early cellulitis over the anterior shin as well as a petechial rash. Platelets are normal. He does have tenderness to palpation over the  posterior calf as well. DVT is consideration. Patient was given a dose of Lovenox and Keflex. He is otherwise non-ill appearing. Will have patient return tomorrow for an ultrasound of his right lower extremity. In the meantime will treat for cellulitis. Primary physician. Patient was given strict return precautions.  After history, exam, and medical workup I feel the patient has been appropriately medically screened and is safe for discharge home. Pertinent diagnoses were discussed with the patient. Patient was given return precautions.    I personally performed the services described in this documentation, which was scribed in my presence. The recorded information has been reviewed and is accurate.    [name], MD 07/25/15 [id number]

## 2015-07-25 NOTE — Telephone Encounter (Signed)
Per chart review tab pt seen [place] on 07/25/15.

## 2015-07-25 NOTE — Telephone Encounter (Signed)
PLEASE NOTE: All timestamps contained within this report are represented as [place] Standard Time. CONFIDENTIALTY NOTICE: This fax transmission is intended only for the addressee. It contains information that is legally privileged, confidential or otherwise protected from use or disclosure. If you are not the intended recipient, you are strictly prohibited from reviewing, disclosing, copying using or disseminating any of this information or taking any action in reliance on or regarding this information. If you have received this fax in error, please notify us immediately by telephone so that we can arrange for its return to [place]. Phone: [phone], Toll-Free: [phone], Fax: [phone] Page: 1 of 2 Call Id: [id number] [place] Patient Name: [name] [name] Gender: Male DOB: 1970-06-08 Age: 45 Y 36 M 1 D Return Phone Number: [phone] (Primary) Address: City/State/Zip: [place] Client [facility] - Client Client Site [place] Physician [name] - MD Contact Type Call Who Is Calling Patient / Member / Family / Caregiver Call Type Triage / Clinical Relationship To Patient Self Return Phone Number [phone] (Primary) Chief Complaint Leg Pain Reason for Call Symptomatic / Request for [place] states his right leg is swollen and has a lot of heat and pain from it. PreDisposition Did not know what to do Translation No Nurse Assessment Nurse: Solocinski, RN, Beth Date/Time (Eastern Time): 07/24/2015 8:55:05 PM Confirm and document reason for call. If symptomatic, describe symptoms. You must click the next button to save text entered. ---Caller states his right leg is swollen on his shin and it is warm to the touch. Caller states also pain about 4/10. Caller states the swelling on one spot on his  shin since Friday and worsened since then. Has the patient traveled out of the country within the last 30 days? ---No Does the patient have any new or worsening symptoms? ---Yes Will a triage be completed? ---Yes Related visit to physician within the last 2 weeks? ---No Does the PT have any chronic conditions? (i.e. diabetes, asthma, etc.) ---Yes List chronic conditions. ---htn, gastric bypass, asthma Is this a behavioral health or substance abuse call? ---No Guidelines Guideline Title Affirmed Question Affirmed Notes Nurse Date/Time Eilene Ghazi Time) Leg Pain [1] Thigh, calf, or ankle swelling AND [2] bilateral AND [3] 1 side is more swollen Solocinski, RN, Beth 07/24/2015 8:59:55 PM Disp. Time Eilene Ghazi Time) Disposition Final User 07/24/2015 9:05:50 PM See Physician within 4 Hours (or PCP triage) Yes Solocinski, RN, Beth PLEASE NOTE: All timestamps contained within this report are represented as [place] Standard Time. CONFIDENTIALTY NOTICE: This fax transmission is intended only for the addressee. It contains information that is legally privileged, confidential or otherwise protected from use or disclosure. If you are not the intended recipient, you are strictly prohibited from reviewing, disclosing, copying using or disseminating any of this information or taking any action in reliance on or regarding this information. If you have received this fax in error, please notify us immediately by telephone so that we can arrange for its return to [place]. Phone: [phone], Toll-Free: [phone], Fax: [phone] Page: 2 of 2 Call Id: [id number] [place] Understands: Yes Disagree/Comply: Comply Care Advice Given Per Guideline SEE PHYSICIAN WITHIN 4 HOURS (or PCP triage): * IF OFFICE WILL BE CLOSED AND NO PCP TRIAGE: You need to be seen within the next 3 or 4 hours. A nearby Urgent Care Center is often a good source of care.  Another choice is to go to the ER. Go sooner if you become worse.  CALL EMS IF: You develop any chest pain or shortness of breath. CARE ADVICE given per Leg Pain (Adult) guideline. Referrals GO TO FACILITY UNDECIDED

## 2015-07-25 NOTE — Progress Notes (Signed)
VASCULAR LAB PRELIMINARY  PRELIMINARY  PRELIMINARY  PRELIMINARY  Right lower extremity venous duplex completed.    Preliminary report:  Right:  No evidence of DVT, superficial thrombosis, or Baker's cyst.  Cinch Ormond, RVS 07/25/2015, 9:46 AM

## 2015-07-25 NOTE — Discharge Instructions (Signed)
You were seen today for leg swelling and pain. You may have an early infection of your leg. Blood clot is also a consideration. You were given a blood thinner and need to return tomorrow for an ultrasound. If he develops shortness of breath or chest pain you need to be reevaluated immediately.  Cellulitis Cellulitis is an infection of the skin and the tissue beneath it. The infected area is usually red and tender. Cellulitis occurs most often in the arms and lower legs.  CAUSES  Cellulitis is caused by bacteria that enter the skin through cracks or cuts in the skin. The most common types of bacteria that cause cellulitis are staphylococci and streptococci. SIGNS AND SYMPTOMS   Redness and warmth.  Swelling.  Tenderness or pain.  Fever. DIAGNOSIS  Your health care provider can usually determine what is wrong based on a physical exam. Blood tests may also be done. TREATMENT  Treatment usually involves taking an antibiotic medicine. HOME CARE INSTRUCTIONS   Take your antibiotic medicine as directed by your health care provider. Finish the antibiotic even if you start to feel better.  Keep the infected arm or leg elevated to reduce swelling.  Apply a warm cloth to the affected area up to 4 times per day to relieve pain.  Take medicines only as directed by your health care provider.  Keep all follow-up visits as directed by your health care provider. SEEK MEDICAL CARE IF:   You notice red streaks coming from the infected area.  Your red area gets larger or turns dark in color.  Your bone or joint underneath the infected area becomes painful after the skin has healed.  Your infection returns in the same area or another area.  You notice a swollen bump in the infected area.  You develop new symptoms.  You have a fever. SEEK IMMEDIATE MEDICAL CARE IF:   You feel very sleepy.  You develop vomiting or diarrhea.  You have a general ill feeling (malaise) with muscle aches and  pains.   This information is not intended to replace advice given to you by your health care provider. Make sure you discuss any questions you have with your health care provider.   Document Released: 11/29/2004 Document Revised: 11/10/2014 Document Reviewed: 05/07/2011 Elsevier Interactive Patient Education 2016 Elsevier Inc[employer]  Edema Edema is an abnormal buildup of fluids in your bodytissues. Edema is somewhatdependent on gravity to pull the fluid to the lowest place in your body. That makes the condition more common in the legs and thighs (lower extremities). Painless swelling of the feet and ankles is common and becomes more likely as you get older. It is also common in looser tissues, like around your eyes.  When the affected area is squeezed, the fluid may move out of that spot and leave a dent for a few moments. This dent is called pitting.  CAUSES  There are many possible causes of edema. Eating too much salt and being on your feet or sitting for a long time can cause edema in your legs and ankles. Hot weather may make edema worse. Common medical causes of edema include:  Heart failure.  Liver disease.  Kidney disease.  Weak blood vessels in your legs.  Cancer.  An injury.  Pregnancy.  Some medications.  Obesity. SYMPTOMS  Edema is usually painless.Your skin may look swollen or shiny.  DIAGNOSIS  Your health care provider may be able to diagnose edema by asking about your medical history and doing a  physical exam. You may need to have tests such as X-rays, an electrocardiogram, or blood tests to check for medical conditions that may cause edema.  TREATMENT  Edema treatment depends on the cause. If you have heart, liver, or kidney disease, you need the treatment appropriate for these conditions. General treatment may include:  Elevation of the affected body part above the level of your heart.  Compression of the affected body part. Pressure from elastic bandages or  support stockings squeezes the tissues and forces fluid back into the blood vessels. This keeps fluid from entering the tissues.  Restriction of fluid and salt intake.  Use of a water pill (diuretic). These medications are appropriate only for some types of edema. They pull fluid out of your body and make you urinate more often. This gets rid of fluid and reduces swelling, but diuretics can have side effects. Only use diuretics as directed by your health care provider. HOME CARE INSTRUCTIONS   Keep the affected body part above the level of your heart when you are lying down.   Do not sit still or stand for prolonged periods.   Do not put anything directly under your knees when lying down.  Do not wear constricting clothing or garters on your upper legs.   Exercise your legs to work the fluid back into your blood vessels. This may help the swelling go down.   Wear elastic bandages or support stockings to reduce ankle swelling as directed by your health care provider.   Eat a low-salt diet to reduce fluid if your health care provider recommends it.   Only take medicines as directed by your health care provider. SEEK MEDICAL CARE IF:   Your edema is not responding to treatment.  You have heart, liver, or kidney disease and notice symptoms of edema.  You have edema in your legs that does not improve after elevating them.   You have sudden and unexplained weight gain. SEEK IMMEDIATE MEDICAL CARE IF:   You develop shortness of breath or chest pain.   You cannot breathe when you lie down.  You develop pain, redness, or warmth in the swollen areas.   You have heart, liver, or kidney disease and suddenly get edema.  You have a fever and your symptoms suddenly get worse. MAKE SURE YOU:   Understand these instructions.  Will watch your condition.  Will get help right away if you are not doing well or get worse.   This information is not intended to replace advice  given to you by your health care provider. Make sure you discuss any questions you have with your health care provider.   Document Released: 02/19/2005 Document Revised: 03/12/2014 Document Reviewed: 12/12/2012 Elsevier Interactive Patient Education [employer].

## 2015-07-29 ENCOUNTER — Other Ambulatory Visit: Payer: Self-pay | Admitting: Primary Care

## 2015-09-26 ENCOUNTER — Encounter (HOSPITAL_COMMUNITY): Payer: Self-pay | Admitting: Emergency Medicine

## 2015-09-26 ENCOUNTER — Emergency Department (HOSPITAL_COMMUNITY)
Admission: EM | Admit: 2015-09-26 | Discharge: 2015-09-26 | Disposition: A | Discharge status: Home / Self Care | Payer: Worker's Compensation | Attending: Emergency Medicine | Admitting: Emergency Medicine

## 2015-09-26 ENCOUNTER — Emergency Department (HOSPITAL_COMMUNITY): Payer: Worker's Compensation

## 2015-09-26 DIAGNOSIS — Y929 Unspecified place or not applicable: Secondary | ICD-10-CM | POA: Diagnosis not present

## 2015-09-26 DIAGNOSIS — I10 Essential (primary) hypertension: Secondary | ICD-10-CM | POA: Diagnosis not present

## 2015-09-26 DIAGNOSIS — S63502A Unspecified sprain of left wrist, initial encounter: Secondary | ICD-10-CM | POA: Diagnosis not present

## 2015-09-26 DIAGNOSIS — M25532 Pain in left wrist: Secondary | ICD-10-CM | POA: Diagnosis present

## 2015-09-26 DIAGNOSIS — Y990 Civilian activity done for income or pay: Secondary | ICD-10-CM | POA: Insufficient documentation

## 2015-09-26 DIAGNOSIS — Z87891 Personal history of nicotine dependence: Secondary | ICD-10-CM | POA: Diagnosis not present

## 2015-09-26 DIAGNOSIS — Y939 Activity, unspecified: Secondary | ICD-10-CM | POA: Diagnosis not present
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
## 2015-09-26 NOTE — Discharge Instructions (Signed)
Elevate and apply ice packs on/off to your wrist.  Tylenol or ibuprofen if needed.  Call Dr. Ruthe Mannan office to arrange a follow-up appt I one week if not improving
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
## 2015-10-12 ENCOUNTER — Ambulatory Visit (INDEPENDENT_AMBULATORY_CARE_PROVIDER_SITE_OTHER): Payer: BC Managed Care – PPO | Admitting: Family Medicine

## 2015-10-12 ENCOUNTER — Encounter: Payer: Self-pay | Admitting: Family Medicine

## 2015-10-12 VITALS — BP 162/106 | HR 82 | Temp 98.1°F | Wt >= 6400 oz

## 2015-10-12 DIAGNOSIS — R198 Other specified symptoms and signs involving the digestive system and abdomen: Secondary | ICD-10-CM | POA: Diagnosis not present

## 2015-10-12 DIAGNOSIS — Z0289 Encounter for other administrative examinations: Secondary | ICD-10-CM | POA: Diagnosis not present

## 2015-10-12 DIAGNOSIS — K5669 Other intestinal obstruction: Secondary | ICD-10-CM

## 2015-10-12 DIAGNOSIS — K56609 Unspecified intestinal obstruction, unspecified as to partial versus complete obstruction: Secondary | ICD-10-CM

## 2015-10-12 HISTORY — DX: Other specified symptoms and signs involving the digestive system and abdomen: R19.8

## 2015-10-12 NOTE — Assessment & Plan Note (Signed)
Likely recurrent symptoms from his previous abdominal surgeries and absent gall bladder. No red flag symptoms or signs on exam. Add probiotic. Call or return to clinic prn if these symptoms worsen or fail to improve as anticipated. The patient indicates understanding of these issues and agrees with the plan.

## 2015-10-12 NOTE — Assessment & Plan Note (Signed)
Forms started with pt in room, will complete and return to pt.

## 2015-10-12 NOTE — Progress Notes (Signed)
Subjective:   Patient ID: [name] [name], male    DOB: 1970/04/30, 45 y.o.   MRN: [id number]  Matteus Street is a pleasant 45 y.o. year old male who presents to clinic today with Constipation and Diarrhea  on 10/12/2015  HPI:   Remote h/o gastric sleeve surgery in 2012. H/o cholecystectomy 02/2015 Then hospitalized with SBO in 05/2015.  Since SBO in 05/2105, feels his bowels have not been the same.  Alternates between constipation and diarrhea.  No nausea, vomiting or abdominal pain.  No fevers.   Was out of work for his gallbladder surgery and subsequent SBO.  Has paper work with him to fill out and return to his insurance company.    Current Outpatient Prescriptions on File Prior to Visit  Medication Sig Dispense Refill  . albuterol (PROVENTIL HFA;VENTOLIN HFA) 108 (90 Base) MCG/ACT inhaler Inhale 2 puffs into the lungs every 6 (six) hours as needed for wheezing or shortness of breath. 1 Inhaler 0  . amLODipine (NORVASC) 5 MG tablet Take 1 tablet (5 mg total) by mouth daily. 90 tablet 3  . omeprazole (PRILOSEC) 20 MG capsule TAKE ONE CAPSULE BY MOUTH ONCE DAILY 30 capsule 5   No current facility-administered medications on file prior to visit.     Allergies  Allergen Reactions  . Bee Venom Anaphylaxis  . Codeine Anaphylaxis    Tolerates dilaudid  . Hornet Venom Anaphylaxis  . Penicillins Hives    Has patient had a PCN reaction causing immediate rash, facial/tongue/throat swelling, SOB or lightheadedness with hypotensionYES Has patient had a PCN reaction causing severe rash involving mucus membranes or skin necrosis: NO Has patient had a PCN reaction that required hospitalization NO Has patient had a PCN reaction occurring within the last 10 years: NO If all of the above answers are "NO", then may proceed with Cephalosporin use.    Past Medical History:  Diagnosis Date  . Chronic bronchitis ([place])   . GERD (gastroesophageal reflux disease)   . Hypertension   .  Obesity   . Sleep apnea    "suppose to wear mask but I don't" (05/31/2015)    Past Surgical History:  Procedure Laterality Date  . CHOLECYSTECTOMY N/A 02/14/2015   Procedure: LAPAROSCOPIC CHOLECYSTECTOMY WITH INTRAOPERATIVE CHOLANGIOGRAM;  Surgeon: Coralie Keens, MD;  Location: [facility];  Service: General;  Laterality: N/A;  . COLONOSCOPY  10/16/2005   Polyps (tubular adenoma 2007), external hemorrhoids  . LAPAROSCOPIC GASTRIC SLEEVE RESECTION  10/2010    at [facility] History  Problem Relation Age of Onset  . Diabetes Mellitus II Mother   . Breast cancer Mother   . Diabetes Mellitus II Father   . CAD Father   . Stroke Father   . Cancer Father   . Cirrhosis Maternal Grandmother     Social History   Social History  . Marital status: Married    Spouse name: N/A  . Number of children: 1  . Years of education: N/A   Occupational History  . corrections officer [employer]   Social History Main Topics  . Smoking status: Former Smoker    Packs/day: 3.00    Years: 0.00    Types: Cigarettes    Quit date: 01/13/2008  . Smokeless tobacco: Current User    Types: Chew, Snuff  . Alcohol use 7.8 oz/week    13 Shots of liquor per week     Comment: 05/31/2015 "1 pint of liquor/wk"  . Drug use: No  . Sexual  activity: Yes   Other Topics Concern  . Not on file   Social History Narrative  . No narrative on file   The PMH, PSH, Social History, Family History, Medications, and allergies have been reviewed in [facility], and have been updated if relevant.   Review of Systems  Gastrointestinal: Positive for constipation and diarrhea. Negative for abdominal distention, abdominal pain, anal bleeding, blood in stool, nausea, rectal pain and vomiting.  Musculoskeletal: Negative.   All other systems reviewed and are negative.      Objective:    BP (!) 162/106   Pulse 82   Temp 98.1 F (36.7 C) (Oral)   Wt (!) 437 lb 8 oz (198.4 kg)   SpO2 98%   BMI 56.17 kg/m     Physical Exam  Constitutional: He is oriented to person, place, and time. He appears well-developed and well-nourished. No distress.  HENT:  Head: Normocephalic.  Eyes: Conjunctivae are normal.  Cardiovascular: Normal rate.   Pulmonary/Chest: Effort normal.  Abdominal: Soft. Bowel sounds are normal. He exhibits no distension and no mass. There is no tenderness. There is no rebound and no guarding.  Musculoskeletal: Normal range of motion.  Neurological: He is alert and oriented to person, place, and time. No cranial nerve deficit.  Skin: Skin is warm and dry. He is not diaphoretic.  Psychiatric: He has a normal mood and affect. His behavior is normal. Judgment and thought content normal.  Nursing note and vitals reviewed.         Assessment & Plan:   SBO (small bowel obstruction) ([facility])  Alternating constipation and diarrhea  Encounter for completion of form with patient No Follow-up on file.

## 2015-10-12 NOTE — Progress Notes (Signed)
Pre visit review using our clinic review tool, if applicable. No additional management support is needed unless otherwise documented below in the visit note. 

## 2015-10-12 NOTE — Patient Instructions (Signed)
Great to see you.  Try OTC probiotics like Align or cultuerelle daily.

## 2015-10-19 ENCOUNTER — Encounter (HOSPITAL_COMMUNITY): Payer: Self-pay

## 2015-10-19 ENCOUNTER — Emergency Department (HOSPITAL_COMMUNITY): Payer: BC Managed Care – PPO

## 2015-10-19 ENCOUNTER — Emergency Department (HOSPITAL_COMMUNITY)
Admission: EM | Admit: 2015-10-19 | Discharge: 2015-10-19 | Disposition: A | Discharge status: Home / Self Care | Payer: BC Managed Care – PPO | Attending: Emergency Medicine | Admitting: Emergency Medicine

## 2015-10-19 DIAGNOSIS — Z79899 Other long term (current) drug therapy: Secondary | ICD-10-CM | POA: Insufficient documentation

## 2015-10-19 DIAGNOSIS — I10 Essential (primary) hypertension: Secondary | ICD-10-CM | POA: Insufficient documentation

## 2015-10-19 DIAGNOSIS — R0789 Other chest pain: Secondary | ICD-10-CM | POA: Insufficient documentation

## 2015-10-19 DIAGNOSIS — F439 Reaction to severe stress, unspecified: Secondary | ICD-10-CM | POA: Diagnosis not present

## 2015-10-19 DIAGNOSIS — G479 Sleep disorder, unspecified: Secondary | ICD-10-CM

## 2015-10-19 DIAGNOSIS — J45909 Unspecified asthma, uncomplicated: Secondary | ICD-10-CM | POA: Insufficient documentation

## 2015-10-19 DIAGNOSIS — Z87891 Personal history of nicotine dependence: Secondary | ICD-10-CM | POA: Diagnosis not present

## 2015-10-19 HISTORY — DX: Unspecified asthma, uncomplicated: J45.909

## 2015-10-19 LAB — BASIC METABOLIC PANEL
Anion gap: 6 (ref 5–15)
BUN: 9 mg/dL (ref 6–20)
CO2: 26 mmol/L (ref 22–32)
Calcium: 9 mg/dL (ref 8.9–10.3)
Chloride: 104 mmol/L (ref 101–111)
Creatinine, Ser: 0.95 mg/dL (ref 0.61–1.24)
GFR calc Af Amer: >60 mL/min (ref >60)
GFR calc non Af Amer: >60 mL/min (ref >60)
Glucose, Bld: 104 mg/dL — ABNORMAL HIGH (ref 65–99)
Potassium: 4 mmol/L (ref 3.5–5.1)
Sodium: 136 mmol/L (ref 135–145)

## 2015-10-19 LAB — CBC
HCT: 44.7 % (ref 39.0–52.0)
Hemoglobin: 15.2 g/dL (ref 13.0–17.0)
MCH: 29.7 pg (ref 26.0–34.0)
MCHC: 34 g/dL (ref 30.0–36.0)
MCV: 87.5 fL (ref 78.0–100.0)
Platelets: 168 10*3/uL (ref 150–400)
RBC: 5.11 MIL/uL (ref 4.22–5.81)
RDW: 12.4 % (ref 11.5–15.5)
WBC: 5.6 10*3/uL (ref 4.0–10.5)

## 2015-10-19 LAB — I-STAT TROPONIN, ED: Troponin i, poc: 0.01 ng/mL (ref 0.00–0.08)

## 2015-10-19 NOTE — Discharge Instructions (Signed)
Substance Abuse Treatment Programs ° °Intensive Outpatient Programs °[facility]     °[address]      °[place], [place]                   °[phone]      ° °[facility] °[address] °[place], [place] °[phone] ° °[facility]     °(Inpatient and outpatient)     °[address]           °[phone]   ° °[facility] °[phone] (Suboxone and Methadone) ° °[address]      °[place], [place] [address]      °[phone]      ° °[address] °[place], [place] °[phone] ° °[facility] (Outpatient/Inpatient, Chemical)    °(insurance only) [phone]      °       °[facility] (Groups & Residential) °[place], [place] °[phone] ° °   °[facility]     °[address]     °[place], [place]      °[phone]      ° °[facility] (for caregivers and family) °[address] °[place], [place] °336-299-4655 ° ° ° ° ° °Residential Treatment Programs °[facility]      °[address], [place], [place] [address]  °[phone]      ° °[facility] °[address] [place], [place] [address] °[phone] ° °[facility]        °[phone]      ° °[facility] °[phone] ° °[facility]             °[address]                                         °[place], [place]                                                °[phone] or [phone]                              ° °[facility] [place] °[address] °[place], [address] °[phone] ° °[facility]    °[name]      °[place], [place]     °[phone]      ° °[facility] °[address] °, [place] °[phone] ° °[facility]   °[address]     °[place], [place] [address]     °[phone]      °Admissions: 8am-3pm M-F ° °[facility] °[address] °[place],  [place] °[phone] ° °[facility]: Residential Program (90 Days)   °[place], [place]      °336-725-8389 or 800-758-6077    ° °[facility] °[place], [place] °(Walk in Hours over the weekend or by referral) ° °[facility] °[address], [place],  [address] °[phone] ° °Crisis Mobile: [facility]:  1-877-626-1772 (for crisis response 24 hours a day) °[facility] Hotline:      1-800-256-2452 °Outpatient Psychiatry and Counseling ° °[facility]: Mobile Crisis   Management 24 hours:  1-877-626-1772 ° °[facility] sliding scale fee and walk in schedule: M-F 8am-12pm/1pm-3pm °[address]  °[place], [name] [address] °[phone] ° °[facility] °[address] °[place], [place] [address] °[phone] ° °[facility] ([facility])- new patient walk-in appointments available Monday - Friday 8am -3pm.          °[address] °[place], [place] [address] °336-676-6840 or crisis line- 336-676-6905 ° °[facility]/ Intensive Outpatient Therapy Program °[address] °[place], [place] [address] °336-832-9804 ° °[facility]                  °Crisis Services      °[phone]      °[address]     °[place], [place] [address]                ° °[facility]   °[facility] °[phone] °[address] °[place], [place] [address] ° ° °[facility]          °[name]  °[place], [place] [address]       °[phone] ° °[facility] °[address] °[place], [place] [address] °336-292-1510 ° °[facility]    °[address]    °[place], [place] [address]     °[phone]      ° °Parish McKinney, MD     °[address]     °[place] [address]     °[phone]     °  °[facility] °[address] °[place] [address] ° °[facility]     °[address]     °[place], [place]      °336-542-2076      ° °[facility] °Shamsher Ahluwalia, MD °[address] °[place], [place] [address] °[phone] ° °[facility]     °[address]     °[place], [place] [address]     °336-274-1237      ° °[facility] °[address] °[place], [place] [address] °336-854-4450 °Resources for Temporary Residential Assistance/Crisis Centers ° °DAY CENTERS °[facility] °M-F 8am-3pm   °[address] [place], [place] [address]   336-332-0824 °Services include: laundry, barbering, support groups, case management, phone  & computer access, showers, AA/NA mtgs, mental health/substance abuse nurse, job skills class, disability information, VA assistance, spiritual classes, etc.  ° °HOMELESS SHELTERS ° °[facility]     °[facility]   °[address], [place]     °[phone]       °       °[facility] (women and children)       °[address] °[place], [place] [address] °336-275-0820 °[email or web address] for application and process °Application Required ° °[facility]   °[address]    °[place] [address]     °[phone]       °             °[facility] °[address] °, [place] [address] °336.273.5572 °336-235-0363(schedule application appt.) °Application Required ° °[facility] (women only)    °[address]     °[place], [place] [address]     °336-884-1039      °  Intake starts 6pm daily °Need valid ID, SSC, & Police report °[facility] [place] °[address] °[place], [place] °336-881-5420 °Application Required ° °[facility] (men only)     °[address]      °[place], [place]     °[phone]      ° °[facility] °(Pregnant women only) °[address] °[place], [place] °336-275-0206 ° °[facility]      °[address]      °[place], [place] [address]     °[phone]      °       °[facility] [place] [facility] °[address] °[place], [place] °336-723-1848 °90 day commitment/SA/Application process ° °[facility](men only)     °[address]     °[place], [place]     °[phone]       °Check-in at 7pm     °       °[facility] °[address] °[place], [place] [address] °336-248-6684 °Men/Women/Women and Children must be there by 7 pm ° °[facility] °[place],  °336-722-8721                ° °

## 2015-10-19 NOTE — ED Provider Notes (Signed)
[place] DEPT Provider Note   CSN: [id number] Arrival date & time: 10/19/15  [id number]     History   Chief Complaint Chief Complaint  Patient presents with  . Shortness of Breath  . Chest Pain    HPI [name] [name] is a 45 y.o. male.  Patient presents to the ED with a chief complaint of chest tightness and shortness of breath.  He states that he has had the symptoms for the past several weeks.  He states that he became anxious and felt like he was going to pass out a few days ago when dealing with an insurance issue with his daughter's dentist.  He states that he has been under a lot of stress at work.  He reports that he has not been sleeping well and has been self-medicating with alcohol.  He states that he has to drink about 4 beers to get any sleep.  He denies any history of ACS or PE.  He reports having sleep apnea, but cannot tolerate a CPAP.  He states that he did have some leg swelling months ago when he was on his feet a lot, but states that this has resolved now that he rests more at work.   The history is provided by the patient. No language interpreter was used.    Past Medical History:  Diagnosis Date  . Asthma   . Chronic bronchitis ([place])   . GERD (gastroesophageal reflux disease)   . Hypertension   . Obesity   . Sleep apnea    "suppose to wear mask but I don't" (05/31/2015)    Patient Active Problem List   Diagnosis Date Noted  . Alternating constipation and diarrhea 10/12/2015  . Encounter for completion of form with patient 10/12/2015  . SBO (small bowel obstruction) ([place]) 05/31/2015  . Hepatic steatosis 05/31/2015  . Calculus of gallbladder with acute cholecystitis without obstruction 02/15/2015  . Cholelithiasis 02/13/2015  . Gallstone 02/10/2015  . Chest wall pain 02/10/2015  . Essential hypertension 07/31/2014  . Right knee pain 12/01/2012  . Skin tag 05/28/2012  . Conjunctivitis 10/18/2010  . GASTROENTERITIS WITHOUT DEHYDRATION 05/15/2010  .  ANXIETY 11/14/2009  . DEPENDENT EDEMA 11/09/2008  . ANGER 10/31/2006  . COLONIC POLYPS 10/21/2006  . ADHD (attention deficit hyperactivity disorder) 10/21/2006  . GERD 10/21/2006  . Morbid obesity ([place]) 07/30/2006  . HYPERTENSION, BENIGN ESSENTIAL 07/30/2006  . OSA (obstructive sleep apnea) 07/30/2006  . Hemorrhoids 10/03/2005    Past Surgical History:  Procedure Laterality Date  . CHOLECYSTECTOMY N/A 02/14/2015   Procedure: LAPAROSCOPIC CHOLECYSTECTOMY WITH INTRAOPERATIVE CHOLANGIOGRAM;  Surgeon: Coralie Keens, MD;  Location: [facility];  Service: General;  Laterality: N/A;  . COLONOSCOPY  10/16/2005   Polyps (tubular adenoma 2007), external hemorrhoids  . LAPAROSCOPIC GASTRIC SLEEVE RESECTION  10/2010    at [facility] Medications    Prior to Admission medications   Medication Sig Start Date End Date Taking? Authorizing Provider  albuterol (PROVENTIL HFA;VENTOLIN HFA) 108 (90 Base) MCG/ACT inhaler Inhale 2 puffs into the lungs every 6 (six) hours as needed for wheezing or shortness of breath. 05/11/15   [name], NP  amLODipine (NORVASC) 5 MG tablet Take 1 tablet (5 mg total) by mouth daily. 10/06/14   Lucille Passy, MD  omeprazole (PRILOSEC) 20 MG capsule TAKE ONE CAPSULE BY MOUTH ONCE DAILY 07/29/15   [name], NP    Family History Family History  Problem Relation Age of Onset  .  Diabetes Mellitus II Mother   . Breast cancer Mother   . Diabetes Mellitus II Father   . CAD Father   . Stroke Father   . Cancer Father   . Cirrhosis Maternal Grandmother     Social History Social History  Substance Use Topics  . Smoking status: Former Smoker    Packs/day: 3.00    Years: 0.00    Types: Cigarettes    Quit date: 01/13/2008  . Smokeless tobacco: Current User    Types: Chew, Snuff  . Alcohol use 7.8 oz/week    13 Shots of liquor per week     Comment: 05/31/2015 "1 pint of liquor/wk"     Allergies   Bee venom; Codeine; Hornet venom; and  Penicillins   Review of Systems Review of Systems  Respiratory: Positive for chest tightness.   Psychiatric/Behavioral: Positive for sleep disturbance. The patient is nervous/anxious.   All other systems reviewed and are negative.    Physical Exam Updated Vital Signs BP 137/82   Pulse 81   Temp 98 F (36.7 C) (Oral)   Resp 18   Ht 6\' 2"  (1.88 m)   Wt (!) 199.6 kg   SpO2 97%   BMI 56.49 kg/m   Physical Exam  Constitutional: He is oriented to person, place, and time. He appears well-developed and well-nourished.  Morbidly obese  HENT:  Head: Normocephalic and atraumatic.  Eyes: Conjunctivae and EOM are normal. Pupils are equal, round, and reactive to light. Right eye exhibits no discharge. Left eye exhibits no discharge. No scleral icterus.  Neck: Normal range of motion. Neck supple. No JVD present.  Cardiovascular: Normal rate, regular rhythm and normal heart sounds.  Exam reveals no gallop and no friction rub.   No murmur heard. Pulmonary/Chest: Effort normal and breath sounds normal. No respiratory distress. He has no wheezes. He has no rales. He exhibits no tenderness.  Abdominal: Soft. He exhibits no distension and no mass. There is no tenderness. There is no rebound and no guarding.  Musculoskeletal: Normal range of motion. He exhibits no edema or tenderness.  Neurological: He is alert and oriented to person, place, and time.  Skin: Skin is warm and dry.  Psychiatric: He has a normal mood and affect. His behavior is normal. Judgment and thought content normal.  Nursing note and vitals reviewed.    ED Treatments / Results  Labs (all labs ordered are listed, but only abnormal results are displayed) Labs Reviewed  BASIC METABOLIC PANEL - Abnormal; Notable for the following:       Result Value   Glucose, Bld 104 (*)    All other components within normal limits  CBC  I-STAT TROPOININ, ED    EKG  EKG Interpretation  Date/Time:  Wednesday October 19 2015 04:31:50  EDT Ventricular Rate:  75 PR Interval:  200 QRS Duration: 98 QT Interval:  402 QTC Calculation: 448 R Axis:   0 Text Interpretation:  Normal sinus rhythm Incomplete right bundle branch block Borderline ECG Confirmed by Wilson Singer  MD, [place] ([id number]) on 10/19/2015 7:59:09 AM       Radiology Dg Chest 2 View  Result Date: 10/19/2015 CLINICAL DATA:  Patient woke up this morning with shortness of breath and chest discomfort. EXAM: CHEST  2 VIEW COMPARISON:  02/16/2015 FINDINGS: The heart size and mediastinal contours are within normal limits. Both lungs are clear. The visualized skeletal structures are unremarkable. IMPRESSION: No active cardiopulmonary disease. Electronically Signed   By: Oren Beckmann.D.  On: 10/19/2015 05:01    Procedures Procedures (including critical care time)  Medications Ordered in ED Medications - No data to display   Initial Impression / Assessment and Plan / ED Course  I have reviewed the triage vital signs and the nursing notes.  Pertinent labs & imaging results that were available during my care of the patient were reviewed by me and considered in my medical decision making (see chart for details).  Clinical Course    Patient with chest tightness and SOB for weeks. He seems to be under an immense amount of stress and I suspect this is a contributing factor.  He doesn't have any acute ischemic changes on EKG, and is similar to prior in December.  Discussed and reviewed EKGs with Dr. Wilson Singer. CXR and trop are negative.  He is not hypoxic or tachycardic.  I doubt PE.  I think that he would benefit from seeing a behavioral specialist and sleep medicine specialist.   He understands and agrees with the plan.  Will discharge with PCP follow-up.  Will provide resources for specialist follow-up.  He does not appear to be in acute distress.  He is stable for discharge.  Final Clinical Impressions(s) / ED Diagnoses   Final diagnoses:  Stress  Chest tightness   Difficulty sleeping    New Prescriptions New Prescriptions   No medications on file         Montine Circle, PA-C 10/19/15 [id number]    Virgel Manifold, MD 10/23/15 [id number]

## 2015-10-19 NOTE — ED Triage Notes (Signed)
Pt states he woke up this morning with some SOB and chest discomfort; Pt reports SOB and chest discomfort has been being on going for a couple of weeks; Pt is speaking in full sentences at triage; Pt denies n/v; Pt a&ox 4 on arrival.

## 2015-11-04 ENCOUNTER — Other Ambulatory Visit: Payer: Self-pay | Admitting: Family Medicine

## 2015-12-04 ENCOUNTER — Other Ambulatory Visit: Payer: Self-pay | Admitting: Family Medicine

## 2015-12-08 ENCOUNTER — Telehealth: Payer: Self-pay

## 2015-12-08 MED ORDER — AMLODIPINE BESYLATE 5 MG PO TABS
5.0000 mg | ORAL_TABLET | Freq: Every day | ORAL | 0 refills | Status: DC
Start: 2015-12-08 — End: 2015-12-21

## 2015-12-08 NOTE — Telephone Encounter (Signed)
Pt request refill amlodipine to [facility]. Pt has appt scheduled with Dr Deborra Medina on 12/21/15. Pt last f/u appt on 02/10/15. Refilled # 30 until seen; pt out of amlodipine. Pt voiced understanding and will ck with pharmacy.

## 2015-12-13 ENCOUNTER — Encounter (HOSPITAL_COMMUNITY): Payer: Self-pay

## 2015-12-13 ENCOUNTER — Inpatient Hospital Stay (HOSPITAL_COMMUNITY)
Admission: EM | Admit: 2015-12-13 | Discharge: 2015-12-16 | DRG: 392 | Disposition: A | Discharge status: Home / Self Care | Payer: BC Managed Care – PPO | Attending: Internal Medicine | Admitting: Internal Medicine

## 2015-12-13 ENCOUNTER — Emergency Department (HOSPITAL_COMMUNITY): Payer: BC Managed Care – PPO

## 2015-12-13 DIAGNOSIS — Z9884 Bariatric surgery status: Secondary | ICD-10-CM | POA: Diagnosis not present

## 2015-12-13 DIAGNOSIS — K219 Gastro-esophageal reflux disease without esophagitis: Secondary | ICD-10-CM | POA: Diagnosis present

## 2015-12-13 DIAGNOSIS — Z880 Allergy status to penicillin: Secondary | ICD-10-CM

## 2015-12-13 DIAGNOSIS — K529 Noninfective gastroenteritis and colitis, unspecified: Secondary | ICD-10-CM | POA: Diagnosis present

## 2015-12-13 DIAGNOSIS — Z91030 Bee allergy status: Secondary | ICD-10-CM

## 2015-12-13 DIAGNOSIS — Z8249 Family history of ischemic heart disease and other diseases of the circulatory system: Secondary | ICD-10-CM

## 2015-12-13 DIAGNOSIS — R590 Localized enlarged lymph nodes: Secondary | ICD-10-CM | POA: Diagnosis present

## 2015-12-13 DIAGNOSIS — I10 Essential (primary) hypertension: Secondary | ICD-10-CM | POA: Diagnosis present

## 2015-12-13 DIAGNOSIS — Z823 Family history of stroke: Secondary | ICD-10-CM

## 2015-12-13 DIAGNOSIS — K56609 Unspecified intestinal obstruction, unspecified as to partial versus complete obstruction: Secondary | ICD-10-CM

## 2015-12-13 DIAGNOSIS — Z885 Allergy status to narcotic agent status: Secondary | ICD-10-CM | POA: Diagnosis not present

## 2015-12-13 DIAGNOSIS — Z79899 Other long term (current) drug therapy: Secondary | ICD-10-CM

## 2015-12-13 DIAGNOSIS — Z720 Tobacco use: Secondary | ICD-10-CM

## 2015-12-13 DIAGNOSIS — Z9049 Acquired absence of other specified parts of digestive tract: Secondary | ICD-10-CM | POA: Diagnosis not present

## 2015-12-13 DIAGNOSIS — R1084 Generalized abdominal pain: Secondary | ICD-10-CM | POA: Diagnosis not present

## 2015-12-13 DIAGNOSIS — Z0189 Encounter for other specified special examinations: Secondary | ICD-10-CM

## 2015-12-13 DIAGNOSIS — K56600 Partial intestinal obstruction, unspecified as to cause: Secondary | ICD-10-CM | POA: Diagnosis present

## 2015-12-13 DIAGNOSIS — G4733 Obstructive sleep apnea (adult) (pediatric): Secondary | ICD-10-CM | POA: Diagnosis present

## 2015-12-13 DIAGNOSIS — R109 Unspecified abdominal pain: Secondary | ICD-10-CM

## 2015-12-13 DIAGNOSIS — Z833 Family history of diabetes mellitus: Secondary | ICD-10-CM | POA: Diagnosis not present

## 2015-12-13 HISTORY — DX: Partial intestinal obstruction, unspecified as to cause: K56.600

## 2015-12-13 LAB — COMPREHENSIVE METABOLIC PANEL
ALK PHOS: 55 U/L (ref 38–126)
ALT: 45 U/L (ref 17–63)
ANION GAP: 8 (ref 5–15)
AST: 28 U/L (ref 15–41)
Albumin: 4.4 g/dL (ref 3.5–5.0)
BILIRUBIN TOTAL: 0.7 mg/dL (ref 0.3–1.2)
BUN: 9 mg/dL (ref 6–20)
CALCIUM: 9.6 mg/dL (ref 8.9–10.3)
CO2: 24 mmol/L (ref 22–32)
Chloride: 106 mmol/L (ref 101–111)
Creatinine, Ser: 1.1 mg/dL (ref 0.61–1.24)
GFR calc Af Amer: >60 mL/min (ref >60)
Glucose, Bld: 100 mg/dL — ABNORMAL HIGH (ref 65–99)
POTASSIUM: 3.9 mmol/L (ref 3.5–5.1)
Sodium: 138 mmol/L (ref 135–145)
TOTAL PROTEIN: 7.6 g/dL (ref 6.5–8.1)

## 2015-12-13 LAB — URINALYSIS, ROUTINE W REFLEX MICROSCOPIC
Glucose, UA: NEGATIVE mg/dL
Hgb urine dipstick: NEGATIVE
KETONES UR: 15 mg/dL — AB
LEUKOCYTES UA: NEGATIVE
NITRITE: NEGATIVE
PH: 6 (ref 5.0–8.0)
PROTEIN: NEGATIVE mg/dL
Specific Gravity, Urine: 1.015 (ref 1.005–1.030)

## 2015-12-13 LAB — CBC
HEMATOCRIT: 46 % (ref 39.0–52.0)
Hemoglobin: 16.3 g/dL (ref 13.0–17.0)
MCH: 30.1 pg (ref 26.0–34.0)
MCHC: 35.4 g/dL (ref 30.0–36.0)
MCV: 85 fL (ref 78.0–100.0)
Platelets: 186 10*3/uL (ref 150–400)
RBC: 5.41 MIL/uL (ref 4.22–5.81)
RDW: 12.8 % (ref 11.5–15.5)
WBC: 5.2 10*3/uL (ref 4.0–10.5)

## 2015-12-13 LAB — LIPASE, BLOOD: Lipase: 23 U/L (ref 11–51)

## 2015-12-13 MED ORDER — OXYCODONE-ACETAMINOPHEN 5-325 MG PO TABS
2.0000 | ORAL_TABLET | Freq: Once | ORAL | Status: AC
  Administered 2015-12-13: 2 via ORAL
  Filled 2015-12-13: qty 2

## 2015-12-13 MED ORDER — IOPAMIDOL (ISOVUE-300) INJECTION 61%
100.0000 mL | Freq: Once | INTRAVENOUS | Status: AC | PRN
  Administered 2015-12-13: 100 mL via INTRAVENOUS

## 2015-12-13 NOTE — ED Provider Notes (Signed)
[facility] [place] DEPT Provider Note   CSN: [id number] Arrival date & time: 12/13/15  1229     History   Chief Complaint Chief Complaint  Patient presents with  . Abdominal Pain  . Constipation    HPI [name] [name] is a 45 y.o. male.   Abdominal Pain   This is a new problem. Episode onset: 1 week. The problem occurs constantly. The problem has been gradually worsening. The pain is associated with a previous surgery. The pain is located in the generalized abdominal region. The quality of the pain is cramping and sharp. The pain is moderate. Associated symptoms include anorexia, nausea and constipation. Pertinent negatives include fever, diarrhea, flatus, vomiting, dysuria, hematuria and arthralgias.    Past Medical History:  Diagnosis Date  . Asthma   . Chronic bronchitis ([place])   . GERD (gastroesophageal reflux disease)   . Hypertension   . Obesity   . Sleep apnea    "suppose to wear mask but I don't" (05/31/2015)    Patient Active Problem List   Diagnosis Date Noted  . Alternating constipation and diarrhea 10/12/2015  . Encounter for completion of form with patient 10/12/2015  . SBO (small bowel obstruction) 05/31/2015  . Hepatic steatosis 05/31/2015  . Calculus of gallbladder with acute cholecystitis without obstruction 02/15/2015  . Cholelithiasis 02/13/2015  . Gallstone 02/10/2015  . Chest wall pain 02/10/2015  . Essential hypertension 07/31/2014  . Right knee pain 12/01/2012  . Skin tag 05/28/2012  . Conjunctivitis 10/18/2010  . GASTROENTERITIS WITHOUT DEHYDRATION 05/15/2010  . ANXIETY 11/14/2009  . DEPENDENT EDEMA 11/09/2008  . ANGER 10/31/2006  . COLONIC POLYPS 10/21/2006  . ADHD (attention deficit hyperactivity disorder) 10/21/2006  . GERD 10/21/2006  . Morbid obesity ([place]) 07/30/2006  . HYPERTENSION, BENIGN ESSENTIAL 07/30/2006  . OSA (obstructive sleep apnea) 07/30/2006  . Hemorrhoids 10/03/2005    Past Surgical History:  Procedure Laterality  Date  . CHOLECYSTECTOMY N/A 02/14/2015   Procedure: LAPAROSCOPIC CHOLECYSTECTOMY WITH INTRAOPERATIVE CHOLANGIOGRAM;  Surgeon: Coralie Keens, MD;  Location: [facility];  Service: General;  Laterality: N/A;  . COLONOSCOPY  10/16/2005   Polyps (tubular adenoma 2007), external hemorrhoids  . LAPAROSCOPIC GASTRIC SLEEVE RESECTION  10/2010    at [facility] Medications    Prior to Admission medications   Medication Sig Start Date End Date Taking? Authorizing Provider  albuterol (PROVENTIL HFA;VENTOLIN HFA) 108 (90 Base) MCG/ACT inhaler Inhale 2 puffs into the lungs every 6 (six) hours as needed for wheezing or shortness of breath. 05/11/15   [name], NP  amLODipine (NORVASC) 5 MG tablet Take 1 tablet (5 mg total) by mouth daily. OFFICE VISIT REQUIRED FOR ADDITIONAL REFILLS 12/08/15   Lucille Passy, MD  omeprazole (PRILOSEC) 20 MG capsule TAKE ONE CAPSULE BY MOUTH ONCE DAILY Patient taking differently: TAKE 20 MG BY MOUTH ONCE DAILY 07/29/15   [name], NP    Family History Family History  Problem Relation Age of Onset  . Diabetes Mellitus II Mother   . Breast cancer Mother   . Diabetes Mellitus II Father   . CAD Father   . Stroke Father   . Cancer Father   . Cirrhosis Maternal Grandmother     Social History Social History  Substance Use Topics  . Smoking status: Former Smoker    Packs/day: 3.00    Years: 0.00    Types: Cigarettes    Quit date: 01/13/2008  . Smokeless tobacco: Current User  Types: Chew, Snuff  . Alcohol use 7.8 oz/week    13 Shots of liquor per week     Comment: 05/31/2015 "1 pint of liquor/wk"     Allergies   Bee venom; Codeine; Hornet venom; and Penicillins   Review of Systems Review of Systems  Constitutional: Negative for chills and fever.  HENT: Negative for ear pain and sore throat.   Eyes: Negative for pain and visual disturbance.  Respiratory: Negative for cough and shortness of breath.   Cardiovascular: Negative for chest  pain and palpitations.  Gastrointestinal: Positive for abdominal pain, anorexia, constipation and nausea. Negative for diarrhea, flatus and vomiting.  Genitourinary: Negative for dysuria and hematuria.  Musculoskeletal: Negative for arthralgias and back pain.  Skin: Negative for color change and rash.  Neurological: Negative for seizures and syncope.  All other systems reviewed and are negative.    Physical Exam Updated Vital Signs BP 144/91 (BP Location: Left Arm)   Pulse 73   Temp 97.8 F (36.6 C) (Oral)   Resp 18   Ht 6\' 2"  (1.88 m)   Wt (!) 180.5 kg   SpO2 98%   BMI 51.10 kg/m   Physical Exam  Constitutional: He is oriented to person, place, and time. He appears well-developed and well-nourished.  HENT:  Head: Normocephalic and atraumatic.  Eyes: Conjunctivae and EOM are normal. Pupils are equal, round, and reactive to light.  Neck: Normal range of motion. Neck supple.  Cardiovascular: Normal rate and regular rhythm.   Pulmonary/Chest: Effort normal and breath sounds normal.  Abdominal: Soft. There is tenderness. Guarding: generalized.  Musculoskeletal: He exhibits no edema.  Neurological: He is alert and oriented to person, place, and time.  Skin: Skin is warm and dry.  Psychiatric: He has a normal mood and affect.  Nursing note and vitals reviewed.    ED Treatments / Results  Labs (all labs ordered are listed, but only abnormal results are displayed) Labs Reviewed  COMPREHENSIVE METABOLIC PANEL - Abnormal; Notable for the following:       Result Value   Glucose, Bld 100 (*)    All other components within normal limits  URINALYSIS, ROUTINE W REFLEX MICROSCOPIC (NOT AT [facility]) - Abnormal; Notable for the following:    Color, Urine AMBER (*)    Bilirubin Urine SMALL (*)    Ketones, ur 15 (*)    All other components within normal limits  BASIC METABOLIC PANEL - Abnormal; Notable for the following:    Glucose, Bld 102 (*)    Calcium 8.8 (*)    All other  components within normal limits  GLUCOSE, CAPILLARY - Abnormal; Notable for the following:    Glucose-Capillary 116 (*)    All other components within normal limits  LIPASE, BLOOD  CBC  CBC    EKG  EKG Interpretation None       Radiology Ct Abdomen Pelvis W Contrast  Result Date: 12/13/2015 CLINICAL DATA:  Initial evaluation for acute abdominal pain with distention and nausea for 1 week. Concern for obstruction. EXAM: CT ABDOMEN AND PELVIS WITH CONTRAST TECHNIQUE: Multidetector CT imaging of the abdomen and pelvis was performed using the standard protocol following bolus administration of intravenous contrast. CONTRAST:  174mL ISOVUE-300 IOPAMIDOL (ISOVUE-300) INJECTION 61% COMPARISON:  Prior CT from 05/31/2015. FINDINGS: Lower chest: Visualized lung bases are clear. No pleural or pericardial fusion. Hepatobiliary: Diffuse hypoattenuation of the liver compatible with steatosis. Liver otherwise unremarkable. Gallbladder surgically absent. No biliary dilatation. Pancreas: Pancreas within normal limits. Spleen: Spleen  within normal limits. Adrenals/Urinary Tract: Adrenal glands are normal. Kidneys equal in size with symmetric enhancement. Several cysts noted within the right kidney, largest of which measures 8 cm. No nephrolithiasis, hydronephrosis, or focal enhancing renal mass. Ureters of normal caliber without acute abnormality. Bladder partially distended and within normal limits. Stomach/Bowel: Sequela prior sleeve gastrectomy noted. Stomach otherwise unremarkable. Few mildly prominent loops of small bowel measuring up to 3.7 cm in diameter with associated air-fluid levels are present within knee mid and left abdomen. No discrete transition point identified. Findings favored to reflect possible adynamic ileus, possibly related to underlying enteritis, although a low grade partial small bowel obstruction not entirely excluded. These findings are similar but slightly less prominent as compared to  most recent CT from 05/31/2015. No significant inflammatory changes about the small bowel. Ileum is decompressed without acute abnormality. Colon is decompressed without acute inflammatory changes. Mild sigmoid diverticulosis without evidence for acute diverticulitis. The No findings to suggest acute appendicitis. Vascular/Lymphatic: Normal intravascular enhancement seen throughout the intra-abdominal aorta and its branch vessels. Similar appearance of multiple prominent sub cm mesenteric lymph nodes at the root of the mesenteries, similar to previous. Again, findings may reflect sequela of underlying enteritis. No pathologically enlarged adenopathy within the abdomen and pelvis. Reproductive: Prostate normal. Other: No free air or fluid. Small fat containing paraumbilical hernia noted. Musculoskeletal: No acute osseous abnormality. No worrisome lytic or blastic osseous lesions. IMPRESSION: 1. Few mildly prominent loops of small bowel clustered within the mid and left abdomen with a few associated air-fluid levels. No discrete transition point identified. Findings favored to reflect an adynamic ileus, possibly related to underlying enteritis, although a possible low grade partial obstruction is not entirely excluded. Overall, these findings are similar but less prominent as compared to most recent CT from 05/31/2015. 2. Similar appearance of mild mesenteric thickening at the root of the mesentery with multiple small subcentimeter lymph nodes. Findings are suspected to be reactive in nature, potentially related to chronic underlying enteritis, although chronic sclerosing mesenteritis or lymphoproliferative disorder are not entirely excluded. 3. Mild sigmoid diverticulosis without evidence for acute diverticulitis. 4. Status post sleeve gastrectomy. 5. Hepatic steatosis. Electronically Signed   By: Jeannine Boga M.D.   On: 12/13/2015 21:23    Procedures Procedures (including critical care  time)  Medications Ordered in ED Medications - No data to display   Initial Impression / Assessment and Plan / ED Course  I have reviewed the triage vital signs and the nursing notes.  Pertinent labs & imaging results that were available during my care of the patient were reviewed by me and considered in my medical decision making (see chart for details).  Clinical Course   Mr. Heinig is a 45 year old male with past medical history significant for obesity status post gastric sleeve, GERD, asthma, attention, small bowel obstruction who presents for abdominal pain and inability to pass flatus or stool.  Patient is given p.o. pain medications with improvement.  Labs obtained including CBC, CMP, lipase, UA.  Results are unremarkable.  CT abdomen/pelvis obtained, demonstrates small bowel loops with air-fluid level, but no discrete transition point.  Possible ileus versus partial bowel obstruction.  Findings are less prominent than those from March.  Also likely reactive mesenteric lymph nodes, diverticulosis, and hepatic steatosis noted.  Patient will be admitted for further evaluation and treatment of small bowel obstruction.   Final Clinical Impressions(s) / ED Diagnoses   Final diagnoses:  Small bowel obstruction, partial    New Prescriptions New  Prescriptions   No medications on file     Elveria Rising, MD 12/14/15 [place], DO 12/14/15 1447

## 2015-12-13 NOTE — ED Notes (Signed)
Marlou Sa, MD res at bedside to update patient

## 2015-12-13 NOTE — ED Notes (Signed)
Patient transported to CT via stretcher.

## 2015-12-13 NOTE — ED Notes (Signed)
Attempted report x 1; name and call back number provided 

## 2015-12-13 NOTE — ED Notes (Signed)
Pt unable to void at this time. 

## 2015-12-13 NOTE — ED Notes (Signed)
Pt  Informed of wait times.

## 2015-12-13 NOTE — ED Triage Notes (Addendum)
Pt c/o generalized abd pain, abd distention x 1 week. Reports constipation, took laxative last night and now has watery stools. Hx GI blockages. Pt states "intestines feel inflamed and similar to previous blockages." C/o nausea and belching/regurgitation symptoms. Denies vomiting. 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

## 2015-12-13 NOTE — ED Notes (Addendum)
RN encouraging patient to urinate; pt states he does not have to go at this time; pt also requesting "another percocet"

## 2015-12-14 ENCOUNTER — Encounter (HOSPITAL_COMMUNITY): Payer: Self-pay | Admitting: Internal Medicine

## 2015-12-14 DIAGNOSIS — K56600 Partial intestinal obstruction, unspecified as to cause: Secondary | ICD-10-CM | POA: Diagnosis present

## 2015-12-14 DIAGNOSIS — I10 Essential (primary) hypertension: Secondary | ICD-10-CM

## 2015-12-14 HISTORY — DX: Partial intestinal obstruction, unspecified as to cause: K56.600

## 2015-12-14 LAB — CBC
HEMATOCRIT: 42.3 % (ref 39.0–52.0)
HEMOGLOBIN: 14.8 g/dL (ref 13.0–17.0)
MCH: 29.7 pg (ref 26.0–34.0)
MCHC: 35 g/dL (ref 30.0–36.0)
MCV: 84.9 fL (ref 78.0–100.0)
Platelets: 152 10*3/uL (ref 150–400)
RBC: 4.98 MIL/uL (ref 4.22–5.81)
RDW: 12.8 % (ref 11.5–15.5)
WBC: 4.8 10*3/uL (ref 4.0–10.5)

## 2015-12-14 LAB — BASIC METABOLIC PANEL
ANION GAP: 8 (ref 5–15)
BUN: 10 mg/dL (ref 6–20)
CO2: 26 mmol/L (ref 22–32)
Calcium: 8.8 mg/dL — ABNORMAL LOW (ref 8.9–10.3)
Chloride: 103 mmol/L (ref 101–111)
Creatinine, Ser: 0.9 mg/dL (ref 0.61–1.24)
GFR calc Af Amer: >60 mL/min (ref >60)
GLUCOSE: 102 mg/dL — AB (ref 65–99)
POTASSIUM: 3.5 mmol/L (ref 3.5–5.1)
Sodium: 137 mmol/L (ref 135–145)

## 2015-12-14 LAB — GLUCOSE, CAPILLARY
GLUCOSE-CAPILLARY: 116 mg/dL — AB (ref 65–99)
GLUCOSE-CAPILLARY: 100 mg/dL — AB (ref 65–99)
GLUCOSE-CAPILLARY: 107 mg/dL — AB (ref 65–99)

## 2015-12-14 MED ORDER — HEPARIN SODIUM (PORCINE) 5000 UNIT/ML IJ SOLN
5000.0000 [IU] | Freq: Three times a day (TID) | INTRAMUSCULAR | Status: DC
  Administered 2015-12-14 – 2015-12-15 (×4): 5000 [IU] via SUBCUTANEOUS
  Filled 2015-12-14 (×7): qty 1

## 2015-12-14 MED ORDER — ACETAMINOPHEN 650 MG RE SUPP: 650.0000 mg | Freq: Four times a day (QID) | RECTAL | Status: DC | PRN

## 2015-12-14 MED ORDER — FENTANYL CITRATE (PF) 100 MCG/2ML IJ SOLN
25.0000 ug | INTRAMUSCULAR | Status: DC | PRN
  Administered 2015-12-14 – 2015-12-15 (×4): 50 ug via INTRAVENOUS
  Filled 2015-12-14 (×4): qty 2

## 2015-12-14 MED ORDER — ONDANSETRON HCL 4 MG/2ML IJ SOLN
4.0000 mg | Freq: Four times a day (QID) | INTRAMUSCULAR | Status: DC | PRN
  Administered 2015-12-14 – 2015-12-15 (×2): 4 mg via INTRAVENOUS
  Filled 2015-12-14 (×2): qty 2

## 2015-12-14 MED ORDER — FAMOTIDINE IN NACL 20-0.9 MG/50ML-% IV SOLN
20.0000 mg | Freq: Two times a day (BID) | INTRAVENOUS | Status: DC
  Administered 2015-12-14 – 2015-12-16 (×5): 20 mg via INTRAVENOUS
  Filled 2015-12-14 (×6): qty 50

## 2015-12-14 MED ORDER — ALBUTEROL SULFATE (2.5 MG/3ML) 0.083% IN NEBU: 3.0000 mL | INHALATION_SOLUTION | Freq: Four times a day (QID) | RESPIRATORY_TRACT | Status: DC | PRN

## 2015-12-14 MED ORDER — ONDANSETRON HCL 4 MG PO TABS: 4.0000 mg | ORAL_TABLET | Freq: Four times a day (QID) | ORAL | Status: DC | PRN

## 2015-12-14 MED ORDER — DIATRIZOATE MEGLUMINE & SODIUM 66-10 % PO SOLN
90.0000 mL | Freq: Once | ORAL | Status: DC
  Filled 2015-12-14: qty 90

## 2015-12-14 MED ORDER — HYDRALAZINE HCL 20 MG/ML IJ SOLN
10.0000 mg | INTRAMUSCULAR | Status: DC | PRN
Start: 2015-12-14 — End: 2015-12-16

## 2015-12-14 MED ORDER — ACETAMINOPHEN 325 MG PO TABS: 650.0000 mg | ORAL_TABLET | Freq: Four times a day (QID) | ORAL | Status: DC | PRN

## 2015-12-14 MED ORDER — FENTANYL CITRATE (PF) 100 MCG/2ML IJ SOLN
50.0000 ug | INTRAMUSCULAR | Status: DC | PRN
  Administered 2015-12-14: 50 ug via INTRAVENOUS
  Filled 2015-12-14: qty 2

## 2015-12-14 MED ORDER — DEXTROSE-NACL 5-0.9 % IV SOLN
INTRAVENOUS | Status: AC
  Administered 2015-12-14: 02:00:00 via INTRAVENOUS
  Administered 2015-12-14: 1000 mL via INTRAVENOUS
  Administered 2015-12-14: 21:00:00 via INTRAVENOUS

## 2015-12-14 NOTE — H&P (Signed)
History and Physical    [name] [name] [id number] DOB: 06/07/1970 DOA: 12/13/2015  PCP: [name], MD  Patient coming from: Home  Chief Complaint: Abdominal pain  HPI: [name] [name] is a 45 y.o. male with history of gastric sleeve surgery who has been previously admitted for bowel obstruction, last one in march of this year, presents with abdominal pain and nausea. Patient has been having these symptoms for the last week with gradually declining bowel movements. His last bowel movement was yesterday afternoon. Pain is mostly periumbilical and in LLQ. Not related to food or bowel movement. In the ER, CT scan shows features concerning for ileus vs partial small bowel obstruction vs chronic enteritis. There was also some nonspecific abdominal lymphadenopathy. Patient has not had any vomiting. Patient will be admitted for further management of possible small bowel obstruction.  ED Course: Patient received IV fluids and pain medications and had a CT scan as described above.  Review of Systems: As per HPI, rest all negative.   Past Medical History:  Diagnosis Date  . Asthma   . Chronic bronchitis ([place])   . GERD (gastroesophageal reflux disease)   . Hypertension   . Obesity   . Sleep apnea    "suppose to wear mask but I don't" (05/31/2015)    Past Surgical History:  Procedure Laterality Date  . CHOLECYSTECTOMY N/A 02/14/2015   Procedure: LAPAROSCOPIC CHOLECYSTECTOMY WITH INTRAOPERATIVE CHOLANGIOGRAM;  Surgeon: Coralie Keens, MD;  Location: [facility];  Service: General;  Laterality: N/A;  . COLONOSCOPY  10/16/2005   Polyps (tubular adenoma 2007), external hemorrhoids  . LAPAROSCOPIC GASTRIC SLEEVE RESECTION  10/2010    at [facility]     reports that he quit smoking about 7 years ago. His smoking use included Cigarettes. He smoked 3.00 packs per day for 0.00 years. His smokeless tobacco use includes Chew and Snuff. He reports that he drinks about 7.8 oz of alcohol per week . He reports  that he does not use drugs.  Allergies  Allergen Reactions  . Bee Venom Anaphylaxis  . Codeine Anaphylaxis    Tolerates dilaudid  . Hornet Venom Anaphylaxis  . Penicillins Hives and Other (See Comments)    Has patient had a PCN reaction causing immediate rash, facial/tongue/throat swelling, SOB or lightheadedness with hypotensionYES Has patient had a PCN reaction causing severe rash involving mucus membranes or skin necrosis: NO Has patient had a PCN reaction that required hospitalization NO Has patient had a PCN reaction occurring within the last 10 years: NO If all of the above answers are "NO", then may proceed with Cephalosporin use.    Family History  Problem Relation Age of Onset  . Diabetes Mellitus II Mother   . Breast cancer Mother   . Diabetes Mellitus II Father   . CAD Father   . Stroke Father   . Cancer Father   . Cirrhosis Maternal Grandmother     Prior to Admission medications   Medication Sig Start Date End Date Taking? Authorizing Provider  albuterol (ACCUNEB) 0.63 MG/3ML nebulizer solution Take 1 ampule by nebulization every 6 (six) hours as needed for wheezing or shortness of breath.   Yes Historical Provider, MD  albuterol (PROVENTIL HFA;VENTOLIN HFA) 108 (90 Base) MCG/ACT inhaler Inhale 2 puffs into the lungs every 6 (six) hours as needed for wheezing or shortness of breath. 05/11/15  Yes [name], NP  amLODipine (NORVASC) 5 MG tablet Take 1 tablet (5 mg total) by mouth daily. OFFICE VISIT REQUIRED FOR ADDITIONAL REFILLS  12/08/15  Yes Lucille Passy, MD  omeprazole (PRILOSEC) 20 MG capsule TAKE ONE CAPSULE BY MOUTH ONCE DAILY Patient taking differently: TAKE 20 MG BY MOUTH ONCE DAILY 07/29/15  Yes [name], NP    Physical Exam: Vitals:   12/13/15 2315 12/13/15 2330 12/13/15 2345 12/14/15 0020  BP: 149/89 147/72 131/82 (!) 143/89  Pulse: 64 65 62 62  Resp:    18  Temp:    97.6 F (36.4 C)  TempSrc:    Oral  SpO2: 96% 98% 94% 97%  Weight:     (!) 180.7 kg (398 lb 4.8 oz)  Height:    6\' 2"  (1.88 m)      Constitutional:  Vitals:   12/13/15 2315 12/13/15 2330 12/13/15 2345 12/14/15 0020  BP: 149/89 147/72 131/82 (!) 143/89  Pulse: 64 65 62 62  Resp:    18  Temp:    97.6 F (36.4 C)  TempSrc:    Oral  SpO2: 96% 98% 94% 97%  Weight:    (!) 180.7 kg (398 lb 4.8 oz)  Height:    6\' 2"  (1.88 m)   Constitutional: Appears calm and comfortable, morbidly obese and not in distress. Eyes: Anicteric, no pallor  ENMT: No discharge from the ears, eyes, nose, and mouth  Neck: No JVD, no neck rigidity Respiratory: CTA, no wheezes or rhonchi Cardiovascular: No murmurs, rubs, or gallops  Abdomen: Soft, non tender. Bowel sounds present. No rigidity. Musculoskeletal: good ROM, moves all extremities  Skin: No rashes, lesions, or ulcers   Neurological: CN 2-12 grossly intact Psychiatric: Judgement and insight intact, oriented x3.   Labs on Admission: I have personally reviewed following labs and imaging studies  CBC:  Recent Labs Lab 12/13/15 1245  WBC 5.2  HGB 16.3  HCT 46.0  MCV 85.0  PLT 99991111   Basic Metabolic Panel:  Recent Labs Lab 12/13/15 1245  NA 138  K 3.9  CL 106  CO2 24  GLUCOSE 100*  BUN 9  CREATININE 1.10  CALCIUM 9.6   GFR: Estimated Creatinine Clearance: 148.9 mL/min (by C-G formula based on SCr of 1.1 mg/dL). Liver Function Tests:  Recent Labs Lab 12/13/15 1245  AST 28  ALT 45  ALKPHOS 55  BILITOT 0.7  PROT 7.6  ALBUMIN 4.4    Recent Labs Lab 12/13/15 1245  LIPASE 23   No results for input(s): AMMONIA in the last 168 hours. Coagulation Profile: No results for input(s): INR, PROTIME in the last 168 hours. Cardiac Enzymes: No results for input(s): CKTOTAL, CKMB, CKMBINDEX, TROPONINI in the last 168 hours. BNP (last 3 results) No results for input(s): PROBNP in the last 8760 hours. HbA1C: No results for input(s): HGBA1C in the last 72 hours. CBG: No results for input(s): GLUCAP in  the last 168 hours. Lipid Profile: No results for input(s): CHOL, HDL, LDLCALC, TRIG, CHOLHDL, LDLDIRECT in the last 72 hours. Thyroid Function Tests: No results for input(s): TSH, T4TOTAL, FREET4, T3FREE, THYROIDAB in the last 72 hours. Anemia Panel: No results for input(s): VITAMINB12, FOLATE, FERRITIN, TIBC, IRON, RETICCTPCT in the last 72 hours. Urine analysis:    Component Value Date/Time   COLORURINE AMBER (A) 12/13/2015 2125   APPEARANCEUR CLEAR 12/13/2015 2125   LABSPEC 1.015 12/13/2015 2125   PHURINE 6.0 12/13/2015 2125   GLUCOSEU NEGATIVE 12/13/2015 2125   HGBUR NEGATIVE 12/13/2015 2125   BILIRUBINUR SMALL (A) 12/13/2015 2125   KETONESUR 15 (A) 12/13/2015 2125   PROTEINUR NEGATIVE 12/13/2015 2125  UROBILINOGEN 1.0 05/18/2008 1235   NITRITE NEGATIVE 12/13/2015 2125   LEUKOCYTESUR NEGATIVE 12/13/2015 2125   Sepsis Labs: @LABRCNTIP (procalcitonin:4,lacticidven:4) )No results found for this or any previous visit (from the past 240 hour(s)).   Radiological Exams on Admission: Ct Abdomen Pelvis W Contrast  Result Date: 12/13/2015 CLINICAL DATA:  Initial evaluation for acute abdominal pain with distention and nausea for 1 week. Concern for obstruction. EXAM: CT ABDOMEN AND PELVIS WITH CONTRAST TECHNIQUE: Multidetector CT imaging of the abdomen and pelvis was performed using the standard protocol following bolus administration of intravenous contrast. CONTRAST:  122mL ISOVUE-300 IOPAMIDOL (ISOVUE-300) INJECTION 61% COMPARISON:  Prior CT from 05/31/2015. FINDINGS: Lower chest: Visualized lung bases are clear. No pleural or pericardial fusion. Hepatobiliary: Diffuse hypoattenuation of the liver compatible with steatosis. Liver otherwise unremarkable. Gallbladder surgically absent. No biliary dilatation. Pancreas: Pancreas within normal limits. Spleen: Spleen within normal limits. Adrenals/Urinary Tract: Adrenal glands are normal. Kidneys equal in size with symmetric enhancement.  Several cysts noted within the right kidney, largest of which measures 8 cm. No nephrolithiasis, hydronephrosis, or focal enhancing renal mass. Ureters of normal caliber without acute abnormality. Bladder partially distended and within normal limits. Stomach/Bowel: Sequela prior sleeve gastrectomy noted. Stomach otherwise unremarkable. Few mildly prominent loops of small bowel measuring up to 3.7 cm in diameter with associated air-fluid levels are present within knee mid and left abdomen. No discrete transition point identified. Findings favored to reflect possible adynamic ileus, possibly related to underlying enteritis, although a low grade partial small bowel obstruction not entirely excluded. These findings are similar but slightly less prominent as compared to most recent CT from 05/31/2015. No significant inflammatory changes about the small bowel. Ileum is decompressed without acute abnormality. Colon is decompressed without acute inflammatory changes. Mild sigmoid diverticulosis without evidence for acute diverticulitis. The No findings to suggest acute appendicitis. Vascular/Lymphatic: Normal intravascular enhancement seen throughout the intra-abdominal aorta and its branch vessels. Similar appearance of multiple prominent sub cm mesenteric lymph nodes at the root of the mesenteries, similar to previous. Again, findings may reflect sequela of underlying enteritis. No pathologically enlarged adenopathy within the abdomen and pelvis. Reproductive: Prostate normal. Other: No free air or fluid. Small fat containing paraumbilical hernia noted. Musculoskeletal: No acute osseous abnormality. No worrisome lytic or blastic osseous lesions. IMPRESSION: 1. Few mildly prominent loops of small bowel clustered within the mid and left abdomen with a few associated air-fluid levels. No discrete transition point identified. Findings favored to reflect an adynamic ileus, possibly related to underlying enteritis, although a  possible low grade partial obstruction is not entirely excluded. Overall, these findings are similar but less prominent as compared to most recent CT from 05/31/2015. 2. Similar appearance of mild mesenteric thickening at the root of the mesentery with multiple small subcentimeter lymph nodes. Findings are suspected to be reactive in nature, potentially related to chronic underlying enteritis, although chronic sclerosing mesenteritis or lymphoproliferative disorder are not entirely excluded. 3. Mild sigmoid diverticulosis without evidence for acute diverticulitis. 4. Status post sleeve gastrectomy. 5. Hepatic steatosis. Electronically Signed   By: Jeannine Boga M.D.   On: 12/13/2015 21:23    EKG: Independently reviewed.  Assessment/Plan Principal Problem:   Small bowel obstruction, partial Active Problems:   HYPERTENSION, BENIGN ESSENTIAL   OSA (obstructive sleep apnea)    1. Abdominal pain with nausea - Differentials include partial small bowel obstruction vs ileus vs enteritis. Patient will be kept NPO for now. Repeat abdominal xray in am. If patient vomits, will need NG  tube, but patient has refused NG tube in previous admissions. Continue IV fluids and pain medications. If patient has diarrhea, will need stool studies.D/W Dr.Byerly, General Surgeon. 2. CT scan showing nonspecific abdominal lymphadenopathy - This will need follow up as an outpatient. 3. Hypertension - Since patient is NPO, will give PRN IV hydralazine. 4. History of OSA - Nonclipiant with CPAP. 5. History of asthma - Not wheezing at this time.   DVT prophylaxis: SCDs Code Status: Full code Family Communication: No family bedside Disposition Plan: Home Consults called: General surgery to be consulted Admission status: Inpatient   Laurey Morale, MD  [facility] Pager [phone].  If 7PM-7AM, please contact night-coverage [email or web address] Password [email or web address]  12/14/2015, 1:16 AM   By signing my name  below, I, Clerance Lav, attest that this documentation has been prepared under the direction and in the presence of Gean Birchwood, MD. Electronically signed: Clerance Lav, Scribe. 12/14/15 1:16 AM

## 2015-12-14 NOTE — Progress Notes (Signed)
PROGRESS NOTE    [name] [name]  [id number] DOB: 15-Jun-1970 DOA: 12/13/2015 PCP: [name], MD    Brief Narrative: [name] [name] is a 45 y.o. male with history of gastric sleeve surgery who has been previously admitted for bowel obstruction, last one in march of this year, presents with abdominal pain and nausea. Patient has been having these symptoms for the last week with gradually declining bowel movements. His last bowel movement was yesterday afternoon. Pain is mostly periumbilical and in LLQ. Not related to food or bowel movement. In the ER, CT scan shows features concerning for ileus vs partial small bowel obstruction vs chronic enteritis. There was also some nonspecific abdominal lymphadenopathy. Patient has not had any vomiting. Patient will be admitted for further management of possible small bowel obstruction.     Assessment & Plan:   Principal Problem:   Small bowel obstruction, partial Active Problems:   HYPERTENSION, BENIGN ESSENTIAL   OSA (obstructive sleep apnea)   Partial small bowel obstruction  1-Abdominal pain with nausea - Differentials include partial small bowel obstruction vs ileus vs enteritis. Decline NG tube. Didn't want to speak with me about that.  Explain to him results of CT scan.  Plan to proceed with SBO protocol.  IV fluids, Pain medications  Surgery will speak with patient again.   2-CT scan showing nonspecific abdominal lymphadenopathy - This will need follow up as an outpatient  3-Hypertension - Since patient is NPO, will give PRN IV hydralazine.  4-History of OSA - Nonclipiant with CPAP.  5-History of asthma - Not wheezing at this time.    DVT prophylaxis: start heparin  Code Status: Full Code Family Communication: care discussed with patient.  Disposition Plan: remain inpatient for treatment SBO  Consultants:   Surgery    Procedures:   none   Antimicrobials:   none   Subjective: Still complaining  abdominal pain, mid abdomen.  He relates nausea. He has not had a good BM over a week. He has been having very small amount of watery, mushy stool.  Passing some gas.  Does not wants to speak with me about NG tube.    Objective: Vitals:   12/13/15 2330 12/13/15 2345 12/14/15 0020 12/14/15 0542  BP: 147/72 131/82 (!) 143/89 132/75  Pulse: 65 62 62 65  Resp:   18 20  Temp:   97.6 F (36.4 C) 97.6 F (36.4 C)  TempSrc:   Oral Oral  SpO2: 98% 94% 97% 97%  Weight:   (!) 180.7 kg (398 lb 4.8 oz)   Height:   6\' 2"  (1.88 m)    No intake or output data in the 24 hours ending 12/14/15 0919 Filed Weights   12/13/15 1238 12/13/15 1239 12/14/15 0020  Weight: (!) 180.8 kg (398 lb 8 oz) (!) 180.5 kg (398 lb) (!) 180.7 kg (398 lb 4.8 oz)    Examination:  General exam: Appears calm and comfortable  Respiratory system:  Respiratory effort normal. Cardiovascular system: S1 & S2 heard, RRR. No JVD, murmurs, rubs, gallops or clicks. No pedal edema. Gastrointestinal system: Abdomen is nondistended.  Central nervous system: Alert and oriented.  Extremities: Moves extewities.  Skin: No rashes, lesions or ulcers     Data Reviewed: I have personally reviewed following labs and imaging studies  CBC:  Recent Labs Lab 12/13/15 1245 12/14/15 0422  WBC 5.2 4.8  HGB 16.3 14.8  HCT 46.0 42.3  MCV 85.0 84.9  PLT 186 0000000   Basic Metabolic Panel:  Recent  Labs Lab 12/13/15 1245 12/14/15 0422  NA 138 137  K 3.9 3.5  CL 106 103  CO2 24 26  GLUCOSE 100* 102*  BUN 9 10  CREATININE 1.10 0.90  CALCIUM 9.6 8.8*   GFR: Estimated Creatinine Clearance: 182 mL/min (by C-G formula based on SCr of 0.9 mg/dL). Liver Function Tests:  Recent Labs Lab 12/13/15 1245  AST 28  ALT 45  ALKPHOS 55  BILITOT 0.7  PROT 7.6  ALBUMIN 4.4    Recent Labs Lab 12/13/15 1245  LIPASE 23   No results for input(s): AMMONIA in the last 168 hours. Coagulation Profile: No results for input(s): INR,  PROTIME in the last 168 hours. Cardiac Enzymes: No results for input(s): CKTOTAL, CKMB, CKMBINDEX, TROPONINI in the last 168 hours. BNP (last 3 results) No results for input(s): PROBNP in the last 8760 hours. HbA1C: No results for input(s): HGBA1C in the last 72 hours. CBG:  Recent Labs Lab 12/14/15 0812  GLUCAP 116*   Lipid Profile: No results for input(s): CHOL, HDL, LDLCALC, TRIG, CHOLHDL, LDLDIRECT in the last 72 hours. Thyroid Function Tests: No results for input(s): TSH, T4TOTAL, FREET4, T3FREE, THYROIDAB in the last 72 hours. Anemia Panel: No results for input(s): VITAMINB12, FOLATE, FERRITIN, TIBC, IRON, RETICCTPCT in the last 72 hours. Sepsis Labs: No results for input(s): PROCALCITON, LATICACIDVEN in the last 168 hours.  No results found for this or any previous visit (from the past 240 hour(s)).       Radiology Studies: Ct Abdomen Pelvis W Contrast  Result Date: 12/13/2015 CLINICAL DATA:  Initial evaluation for acute abdominal pain with distention and nausea for 1 week. Concern for obstruction. EXAM: CT ABDOMEN AND PELVIS WITH CONTRAST TECHNIQUE: Multidetector CT imaging of the abdomen and pelvis was performed using the standard protocol following bolus administration of intravenous contrast. CONTRAST:  126mL ISOVUE-300 IOPAMIDOL (ISOVUE-300) INJECTION 61% COMPARISON:  Prior CT from 05/31/2015. FINDINGS: Lower chest: Visualized lung bases are clear. No pleural or pericardial fusion. Hepatobiliary: Diffuse hypoattenuation of the liver compatible with steatosis. Liver otherwise unremarkable. Gallbladder surgically absent. No biliary dilatation. Pancreas: Pancreas within normal limits. Spleen: Spleen within normal limits. Adrenals/Urinary Tract: Adrenal glands are normal. Kidneys equal in size with symmetric enhancement. Several cysts noted within the right kidney, largest of which measures 8 cm. No nephrolithiasis, hydronephrosis, or focal enhancing renal mass. Ureters of  normal caliber without acute abnormality. Bladder partially distended and within normal limits. Stomach/Bowel: Sequela prior sleeve gastrectomy noted. Stomach otherwise unremarkable. Few mildly prominent loops of small bowel measuring up to 3.7 cm in diameter with associated air-fluid levels are present within knee mid and left abdomen. No discrete transition point identified. Findings favored to reflect possible adynamic ileus, possibly related to underlying enteritis, although a low grade partial small bowel obstruction not entirely excluded. These findings are similar but slightly less prominent as compared to most recent CT from 05/31/2015. No significant inflammatory changes about the small bowel. Ileum is decompressed without acute abnormality. Colon is decompressed without acute inflammatory changes. Mild sigmoid diverticulosis without evidence for acute diverticulitis. The No findings to suggest acute appendicitis. Vascular/Lymphatic: Normal intravascular enhancement seen throughout the intra-abdominal aorta and its branch vessels. Similar appearance of multiple prominent sub cm mesenteric lymph nodes at the root of the mesenteries, similar to previous. Again, findings may reflect sequela of underlying enteritis. No pathologically enlarged adenopathy within the abdomen and pelvis. Reproductive: Prostate normal. Other: No free air or fluid. Small fat containing paraumbilical hernia noted. Musculoskeletal: No acute  osseous abnormality. No worrisome lytic or blastic osseous lesions. IMPRESSION: 1. Few mildly prominent loops of small bowel clustered within the mid and left abdomen with a few associated air-fluid levels. No discrete transition point identified. Findings favored to reflect an adynamic ileus, possibly related to underlying enteritis, although a possible low grade partial obstruction is not entirely excluded. Overall, these findings are similar but less prominent as compared to most recent CT from  05/31/2015. 2. Similar appearance of mild mesenteric thickening at the root of the mesentery with multiple small subcentimeter lymph nodes. Findings are suspected to be reactive in nature, potentially related to chronic underlying enteritis, although chronic sclerosing mesenteritis or lymphoproliferative disorder are not entirely excluded. 3. Mild sigmoid diverticulosis without evidence for acute diverticulitis. 4. Status post sleeve gastrectomy. 5. Hepatic steatosis. Electronically Signed   By: Jeannine Boga M.D.   On: 12/13/2015 21:23        Scheduled Meds: . diatrizoate meglumine-sodium  90 mL Per NG tube Once   Continuous Infusions: . dextrose 5 % and 0.9% NaCl 100 mL/hr at 12/14/15 0141     LOS: 1 day    Time spent:35 minutes.     Elmarie Shiley, MD [facility] Pager [phone]  If 7PM-7AM, please contact night-coverage [email or web address] Password [email or web address] 12/14/2015, 9:19 AM

## 2015-12-14 NOTE — Progress Notes (Signed)
RN tried to place NG tube per MF order but patient refused. He wants to talk with his doctor. MD aware. No new orders at this time.

## 2015-12-14 NOTE — Consult Note (Signed)
Reason for Consult:partial small bowel obstruction Referring Physician: Debra Calabretta is an 45 y.o. male.  HPI:  Patient is a 45 year old male who is status post gastric sleeve who has previous small bowel obstructions. He periodically will require admission for symptoms of decreased output as well as decreased ability to tolerate po.  He has a history of admission to the hospital for bowel sections. He last noticed a bowel movement 2 days ago. He did have some flatus this morning. He denies fevers and chills. His main symptoms this time are abdominal pain and nausea.   Past Medical History:  Diagnosis Date  . Asthma   . Chronic bronchitis ([place])   . GERD (gastroesophageal reflux disease)   . Hypertension   . Obesity   . Sleep apnea    "suppose to wear mask but I don't" (05/31/2015)    Past Surgical History:  Procedure Laterality Date  . CHOLECYSTECTOMY N/A 02/14/2015   Procedure: LAPAROSCOPIC CHOLECYSTECTOMY WITH INTRAOPERATIVE CHOLANGIOGRAM;  Surgeon: Coralie Keens, MD;  Location: [facility];  Service: General;  Laterality: N/A;  . COLONOSCOPY  10/16/2005   Polyps (tubular adenoma 2007), external hemorrhoids  . LAPAROSCOPIC GASTRIC SLEEVE RESECTION  10/2010    at [facility] History  Problem Relation Age of Onset  . Diabetes Mellitus II Mother   . Breast cancer Mother   . Diabetes Mellitus II Father   . CAD Father   . Stroke Father   . Cancer Father   . Cirrhosis Maternal Grandmother     Social History:  reports that he quit smoking about 7 years ago. His smoking use included Cigarettes. He smoked 3.00 packs per day for 0.00 years. His smokeless tobacco use includes Chew and Snuff. He reports that he drinks about 7.8 oz of alcohol per week . He reports that he does not use drugs.  Allergies:  Allergies  Allergen Reactions  . Bee Venom Anaphylaxis  . Codeine Anaphylaxis    Tolerates dilaudid  . Hornet Venom Anaphylaxis  . Penicillins Hives and Other (See  Comments)    Has patient had a PCN reaction causing immediate rash, facial/tongue/throat swelling, SOB or lightheadedness with hypotensionYES Has patient had a PCN reaction causing severe rash involving mucus membranes or skin necrosis: NO Has patient had a PCN reaction that required hospitalization NO Has patient had a PCN reaction occurring within the last 10 years: NO If all of the above answers are "NO", then may proceed with Cephalosporin use.    Medications:  Prior to Admission:  Prescriptions Prior to Admission  Medication Sig Dispense Refill Last Dose  . albuterol (ACCUNEB) 0.63 MG/3ML nebulizer solution Take 1 ampule by nebulization every 6 (six) hours as needed for wheezing or shortness of breath.    at prn  . albuterol (PROVENTIL HFA;VENTOLIN HFA) 108 (90 Base) MCG/ACT inhaler Inhale 2 puffs into the lungs every 6 (six) hours as needed for wheezing or shortness of breath. 1 Inhaler 0 Past Week at Unknown time  . amLODipine (NORVASC) 5 MG tablet Take 1 tablet (5 mg total) by mouth daily. OFFICE VISIT REQUIRED FOR ADDITIONAL REFILLS 30 tablet 0 12/13/2015 at 1145am  . omeprazole (PRILOSEC) 20 MG capsule TAKE ONE CAPSULE BY MOUTH ONCE DAILY (Patient taking differently: TAKE 20 MG BY MOUTH ONCE DAILY) 30 capsule 5 12/13/2015 at 1145am    Results for orders placed or performed during the hospital encounter of 12/13/15 (from the past 48 hour(s))  Lipase, blood  Status: None   Collection Time: 12/13/15 12:45 PM  Result Value Ref Range   Lipase 23 11 - 51 U/L  Comprehensive metabolic panel     Status: Abnormal   Collection Time: 12/13/15 12:45 PM  Result Value Ref Range   Sodium 138 135 - 145 mmol/L   Potassium 3.9 3.5 - 5.1 mmol/L   Chloride 106 101 - 111 mmol/L   CO2 24 22 - 32 mmol/L   Glucose, Bld 100 (H) 65 - 99 mg/dL   BUN 9 6 - 20 mg/dL   Creatinine, Ser 1.10 0.61 - 1.24 mg/dL   Calcium 9.6 8.9 - 10.3 mg/dL   Total Protein 7.6 6.5 - 8.1 g/dL   Albumin 4.4 3.5 - 5.0  g/dL   AST 28 15 - 41 U/L   ALT 45 17 - 63 U/L   Alkaline Phosphatase 55 38 - 126 U/L   Total Bilirubin 0.7 0.3 - 1.2 mg/dL   GFR calc non Af Amer >60 >60 mL/min   GFR calc Af Amer >60 >60 mL/min    Comment: (NOTE) The eGFR has been calculated using the CKD EPI equation. This calculation has not been validated in all clinical situations. eGFR's persistently <60 mL/min signify possible Chronic Kidney Disease.    Anion gap 8 5 - 15  CBC     Status: None   Collection Time: 12/13/15 12:45 PM  Result Value Ref Range   WBC 5.2 4.0 - 10.5 K/uL   RBC 5.41 4.22 - 5.81 MIL/uL   Hemoglobin 16.3 13.0 - 17.0 g/dL   HCT 46.0 39.0 - 52.0 %   MCV 85.0 78.0 - 100.0 fL   MCH 30.1 26.0 - 34.0 pg   MCHC 35.4 30.0 - 36.0 g/dL   RDW 12.8 11.5 - 15.5 %   Platelets 186 150 - 400 K/uL  Urinalysis, Routine w reflex microscopic     Status: Abnormal   Collection Time: 12/13/15  9:25 PM  Result Value Ref Range   Color, Urine AMBER (A) YELLOW    Comment: BIOCHEMICALS MAY BE AFFECTED BY COLOR   APPearance CLEAR CLEAR   Specific Gravity, Urine 1.015 1.005 - 1.030   pH 6.0 5.0 - 8.0   Glucose, UA NEGATIVE NEGATIVE mg/dL   Hgb urine dipstick NEGATIVE NEGATIVE   Bilirubin Urine SMALL (A) NEGATIVE   Ketones, ur 15 (A) NEGATIVE mg/dL   Protein, ur NEGATIVE NEGATIVE mg/dL   Nitrite NEGATIVE NEGATIVE   Leukocytes, UA NEGATIVE NEGATIVE    Comment: MICROSCOPIC NOT DONE ON URINES WITH NEGATIVE PROTEIN, BLOOD, LEUKOCYTES, NITRITE, OR GLUCOSE <1000 mg/dL.    Ct Abdomen Pelvis W Contrast  Result Date: 12/13/2015 CLINICAL DATA:  Initial evaluation for acute abdominal pain with distention and nausea for 1 week. Concern for obstruction. EXAM: CT ABDOMEN AND PELVIS WITH CONTRAST TECHNIQUE: Multidetector CT imaging of the abdomen and pelvis was performed using the standard protocol following bolus administration of intravenous contrast. CONTRAST:  144m ISOVUE-300 IOPAMIDOL (ISOVUE-300) INJECTION 61% COMPARISON:  Prior  CT from 05/31/2015. FINDINGS: Lower chest: Visualized lung bases are clear. No pleural or pericardial fusion. Hepatobiliary: Diffuse hypoattenuation of the liver compatible with steatosis. Liver otherwise unremarkable. Gallbladder surgically absent. No biliary dilatation. Pancreas: Pancreas within normal limits. Spleen: Spleen within normal limits. Adrenals/Urinary Tract: Adrenal glands are normal. Kidneys equal in size with symmetric enhancement. Several cysts noted within the right kidney, largest of which measures 8 cm. No nephrolithiasis, hydronephrosis, or focal enhancing renal mass. Ureters of normal caliber without  acute abnormality. Bladder partially distended and within normal limits. Stomach/Bowel: Sequela prior sleeve gastrectomy noted. Stomach otherwise unremarkable. Few mildly prominent loops of small bowel measuring up to 3.7 cm in diameter with associated air-fluid levels are present within knee mid and left abdomen. No discrete transition point identified. Findings favored to reflect possible adynamic ileus, possibly related to underlying enteritis, although a low grade partial small bowel obstruction not entirely excluded. These findings are similar but slightly less prominent as compared to most recent CT from 05/31/2015. No significant inflammatory changes about the small bowel. Ileum is decompressed without acute abnormality. Colon is decompressed without acute inflammatory changes. Mild sigmoid diverticulosis without evidence for acute diverticulitis. The No findings to suggest acute appendicitis. Vascular/Lymphatic: Normal intravascular enhancement seen throughout the intra-abdominal aorta and its branch vessels. Similar appearance of multiple prominent sub cm mesenteric lymph nodes at the root of the mesenteries, similar to previous. Again, findings may reflect sequela of underlying enteritis. No pathologically enlarged adenopathy within the abdomen and pelvis. Reproductive: Prostate normal.  Other: No free air or fluid. Small fat containing paraumbilical hernia noted. Musculoskeletal: No acute osseous abnormality. No worrisome lytic or blastic osseous lesions. IMPRESSION: 1. Few mildly prominent loops of small bowel clustered within the mid and left abdomen with a few associated air-fluid levels. No discrete transition point identified. Findings favored to reflect an adynamic ileus, possibly related to underlying enteritis, although a possible low grade partial obstruction is not entirely excluded. Overall, these findings are similar but less prominent as compared to most recent CT from 05/31/2015. 2. Similar appearance of mild mesenteric thickening at the root of the mesentery with multiple small subcentimeter lymph nodes. Findings are suspected to be reactive in nature, potentially related to chronic underlying enteritis, although chronic sclerosing mesenteritis or lymphoproliferative disorder are not entirely excluded. 3. Mild sigmoid diverticulosis without evidence for acute diverticulitis. 4. Status post sleeve gastrectomy. 5. Hepatic steatosis. Electronically Signed   By: Jeannine Boga M.D.   On: 12/13/2015 21:23    Review of Systems  Constitutional: Negative.  Negative for chills and fever.  HENT: Negative.   Eyes: Negative.   Respiratory: Negative.   Cardiovascular: Negative.   Gastrointestinal: Positive for abdominal pain, nausea and vomiting.  Genitourinary: Negative.   Musculoskeletal: Negative.   Skin: Negative.   Neurological: Negative.   Endo/Heme/Allergies: Negative.   Psychiatric/Behavioral: Negative.    Blood pressure (!) 143/89, pulse 62, temperature 97.6 F (36.4 C), temperature source Oral, resp. rate 18, height _0  (1.88 m), weight (!) 180.7 kg (398 lb 4.8 oz), SpO2 97 %. Physical Exam  Constitutional: He is oriented to person, place, and time. He appears well-developed and well-nourished. No distress.  HENT:  Head: Normocephalic and atraumatic.   Mouth/Throat: Oropharynx is clear and moist.  Eyes: Conjunctivae are normal. Pupils are equal, round, and reactive to light. No scleral icterus.  Neck: Normal range of motion. Neck supple.  Cardiovascular: Normal rate and intact distal pulses.   Respiratory: Effort normal. No respiratory distress. He exhibits no tenderness.  GI: Soft. He exhibits distension (protuberant). He exhibits no mass. There is no tenderness. There is no rebound and no guarding.  Musculoskeletal: Normal range of motion. He exhibits no edema, tenderness or deformity.  Neurological: He is alert and oriented to person, place, and time.  Skin: Skin is warm and dry. No rash noted. He is not diaphoretic. No erythema. No pallor.  Psychiatric: He has a normal mood and affect. His behavior is normal. Judgment and thought  content normal.    Assessment/Plan: Partial small bowel obstruction vs enteritis. Patient already has had some flatus. Continue bowel rest and IV fluid resuscitation..   Will get small bowel protocol to evaluate if contrast goes to colon.   If patient has worsening small bowel dilation, he will need NG Tube.    Offie Waide 12/14/2015, 2:38 AM

## 2015-12-15 ENCOUNTER — Inpatient Hospital Stay (HOSPITAL_COMMUNITY): Payer: BC Managed Care – PPO

## 2015-12-15 LAB — GLUCOSE, CAPILLARY
Glucose-Capillary: 90 mg/dL (ref 65–99)
Glucose-Capillary: 91 mg/dL (ref 65–99)
Glucose-Capillary: 90 mg/dL (ref 65–99)

## 2015-12-15 MED ORDER — POTASSIUM CHLORIDE CRYS ER 20 MEQ PO TBCR
40.0000 meq | EXTENDED_RELEASE_TABLET | Freq: Once | ORAL | Status: AC
  Administered 2015-12-15: 40 meq via ORAL
  Filled 2015-12-15: qty 2

## 2015-12-15 NOTE — Progress Notes (Signed)
Delayed note entry  I was asked to see the patient at the request of the nurse and the hospitalist when the patient refused nasogastric tube placement by Dr. Barry Dienes had ordered earlier this morning.  Patient states that he was feeling better with less belly pain but still had some cramps. He had no nausea, or vomiting. He was not burping or belching. He reported some flatus.  Morbidly obese, non-ill-appearing Soft, obese, mild tenderness to palpation in mid abdomen, no rebound or guarding  Reports a prior episode of something similar like this back in the spring.  He relayed regret getting a sleeve gastrectomy since he has regained a fair amount of his weight back. He states he should've gotten the procedure that would cause him pain if he ate something he wasn't supposed to eat. Tried to discuss with him that that is not the mindset with weight loss surgery because weight regain can happen with any of the procedures.  With him only having had 2 prior laparoscopic surgeries and no open abdominal procedures it would be unlikely but not impossible for him to perform intra-abdominal scar tissue. His small bowel on CT imaging was not that dilated and there is no clear transition.  Therefore I think we can leave the nasogastric tube out today, ice chips as tolerated, reassess abdominal exam in the morning  Leighton Ruff. Redmond Pulling, MD, FACS General, Bariatric, & Minimally Invasive Surgery [facility], [place]

## 2015-12-15 NOTE — Progress Notes (Signed)
PROGRESS NOTE    Brailyn Topel  [id number] DOB: 1970/04/07 DOA: 12/13/2015 PCP: [name], MD    Brief Narrative: Nissim Lias is a 45 y.o. male with history of gastric sleeve surgery who has been previously admitted for bowel obstruction, last one in march of this year, presents with abdominal pain and nausea. Patient has been having these symptoms for the last week with gradually declining bowel movements. His last bowel movement was yesterday afternoon. Pain is mostly periumbilical and in LLQ. Not related to food or bowel movement. In the ER, CT scan shows features concerning for ileus vs partial small bowel obstruction vs chronic enteritis. There was also some nonspecific abdominal lymphadenopathy. Patient has not had any vomiting. Patient will be admitted for further management of possible small bowel obstruction.     Assessment & Plan:   Principal Problem:   Small bowel obstruction, partial Active Problems:   HYPERTENSION, BENIGN ESSENTIAL   OSA (obstructive sleep apnea)   Partial small bowel obstruction  1-SBO vs enteritis. IV fluids, Pain medications  KUB; Normal Small Bowel pattern.  No Bowel movement yet.  Patient is frustrated with recurrent events. Explain to him about his risk of SBO because of prior surgery.  Will try clears  Discussed with Dr Loura Back who will see patient today.   2-CT scan showing nonspecific abdominal lymphadenopathy - This will need follow up as an outpatient Patient aware of results, he will need follow up.   3-Hypertension - Since patient is NPO, will give PRN IV hydralazine.  4-History of OSA - Nonclipiant with CPAP.  5-History of asthma - Not wheezing at this time.    DVT prophylaxis: heparin  Code Status: Full Code Family Communication: care discussed with patient.  Disposition Plan: remain inpatient for treatment SBO  Consultants:   Surgery    Procedures:   none   Antimicrobials:    none   Subjective: Pain better, no bowel movement yet . No significant nausea.   Objective: Vitals:   12/14/15 0542 12/14/15 1340 12/14/15 2139 12/15/15 0557  BP: 132/75 (!) 146/95 135/73 114/60  Pulse: 65 71 67 (!) 58  Resp: 20 20 18 18   Temp: 97.6 F (36.4 C) 97.7 F (36.5 C) 97.5 F (36.4 C) 97.4 F (36.3 C)  TempSrc: Oral Oral Oral Oral  SpO2: 97% 99% 100% 98%  Weight:      Height:        Intake/Output Summary (Last 24 hours) at 12/15/15 1338 Last data filed at 12/15/15 0948  Gross per 24 hour  Intake          1201.67 ml  Output                0 ml  Net          1201.67 ml   Filed Weights   12/13/15 1238 12/13/15 1239 12/14/15 0020  Weight: (!) 180.8 kg (398 lb 8 oz) (!) 180.5 kg (398 lb) (!) 180.7 kg (398 lb 4.8 oz)    Examination:  General exam: Appears calm and comfortable  Respiratory system:  Respiratory effort normal. Cardiovascular system: S1 & S2 heard, RRR. No JVD, murmurs, rubs, gallops or clicks. No pedal edema. Gastrointestinal system: Abdomen is nondistended. Mild left side tenderness.  Central nervous system: Alert and oriented.  Extremities: Moves extewities.  Skin: No rashes, lesions or ulcers     Data Reviewed: I have personally reviewed following labs and imaging studies  CBC:  Recent Labs Lab 12/13/15 1245 12/14/15 0422  WBC 5.2 4.8  HGB 16.3 14.8  HCT 46.0 42.3  MCV 85.0 84.9  PLT 186 0000000   Basic Metabolic Panel:  Recent Labs Lab 12/13/15 1245 12/14/15 0422  NA 138 137  K 3.9 3.5  CL 106 103  CO2 24 26  GLUCOSE 100* 102*  BUN 9 10  CREATININE 1.10 0.90  CALCIUM 9.6 8.8*   GFR: Estimated Creatinine Clearance: 182 mL/min (by C-G formula based on SCr of 0.9 mg/dL). Liver Function Tests:  Recent Labs Lab 12/13/15 1245  AST 28  ALT 45  ALKPHOS 55  BILITOT 0.7  PROT 7.6  ALBUMIN 4.4    Recent Labs Lab 12/13/15 1245  LIPASE 23   No results for input(s): AMMONIA in the last 168 hours. Coagulation  Profile: No results for input(s): INR, PROTIME in the last 168 hours. Cardiac Enzymes: No results for input(s): CKTOTAL, CKMB, CKMBINDEX, TROPONINI in the last 168 hours. BNP (last 3 results) No results for input(s): PROBNP in the last 8760 hours. HbA1C: No results for input(s): HGBA1C in the last 72 hours. CBG:  Recent Labs Lab 12/14/15 0812 12/14/15 1711 12/14/15 2135 12/15/15 0813  GLUCAP 116* 100* 107* 90   Lipid Profile: No results for input(s): CHOL, HDL, LDLCALC, TRIG, CHOLHDL, LDLDIRECT in the last 72 hours. Thyroid Function Tests: No results for input(s): TSH, T4TOTAL, FREET4, T3FREE, THYROIDAB in the last 72 hours. Anemia Panel: No results for input(s): VITAMINB12, FOLATE, FERRITIN, TIBC, IRON, RETICCTPCT in the last 72 hours. Sepsis Labs: No results for input(s): PROCALCITON, LATICACIDVEN in the last 168 hours.  No results found for this or any previous visit (from the past 240 hour(s)).       Radiology Studies: Ct Abdomen Pelvis W Contrast  Result Date: 12/13/2015 CLINICAL DATA:  Initial evaluation for acute abdominal pain with distention and nausea for 1 week. Concern for obstruction. EXAM: CT ABDOMEN AND PELVIS WITH CONTRAST TECHNIQUE: Multidetector CT imaging of the abdomen and pelvis was performed using the standard protocol following bolus administration of intravenous contrast. CONTRAST:  149mL ISOVUE-300 IOPAMIDOL (ISOVUE-300) INJECTION 61% COMPARISON:  Prior CT from 05/31/2015. FINDINGS: Lower chest: Visualized lung bases are clear. No pleural or pericardial fusion. Hepatobiliary: Diffuse hypoattenuation of the liver compatible with steatosis. Liver otherwise unremarkable. Gallbladder surgically absent. No biliary dilatation. Pancreas: Pancreas within normal limits. Spleen: Spleen within normal limits. Adrenals/Urinary Tract: Adrenal glands are normal. Kidneys equal in size with symmetric enhancement. Several cysts noted within the right kidney, largest of  which measures 8 cm. No nephrolithiasis, hydronephrosis, or focal enhancing renal mass. Ureters of normal caliber without acute abnormality. Bladder partially distended and within normal limits. Stomach/Bowel: Sequela prior sleeve gastrectomy noted. Stomach otherwise unremarkable. Few mildly prominent loops of small bowel measuring up to 3.7 cm in diameter with associated air-fluid levels are present within knee mid and left abdomen. No discrete transition point identified. Findings favored to reflect possible adynamic ileus, possibly related to underlying enteritis, although a low grade partial small bowel obstruction not entirely excluded. These findings are similar but slightly less prominent as compared to most recent CT from 05/31/2015. No significant inflammatory changes about the small bowel. Ileum is decompressed without acute abnormality. Colon is decompressed without acute inflammatory changes. Mild sigmoid diverticulosis without evidence for acute diverticulitis. The No findings to suggest acute appendicitis. Vascular/Lymphatic: Normal intravascular enhancement seen throughout the intra-abdominal aorta and its branch vessels. Similar appearance of multiple prominent sub cm mesenteric lymph nodes at the root of the mesenteries, similar to previous.  Again, findings may reflect sequela of underlying enteritis. No pathologically enlarged adenopathy within the abdomen and pelvis. Reproductive: Prostate normal. Other: No free air or fluid. Small fat containing paraumbilical hernia noted. Musculoskeletal: No acute osseous abnormality. No worrisome lytic or blastic osseous lesions. IMPRESSION: 1. Few mildly prominent loops of small bowel clustered within the mid and left abdomen with a few associated air-fluid levels. No discrete transition point identified. Findings favored to reflect an adynamic ileus, possibly related to underlying enteritis, although a possible low grade partial obstruction is not entirely  excluded. Overall, these findings are similar but less prominent as compared to most recent CT from 05/31/2015. 2. Similar appearance of mild mesenteric thickening at the root of the mesentery with multiple small subcentimeter lymph nodes. Findings are suspected to be reactive in nature, potentially related to chronic underlying enteritis, although chronic sclerosing mesenteritis or lymphoproliferative disorder are not entirely excluded. 3. Mild sigmoid diverticulosis without evidence for acute diverticulitis. 4. Status post sleeve gastrectomy. 5. Hepatic steatosis. Electronically Signed   By: Jeannine Boga M.D.   On: 12/13/2015 21:23   Dg Abd 2 Views  Result Date: 12/15/2015 CLINICAL DATA:  Abdominal pain for 2 days, evaluate for blockage EXAM: ABDOMEN - 2 VIEW COMPARISON:  12/13/2015 FINDINGS: There is normal small bowel gas pattern. Some colonic gas noted throughout the colon. IMPRESSION: Normal small bowel gas pattern. Some colonic gas noted throughout the colon. Electronically Signed   By: Lahoma Crocker M.D.   On: 12/15/2015 08:53        Scheduled Meds: . diatrizoate meglumine-sodium  90 mL Per NG tube Once  . famotidine (PEPCID) IV  20 mg Intravenous Q12H  . heparin subcutaneous  5,000 Units Subcutaneous Q8H   Continuous Infusions:     LOS: 2 days    Time spent:35 minutes.     Elmarie Shiley, MD [facility] Pager [phone]  If 7PM-7AM, please contact night-coverage [email or web address] Password [email or web address] 12/15/2015, 1:38 PM

## 2015-12-15 NOTE — Care Management Note (Addendum)
Case Management Note  Patient Details  Name: [name] [name] MRN: [id number] Date of Birth: 1970-12-11  Subjective/Objective:  Presents with abdominal pain and nausea,  history of gastric sleeve surgery who has been previously admitted for bowel obstruction. Resides with wife. Independent with ADL's , no DME usage PTA.  PCP: [name]  Action/Plan: Surgery following ...Marland KitchenMarland KitchenMarland Kitchenreturn to home when medically stable. CM to f/u disposition needs.  Expected Discharge Date:   12/15/2015               Expected Discharge Plan:  Home/Self Care  In-House Referral:     Discharge planning Services  CM Consult  Status of Service:  completed  If discussed at Long Length of Stay Meetings, dates discussed:    Additional Comments:  Sharin Mons, RN 12/15/2015, 12:11 PM

## 2015-12-15 NOTE — Progress Notes (Signed)
Subjective: Reports flatus. Less belly pain. Still some occasional cramps but not as bad as when he came in. No vomiting. No burping or belching. Did not walk yesterday. States he didn't know that he was supposed to. Abdominal x-ray this morning showed a normal bowel gas pattern.  Objective: Vital signs in last 24 hours: Temp:  [97.4 F (36.3 C)-98.6 F (37 C)] 98.6 F (37 C) (10/12 1411) Pulse Rate:  [58-67] 66 (10/12 1411) Resp:  [16-18] 16 (10/12 1411) BP: (114-135)/(60-82) 132/82 (10/12 1411) SpO2:  [98 %-100 %] 98 % (10/12 1411) Last BM Date: 12/13/15  Intake/Output from previous day: 10/11 0701 - 10/12 0700 In: 1251.7 [I.V.:1151.7; IV Piggyback:100] Out: -  Intake/Output this shift: Total I/O In: 220 [P.O.:220] Out: -   Alert, resting comfortably. Not ill appearing Obese, soft, minimal tenderness if any  Lab Results:   Recent Labs  12/13/15 1245 12/14/15 0422  WBC 5.2 4.8  HGB 16.3 14.8  HCT 46.0 42.3  PLT 186 152   BMET  Recent Labs  12/13/15 1245 12/14/15 0422  NA 138 137  K 3.9 3.5  CL 106 103  CO2 24 26  GLUCOSE 100* 102*  BUN 9 10  CREATININE 1.10 0.90  CALCIUM 9.6 8.8*   PT/INR No results for input(s): LABPROT, INR in the last 72 hours. ABG No results for input(s): PHART, HCO3 in the last 72 hours.  Invalid input(s): PCO2, PO2  Studies/Results: Ct Abdomen Pelvis W Contrast  Result Date: 12/13/2015 CLINICAL DATA:  Initial evaluation for acute abdominal pain with distention and nausea for 1 week. Concern for obstruction. EXAM: CT ABDOMEN AND PELVIS WITH CONTRAST TECHNIQUE: Multidetector CT imaging of the abdomen and pelvis was performed using the standard protocol following bolus administration of intravenous contrast. CONTRAST:  152mL ISOVUE-300 IOPAMIDOL (ISOVUE-300) INJECTION 61% COMPARISON:  Prior CT from 05/31/2015. FINDINGS: Lower chest: Visualized lung bases are clear. No pleural or pericardial fusion. Hepatobiliary: Diffuse  hypoattenuation of the liver compatible with steatosis. Liver otherwise unremarkable. Gallbladder surgically absent. No biliary dilatation. Pancreas: Pancreas within normal limits. Spleen: Spleen within normal limits. Adrenals/Urinary Tract: Adrenal glands are normal. Kidneys equal in size with symmetric enhancement. Several cysts noted within the right kidney, largest of which measures 8 cm. No nephrolithiasis, hydronephrosis, or focal enhancing renal mass. Ureters of normal caliber without acute abnormality. Bladder partially distended and within normal limits. Stomach/Bowel: Sequela prior sleeve gastrectomy noted. Stomach otherwise unremarkable. Few mildly prominent loops of small bowel measuring up to 3.7 cm in diameter with associated air-fluid levels are present within knee mid and left abdomen. No discrete transition point identified. Findings favored to reflect possible adynamic ileus, possibly related to underlying enteritis, although a low grade partial small bowel obstruction not entirely excluded. These findings are similar but slightly less prominent as compared to most recent CT from 05/31/2015. No significant inflammatory changes about the small bowel. Ileum is decompressed without acute abnormality. Colon is decompressed without acute inflammatory changes. Mild sigmoid diverticulosis without evidence for acute diverticulitis. The No findings to suggest acute appendicitis. Vascular/Lymphatic: Normal intravascular enhancement seen throughout the intra-abdominal aorta and its branch vessels. Similar appearance of multiple prominent sub cm mesenteric lymph nodes at the root of the mesenteries, similar to previous. Again, findings may reflect sequela of underlying enteritis. No pathologically enlarged adenopathy within the abdomen and pelvis. Reproductive: Prostate normal. Other: No free air or fluid. Small fat containing paraumbilical hernia noted. Musculoskeletal: No acute osseous abnormality. No  worrisome lytic or blastic osseous lesions.  IMPRESSION: 1. Few mildly prominent loops of small bowel clustered within the mid and left abdomen with a few associated air-fluid levels. No discrete transition point identified. Findings favored to reflect an adynamic ileus, possibly related to underlying enteritis, although a possible low grade partial obstruction is not entirely excluded. Overall, these findings are similar but less prominent as compared to most recent CT from 05/31/2015. 2. Similar appearance of mild mesenteric thickening at the root of the mesentery with multiple small subcentimeter lymph nodes. Findings are suspected to be reactive in nature, potentially related to chronic underlying enteritis, although chronic sclerosing mesenteritis or lymphoproliferative disorder are not entirely excluded. 3. Mild sigmoid diverticulosis without evidence for acute diverticulitis. 4. Status post sleeve gastrectomy. 5. Hepatic steatosis. Electronically Signed   By: Jeannine Boga M.D.   On: 12/13/2015 21:23   Dg Abd 2 Views  Result Date: 12/15/2015 CLINICAL DATA:  Abdominal pain for 2 days, evaluate for blockage EXAM: ABDOMEN - 2 VIEW COMPARISON:  12/13/2015 FINDINGS: There is normal small bowel gas pattern. Some colonic gas noted throughout the colon. IMPRESSION: Normal small bowel gas pattern. Some colonic gas noted throughout the colon. Electronically Signed   By: Lahoma Crocker M.D.   On: 12/15/2015 08:53    Anti-infectives: Anti-infectives    None      Assessment/Plan: Abdominal pain Ileus H/o sleeve gastrectomy  He reports that last week he had several day issue of watery diarrhea and abdominal cramps. He did report some dark watery stool as well during this time. Then after it resolved is when he started to feel ill with bloating and cramps in his abdomen. He reports something similar like this back in March however it was not as bad with the diarrhea.  In re-reviewing the CT scan and  getting the additional history of several day history of large volume watery diarrhea last week, I think he might of had a bad case of enteritis which then led to an ileus. The small amount of reactive lymphadenopathy in the base of the small bowel mesentery would be consistent with a recent enteritis as opposed to a true bowel obstruction.  This is my best working theory at This point and I shared this with the patient and my thought process  Recommended clear liquids today and see how he does overnight. Explained that he may have some ongoing mild cramps. My hope and expectation is that this will continue to get better.  He can have outpatient follow-up with gastroenterology.  If does well overnight with out issue will advance diet to full liquids in the morning  Leighton Ruff. Redmond Pulling, MD, FACS General, Bariatric, & Minimally Invasive Surgery [facility], [place]    LOS: 2 days    Gayland Curry 12/15/2015

## 2015-12-15 NOTE — Progress Notes (Signed)
[facility] Progress Note     Subjective: Still with intermittent, mild abdominal pain. Denies nausea, vomiting, or belching. +flatus. Denies BM. Ambulating in room. Discussed abdominal film findings with patient.  Objective: Vital signs in last 24 hours: Temp:  [97.4 F (36.3 C)-97.7 F (36.5 C)] 97.4 F (36.3 C) (10/12 0557) Pulse Rate:  [58-71] 58 (10/12 0557) Resp:  [18-20] 18 (10/12 0557) BP: (114-146)/(60-95) 114/60 (10/12 0557) SpO2:  [98 %-100 %] 98 % (10/12 0557) Last BM Date: 12/13/15  Intake/Output from previous day: 10/11 0701 - 10/12 0700 In: 1251.7 [I.V.:1151.7; IV Piggyback:100] Out: -   PE: Gen:  Alert, NAD, pleasant Abd: Soft, mild tenderness to deep palpation of LLQ, mild distention, +BS, no masses/hernias. Ext:  No erythema, edema, or tenderness   Lab Results:   Recent Labs  12/13/15 1245 12/14/15 0422  WBC 5.2 4.8  HGB 16.3 14.8  HCT 46.0 42.3  PLT 186 152   BMET  Recent Labs  12/13/15 1245 12/14/15 0422  NA 138 137  K 3.9 3.5  CL 106 103  CO2 24 26  GLUCOSE 100* 102*  BUN 9 10  CREATININE 1.10 0.90  CALCIUM 9.6 8.8*   PT/INR No results for input(s): LABPROT, INR in the last 72 hours. CMP     Component Value Date/Time   NA 137 12/14/2015 0422   K 3.5 12/14/2015 0422   CL 103 12/14/2015 0422   CO2 26 12/14/2015 0422   GLUCOSE 102 (H) 12/14/2015 0422   GLUCOSE 83 03/18/2006 0953   BUN 10 12/14/2015 0422   CREATININE 0.90 12/14/2015 0422   CALCIUM 8.8 (L) 12/14/2015 0422   PROT 7.6 12/13/2015 1245   ALBUMIN 4.4 12/13/2015 1245   AST 28 12/13/2015 1245   ALT 45 12/13/2015 1245   ALKPHOS 55 12/13/2015 1245   BILITOT 0.7 12/13/2015 1245   GFRNONAA >60 12/14/2015 0422   GFRAA >60 12/14/2015 0422   Lipase     Component Value Date/Time   LIPASE 23 12/13/2015 1245   Studies/Results: Ct Abdomen Pelvis W Contrast  Result Date: 12/13/2015 CLINICAL DATA:  Initial evaluation for acute abdominal pain with  distention and nausea for 1 week. Concern for obstruction. EXAM: CT ABDOMEN AND PELVIS WITH CONTRAST TECHNIQUE: Multidetector CT imaging of the abdomen and pelvis was performed using the standard protocol following bolus administration of intravenous contrast. CONTRAST:  134mL ISOVUE-300 IOPAMIDOL (ISOVUE-300) INJECTION 61% COMPARISON:  Prior CT from 05/31/2015. FINDINGS: Lower chest: Visualized lung bases are clear. No pleural or pericardial fusion. Hepatobiliary: Diffuse hypoattenuation of the liver compatible with steatosis. Liver otherwise unremarkable. Gallbladder surgically absent. No biliary dilatation. Pancreas: Pancreas within normal limits. Spleen: Spleen within normal limits. Adrenals/Urinary Tract: Adrenal glands are normal. Kidneys equal in size with symmetric enhancement. Several cysts noted within the right kidney, largest of which measures 8 cm. No nephrolithiasis, hydronephrosis, or focal enhancing renal mass. Ureters of normal caliber without acute abnormality. Bladder partially distended and within normal limits. Stomach/Bowel: Sequela prior sleeve gastrectomy noted. Stomach otherwise unremarkable. Few mildly prominent loops of small bowel measuring up to 3.7 cm in diameter with associated air-fluid levels are present within knee mid and left abdomen. No discrete transition point identified. Findings favored to reflect possible adynamic ileus, possibly related to underlying enteritis, although a low grade partial small bowel obstruction not entirely excluded. These findings are similar but slightly less prominent as compared to most recent CT from 05/31/2015. No significant inflammatory changes about the small bowel. Ileum is decompressed  without acute abnormality. Colon is decompressed without acute inflammatory changes. Mild sigmoid diverticulosis without evidence for acute diverticulitis. The No findings to suggest acute appendicitis. Vascular/Lymphatic: Normal intravascular enhancement seen  throughout the intra-abdominal aorta and its branch vessels. Similar appearance of multiple prominent sub cm mesenteric lymph nodes at the root of the mesenteries, similar to previous. Again, findings may reflect sequela of underlying enteritis. No pathologically enlarged adenopathy within the abdomen and pelvis. Reproductive: Prostate normal. Other: No free air or fluid. Small fat containing paraumbilical hernia noted. Musculoskeletal: No acute osseous abnormality. No worrisome lytic or blastic osseous lesions. IMPRESSION: 1. Few mildly prominent loops of small bowel clustered within the mid and left abdomen with a few associated air-fluid levels. No discrete transition point identified. Findings favored to reflect an adynamic ileus, possibly related to underlying enteritis, although a possible low grade partial obstruction is not entirely excluded. Overall, these findings are similar but less prominent as compared to most recent CT from 05/31/2015. 2. Similar appearance of mild mesenteric thickening at the root of the mesentery with multiple small subcentimeter lymph nodes. Findings are suspected to be reactive in nature, potentially related to chronic underlying enteritis, although chronic sclerosing mesenteritis or lymphoproliferative disorder are not entirely excluded. 3. Mild sigmoid diverticulosis without evidence for acute diverticulitis. 4. Status post sleeve gastrectomy. 5. Hepatic steatosis. Electronically Signed   By: Jeannine Boga M.D.   On: 12/13/2015 21:23   Dg Abd 2 Views  Result Date: 12/15/2015 CLINICAL DATA:  Abdominal pain for 2 days, evaluate for blockage EXAM: ABDOMEN - 2 VIEW COMPARISON:  12/13/2015 FINDINGS: There is normal small bowel gas pattern. Some colonic gas noted throughout the colon. IMPRESSION: Normal small bowel gas pattern. Some colonic gas noted throughout the colon. Electronically Signed   By: Lahoma Crocker M.D.   On: 12/15/2015 08:53     Anti-infectives: Anti-infectives    None     Assessment/Plan pSBO vs enteritis - normal bowel gas pattern on repeat abdominal film - having flatus but no BM, not belching - will start clears today - dulcolax suppository   Plan: clear liquid diet  NGT decompression if patient does not tolerate -- though he has been denying NGT.    LOS: 2 days    [name] [name] , [facility] 12/15/2015, 12:57 PM Pager: [phone] Consults: [phone] Mon-Fri 7:00 am-4:30 pm Sat-Sun 7:00 am-11:30 am

## 2015-12-16 ENCOUNTER — Encounter: Payer: Self-pay | Admitting: Gastroenterology

## 2015-12-16 LAB — CBC
HEMATOCRIT: 44 % (ref 39.0–52.0)
HEMOGLOBIN: 15.1 g/dL (ref 13.0–17.0)
MCH: 29.3 pg (ref 26.0–34.0)
MCHC: 34.3 g/dL (ref 30.0–36.0)
MCV: 85.3 fL (ref 78.0–100.0)
Platelets: 190 10*3/uL (ref 150–400)
RBC: 5.16 MIL/uL (ref 4.22–5.81)
RDW: 12.5 % (ref 11.5–15.5)
WBC: 5.5 10*3/uL (ref 4.0–10.5)

## 2015-12-16 LAB — BASIC METABOLIC PANEL
ANION GAP: 7 (ref 5–15)
BUN: <5 mg/dL — ABNORMAL LOW (ref 6–20)
CALCIUM: 9.3 mg/dL (ref 8.9–10.3)
CO2: 29 mmol/L (ref 22–32)
CREATININE: 1.1 mg/dL (ref 0.61–1.24)
Chloride: 104 mmol/L (ref 101–111)
GFR calc non Af Amer: >60 mL/min (ref >60)
Glucose, Bld: 94 mg/dL (ref 65–99)
Potassium: 3.9 mmol/L (ref 3.5–5.1)
SODIUM: 140 mmol/L (ref 135–145)

## 2015-12-16 LAB — GLUCOSE, CAPILLARY: GLUCOSE-CAPILLARY: 89 mg/dL (ref 65–99)

## 2015-12-16 MED ORDER — FAMOTIDINE 20 MG PO TABS: 20.0000 mg | ORAL_TABLET | Freq: Two times a day (BID) | ORAL | Status: DC

## 2015-12-16 MED ORDER — ONDANSETRON HCL 4 MG PO TABS: 4.0000 mg | ORAL_TABLET | Freq: Four times a day (QID) | ORAL | 0 refills | Status: DC | PRN

## 2015-12-16 MED ORDER — TRAMADOL HCL 50 MG PO TABS: 50.0000 mg | ORAL_TABLET | Freq: Four times a day (QID) | ORAL | 0 refills | Status: DC | PRN

## 2015-12-16 NOTE — Progress Notes (Signed)
Patient requesting diet advancement for breakfast, per surgery may advance if feeling well. Patient stated feeling better and had bowel movement overnight. Dr. Frederic Jericho made aware and gave verbal order for full liquid diet. Betha Loa Banessa Mao, RN

## 2015-12-16 NOTE — Progress Notes (Signed)
[facility] Progress Note     Subjective: Intermittent abdominal cramping. 2 BMs. +flatus. Denies nausea/vomiting. Reports a small amount of belching. Ambulating.   Objective: Vital signs in last 24 hours: Temp:  [98.1 F (36.7 C)-98.6 F (37 C)] 98.1 F (36.7 C) (10/13 0537) Pulse Rate:  [66-67] 66 (10/13 0537) Resp:  [16-18] 18 (10/13 0537) BP: (132-146)/(82-101) 146/93 (10/13 0537) SpO2:  [97 %-98 %] 97 % (10/13 0537) Last BM Date: 12/15/15  Intake/Output from previous day: 10/12 0701 - 10/13 0700 In: 220 [P.O.:220] Out: -  Intake/Output this shift: Total I/O In: 100 [IV Piggyback:100] Out: -   PE: Alert, resting comfortably. Not ill appearing Obese, soft, non-tender, +BS  Lab Results:   Recent Labs  12/14/15 0422 12/16/15 0534  WBC 4.8 5.5  HGB 14.8 15.1  HCT 42.3 44.0  PLT 152 190   BMET  Recent Labs  12/14/15 0422 12/16/15 0534  NA 137 140  K 3.5 3.9  CL 103 104  CO2 26 29  GLUCOSE 102* 94  BUN 10 <5*  CREATININE 0.90 1.10  CALCIUM 8.8* 9.3   PT/INR No results for input(s): LABPROT, INR in the last 72 hours. CMP     Component Value Date/Time   NA 140 12/16/2015 0534   K 3.9 12/16/2015 0534   CL 104 12/16/2015 0534   CO2 29 12/16/2015 0534   GLUCOSE 94 12/16/2015 0534   GLUCOSE 83 03/18/2006 0953   BUN <5 (L) 12/16/2015 0534   CREATININE 1.10 12/16/2015 0534   CALCIUM 9.3 12/16/2015 0534   PROT 7.6 12/13/2015 1245   ALBUMIN 4.4 12/13/2015 1245   AST 28 12/13/2015 1245   ALT 45 12/13/2015 1245   ALKPHOS 55 12/13/2015 1245   BILITOT 0.7 12/13/2015 1245   GFRNONAA >60 12/16/2015 0534   GFRAA >60 12/16/2015 0534   Lipase     Component Value Date/Time   LIPASE 23 12/13/2015 1245   Studies/Results: Dg Abd 2 Views  Result Date: 12/15/2015 CLINICAL DATA:  Abdominal pain for 2 days, evaluate for blockage EXAM: ABDOMEN - 2 VIEW COMPARISON:  12/13/2015 FINDINGS: There is normal small bowel gas pattern. Some colonic gas  noted throughout the colon. IMPRESSION: Normal small bowel gas pattern. Some colonic gas noted throughout the colon. Electronically Signed   By: Lahoma Crocker M.D.   On: 12/15/2015 08:53   Anti-infectives: Anti-infectives    None     Assessment/Plan Abdominal pain Ileus PMH sleep gastrectomy  - tolerating full liquid diet and having bowel function - normal bowel gas pattern on abd film -GI follow up  Plan: advance to soft diet for lunch, if tolerates without nausea/vomiting he is stable for discharge from a surgical standpoint and can follow-up with GI.   LOS: 3 days    Jill Alexanders , [facility] 12/16/2015, 1:22 PM Pager: [phone] Consults: [phone] Mon-Fri 7:00 am-4:30 pm Sat-Sun 7:00 am-11:30 am

## 2015-12-16 NOTE — Discharge Summary (Signed)
Physician Discharge Summary  [name] [name] [id number] DOB: Feb 03, 1971 DOA: 12/13/2015  PCP: [name] Norris, MD  Admit date: 12/13/2015 Discharge date: 12/16/2015  Admitted From: Home Disposition:  home  Recommendations for Outpatient Follow-up:  1. Follow up with PCP in 1-2 weeks 2. Please obtain BMP/CBC in one week 3. Needs to follow up with GI for recurrent Enteritis/     Discharge Condition:Stable.  CODE STATUS: Full code.  Diet recommendation: Heart Healthy   Brief/Interim Summary: [name] [name] a 45 y.o.malewith history of gastric sleeve surgery who has been previously admitted for bowel obstruction, last one in march of this year, presents with abdominal pain and nausea. Patient has been having these symptoms for the last week with gradually declining bowel movements. His last bowel movement was yesterday afternoon. Pain is mostly periumbilical and in LLQ. Not related to food or bowel movement. In the ER, CT scan shows features concerning for ileus vs partial small bowel obstruction vs chronic enteritis. There was also some nonspecific abdominal lymphadenopathy. Patient has not had any vomiting. Patient will be admitted for further management of possible small bowel obstruction.     Assessment & Plan:   Principal Problem:   Small bowel obstruction, partial Active Problems:   HYPERTENSION, BENIGN ESSENTIAL   OSA (obstructive sleep apnea)   Partial small bowel obstruction  1- enteritis. IV fluids, Pain medications  KUB; Normal Small Bowel pattern.  Had 2 BM overnight  Tolerating full liquid diet. If tolerates bland diet plan to discgharge today  Dr [name] [name] with general sx think patient has enteritis , less likely partial SBO. Due to CT finding of lymphadenopathy.  Last admission patient had similar presentation, ER was normal, GI pathogen negative.  I have arrange a follow up appointment with GI for further evaluation of recurrent episodes.   2-CT scan  showing nonspecific abdominal lymphadenopathy- This will need follow up as an outpatient Patient aware of results, he will need follow up.   3-Hypertension- resume Norvasc at discharge.   4-History of OSA- Nonclipiant with CPAP.  5-History of asthma- Not wheezing at this time.   Discharge Diagnoses:    Enteritis.   HYPERTENSION, BENIGN ESSENTIAL   OSA (obstructive sleep apnea)   Partial small bowel obstruction    Discharge Instructions  Discharge Instructions    Diet - low sodium heart healthy    Complete by:  As directed    Increase activity slowly    Complete by:  As directed        Medication List    TAKE these medications   albuterol 0.63 MG/3ML nebulizer solution Commonly known as:  ACCUNEB Take 1 ampule by nebulization every 6 (six) hours as needed for wheezing or shortness of breath.   albuterol 108 (90 Base) MCG/ACT inhaler Commonly known as:  PROVENTIL HFA;VENTOLIN HFA Inhale 2 puffs into the lungs every 6 (six) hours as needed for wheezing or shortness of breath.   amLODipine 5 MG tablet Commonly known as:  NORVASC Take 1 tablet (5 mg total) by mouth daily. OFFICE VISIT REQUIRED FOR ADDITIONAL REFILLS   omeprazole 20 MG capsule Commonly known as:  PRILOSEC TAKE ONE CAPSULE BY MOUTH ONCE DAILY What changed:  See the new instructions.   ondansetron 4 MG tablet Commonly known as:  ZOFRAN Take 1 tablet (4 mg total) by mouth every 6 (six) hours as needed for nausea.   traMADol 50 MG tablet Commonly known as:  ULTRAM Take 1 tablet (50 mg total) by mouth every 6 (six) hours  as needed.      Follow-up Information    [name] Bowie, MD On 12/28/2015.   Specialty:  Gastroenterology Why:  Appointment with Dr. Edison Nasuti [name] is on 12/28/15 at 10:30am Contact information: [place] [id number] [phone]          Allergies  Allergen Reactions  . Bee Venom Anaphylaxis  . Codeine Anaphylaxis    Tolerates dilaudid  . Hornet  Venom Anaphylaxis  . Penicillins Hives and Other (See Comments)    Has patient had a PCN reaction causing immediate rash, facial/tongue/throat swelling, SOB or lightheadedness with hypotensionYES Has patient had a PCN reaction causing severe rash involving mucus membranes or skin necrosis: NO Has patient had a PCN reaction that required hospitalization NO Has patient had a PCN reaction occurring within the last 10 years: NO If all of the above answers are "NO", then may proceed with Cephalosporin use.    Consultations:  General sxp   Procedures/Studies: Ct Abdomen Pelvis W Contrast  Result Date: 12/13/2015 CLINICAL DATA:  Initial evaluation for acute abdominal pain with distention and nausea for 1 week. Concern for obstruction. EXAM: CT ABDOMEN AND PELVIS WITH CONTRAST TECHNIQUE: Multidetector CT imaging of the abdomen and pelvis was performed using the standard protocol following bolus administration of intravenous contrast. CONTRAST:  137mL ISOVUE-300 IOPAMIDOL (ISOVUE-300) INJECTION 61% COMPARISON:  Prior CT from 05/31/2015. FINDINGS: Lower chest: Visualized lung bases are clear. No pleural or pericardial fusion. Hepatobiliary: Diffuse hypoattenuation of the liver compatible with steatosis. Liver otherwise unremarkable. Gallbladder surgically absent. No biliary dilatation. Pancreas: Pancreas within normal limits. Spleen: Spleen within normal limits. Adrenals/Urinary Tract: Adrenal glands are normal. Kidneys equal in size with symmetric enhancement. Several cysts noted within the right kidney, largest of which measures 8 cm. No nephrolithiasis, hydronephrosis, or focal enhancing renal mass. Ureters of normal caliber without acute abnormality. Bladder partially distended and within normal limits. Stomach/Bowel: Sequela prior sleeve gastrectomy noted. Stomach otherwise unremarkable. Few mildly prominent loops of small bowel measuring up to 3.7 cm in diameter with associated air-fluid levels are  present within knee mid and left abdomen. No discrete transition point identified. Findings favored to reflect possible adynamic ileus, possibly related to underlying enteritis, although a low grade partial small bowel obstruction not entirely excluded. These findings are similar but slightly less prominent as compared to most recent CT from 05/31/2015. No significant inflammatory changes about the small bowel. Ileum is decompressed without acute abnormality. Colon is decompressed without acute inflammatory changes. Mild sigmoid diverticulosis without evidence for acute diverticulitis. The No findings to suggest acute appendicitis. Vascular/Lymphatic: Normal intravascular enhancement seen throughout the intra-abdominal aorta and its branch vessels. Similar appearance of multiple prominent sub cm mesenteric lymph nodes at the root of the mesenteries, similar to previous. Again, findings may reflect sequela of underlying enteritis. No pathologically enlarged adenopathy within the abdomen and pelvis. Reproductive: Prostate normal. Other: No free air or fluid. Small fat containing paraumbilical hernia noted. Musculoskeletal: No acute osseous abnormality. No worrisome lytic or blastic osseous lesions. IMPRESSION: 1. Few mildly prominent loops of small bowel clustered within the mid and left abdomen with a few associated air-fluid levels. No discrete transition point identified. Findings favored to reflect an adynamic ileus, possibly related to underlying enteritis, although a possible low grade partial obstruction is not entirely excluded. Overall, these findings are similar but less prominent as compared to most recent CT from 05/31/2015. 2. Similar appearance of mild mesenteric thickening at the root of the mesentery with multiple small  subcentimeter lymph nodes. Findings are suspected to be reactive in nature, potentially related to chronic underlying enteritis, although chronic sclerosing mesenteritis or  lymphoproliferative disorder are not entirely excluded. 3. Mild sigmoid diverticulosis without evidence for acute diverticulitis. 4. Status post sleeve gastrectomy. 5. Hepatic steatosis. Electronically Signed   By: Jeannine Boga M.D.   On: 12/13/2015 21:23   Dg Abd 2 Views  Result Date: 12/15/2015 CLINICAL DATA:  Abdominal pain for 2 days, evaluate for blockage EXAM: ABDOMEN - 2 VIEW COMPARISON:  12/13/2015 FINDINGS: There is normal small bowel gas pattern. Some colonic gas noted throughout the colon. IMPRESSION: Normal small bowel gas pattern. Some colonic gas noted throughout the colon. Electronically Signed   By: Lahoma Crocker M.D.   On: 12/15/2015 08:53        Subjective: No significant abdominal pain.  Belching some.  Tolerating diet. Had 2 BM  Discharge Exam: Vitals:   12/16/15 0537 12/16/15 1355  BP: (!) 146/93 138/85  Pulse: 66 64  Resp: 18 16  Temp: 98.1 F (36.7 C) 98.3 F (36.8 C)   Vitals:   12/15/15 1411 12/15/15 2118 12/16/15 0537 12/16/15 1355  BP: 132/82 (!) 146/101 (!) 146/93 138/85  Pulse: 66 67 66 64  Resp: 16 18 18 16   Temp: 98.6 F (37 C) 98.2 F (36.8 C) 98.1 F (36.7 C) 98.3 F (36.8 C)  TempSrc: Oral Oral Oral Oral  SpO2: 98% 98% 97% 98%  Weight:      Height:        General: Pt is alert, awake, not in acute distress Cardiovascular: RRR, S1/S2 +, no rubs, no gallops Respiratory: CTA bilaterally, no wheezing, no rhonchi Abdominal: Soft, NT, ND, bowel sounds + Extremities: no edema, no cyanosis    The results of significant diagnostics from this hospitalization (including imaging, microbiology, ancillary and laboratory) are listed below for reference.     Microbiology: No results found for this or any previous visit (from the past 240 hour(s)).   Labs: BNP (last 3 results) No results for input(s): BNP in the last 8760 hours. Basic Metabolic Panel:  Recent Labs Lab 12/13/15 1245 12/14/15 0422 12/16/15 0534  NA 138 137 140  K  3.9 3.5 3.9  CL 106 103 104  CO2 24 26 29   GLUCOSE 100* 102* 94  BUN 9 10 <5*  CREATININE 1.10 0.90 1.10  CALCIUM 9.6 8.8* 9.3   Liver Function Tests:  Recent Labs Lab 12/13/15 1245  AST 28  ALT 45  ALKPHOS 55  BILITOT 0.7  PROT 7.6  ALBUMIN 4.4    Recent Labs Lab 12/13/15 1245  LIPASE 23   No results for input(s): AMMONIA in the last 168 hours. CBC:  Recent Labs Lab 12/13/15 1245 12/14/15 0422 12/16/15 0534  WBC 5.2 4.8 5.5  HGB 16.3 14.8 15.1  HCT 46.0 42.3 44.0  MCV 85.0 84.9 85.3  PLT 186 152 190   Cardiac Enzymes: No results for input(s): CKTOTAL, CKMB, CKMBINDEX, TROPONINI in the last 168 hours. BNP: Invalid input(s): POCBNP CBG:  Recent Labs Lab 12/14/15 2135 12/15/15 0813 12/15/15 1700 12/15/15 2002 12/16/15 0550  GLUCAP 107* 90 91 90 89   D-Dimer No results for input(s): DDIMER in the last 72 hours. Hgb A1c No results for input(s): HGBA1C in the last 72 hours. Lipid Profile No results for input(s): CHOL, HDL, LDLCALC, TRIG, CHOLHDL, LDLDIRECT in the last 72 hours. Thyroid function studies No results for input(s): TSH, T4TOTAL, T3FREE, THYROIDAB in the last 72 hours.  Invalid input(s): FREET3 Anemia work up No results for input(s): VITAMINB12, FOLATE, FERRITIN, TIBC, IRON, RETICCTPCT in the last 72 hours. Urinalysis    Component Value Date/Time   COLORURINE AMBER (A) 12/13/2015 2125   APPEARANCEUR CLEAR 12/13/2015 2125   LABSPEC 1.015 12/13/2015 2125   PHURINE 6.0 12/13/2015 2125   GLUCOSEU NEGATIVE 12/13/2015 2125   HGBUR NEGATIVE 12/13/2015 2125   BILIRUBINUR SMALL (A) 12/13/2015 2125   KETONESUR 15 (A) 12/13/2015 2125   PROTEINUR NEGATIVE 12/13/2015 2125   UROBILINOGEN 1.0 05/18/2008 1235   NITRITE NEGATIVE 12/13/2015 2125   LEUKOCYTESUR NEGATIVE 12/13/2015 2125   Sepsis Labs Invalid input(s): PROCALCITONIN,  WBC,  LACTICIDVEN Microbiology No results found for this or any previous visit (from the past 240  hour(s)).   Time coordinating discharge: Over 30 minutes  SIGNED:   Elmarie Shiley, MD  [facility] 12/16/2015, 3:05 PM Pager   If 7PM-7AM, please contact night-coverage [email or web address] Password [email or web address]

## 2015-12-21 ENCOUNTER — Encounter: Payer: Self-pay | Admitting: Family Medicine

## 2015-12-21 ENCOUNTER — Encounter: Payer: Self-pay | Admitting: *Deleted

## 2015-12-21 ENCOUNTER — Ambulatory Visit (INDEPENDENT_AMBULATORY_CARE_PROVIDER_SITE_OTHER): Payer: BC Managed Care – PPO | Admitting: Family Medicine

## 2015-12-21 VITALS — BP 138/88 | HR 86 | Temp 97.5°F | Wt >= 6400 oz

## 2015-12-21 DIAGNOSIS — I10 Essential (primary) hypertension: Secondary | ICD-10-CM

## 2015-12-21 DIAGNOSIS — K529 Noninfective gastroenteritis and colitis, unspecified: Secondary | ICD-10-CM | POA: Diagnosis not present

## 2015-12-21 HISTORY — DX: Noninfective gastroenteritis and colitis, unspecified: K52.9

## 2015-12-21 LAB — LIPID PANEL
CHOLESTEROL: 165 mg/dL (ref 0–200)
HDL: 38.6 mg/dL — AB (ref >39.00)
LDL CALC: 93 mg/dL (ref 0–99)
NonHDL: 126.56
TRIGLYCERIDES: 168 mg/dL — AB (ref 0.0–149.0)
Total CHOL/HDL Ratio: 4
VLDL: 33.6 mg/dL (ref 0.0–40.0)

## 2015-12-21 LAB — CBC WITH DIFFERENTIAL/PLATELET
BASOS ABS: 0 10*3/uL (ref 0.0–0.1)
Basophils Relative: 0.2 % (ref 0.0–3.0)
EOS ABS: 0.1 10*3/uL (ref 0.0–0.7)
Eosinophils Relative: 1.4 % (ref 0.0–5.0)
HCT: 45.4 % (ref 39.0–52.0)
Hemoglobin: 15.7 g/dL (ref 13.0–17.0)
LYMPHS ABS: 1.1 10*3/uL (ref 0.7–4.0)
Lymphocytes Relative: 19.5 % (ref 12.0–46.0)
MCHC: 34.7 g/dL (ref 30.0–36.0)
MCV: 86.4 fl (ref 78.0–100.0)
MONO ABS: 0.4 10*3/uL (ref 0.1–1.0)
Monocytes Relative: 8 % (ref 3.0–12.0)
NEUTROS ABS: 4 10*3/uL (ref 1.4–7.7)
NEUTROS PCT: 70.9 % (ref 43.0–77.0)
PLATELETS: 184 10*3/uL (ref 150.0–400.0)
RBC: 5.26 Mil/uL (ref 4.22–5.81)
RDW: 13 % (ref 11.5–15.5)
WBC: 5.6 10*3/uL (ref 4.0–10.5)

## 2015-12-21 LAB — HEMOGLOBIN A1C: Hgb A1c MFr Bld: 5 % (ref 4.6–6.5)

## 2015-12-21 MED ORDER — AMLODIPINE BESYLATE 5 MG PO TABS: 5.0000 mg | ORAL_TABLET | Freq: Every day | ORAL | 2 refills | Status: DC

## 2015-12-21 NOTE — Assessment & Plan Note (Signed)
Chronic, recurrent. CBC and BMET today. Has appt with GI on 10/25.

## 2015-12-21 NOTE — Progress Notes (Signed)
Pre visit review using our clinic review tool, if applicable. No additional management support is needed unless otherwise documented below in the visit note. 

## 2015-12-21 NOTE — Progress Notes (Signed)
Subjective:   Patient ID: [name] [name], male    DOB: 24-Feb-1971, 45 y.o.   MRN: [id number]  [name] [name] is a pleasant 45 y.o. year old male who presents to clinic today with Hospitalization Follow-up and Medication Refill  on 12/21/2015  HPI:  Admitted to hospital 10/10- 12/16/15. Notes reviewed.  H/o gastric sleeve with previously h/o SBO who presented with abdominal pain and nausea x 1 week.  Decreased frequency of BMs as well.  In ER, CT scan showed features concerning for ileus vs partial SBO vs chronic enteritis. Given IVFs.  Had 2 BMs overnight.  Surgery consulted and felt he had enteritis and less likely SBO given CT findings of lymphadenopathy. Advised follow up with GI as outpatient.  Still having abdominal pain and feeling like he has to have a BM.  HTN- BP has been controlled with Norvasc.  Ct Abdomen Pelvis W Contrast  Result Date: 12/13/2015 CLINICAL DATA:  Initial evaluation for acute abdominal pain with distention and nausea for 1 week. Concern for obstruction. EXAM: CT ABDOMEN AND PELVIS WITH CONTRAST TECHNIQUE: Multidetector CT imaging of the abdomen and pelvis was performed using the standard protocol following bolus administration of intravenous contrast. CONTRAST:  138mL ISOVUE-300 IOPAMIDOL (ISOVUE-300) INJECTION 61% COMPARISON:  Prior CT from 05/31/2015. FINDINGS: Lower chest: Visualized lung bases are clear. No pleural or pericardial fusion. Hepatobiliary: Diffuse hypoattenuation of the liver compatible with steatosis. Liver otherwise unremarkable. Gallbladder surgically absent. No biliary dilatation. Pancreas: Pancreas within normal limits. Spleen: Spleen within normal limits. Adrenals/Urinary Tract: Adrenal glands are normal. Kidneys equal in size with symmetric enhancement. Several cysts noted within the right kidney, largest of which measures 8 cm. No nephrolithiasis, hydronephrosis, or focal enhancing renal mass. Ureters of normal caliber without acute  abnormality. Bladder partially distended and within normal limits. Stomach/Bowel: Sequela prior sleeve gastrectomy noted. Stomach otherwise unremarkable. Few mildly prominent loops of small bowel measuring up to 3.7 cm in diameter with associated air-fluid levels are present within knee mid and left abdomen. No discrete transition point identified. Findings favored to reflect possible adynamic ileus, possibly related to underlying enteritis, although a low grade partial small bowel obstruction not entirely excluded. These findings are similar but slightly less prominent as compared to most recent CT from 05/31/2015. No significant inflammatory changes about the small bowel. Ileum is decompressed without acute abnormality. Colon is decompressed without acute inflammatory changes. Mild sigmoid diverticulosis without evidence for acute diverticulitis. The No findings to suggest acute appendicitis. Vascular/Lymphatic: Normal intravascular enhancement seen throughout the intra-abdominal aorta and its branch vessels. Similar appearance of multiple prominent sub cm mesenteric lymph nodes at the root of the mesenteries, similar to previous. Again, findings may reflect sequela of underlying enteritis. No pathologically enlarged adenopathy within the abdomen and pelvis. Reproductive: Prostate normal. Other: No free air or fluid. Small fat containing paraumbilical hernia noted. Musculoskeletal: No acute osseous abnormality. No worrisome lytic or blastic osseous lesions. IMPRESSION: 1. Few mildly prominent loops of small bowel clustered within the mid and left abdomen with a few associated air-fluid levels. No discrete transition point identified. Findings favored to reflect an adynamic ileus, possibly related to underlying enteritis, although a possible low grade partial obstruction is not entirely excluded. Overall, these findings are similar but less prominent as compared to most recent CT from 05/31/2015. 2. Similar  appearance of mild mesenteric thickening at the root of the mesentery with multiple small subcentimeter lymph nodes. Findings are suspected to be reactive in nature, potentially related to chronic underlying  enteritis, although chronic sclerosing mesenteritis or lymphoproliferative disorder are not entirely excluded. 3. Mild sigmoid diverticulosis without evidence for acute diverticulitis. 4. Status post sleeve gastrectomy. 5. Hepatic steatosis. Electronically Signed   By: Jeannine Boga M.D.   On: 12/13/2015 21:23   Dg Abd 2 Views  Result Date: 12/15/2015 CLINICAL DATA:  Abdominal pain for 2 days, evaluate for blockage EXAM: ABDOMEN - 2 VIEW COMPARISON:  12/13/2015 FINDINGS: There is normal small bowel gas pattern. Some colonic gas noted throughout the colon. IMPRESSION: Normal small bowel gas pattern. Some colonic gas noted throughout the colon. Electronically Signed   By: Lahoma Crocker M.D.   On: 12/15/2015 08:53   Lab Results  Component Value Date   WBC 5.5 12/16/2015   HGB 15.1 12/16/2015   HCT 44.0 12/16/2015   MCV 85.3 12/16/2015   PLT 190 12/16/2015    Current Outpatient Prescriptions on File Prior to Visit  Medication Sig Dispense Refill  . albuterol (ACCUNEB) 0.63 MG/3ML nebulizer solution Take 1 ampule by nebulization every 6 (six) hours as needed for wheezing or shortness of breath.    Marland Kitchen albuterol (PROVENTIL HFA;VENTOLIN HFA) 108 (90 Base) MCG/ACT inhaler Inhale 2 puffs into the lungs every 6 (six) hours as needed for wheezing or shortness of breath. 1 Inhaler 0  . omeprazole (PRILOSEC) 20 MG capsule TAKE ONE CAPSULE BY MOUTH ONCE DAILY (Patient taking differently: TAKE 20 MG BY MOUTH ONCE DAILY) 30 capsule 5  . traMADol (ULTRAM) 50 MG tablet Take 1 tablet (50 mg total) by mouth every 6 (six) hours as needed. 10 tablet 0   No current facility-administered medications on file prior to visit.     Allergies  Allergen Reactions  . Bee Venom Anaphylaxis  . Codeine Anaphylaxis      Tolerates dilaudid  . Hornet Venom Anaphylaxis  . Penicillins Hives and Other (See Comments)    Has patient had a PCN reaction causing immediate rash, facial/tongue/throat swelling, SOB or lightheadedness with hypotensionYES Has patient had a PCN reaction causing severe rash involving mucus membranes or skin necrosis: NO Has patient had a PCN reaction that required hospitalization NO Has patient had a PCN reaction occurring within the last 10 years: NO If all of the above answers are "NO", then may proceed with Cephalosporin use.    Past Medical History:  Diagnosis Date  . Asthma   . Chronic bronchitis ([place])   . GERD (gastroesophageal reflux disease)   . Hypertension   . Obesity   . Sleep apnea    "suppose to wear mask but I don't" (05/31/2015)    Past Surgical History:  Procedure Laterality Date  . CHOLECYSTECTOMY N/A 02/14/2015   Procedure: LAPAROSCOPIC CHOLECYSTECTOMY WITH INTRAOPERATIVE CHOLANGIOGRAM;  Surgeon: Coralie Keens, MD;  Location: [facility];  Service: General;  Laterality: N/A;  . COLONOSCOPY  10/16/2005   Polyps (tubular adenoma 2007), external hemorrhoids  . LAPAROSCOPIC GASTRIC SLEEVE RESECTION  10/2010    at [facility] History  Problem Relation Age of Onset  . Diabetes Mellitus II Mother   . Breast cancer Mother   . Diabetes Mellitus II Father   . CAD Father   . Stroke Father   . Cancer Father   . Cirrhosis Maternal Grandmother     Social History   Social History  . Marital status: Married    Spouse name: N/A  . Number of children: 1  . Years of education: N/A   Occupational History  . corrections officer [employer]  Social History Main Topics  . Smoking status: Former Smoker    Packs/day: 3.00    Years: 0.00    Types: Cigarettes    Quit date: 01/13/2008  . Smokeless tobacco: Current User    Types: Chew, Snuff  . Alcohol use 7.8 oz/week    13 Shots of liquor per week     Comment: 10/11//2017 "1 pint of liquor/wk"  . Drug  use: No  . Sexual activity: Yes   Other Topics Concern  . Not on file   Social History Narrative  . No narrative on file   The PMH, PSH, Social History, Family History, Medications, and allergies have been reviewed in [facility], and have been updated if relevant.   Review of Systems  Constitutional: Negative.   HENT: Negative.   Gastrointestinal: Positive for abdominal pain. Negative for anal bleeding, blood in stool, constipation, diarrhea, nausea, rectal pain and vomiting.  Genitourinary: Negative.   Neurological: Negative.   Psychiatric/Behavioral: Negative.   All other systems reviewed and are negative.      Objective:    BP 138/88   Pulse 86   Temp 97.5 F (36.4 C) (Oral)   Wt (!) 401 lb (181.9 kg)   SpO2 96%   BMI 51.49 kg/m   Wt Readings from Last 3 Encounters:  12/21/15 (!) 401 lb (181.9 kg)  12/14/15 (!) 398 lb 4.8 oz (180.7 kg)  10/19/15 (!) 440 lb (199.6 kg)    Physical Exam  Constitutional: He is oriented to person, place, and time. He appears well-developed and well-nourished. No distress.  HENT:  Head: Normocephalic.  Eyes: Conjunctivae are normal.  Cardiovascular: Normal rate.   Pulmonary/Chest: Effort normal.  Abdominal: Soft. He exhibits no distension and no mass. There is tenderness. There is no rebound and no guarding.  Musculoskeletal: Normal range of motion.  Neurological: He is alert and oriented to person, place, and time. No cranial nerve deficit.  Skin: Skin is warm and dry. He is not diaphoretic.  Psychiatric: He has a normal mood and affect. His behavior is normal. Judgment and thought content normal.  Nursing note and vitals reviewed.         Assessment & Plan:   Essential hypertension  Enteritis - Plan: Basic metabolic panel, CBC with Differential/Platelet, Ambulatory referral to Gastroenterology No Follow-up on file.

## 2015-12-21 NOTE — Assessment & Plan Note (Signed)
Well controlled.  No changes made. 

## 2015-12-26 ENCOUNTER — Encounter: Payer: Self-pay | Admitting: Primary Care

## 2015-12-26 ENCOUNTER — Ambulatory Visit (INDEPENDENT_AMBULATORY_CARE_PROVIDER_SITE_OTHER): Payer: BC Managed Care – PPO | Admitting: Primary Care

## 2015-12-26 VITALS — BP 138/86 | HR 73 | Temp 97.7°F | Wt >= 6400 oz

## 2015-12-26 DIAGNOSIS — B084 Enteroviral vesicular stomatitis with exanthem: Secondary | ICD-10-CM

## 2015-12-26 NOTE — Progress Notes (Signed)
Subjective:    Patient ID: [name] [name], male    DOB: 01-10-1971, 45 y.o.   MRN: [id number]  HPI  [name] [name] is a 45 year old male who presents today with a chief complaint of rash. His daughter was diagnosed with Hand, Foot, Mouth Disease Thursday last week. His rash is located to his hands, feet, and to his throat which he first noticed Friday last week. He reports sharp, persistent pain to the rash on his hands and feet. He denies fevers, cough, shortness of breath. He's taken oxycodone from an old prescription and tylenol cold and flu with some improvement.  Review of Systems  Constitutional: Negative for chills and fever.  HENT: Positive for sore throat. Negative for congestion.   Respiratory: Negative for cough and shortness of breath.   Skin: Positive for rash.       Past Medical History:  Diagnosis Date  . Asthma   . Chronic bronchitis ([place])   . GERD (gastroesophageal reflux disease)   . Hypertension   . Obesity   . Sleep apnea    "suppose to wear mask but I don't" (05/31/2015)     Social History   Social History  . Marital status: Married    Spouse name: N/A  . Number of children: 1  . Years of education: N/A   Occupational History  . corrections officer [employer]   Social History Main Topics  . Smoking status: Former Smoker    Packs/day: 3.00    Years: 0.00    Types: Cigarettes    Quit date: 01/13/2008  . Smokeless tobacco: Current User    Types: Chew, Snuff  . Alcohol use 7.8 oz/week    13 Shots of liquor per week     Comment: 10/11//2017 "1 pint of liquor/wk"  . Drug use: No  . Sexual activity: Yes   Other Topics Concern  . Not on file   Social History Narrative  . No narrative on file    Past Surgical History:  Procedure Laterality Date  . CHOLECYSTECTOMY N/A 02/14/2015   Procedure: LAPAROSCOPIC CHOLECYSTECTOMY WITH INTRAOPERATIVE CHOLANGIOGRAM;  Surgeon: Coralie Keens, MD;  Location: [place];  Service: General;  Laterality:  N/A;  . COLONOSCOPY  10/16/2005   Polyps (tubular adenoma 2007), external hemorrhoids  . LAPAROSCOPIC GASTRIC SLEEVE RESECTION  10/2010    at [facility] History  Problem Relation Age of Onset  . Diabetes Mellitus II Mother   . Breast cancer Mother   . Diabetes Mellitus II Father   . CAD Father   . Stroke Father   . Cancer Father   . Cirrhosis Maternal Grandmother     Allergies  Allergen Reactions  . Bee Venom Anaphylaxis  . Codeine Anaphylaxis    Tolerates dilaudid  . Hornet Venom Anaphylaxis  . Penicillins Hives and Other (See Comments)    Has patient had a PCN reaction causing immediate rash, facial/tongue/throat swelling, SOB or lightheadedness with hypotensionYES Has patient had a PCN reaction causing severe rash involving mucus membranes or skin necrosis: NO Has patient had a PCN reaction that required hospitalization NO Has patient had a PCN reaction occurring within the last 10 years: NO If all of the above answers are "NO", then may proceed with Cephalosporin use.    Current Outpatient Prescriptions on File Prior to Visit  Medication Sig Dispense Refill  . albuterol (ACCUNEB) 0.63 MG/3ML nebulizer solution Take 1 ampule by nebulization every 6 (six) hours as needed for wheezing or  shortness of breath.    Marland Kitchen albuterol (PROVENTIL HFA;VENTOLIN HFA) 108 (90 Base) MCG/ACT inhaler Inhale 2 puffs into the lungs every 6 (six) hours as needed for wheezing or shortness of breath. 1 Inhaler 0  . amLODipine (NORVASC) 5 MG tablet Take 1 tablet (5 mg total) by mouth daily. 90 tablet 2  . omeprazole (PRILOSEC) 20 MG capsule TAKE ONE CAPSULE BY MOUTH ONCE DAILY (Patient taking differently: TAKE 20 MG BY MOUTH ONCE DAILY) 30 capsule 5  . traMADol (ULTRAM) 50 MG tablet Take 1 tablet (50 mg total) by mouth every 6 (six) hours as needed. (Patient not taking: Reported on 12/26/2015) 10 tablet 0   No current facility-administered medications on file prior to visit.     BP 138/86    Pulse 73   Temp 97.7 F (36.5 C) (Oral)   Wt (!) 400 lb (181.4 kg)   SpO2 98%   BMI 51.36 kg/m    Objective:   Physical Exam  Constitutional: He appears well-nourished.  HENT:  Right Ear: Tympanic membrane and ear canal normal.  Left Ear: Tympanic membrane and ear canal normal.  Nose: No mucosal edema. Right sinus exhibits no maxillary sinus tenderness and no frontal sinus tenderness. Left sinus exhibits no maxillary sinus tenderness and no frontal sinus tenderness.  Rash representative of hand foot mouth disease present to posterior pharynx.  Eyes: Conjunctivae are normal.  Neck: Neck supple.  Cardiovascular: Normal rate and regular rhythm.   Pulmonary/Chest: Effort normal and breath sounds normal. He has no wheezes. He has no rales.  Skin: Skin is warm and dry.  Rash representative of hand-foot-and-mouth disease present to the lateral anterior hands and dorsal feet.          Assessment & Plan:  Hand-foot-and-mouth disease:  Rash to palms of hands, feet, posterior pharynx since Friday last week. Daughter recently diagnosed with hand-foot-and-mouth disease last week. Exam today with rash present to hands, feet, posterior pharynx that is representative of the disease. Long discussion today regarding treatment which is supportive/comfort. Discussed ibuprofen use and importance of hand hygiene. Work note provided as this is highly contagious. Discussed to notify if no improvement in symptoms within 3-5 days.  Sheral Flow, NP

## 2015-12-26 NOTE — Patient Instructions (Signed)
Your symptoms will improve within 3-5 days from onset.  I recommend ibuprofen 600 mg three times daily for pain from blisters.  You must wash your hands regularly to prevent spread.  You may return to work on Friday this week if blisters have resolved.  It was a pleasure to see you today!

## 2015-12-26 NOTE — Progress Notes (Signed)
Pre visit review using our clinic review tool, if applicable. No additional management support is needed unless otherwise documented below in the visit note. 

## 2015-12-28 ENCOUNTER — Ambulatory Visit (INDEPENDENT_AMBULATORY_CARE_PROVIDER_SITE_OTHER): Payer: BC Managed Care – PPO | Admitting: Gastroenterology

## 2015-12-28 ENCOUNTER — Encounter: Payer: Self-pay | Admitting: Gastroenterology

## 2015-12-28 VITALS — BP 140/90 | HR 96 | Ht 73.25 in | Wt >= 6400 oz

## 2015-12-28 DIAGNOSIS — Z860100 Personal history of colon polyps, unspecified: Secondary | ICD-10-CM

## 2015-12-28 DIAGNOSIS — R933 Abnormal findings on diagnostic imaging of other parts of digestive tract: Secondary | ICD-10-CM | POA: Diagnosis not present

## 2015-12-28 DIAGNOSIS — K56609 Unspecified intestinal obstruction, unspecified as to partial versus complete obstruction: Secondary | ICD-10-CM

## 2015-12-28 HISTORY — DX: Personal history of colonic polyps: Z86.010

## 2015-12-28 HISTORY — DX: Abnormal findings on diagnostic imaging of other parts of digestive tract: R93.3

## 2015-12-28 HISTORY — DX: Personal history of colon polyps, unspecified: Z86.0100

## 2015-12-28 MED ORDER — NA SULFATE-K SULFATE-MG SULF 17.5-3.13-1.6 GM/177ML PO SOLN: 1.0000 | Freq: Once | ORAL | 0 refills | Status: AC

## 2015-12-28 NOTE — Progress Notes (Signed)
I agree with the above note, plan 

## 2015-12-28 NOTE — Progress Notes (Signed)
12/28/2015 [name] [name] [id number] Jul 12, 1970   HISTORY OF PRESENT ILLNESS:  This is a 45 year old male who is known to Dr. Ardis Hughs for colonoscopy back in August 2007 at which time he had external hemorrhoids as well as a colon polyp removed that was a tubular adenoma. Repeat was recommended in 5 years from that time, but he did not follow through with that study. Now he is here today for hospital follow-up, which was recommended by the surgeons and the hospitalist as well as his PCP, Dr. Deborra Medina (she actually referred him here).  He was recently hospitalized for a partial small bowel obstruction versus an ileus. CAT scan questions enteritis with lymphadenopathy. He has similar episode back in March of this year as well as in May 2016.  All 3 CT scans have shown similar findings with the most recent one stating the following:  "IMPRESSION: 1. Few mildly prominent loops of small bowel clustered within the mid and left abdomen with a few associated air-fluid levels. No discrete transition point identified. Findings favored to reflect an adynamic ileus, possibly related to underlying enteritis, although a possible low grade partial obstruction is not entirely excluded. Overall, these findings are similar but less prominent as compared to most recent CT from 05/31/2015. 2. Similar appearance of mild mesenteric thickening at the root of the mesentery with multiple small subcentimeter lymph nodes. Findings are suspected to be reactive in nature, potentially related to chronic underlying enteritis, although chronic sclerosing mesenteritis or lymphoproliferative disorder are not entirely excluded. 3. Mild sigmoid diverticulosis without evidence for acute diverticulitis. 4. Status post sleeve gastrectomy. 5. Hepatic steatosis."  Says that between episodes he feels ok but still has some discomfort at times.  Bowel habits are erratic, sometimes loose/diarrhea and sometimes no BM for a day or  so.  Sees bright red blood on occasion but contributes that to his hemorrhoids.  CBC, CMP, lipase normal.  Has history of gastric sleeve and cholecystectomy.   Past Medical History:  Diagnosis Date  . Asthma   . Bronchitis   . Chronic bronchitis ([place])   . Gallstones   . GERD (gastroesophageal reflux disease)   . Hypertension   . Obesity   . Sleep apnea    "suppose to wear mask but I don't" (05/31/2015)   Past Surgical History:  Procedure Laterality Date  . CHOLECYSTECTOMY N/A 02/14/2015   Procedure: LAPAROSCOPIC CHOLECYSTECTOMY WITH INTRAOPERATIVE CHOLANGIOGRAM;  Surgeon: Coralie Keens, MD;  Location: [place];  Service: General;  Laterality: N/A;  . COLONOSCOPY  10/16/2005   Polyps (tubular adenoma 2007), external hemorrhoids  . FINGER SURGERY Left    reconstructive   . LAPAROSCOPIC GASTRIC SLEEVE RESECTION  10/2010    at [facility]    reports that he quit smoking about 7 years ago. His smoking use included Cigarettes. He smoked 3.00 packs per day for 0.00 years. His smokeless tobacco use includes Chew and Snuff. He reports that he drinks about 7.8 oz of alcohol per week . He reports that he does not use drugs. family history includes Breast cancer in his maternal grandmother and mother; CAD in his father; Cancer in his father; Cirrhosis in his maternal grandmother; Clotting disorder in his father; Diabetes Mellitus II in his father and mother; Ovarian cancer in his mother; Stroke in his father. Allergies  Allergen Reactions  . Bee Venom Anaphylaxis  . Codeine Anaphylaxis    Tolerates dilaudid  . Hornet Venom Anaphylaxis  . Penicillins Hives and Other (See Comments)  Has patient had a PCN reaction causing immediate rash, facial/tongue/throat swelling, SOB or lightheadedness with hypotensionYES Has patient had a PCN reaction causing severe rash involving mucus membranes or skin necrosis: NO Has patient had a PCN reaction that required hospitalization NO Has patient had a PCN reaction  occurring within the last 10 years: NO If all of the above answers are "NO", then may proceed with Cephalosporin use.      Outpatient Encounter Prescriptions as of 12/28/2015  Medication Sig  . albuterol (ACCUNEB) 0.63 MG/3ML nebulizer solution Take 1 ampule by nebulization every 6 (six) hours as needed for wheezing or shortness of breath.  Marland Kitchen albuterol (PROVENTIL HFA;VENTOLIN HFA) 108 (90 Base) MCG/ACT inhaler Inhale 2 puffs into the lungs every 6 (six) hours as needed for wheezing or shortness of breath.  Marland Kitchen amLODipine (NORVASC) 5 MG tablet Take 1 tablet (5 mg total) by mouth daily.  Marland Kitchen omeprazole (PRILOSEC) 20 MG capsule TAKE ONE CAPSULE BY MOUTH ONCE DAILY (Patient taking differently: TAKE 20 MG BY MOUTH ONCE DAILY)  . HYDROcodone-acetaminophen (NORCO/VICODIN) 5-325 MG tablet Take 1 tablet by mouth every 6 (six) hours as needed for moderate pain.  . Na Sulfate-K Sulfate-Mg Sulf 17.5-3.13-1.6 GM/180ML SOLN Take 1 kit by mouth once.  Marland Kitchen oxyCODONE-acetaminophen (PERCOCET/ROXICET) 5-325 MG tablet Take 1 tablet by mouth as needed for severe pain.  . traMADol (ULTRAM) 50 MG tablet Take 1 tablet (50 mg total) by mouth every 6 (six) hours as needed. (Patient not taking: Reported on 12/28/2015)   No facility-administered encounter medications on file as of 12/28/2015.      REVIEW OF SYSTEMS  : All other systems reviewed and negative except where noted in the History of Present Illness.   PHYSICAL EXAM: BP 140/90 (BP Location: Left Arm, Patient Position: Sitting, Cuff Size: Large)   Pulse 96   Ht 6' 1.25" (1.861 m) Comment: height measured without shoes  Wt (!) 400 lb (181.4 kg)   BMI 52.41 kg/m  General: Well developed white male in no acute distress Head: Normocephalic and atraumatic Eyes:  Sclerae anicteric, conjunctiva pink. Ears: Normal auditory acuity Lungs: Clear throughout to auscultation Heart: Regular rate and rhythm Abdomen: Soft, non-distended.  BS present.  Mild diffuse  TTP. Rectal:  Will be done at the time of colonoscopy. Musculoskeletal: Symmetrical with no gross deformities  Skin: No lesions on visible extremities Extremities: No edema  Neurological: Alert oriented x 4, grossly non-focal Psychological:  Alert and cooperative. Normal mood and affect  ASSESSMENT AND PLAN: -Personal history of colon polyps:  Had adenomatous polyps in 10/2005 with no repeat colonoscopy since that time.  Will schedule with Dr. Ardis Hughs at [facility] hospital due to BMI >50.  The risks, benefits, and alternatives to colonoscopy were discussed with the patient and he consents to proceed.  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  I think that this last episode should be followed up with a small bowel series to rule out any ongoing signs of enteritis that could indicate small bowel Crohn's disease, etc.  CC:  Lucille Passy, MD

## 2015-12-28 NOTE — Patient Instructions (Addendum)
You have been scheduled for a colonoscopy. Please follow written instructions given to you at your visit today.  Please pick up your prep supplies at the pharmacy within the next 1-3 days. [name] [name].  If you use inhalers (even only as needed), please bring them with you on the day of your procedure. Your physician has requested that you go to www.startemmi.com and enter the access code given to you at your visit today. This web site gives a general overview about your procedure. However, you should still follow specific instructions given to you by our office regarding your preparation for the procedure.   You have been scheduled for an Upper GI Series and Small Bowel Follow Thru at [facility]. Your appointment is on 01-09-2016 at 10:30 am. Please arrive at 10:15 minutes prior to your test for registration. Make certain not to have anything to eat or drink after midnight on the night before your test. If you need to reschedule, please contact radiology at [phone]. --------------------------------------------------------------------------------------------------------------- An upper GI series uses x rays to help diagnose problems of the upper GI tract, which includes the esophagus, stomach, and duodenum. The duodenum is the first part of the small intestine. An upper GI series is conducted by a radiology technologist or a radiologist-a doctor who specializes in x-ray imaging-at a hospital or outpatient center. While sitting or standing in front of an x-ray machine, the patient drinks barium liquid, which is often white and has a chalky consistency and taste. The barium liquid coats the lining of the upper GI tract and makes signs of disease show up more clearly on x rays. X-ray video, called fluoroscopy, is used to view the barium liquid moving through the esophagus, stomach, and duodenum. Additional x rays and fluoroscopy are performed while the patient lies on an x-ray  table. To fully coat the upper GI tract with barium liquid, the technologist or radiologist may press on the abdomen or ask the patient to change position. Patients hold still in various positions, allowing the technologist or radiologist to take x rays of the upper GI tract at different angles. If a technologist conducts the upper GI series, a radiologist will later examine the images to look for problems.  This test typically takes about 1 hour to complete --------------------------------------------------------------------------------------------------------------------------------------------- The Small Bowel Follow Thru examination is used to visualize the entire small bowel (intestines); specifically the connection between the small and large intestine. You will be positioned on a flat x-ray table and an image of your abdomen taken. Then the technologist will show the x-ray to the radiologist. The radiologist will instruct your technologist how much (1-2 cups) barium sulfate you will drink and when to begin taking the timed x-rays, usually 15-30 minutes after you begin drinking. Barium is a harmless substance that will highlight your small intestine by absorbing x-ray. The taste is chalky and it feels very heavy both in the cup and in your stomach.  After the first x-ray is taken and shown to the radiologist, he/she will determine when the next image is to be taken. This is repeated until the barium has reached the end of the small intestine and enters the beginning of the colon (cecum). At such time when the barium spills into the colon, you will be positioned on the x-ray table once again. The radiologist will use a fluoroscopic camera to take some detailed pictures of the connection between your small intestine and colon. The fluoroscope is an x-ray unit that works  with a television/computer screen. The radiologist will apply pressure to your abdomen with his/her hand and a lead glove, a plastic paddle,  or a paddle with an inflated rubber balloon on the end. This is to spread apart your loops of intestine so he/she can see all areas.   This test typically takes around 1 hour to complete.  Drink plenty of water (8-10 cups/day) for a few days following the procedure to avoid constipation and blockage. The barium will make your stools white for a few days. --------------------------------------------------------------------------------------------------------------------------------------------

## 2015-12-30 ENCOUNTER — Encounter (HOSPITAL_COMMUNITY): Payer: Self-pay | Admitting: *Deleted

## 2016-01-05 ENCOUNTER — Ambulatory Visit (HOSPITAL_COMMUNITY): Payer: BC Managed Care – PPO | Admitting: Anesthesiology

## 2016-01-05 ENCOUNTER — Encounter (HOSPITAL_COMMUNITY): Payer: Self-pay

## 2016-01-05 ENCOUNTER — Ambulatory Visit (HOSPITAL_COMMUNITY)
Admission: RE | Admit: 2016-01-05 | Discharge: 2016-01-05 | Disposition: A | Discharge status: Home / Self Care | Payer: BC Managed Care – PPO | Source: Ambulatory Visit | Attending: Gastroenterology | Admitting: Gastroenterology

## 2016-01-05 ENCOUNTER — Encounter (HOSPITAL_COMMUNITY)
Admission: RE | Disposition: A | Discharge status: Home / Self Care | Payer: Self-pay | Source: Ambulatory Visit | Attending: Gastroenterology

## 2016-01-05 DIAGNOSIS — Z9049 Acquired absence of other specified parts of digestive tract: Secondary | ICD-10-CM | POA: Insufficient documentation

## 2016-01-05 DIAGNOSIS — K56609 Unspecified intestinal obstruction, unspecified as to partial versus complete obstruction: Secondary | ICD-10-CM

## 2016-01-05 DIAGNOSIS — Z1211 Encounter for screening for malignant neoplasm of colon: Secondary | ICD-10-CM | POA: Insufficient documentation

## 2016-01-05 DIAGNOSIS — Z9884 Bariatric surgery status: Secondary | ICD-10-CM | POA: Insufficient documentation

## 2016-01-05 DIAGNOSIS — Z86010 Personal history of colonic polyps: Secondary | ICD-10-CM | POA: Diagnosis not present

## 2016-01-05 DIAGNOSIS — G4730 Sleep apnea, unspecified: Secondary | ICD-10-CM | POA: Diagnosis not present

## 2016-01-05 DIAGNOSIS — D124 Benign neoplasm of descending colon: Secondary | ICD-10-CM | POA: Diagnosis not present

## 2016-01-05 DIAGNOSIS — Z79899 Other long term (current) drug therapy: Secondary | ICD-10-CM | POA: Diagnosis not present

## 2016-01-05 DIAGNOSIS — Z6841 Body Mass Index (BMI) 40.0 and over, adult: Secondary | ICD-10-CM | POA: Diagnosis not present

## 2016-01-05 DIAGNOSIS — K760 Fatty (change of) liver, not elsewhere classified: Secondary | ICD-10-CM | POA: Insufficient documentation

## 2016-01-05 DIAGNOSIS — K5730 Diverticulosis of large intestine without perforation or abscess without bleeding: Secondary | ICD-10-CM | POA: Insufficient documentation

## 2016-01-05 DIAGNOSIS — I10 Essential (primary) hypertension: Secondary | ICD-10-CM | POA: Diagnosis not present

## 2016-01-05 DIAGNOSIS — Z880 Allergy status to penicillin: Secondary | ICD-10-CM | POA: Insufficient documentation

## 2016-01-05 DIAGNOSIS — Z87891 Personal history of nicotine dependence: Secondary | ICD-10-CM | POA: Diagnosis not present

## 2016-01-05 DIAGNOSIS — K219 Gastro-esophageal reflux disease without esophagitis: Secondary | ICD-10-CM | POA: Insufficient documentation

## 2016-01-05 HISTORY — DX: Adverse effect of unspecified anesthetic, initial encounter: T41.45XA

## 2016-01-05 HISTORY — DX: Other complications of anesthesia, initial encounter: T88.59XA

## 2016-01-05 HISTORY — PX: COLONOSCOPY WITH PROPOFOL: SHX5780

## 2016-01-05 SURGERY — COLONOSCOPY WITH PROPOFOL
Anesthesia: Monitor Anesthesia Care

## 2016-01-05 MED ORDER — PROPOFOL 500 MG/50ML IV EMUL
INTRAVENOUS | Status: DC | PRN
  Administered 2016-01-05: 30 mg via INTRAVENOUS
  Administered 2016-01-05: 60 mg via INTRAVENOUS

## 2016-01-05 MED ORDER — LACTATED RINGERS IV SOLN
INTRAVENOUS | Status: DC | PRN
  Administered 2016-01-05: 12:00:00 via INTRAVENOUS

## 2016-01-05 MED ORDER — PROPOFOL 500 MG/50ML IV EMUL
INTRAVENOUS | Status: DC | PRN
  Administered 2016-01-05: 125 ug/kg/min via INTRAVENOUS

## 2016-01-05 MED ORDER — SODIUM CHLORIDE 0.9 % IV SOLN: INTRAVENOUS | Status: DC

## 2016-01-05 SURGICAL SUPPLY — 22 items

## 2016-01-05 NOTE — Interval H&P Note (Signed)
History and Physical Interval Note:  01/05/2016 11:30 AM  [name] [name]  has presented today for surgery, with the diagnosis of History of colon polyps Recirrent small bowel obstruction  The various methods of treatment have been discussed with the patient and family. After consideration of risks, benefits and other options for treatment, the patient has consented to  Procedure(s): COLONOSCOPY WITH PROPOFOL (N/A) as a surgical intervention .  The patient's history has been reviewed, patient examined, no change in status, stable for surgery.  I have reviewed the patient's chart and labs.  Questions were answered to the patient's satisfaction.     Milus Banister

## 2016-01-05 NOTE — Interval H&P Note (Signed)
History and Physical Interval Note:  01/05/2016 10:52 AM  [name] [name]  has presented today for surgery, with the diagnosis of History of colon polyps Recirrent small bowel obstruction  The various methods of treatment have been discussed with the patient and family. After consideration of risks, benefits and other options for treatment, the patient has consented to  Procedure(s): COLONOSCOPY WITH PROPOFOL (N/A) as a surgical intervention .  The patient's history has been reviewed, patient examined, no change in status, stable for surgery.  I have reviewed the patient's chart and labs.  Questions were answered to the patient's satisfaction.     Milus Banister

## 2016-01-05 NOTE — Anesthesia Postprocedure Evaluation (Signed)
Anesthesia Post Note  Patient: [name] [name]  Procedure(s) Performed: Procedure(s) (LRB): COLONOSCOPY WITH PROPOFOL (N/A)  Patient location during evaluation: PACU Anesthesia Type: MAC Level of consciousness: awake and alert Pain management: pain level controlled Vital Signs Assessment: post-procedure vital signs reviewed and stable Respiratory status: spontaneous breathing Cardiovascular status: stable Anesthetic complications: no    Last Vitals:  Vitals:   01/05/16 1240 01/05/16 1250  BP: (!) 152/97 (!) 149/105  Pulse: 73 68  Resp: 18 16  Temp:      Last Pain:  Vitals:   01/05/16 1226  TempSrc: Oral                 Nolon Nations

## 2016-01-05 NOTE — Transfer of Care (Signed)
Immediate Anesthesia Transfer of Care Note  Patient: [name] [name]  Procedure(s) Performed: Procedure(s): COLONOSCOPY WITH PROPOFOL (N/A)  Patient Location: PACU  Anesthesia Type:MAC  Level of Consciousness: sedated, patient cooperative and responds to stimulation  Airway & Oxygen Therapy: Patient Spontanous Breathing and Patient connected to face mask oxygen  Post-op Assessment: Report given to RN and Post -op Vital signs reviewed and stable  Post vital signs: Reviewed and stable  Last Vitals:  Vitals:   01/05/16 1015  BP: (!) 182/95  Pulse: 85  Resp: 18    Last Pain:  Vitals:   01/05/16 1015  TempSrc: Oral         Complications: No apparent anesthesia complications

## 2016-01-05 NOTE — Discharge Instructions (Signed)

## 2016-01-05 NOTE — Anesthesia Preprocedure Evaluation (Signed)
Anesthesia Evaluation  Patient identified by MRN, date of birth, ID band Patient awake    Reviewed: Allergy & Precautions, NPO status , Patient's Chart, lab work & pertinent test results  Airway Mallampati: II  TM Distance: <3 FB Neck ROM: Full    Dental no notable dental hx. (+) Teeth Intact, Dental Advisory Given   Pulmonary asthma , sleep apnea , former smoker,    breath sounds clear to auscultation + decreased breath sounds      Cardiovascular hypertension, Pt. on medications Normal cardiovascular exam Rhythm:Regular Rate:Normal     Neuro/Psych Anxiety negative neurological ROS  negative psych ROS   GI/Hepatic Neg liver ROS, GERD  Medicated and Controlled,  Endo/Other  Morbid obesity  Renal/GU negative Renal ROS  negative genitourinary   Musculoskeletal negative musculoskeletal ROS (+)   Abdominal (+) + obese,   Peds negative pediatric ROS (+)  Hematology negative hematology ROS (+)   Anesthesia Other Findings   Reproductive/Obstetrics negative OB ROS                             Anesthesia Physical  Anesthesia Plan  ASA: III  Anesthesia Plan: MAC   Post-op Pain Management:    Induction: Intravenous  Airway Management Planned:   Additional Equipment:   Intra-op Plan:   Post-operative Plan:   Informed Consent: I have reviewed the patients History and Physical, chart, labs and discussed the procedure including the risks, benefits and alternatives for the proposed anesthesia with the patient or authorized representative who has indicated his/her understanding and acceptance.   Dental advisory given  Plan Discussed with: CRNA, Surgeon and Anesthesiologist  Anesthesia Plan Comments:        Anesthesia Quick Evaluation

## 2016-01-05 NOTE — Op Note (Signed)
[facility] Patient Name: [name] [name] Procedure Date: 01/05/2016 MRN: [id number] Attending MD: Milus Banister , MD Date of Birth: August 24, 1970 CSN: [id number] Age: 45 Admit Type: Outpatient Procedure:                Colonoscopy Indications:              High risk colon cancer surveillance: Personal                            history of colonic polyps (subCM tubular adenoma                            removed 2007 Dr. Ardis Hughs); recent pSBO, ?enterititis                            on imaging. Providers:                Milus Banister, MD, Dortha Schwalbe RN, RN,                            Ralene Bathe, Technician Referring MD:              Medicines:                Monitored Anesthesia Care Complications:            No immediate complications. Estimated blood loss:                            None. Estimated Blood Loss:     Estimated blood loss: none. Procedure:                Pre-Anesthesia Assessment:                           - Prior to the procedure, a History and Physical                            was performed, and patient medications and                            allergies were reviewed. The patient's tolerance of                            previous anesthesia was also reviewed. The risks                            and benefits of the procedure and the sedation                            options and risks were discussed with the patient.                            All questions were answered, and informed consent                            was obtained. Prior Anticoagulants: The  patient has                            taken no previous anticoagulant or antiplatelet                            agents. ASA Grade Assessment: II - A patient with                            mild systemic disease. After reviewing the risks                            and benefits, the patient was deemed in                            satisfactory condition to undergo the procedure.                         After obtaining informed consent, the colonoscope                            was passed under direct vision. Throughout the                            procedure, the patient's blood pressure, pulse, and                            oxygen saturations were monitored continuously. The                            [id number] scope was introduced through                            the anus and advanced to the the terminal ileum.                            The colonoscopy was performed without difficulty.                            The patient tolerated the procedure well. The                            quality of the bowel preparation was excellent. The                            terminal ileum, ileocecal valve, appendiceal                            orifice, and rectum were photographed. Scope In: 12:10:31 PM Scope Out: 12:20:30 PM Scope Withdrawal Time: 0 hours 6 minutes 44 seconds  Total Procedure Duration: 0 hours 9 minutes 59 seconds  Findings:      The terminal ileum appeared normal.      A 5 mm polyp was found in the descending colon. The polyp was sessile.  The polyp was removed with a cold snare. Resection and retrieval were       complete. Estimated blood loss: none.      The exam was otherwise without abnormality. Impression:               - The examined portion of the ileum was normal.                           - One 5 mm polyp in the descending colon, removed                            with a cold snare. Resected and retrieved.                           - The examination was otherwise normal. Moderate Sedation:      N/A- Per Anesthesia Care Recommendation:           - Patient has a contact number available for                            emergencies. The signs and symptoms of potential                            delayed complications were discussed with the                            patient. Return to normal activities tomorrow.                             Written discharge instructions were provided to the                            patient.                           - Resume previous diet.                           - Continue present medications.                           You will receive a letter within 2-3 weeks with the                            pathology results and my final recommendations.                           If the polyp(s) is proven to be 'pre-cancerous' on                            pathology, you will need repeat colonoscopy in 5                            years. If the polyp(s) is NOT 'precancerous' on  pathology then you should repeat colon cancer                            screening in 10 years with colonoscopy without need                            for colon cancer screening by any method prior to                            then (including stool testing). Procedure Code(s):        --- Professional ---                           [phone], Colonoscopy, flexible; with removal of                            tumor(s), polyp(s), or other lesion(s) by snare                            technique Diagnosis Code(s):        --- Professional ---                           Z86.010, Personal history of colonic polyps                           D12.4, Benign neoplasm of descending colon CPT copyright 2016 [employer]. All rights reserved. The codes documented in this report are preliminary and upon coder review may  be revised to meet current compliance requirements. Milus Banister, MD 01/05/2016 12:24:39 PM This report has been signed electronically. Number of Addenda: 0

## 2016-01-05 NOTE — H&P (View-Only) (Signed)
   12/28/2015 [name] [name] [id number] 03/22/1970   HISTORY OF PRESENT ILLNESS:  This is a 45-year-old male who is known to Dr. Jacobs for colonoscopy back in August 2007 at which time he had external hemorrhoids as well as a colon polyp removed that was a tubular adenoma. Repeat was recommended in 5 years from that time, but he did not follow through with that study. Now he is here today for hospital follow-up, which was recommended by the surgeons and the hospitalist as well as his PCP, Dr. Aron (she actually referred him here).  He was recently hospitalized for a partial small bowel obstruction versus an ileus. CAT scan questions enteritis with lymphadenopathy. He has similar episode back in March of this year as well as in May 2016.  All 3 CT scans have shown similar findings with the most recent one stating the following:  "IMPRESSION: 1. Few mildly prominent loops of small bowel clustered within the mid and left abdomen with a few associated air-fluid levels. No discrete transition point identified. Findings favored to reflect an adynamic ileus, possibly related to underlying enteritis, although a possible low grade partial obstruction is not entirely excluded. Overall, these findings are similar but less prominent as compared to most recent CT from 05/31/2015. 2. Similar appearance of mild mesenteric thickening at the root of the mesentery with multiple small subcentimeter lymph nodes. Findings are suspected to be reactive in nature, potentially related to chronic underlying enteritis, although chronic sclerosing mesenteritis or lymphoproliferative disorder are not entirely excluded. 3. Mild sigmoid diverticulosis without evidence for acute diverticulitis. 4. Status post sleeve gastrectomy. 5. Hepatic steatosis."  Says that between episodes he feels ok but still has some discomfort at times.  Bowel habits are erratic, sometimes loose/diarrhea and sometimes no BM for a day or  so.  Sees bright red blood on occasion but contributes that to his hemorrhoids.  CBC, CMP, lipase normal.  Has history of gastric sleeve and cholecystectomy.   Past Medical History:  Diagnosis Date  . Asthma   . Bronchitis   . Chronic bronchitis ([facility])   . Gallstones   . GERD (gastroesophageal reflux disease)   . Hypertension   . Obesity   . Sleep apnea    "suppose to wear mask but I don't" (05/31/2015)   Past Surgical History:  Procedure Laterality Date  . CHOLECYSTECTOMY N/A 02/14/2015   Procedure: LAPAROSCOPIC CHOLECYSTECTOMY WITH INTRAOPERATIVE CHOLANGIOGRAM;  Surgeon: Douglas Blackman, MD;  Location: [facility] OR;  Service: General;  Laterality: N/A;  . COLONOSCOPY  10/16/2005   Polyps (tubular adenoma 2007), external hemorrhoids  . FINGER SURGERY Left    reconstructive   . LAPAROSCOPIC GASTRIC SLEEVE RESECTION  10/2010    at [facility]    reports that he quit smoking about 7 years ago. His smoking use included Cigarettes. He smoked 3.00 packs per day for 0.00 years. His smokeless tobacco use includes Chew and Snuff. He reports that he drinks about 7.8 oz of alcohol per week . He reports that he does not use drugs. family history includes Breast cancer in his maternal grandmother and mother; CAD in his father; Cancer in his father; Cirrhosis in his maternal grandmother; Clotting disorder in his father; Diabetes Mellitus II in his father and mother; Ovarian cancer in his mother; Stroke in his father. Allergies  Allergen Reactions  . Bee Venom Anaphylaxis  . Codeine Anaphylaxis    Tolerates dilaudid  . Hornet Venom Anaphylaxis  . Penicillins Hives and Other (See Comments)      Has patient had a PCN reaction causing immediate rash, facial/tongue/throat swelling, SOB or lightheadedness with hypotensionYES Has patient had a PCN reaction causing severe rash involving mucus membranes or skin necrosis: NO Has patient had a PCN reaction that required hospitalization NO Has patient had a PCN reaction  occurring within the last 10 years: NO If all of the above answers are "NO", then may proceed with Cephalosporin use.      Outpatient Encounter Prescriptions as of 12/28/2015  Medication Sig  . albuterol (ACCUNEB) 0.63 MG/3ML nebulizer solution Take 1 ampule by nebulization every 6 (six) hours as needed for wheezing or shortness of breath.  . albuterol (PROVENTIL HFA;VENTOLIN HFA) 108 (90 Base) MCG/ACT inhaler Inhale 2 puffs into the lungs every 6 (six) hours as needed for wheezing or shortness of breath.  . amLODipine (NORVASC) 5 MG tablet Take 1 tablet (5 mg total) by mouth daily.  . omeprazole (PRILOSEC) 20 MG capsule TAKE ONE CAPSULE BY MOUTH ONCE DAILY (Patient taking differently: TAKE 20 MG BY MOUTH ONCE DAILY)  . HYDROcodone-acetaminophen (NORCO/VICODIN) 5-325 MG tablet Take 1 tablet by mouth every 6 (six) hours as needed for moderate pain.  . Na Sulfate-K Sulfate-Mg Sulf 17.5-3.13-1.6 GM/180ML SOLN Take 1 kit by mouth once.  . oxyCODONE-acetaminophen (PERCOCET/ROXICET) 5-325 MG tablet Take 1 tablet by mouth as needed for severe pain.  . traMADol (ULTRAM) 50 MG tablet Take 1 tablet (50 mg total) by mouth every 6 (six) hours as needed. (Patient not taking: Reported on 12/28/2015)   No facility-administered encounter medications on file as of 12/28/2015.      REVIEW OF SYSTEMS  : All other systems reviewed and negative except where noted in the History of Present Illness.   PHYSICAL EXAM: BP 140/90 (BP Location: Left Arm, Patient Position: Sitting, Cuff Size: Large)   Pulse 96   Ht 6' 1.25" (1.861 m) Comment: height measured without shoes  Wt (!) 400 lb (181.4 kg)   BMI 52.41 kg/m  General: Well developed white male in no acute distress Head: Normocephalic and atraumatic Eyes:  Sclerae anicteric, conjunctiva pink. Ears: Normal auditory acuity Lungs: Clear throughout to auscultation Heart: Regular rate and rhythm Abdomen: Soft, non-distended.  BS present.  Mild diffuse  TTP. Rectal:  Will be done at the time of colonoscopy. Musculoskeletal: Symmetrical with no gross deformities  Skin: No lesions on visible extremities Extremities: No edema  Neurological: Alert oriented x 4, grossly non-focal Psychological:  Alert and cooperative. Normal mood and affect  ASSESSMENT AND PLAN: -Personal history of colon polyps:  Had adenomatous polyps in 10/2005 with no repeat colonoscopy since that time.  Will schedule with Dr. Jacobs at [facility] due to BMI >50.  The risks, benefits, and alternatives to colonoscopy were discussed with the patient and he consents to proceed.  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  I think that this last episode should be followed up with a small bowel series to rule out any ongoing signs of enteritis that could indicate small bowel Crohn's disease, etc.  CC:  Aron, Talia M, MD   

## 2016-01-06 ENCOUNTER — Encounter (HOSPITAL_COMMUNITY): Payer: Self-pay | Admitting: Gastroenterology

## 2016-01-09 ENCOUNTER — Ambulatory Visit (HOSPITAL_COMMUNITY)
Admission: RE | Admit: 2016-01-09 | Discharge: 2016-01-09 | Disposition: A | Discharge status: Home / Self Care | Payer: BC Managed Care – PPO | Source: Ambulatory Visit | Attending: Gastroenterology | Admitting: Gastroenterology

## 2016-01-09 ENCOUNTER — Encounter (HOSPITAL_COMMUNITY): Payer: Self-pay

## 2016-01-09 DIAGNOSIS — K56609 Unspecified intestinal obstruction, unspecified as to partial versus complete obstruction: Secondary | ICD-10-CM

## 2016-01-30 ENCOUNTER — Ambulatory Visit (INDEPENDENT_AMBULATORY_CARE_PROVIDER_SITE_OTHER): Payer: BC Managed Care – PPO | Admitting: Family Medicine

## 2016-01-30 ENCOUNTER — Encounter: Payer: Self-pay | Admitting: Family Medicine

## 2016-01-30 VITALS — BP 132/74 | HR 91 | Temp 98.2°F | Wt 394.8 lb

## 2016-01-30 DIAGNOSIS — K529 Noninfective gastroenteritis and colitis, unspecified: Secondary | ICD-10-CM | POA: Diagnosis not present

## 2016-01-30 MED ORDER — ONDANSETRON HCL 4 MG PO TABS: 4.0000 mg | ORAL_TABLET | Freq: Three times a day (TID) | ORAL | 0 refills | Status: DC | PRN

## 2016-01-30 NOTE — Progress Notes (Signed)
Pre visit review using our clinic review tool, if applicable. No additional management support is needed unless otherwise documented below in the visit note. 

## 2016-01-30 NOTE — Progress Notes (Signed)
[name]) [name] [name] is a 45 y.o. male with complaint of gastrointestinal symptoms of watery diarrhea, lower abdominal cramps, nausea, vomiting for 2 days. No blood in stool.  Current Outpatient Prescriptions on File Prior to Visit  Medication Sig Dispense Refill  . albuterol (ACCUNEB) 0.63 MG/3ML nebulizer solution Take 1 ampule by nebulization every 6 (six) hours as needed for wheezing or shortness of breath.    Marland Kitchen albuterol (PROVENTIL HFA;VENTOLIN HFA) 108 (90 Base) MCG/ACT inhaler Inhale 2 puffs into the lungs every 6 (six) hours as needed for wheezing or shortness of breath. 1 Inhaler 0  . amLODipine (NORVASC) 5 MG tablet Take 1 tablet (5 mg total) by mouth daily. 90 tablet 2  . omeprazole (PRILOSEC) 20 MG capsule TAKE ONE CAPSULE BY MOUTH ONCE DAILY (Patient taking differently: TAKE 20 MG BY MOUTH ONCE DAILY) 30 capsule 5  . oxyCODONE-acetaminophen (PERCOCET/ROXICET) 5-325 MG tablet Take 1 tablet by mouth as needed for severe pain.     No current facility-administered medications on file prior to visit.     Allergies  Allergen Reactions  . Bee Venom Anaphylaxis  . Codeine Anaphylaxis    Tolerates dilaudid  . Hornet Venom Anaphylaxis  . Penicillins Hives and Other (See Comments)    Has patient had a PCN reaction causing immediate rash, facial/tongue/throat swelling, SOB or lightheadedness with hypotensionYES Has patient had a PCN reaction causing severe rash involving mucus membranes or skin necrosis: NO Has patient had a PCN reaction that required hospitalization NO Has patient had a PCN reaction occurring within the last 10 years: NO If all of the above answers are "NO", then may proceed with Cephalosporin use.    Past Medical History:  Diagnosis Date  . Asthma   . Bronchitis   . Chronic bronchitis ([place])   . Complication of anesthesia    usually wake up in the middle of procedure or surgery  . Gallstones   . GERD (gastroesophageal reflux disease)   . Hypertension   .  Obesity   . Sleep apnea    "suppose to wear mask but I don't" (05/31/2015)    Past Surgical History:  Procedure Laterality Date  . CHOLECYSTECTOMY N/A 02/14/2015   Procedure: LAPAROSCOPIC CHOLECYSTECTOMY WITH INTRAOPERATIVE CHOLANGIOGRAM;  Surgeon: Coralie Keens, MD;  Location: [place];  Service: General;  Laterality: N/A;  . COLONOSCOPY  10/16/2005   Polyps (tubular adenoma 2007), external hemorrhoids  . COLONOSCOPY WITH PROPOFOL N/A 01/05/2016   Procedure: COLONOSCOPY WITH PROPOFOL;  Surgeon: Milus Banister, MD;  Location: [facility] ENDOSCOPY;  Service: Endoscopy;  Laterality: N/A;  . FINGER SURGERY Left    reconstructive - 2 surgeries on hand  . LAPAROSCOPIC GASTRIC SLEEVE RESECTION  10/2010    at [facility] History  Problem Relation Age of Onset  . Diabetes Mellitus II Mother   . Breast cancer Mother   . Ovarian cancer Mother   . Diabetes Mellitus II Father   . CAD Father   . Stroke Father   . Cancer Father     ?  . Clotting disorder Father     blood clots in brain with surgery  . Cirrhosis Maternal Grandmother   . Breast cancer Maternal Grandmother     Social History   Social History  . Marital status: Married    Spouse name: N/A  . Number of children: 1  . Years of education: N/A   Occupational History  . corrections officer [employer]   Social History Main Topics  .  Smoking status: Former Smoker    Packs/day: 3.00    Years: 0.00    Types: Cigarettes    Quit date: 01/13/2008  . Smokeless tobacco: Current User    Types: Snuff  . Alcohol use 7.8 oz/week    13 Shots of liquor per week     Comment: 10/11//2017 "1 pint of liquor/wk"  . Drug use: No  . Sexual activity: Yes   Other Topics Concern  . Not on file   Social History Narrative  . No narrative on file   The PMH, PSH, Social History, Family History, Medications, and allergies have been reviewed in [facility], and have been updated if relevant.   (O)   BP 132/74   Pulse 91   Temp 98.2 F (36.8  C) (Oral)   Wt (!) 394 lb 12 oz (179.1 kg)   SpO2 97%   BMI 51.73 kg/m   Physical exam reveals the patient appears well. Hydration status: well hydrated. Abdomen: abdomen is soft without significant tenderness, masses, organomegaly or guarding..  (A) Viral Gastroenteritis  (P) I have recommended small amounts clear fluids frequently, soups, juices, water and advance diet as tolerated. Return office visit if symptoms persist or worsen; I have alerted the patient to call if high fever, dehydration, marked weakness, fainting, increased abdominal pain, blood in stool or vomit.

## 2016-01-30 NOTE — Patient Instructions (Signed)

## 2016-03-19 ENCOUNTER — Other Ambulatory Visit: Payer: Self-pay | Admitting: Primary Care

## 2016-03-19 NOTE — Telephone Encounter (Signed)
Ok to refill? Electronically refill request for   omeprazole (PRILOSEC) 20 MG capsule  Last prescribed by [name] on 07/23/2015. Patient was last seen on 01/30/2016

## 2016-03-23 ENCOUNTER — Encounter: Payer: Self-pay | Admitting: Family Medicine

## 2016-03-23 ENCOUNTER — Ambulatory Visit (INDEPENDENT_AMBULATORY_CARE_PROVIDER_SITE_OTHER): Payer: BC Managed Care – PPO | Admitting: Family Medicine

## 2016-03-23 DIAGNOSIS — I10 Essential (primary) hypertension: Secondary | ICD-10-CM

## 2016-03-23 DIAGNOSIS — A084 Viral intestinal infection, unspecified: Secondary | ICD-10-CM | POA: Diagnosis not present

## 2016-03-23 HISTORY — DX: Viral intestinal infection, unspecified: A08.4

## 2016-03-23 NOTE — Progress Notes (Signed)
   Subjective:    Patient ID: [name] [name], male    DOB: 15-Mar-1970, 46 y.o.   MRN: [id number]  HPI    46 year old male with hx of morbid obesity, OSA, HTN and small bowel obstruction presents for upset stomach.   He reports 4 days of diarrhea, low abd pain, fatigue. BMs loose and watery every 4-5 hours.  No fever.  Mild nausea, no vomiting.   He reports last loose BM was last night.  No further BMs, no further low abd pain.  Drinking a lot of fluids.  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   He needs a note for work. Cannot come back unless he has a note.     Review of Systems  Constitutional: Positive for fatigue. Negative for fever.  HENT: Negative for ear pain.   Eyes: Negative for pain.  Respiratory: Negative for shortness of breath.   Cardiovascular: Negative for chest pain.  Gastrointestinal: Positive for diarrhea and nausea. Negative for abdominal distention, blood in stool, constipation and vomiting.       Objective:   Physical Exam  Constitutional: Vital signs are normal. He appears well-developed and well-nourished.  Morbidly obese  HENT:  Head: Normocephalic.  Right Ear: Hearing normal.  Left Ear: Hearing normal.  Nose: Nose normal.  Mouth/Throat: Oropharynx is clear and moist and mucous membranes are normal.  Neck: Trachea normal. Carotid bruit is not present. No thyroid mass and no thyromegaly present.  Cardiovascular: Normal rate, regular rhythm and normal pulses.  Exam reveals no gallop, no distant heart sounds and no friction rub.   No murmur heard. No peripheral edema  Pulmonary/Chest: Effort normal and breath sounds normal. No respiratory distress.  Skin: Skin is warm, dry and  intact. No rash noted.  Psychiatric: He has a normal mood and affect. His speech is normal and behavior is normal. Thought content normal.          Assessment & Plan:

## 2016-03-23 NOTE — Assessment & Plan Note (Signed)
No current fever and diarrhea resolved.  No further abd pain.  He is cleared to go back to work. Not contagious.

## 2016-03-23 NOTE — Assessment & Plan Note (Signed)
BP likely gone up with increase in weight.  he will get back on track with lifestyle change. Will get cuff and follow BP at home.. Record and follow up with PCP in 2-3 weeks for BP check.

## 2016-03-23 NOTE — Patient Instructions (Addendum)
Push fluids. You are now ready to go back to work.  Get back on track with healthy eating and weight loss.  Follow BP.. . Check BP at home... If BP > 140/90... Call.  Make appt with PCP in 2-3 weeks for HTN re-eval and likely need for medication increase.

## 2016-03-23 NOTE — Progress Notes (Signed)
Pre visit review using our clinic review tool, if applicable. No additional management support is needed unless otherwise documented below in the visit note. 

## 2016-08-27 ENCOUNTER — Encounter: Payer: Self-pay | Admitting: Family Medicine

## 2016-08-27 ENCOUNTER — Ambulatory Visit (INDEPENDENT_AMBULATORY_CARE_PROVIDER_SITE_OTHER): Payer: BC Managed Care – PPO | Admitting: Family Medicine

## 2016-08-27 VITALS — BP 124/84 | HR 82 | Temp 98.3°F | Ht 73.25 in | Wt >= 6400 oz

## 2016-08-27 DIAGNOSIS — J208 Acute bronchitis due to other specified organisms: Secondary | ICD-10-CM

## 2016-08-27 DIAGNOSIS — R062 Wheezing: Secondary | ICD-10-CM | POA: Diagnosis not present

## 2016-08-27 MED ORDER — AZITHROMYCIN 250 MG PO TABS: ORAL_TABLET | ORAL | 0 refills | Status: DC

## 2016-08-27 MED ORDER — ALBUTEROL SULFATE HFA 108 (90 BASE) MCG/ACT IN AERS: 2.0000 | INHALATION_SPRAY | Freq: Four times a day (QID) | RESPIRATORY_TRACT | 3 refills | Status: DC | PRN

## 2016-08-27 MED ORDER — PREDNISONE 20 MG PO TABS: ORAL_TABLET | ORAL | 0 refills | Status: DC

## 2016-08-27 NOTE — Progress Notes (Signed)
Dr. Frederico Hamman T. Dailen Mcclish, MD, [facility] [place], [address] Phone: [phone] Fax: [phone]  08/27/2016  Patient: [name] [name], MRN: [id number], DOB: 1970/03/29, 46 y.o.  Primary Physician:  Lucille Passy, MD   Chief Complaint  Patient presents with  . Cough    productive/white with some red, symptoms started Friday   Subjective:   [name] [name] is a 46 y.o. very pleasant male patient who presents with the following:  Pleasant gentleman with Body mass index is 53.2 kg/m. who previously was a 3 or 4 pack-a-day smoker who presents with worsening respiratory symptoms and now pulmonary and chest symptoms ongoing since Friday.  He has a productive cough of sputum of various colorations.  He is having some difficulty breathing.  He has been using an albuterol nebulizer with some success at home.  He also has an albuterol inhaler that he is been using.  Per report he does have a history of intermittent bouts of bronchitis.  And I see that he has required antibiotics and prednisone previously.  Former smoker.  Works as a Education officer, community.   Past Medical History, Surgical History, Social History, Family History, Problem List, Medications, and Allergies have been reviewed and updated if relevant.  Patient Active Problem List   Diagnosis Date Noted  . Viral gastroenteritis 03/23/2016  . History of colonic polyps 12/28/2015  . Abnormal CT scan, small bowel 12/28/2015  . Enteritis 12/21/2015  . Encounter for completion of form with patient 10/12/2015  . Small bowel obstruction ([place]) 05/31/2015  . Hepatic steatosis 05/31/2015  . Calculus of gallbladder with acute cholecystitis without obstruction 02/15/2015  . Cholelithiasis 02/13/2015  . Gallstone 02/10/2015  . Essential hypertension 07/31/2014  . GASTROENTERITIS WITHOUT DEHYDRATION 05/15/2010  . ANXIETY 11/14/2009  . DEPENDENT EDEMA 11/09/2008  . ANGER 10/31/2006    . 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  . ADHD (attention deficit hyperactivity disorder) 10/21/2006  . GERD 10/21/2006  . Morbid obesity ([place]) 07/30/2006  . HYPERTENSION, BENIGN ESSENTIAL 07/30/2006  . OSA (obstructive sleep apnea) 07/30/2006  . Hemorrhoids 10/03/2005    Past Medical History:  Diagnosis Date  . Asthma   . Bronchitis   . Chronic bronchitis ([place])   . Complication of anesthesia    usually wake up in the middle of procedure or surgery  . Gallstones   . GERD (gastroesophageal reflux disease)   . Hypertension   . Obesity   . Sleep apnea    "suppose to wear mask but I don't" (05/31/2015)    Past Surgical History:  Procedure Laterality Date  . CHOLECYSTECTOMY N/A 02/14/2015   Procedure: LAPAROSCOPIC CHOLECYSTECTOMY WITH INTRAOPERATIVE CHOLANGIOGRAM;  Surgeon: Coralie Keens, MD;  Location: [facility];  Service: General;  Laterality: N/A;  . COLONOSCOPY  10/16/2005   Polyps (tubular adenoma 2007), external hemorrhoids  . COLONOSCOPY WITH PROPOFOL N/A 01/05/2016   Procedure: COLONOSCOPY WITH PROPOFOL;  Surgeon: Milus Banister, MD;  Location: [facility];  Service: Endoscopy;  Laterality: N/A;  . FINGER SURGERY Left    reconstructive - 2 surgeries on hand  . LAPAROSCOPIC GASTRIC SLEEVE RESECTION  10/2010    at [facility] History  . Marital status: Married    Spouse name: N/A  . Number of children: 1  . Years of education: N/A   Occupational History  . corrections officer [employer]   Social History Main Topics  . Smoking status: Passive Smoke  Exposure - Never Smoker    Packs/day: 3.00    Years: 0.00    Types: Cigarettes    Last attempt to quit: 01/13/2008  . Smokeless tobacco: Current User    Types: Snuff  . Alcohol use 7.8 oz/week    13 Shots of liquor per week     Comment: 10/11//2017 "1 pint of liquor/wk"  . Drug use: No  . Sexual activity: Yes   Other Topics Concern  . Not on file   Social History Narrative   . No narrative on file    Family History  Problem Relation Age of Onset  . Diabetes Mellitus II Mother   . Breast cancer Mother   . Ovarian cancer Mother   . Diabetes Mellitus II Father   . CAD Father   . Stroke Father   . Cancer Father        ?  . Clotting disorder Father        blood clots in brain with surgery  . Cirrhosis Maternal Grandmother   . Breast cancer Maternal Grandmother     Allergies  Allergen Reactions  . Bee Venom Anaphylaxis  . Codeine Anaphylaxis    Tolerates dilaudid  . Hornet Venom Anaphylaxis  . Penicillins Hives and Other (See Comments)    Has patient had a PCN reaction causing immediate rash, facial/tongue/throat swelling, SOB or lightheadedness with hypotensionYES Has patient had a PCN reaction causing severe rash involving mucus membranes or skin necrosis: NO Has patient had a PCN reaction that required hospitalization NO Has patient had a PCN reaction occurring within the last 10 years: NO If all of the above answers are "NO", then may proceed with Cephalosporin use.    Medication list reviewed and updated in full in [facility].  ROS: GEN: Acute illness details above GI: Tolerating PO intake GU: maintaining adequate hydration and urination Pulm: No SOB Interactive and getting along well at home.  Otherwise, ROS is as per the HPI.  Objective:   BP 124/84   Pulse 82   Temp 98.3 F (36.8 C) (Oral)   Ht 6' 1.25" (1.861 m)   Wt (!) 406 lb (184.2 kg)   SpO2 97%   BMI 53.20 kg/m    GEN: A and O x 3. WDWN. NAD.    ENT: Nose clear, ext NML.  No LAD.  No JVD.  TM's clear. Oropharynx clear.  PULM: Normal WOB, no distress. No crackles, wheezes, but scattered rhonchi. CV: RRR, no M/G/R, No rubs, No JVD.   EXT: warm and well-perfused, No c/c/e. PSYCH: Pleasant and conversant.   Laboratory and Imaging Data:  Assessment and Plan:   Acute bronchitis due to other specified organisms  Wheezing - Plan: albuterol (PROVENTIL  HFA;VENTOLIN HFA) 108 (90 Base) MCG/ACT inhaler   Given high-volume smoking history, underlying pulmonary disease cannot be excluded.  Continue with albuterol as needed both by nebulizer and HFA.  Short course of oral steroids, and antibiotics.  Follow-up: No Follow-up on file.  Meds ordered this encounter  Medications  . Phenylephrine-DM-GG-APAP (TYLENOL COLD/FLU SEVERE PO)    Sig: Take by mouth as needed.  Marland Kitchen Dextromethorphan-Guaifenesin (MUCINEX DM PO)    Sig: Take by mouth as needed.  Marland Kitchen albuterol (PROVENTIL HFA;VENTOLIN HFA) 108 (90 Base) MCG/ACT inhaler    Sig: Inhale 2 puffs into the lungs every 6 (six) hours as needed for wheezing or shortness of breath.    Dispense:  1 Inhaler    Refill:  3  .  azithromycin (ZITHROMAX) 250 MG tablet    Sig: 2 tabs po on day 1, then 1 tab po for 4 days    Dispense:  6 tablet    Refill:  0  . predniSONE (DELTASONE) 20 MG tablet    Sig: 2 tabs for 4 days, then 1 tab for 4 days    Dispense:  12 tablet    Refill:  0   Medications Discontinued During This Encounter  Medication Reason  . albuterol (PROVENTIL HFA;VENTOLIN HFA) 108 (90 Base) MCG/ACT inhaler Reorder   No orders of the defined types were placed in this encounter.   Signed,  Maud Deed. Taja Pentland, MD   Allergies as of 08/27/2016      Reactions   Bee Venom Anaphylaxis   Codeine Anaphylaxis   Tolerates dilaudid   Hornet Venom Anaphylaxis   Penicillins Hives, Other (See Comments)   Has patient had a PCN reaction causing immediate rash, facial/tongue/throat swelling, SOB or lightheadedness with hypotensionYES Has patient had a PCN reaction causing severe rash involving mucus membranes or skin necrosis: NO Has patient had a PCN reaction that required hospitalization NO Has patient had a PCN reaction occurring within the last 10 years: NO If all of the above answers are "NO", then may proceed with Cephalosporin use.      Medication List       Accurate as of 08/27/16 11:59 PM.  Always use your most recent med list.          albuterol 0.63 MG/3ML nebulizer solution Commonly known as:  ACCUNEB Take 1 ampule by nebulization every 6 (six) hours as needed for wheezing or shortness of breath.   albuterol 108 (90 Base) MCG/ACT inhaler Commonly known as:  PROVENTIL HFA;VENTOLIN HFA Inhale 2 puffs into the lungs every 6 (six) hours as needed for wheezing or shortness of breath.   amLODipine 5 MG tablet Commonly known as:  NORVASC Take 1 tablet (5 mg total) by mouth daily.   azithromycin 250 MG tablet Commonly known as:  ZITHROMAX 2 tabs po on day 1, then 1 tab po for 4 days   MUCINEX DM PO Take by mouth as needed.   omeprazole 20 MG capsule Commonly known as:  PRILOSEC TAKE ONE CAPSULE BY MOUTH ONCE DAILY   ondansetron 4 MG tablet Commonly known as:  ZOFRAN Take 1 tablet (4 mg total) by mouth every 8 (eight) hours as needed for nausea or vomiting.   oxyCODONE-acetaminophen 5-325 MG tablet Commonly known as:  PERCOCET/ROXICET Take 1 tablet by mouth as needed for severe pain.   predniSONE 20 MG tablet Commonly known as:  DELTASONE 2 tabs for 4 days, then 1 tab for 4 days   TYLENOL COLD/FLU SEVERE PO Take by mouth as needed.

## 2016-08-28 ENCOUNTER — Telehealth: Payer: Self-pay | Admitting: *Deleted

## 2016-08-28 MED ORDER — ALBUTEROL SULFATE HFA 108 (90 BASE) MCG/ACT IN AERS: 2.0000 | INHALATION_SPRAY | Freq: Four times a day (QID) | RESPIRATORY_TRACT | 3 refills | Status: DC | PRN

## 2016-08-28 NOTE — Telephone Encounter (Signed)
Received a faxed form from pharmacy for prior authorization or change medication to another med. Insurance will not cover Ventolin.  Note from pharmacy recommends  Proair HFA, Proair Respiclick

## 2016-08-28 NOTE — Telephone Encounter (Signed)
proair is fine. Same med. I would do proair hfa

## 2016-08-28 NOTE — Telephone Encounter (Signed)
New Rx sent to pharmacy

## 2016-10-03 IMAGING — NM NM HEPATOBILIARY IMAGE, INC GB
1 series · 6 of 6 positions shown · non-contrast
Comparison: Ultrasound 9997 hours today, and earlier.

CLINICAL DATA: 44-year-old male with cholelithiasis on ultrasound
and right upper quadrant pain. Initial encounter.

EXAM:
NUCLEAR MEDICINE HEPATOBILIARY IMAGING
TECHNIQUE: Sequential images of the abdomen were obtained [DATE] minutes
following intravenous administration of radiopharmaceutical.
RADIOPHARMACEUTICALS:  5.2 MCi 5c-88m  Choletec IV

[he hepatobiliary · 3.43mm/px · 6 of 45 frames shown]
[frame 4/45]
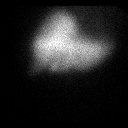
[frame 12/45]
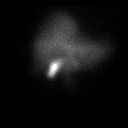
[frame 19/45]
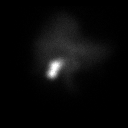
[frame 27/45]
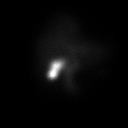
[frame 34/45]
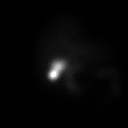
[frame 42/45]
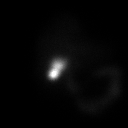

[6 of 6 positions shown; findings below may reference images not displayed]

FINDINGS: Prompt radiotracer uptake by the liver and clearance of the blood
pool. Prompt appearance of the gallbladder by 10 minutes, and
gallbladder activity increases over the exam. CBD activity by 15
minutes, and small bowel activity by 30-35 minutes. Radiotracer
activity washes out of the liver over the course of the study.
IMPRESSION: Negative study; patent cystic duct and CBD.

Gallbladder activity by 10 minutes and small bowel activity by 30
minutes.

## 2016-10-04 IMAGING — RF DG CHOLANGIOGRAM OPERATIVE
1 series · 6 of 6 positions shown · non-contrast
Comparison: Nuclear medicine study 02/13/2015, ultrasound
02/13/2015

CLINICAL DATA: 44-year-old male with a history of cholelithiasis

EXAM:
INTRAOPERATIVE CHOLANGIOGRAM
TECHNIQUE: Cholangiographic images from the C-arm fluoroscopic device were
submitted for interpretation post-operatively. Please see the
procedural report for the amount of contrast and the fluoroscopy
time utilized.

[Series 1: run · 3 acquisitions, 6 frames shown]
[im 1/3]
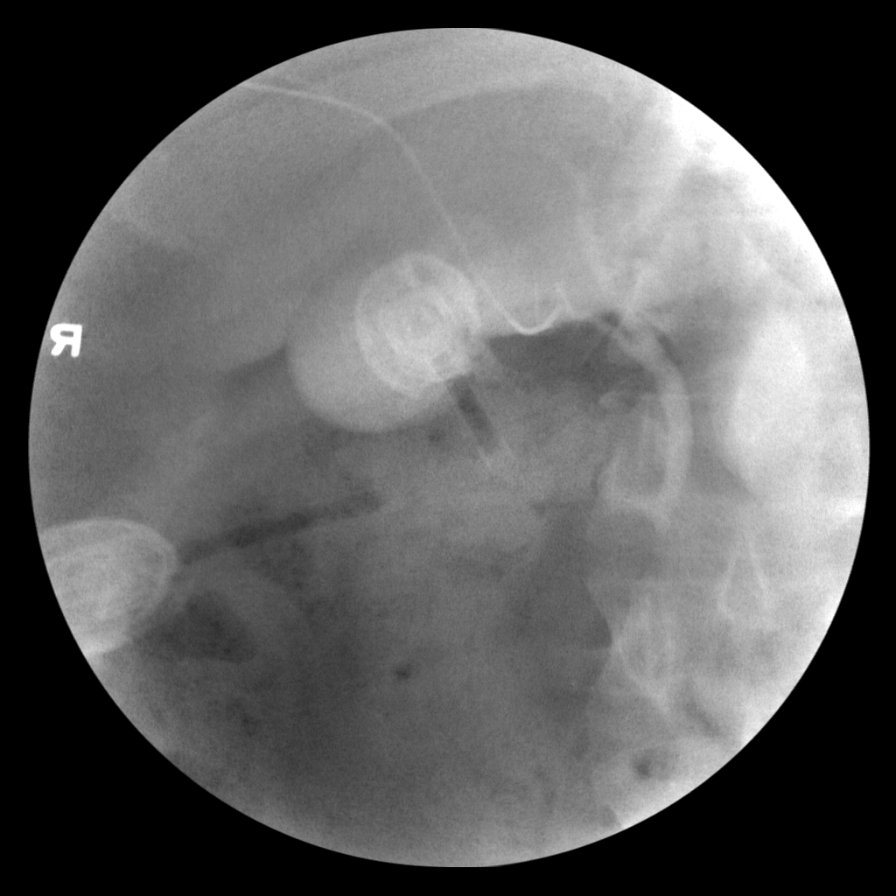
[im 1/3]
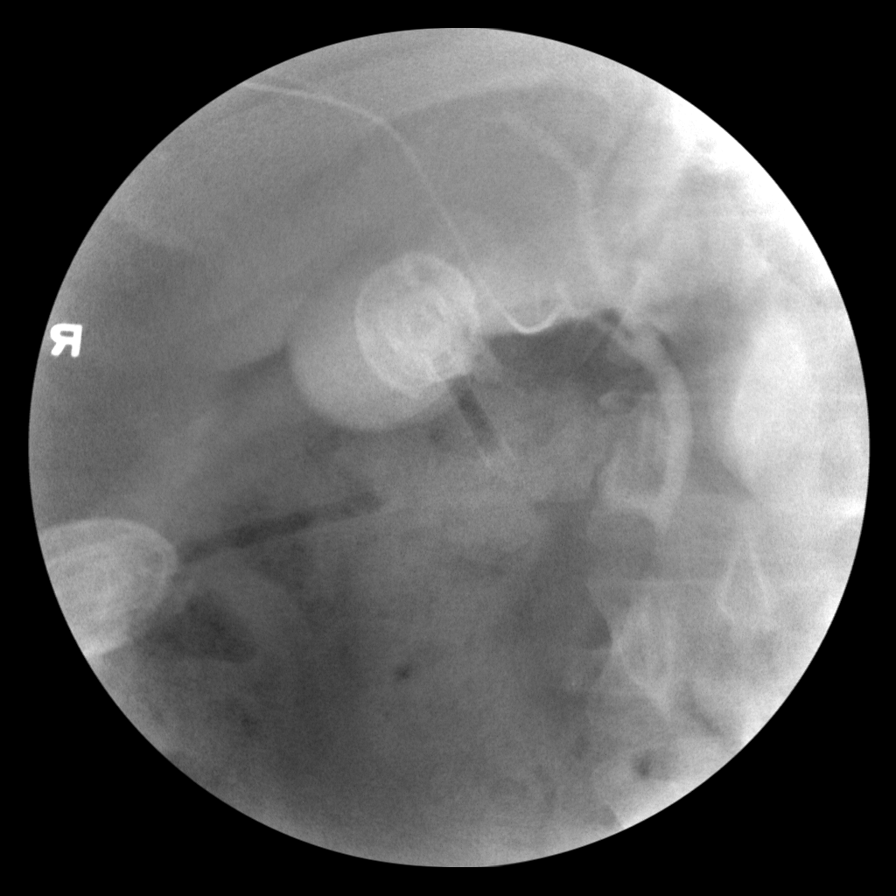
[im 1/3]
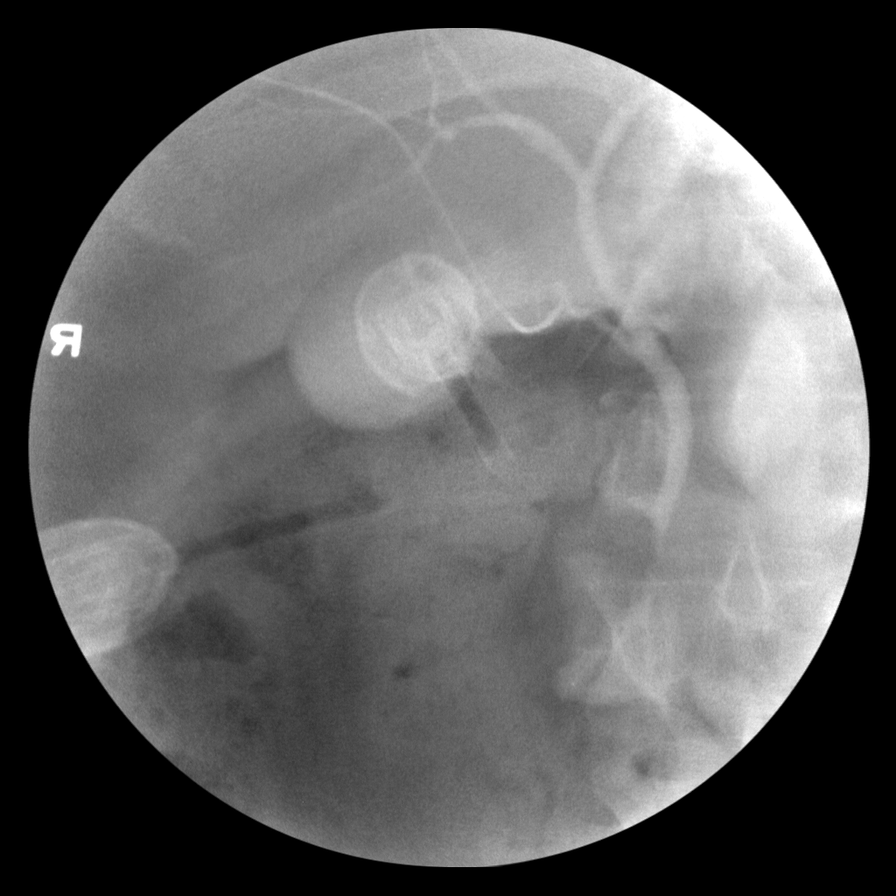
[im 1/3]
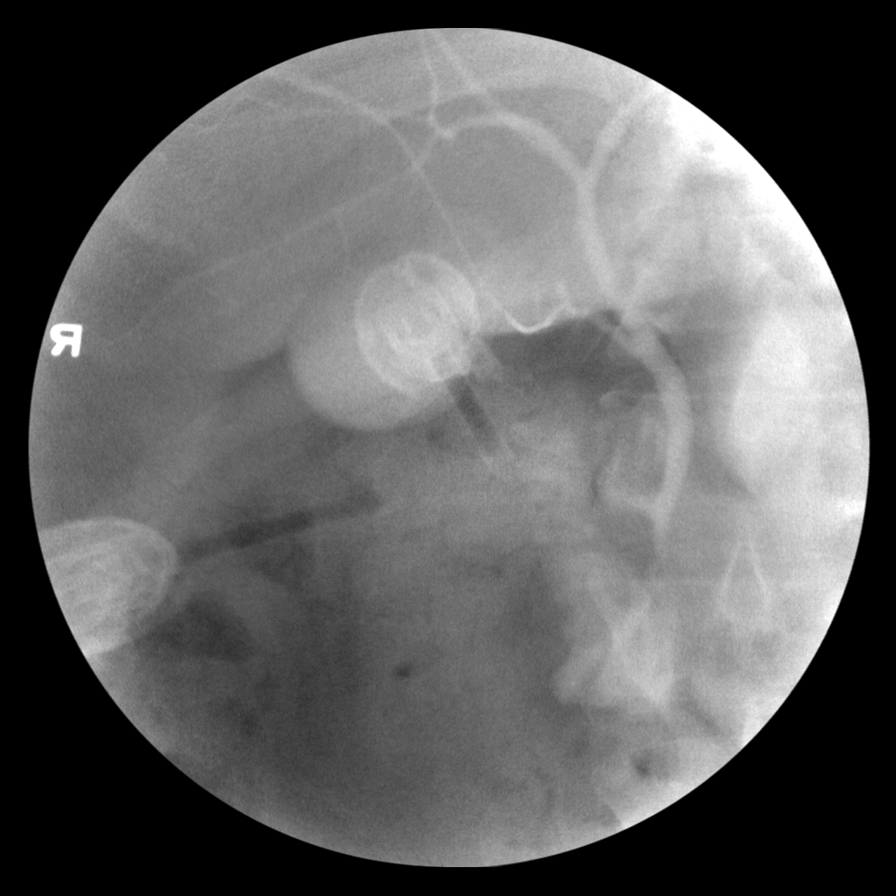
[im 2/3]
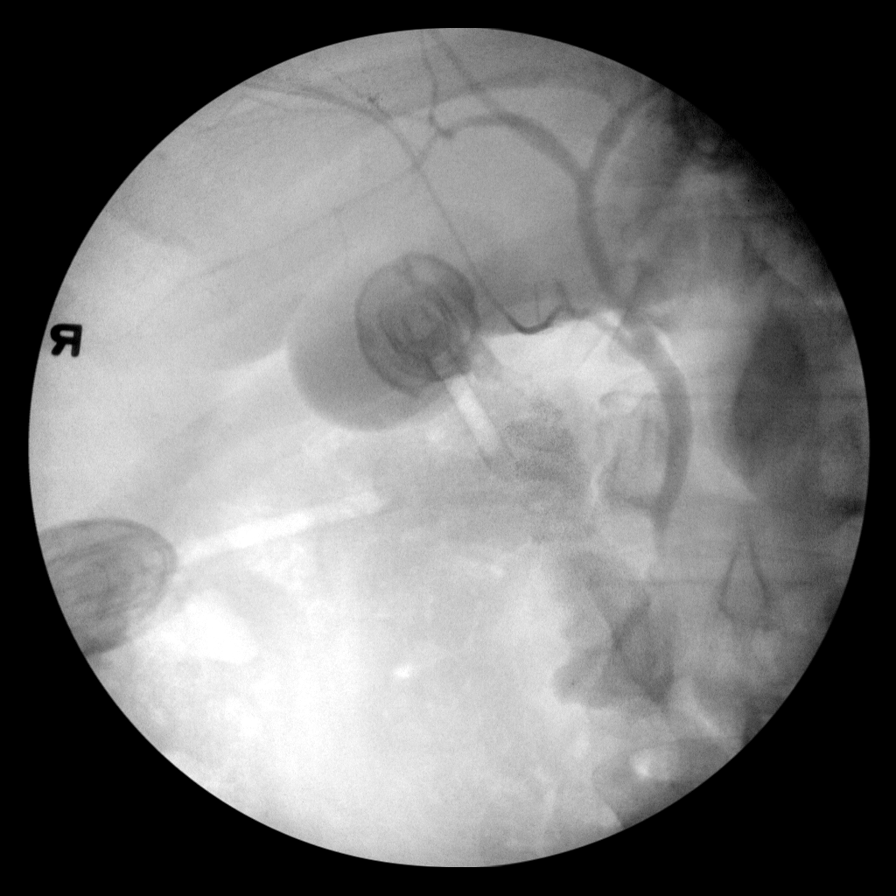
[im 3/3]
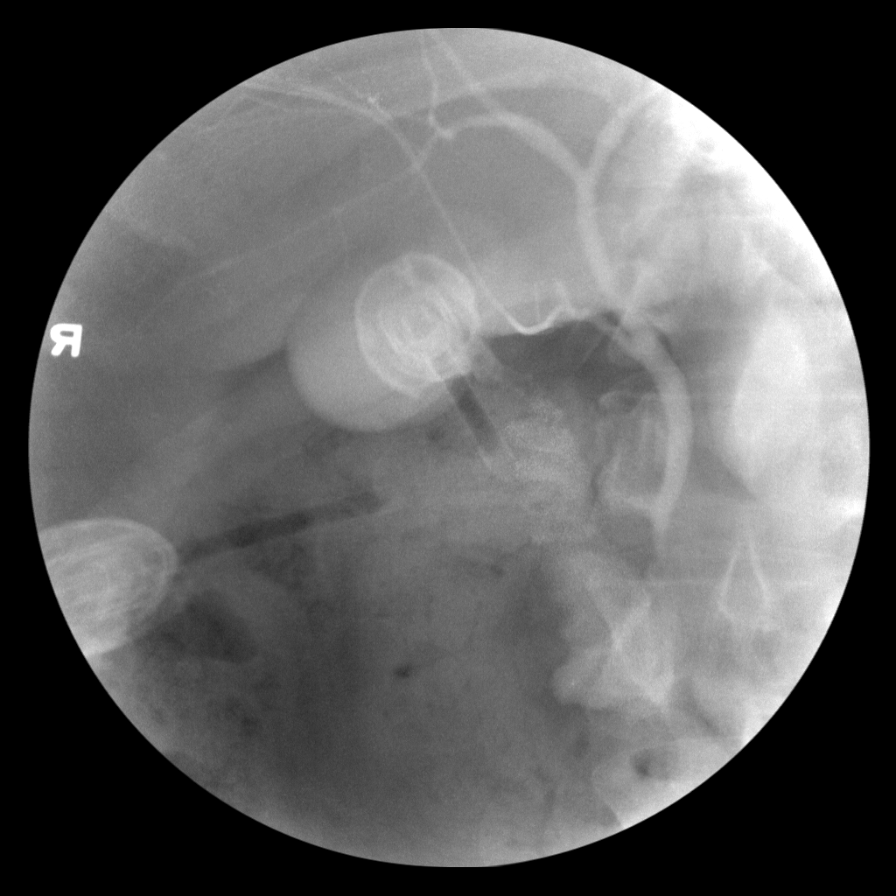

[6 of 6 positions shown; findings below may reference images not displayed]

FINDINGS: Surgical instruments project over the upper abdomen.

There is cannulation of the cystic duct/gallbladder neck, with
antegrade infusion of contrast. Caliber of the extrahepatic ductal
system within normal limits.

No large filling defect identified.

Free flow of contrast across the ampulla.
IMPRESSION: Intraoperative cholangiogram demonstrates extrahepatic biliary ducts
of unremarkable caliber, with no large filling defect identified.
Free flow of contrast across the ampulla.

Please refer to the dictated operative report for full details of
intraoperative findings and procedure

## 2016-10-05 ENCOUNTER — Other Ambulatory Visit: Payer: Self-pay | Admitting: Family Medicine

## 2016-10-06 IMAGING — DX DG CHEST 2V
2 series · 2 of 2 positions shown · non-contrast
Comparison: Chest x-ray 02/03/2015.

CLINICAL DATA: Chest pain and shortness of breath today. Recently
released from the hospital yesterday. History of cholecystectomy on
[REDACTED].

EXAM:
CHEST  2 VIEW

[chest pa]
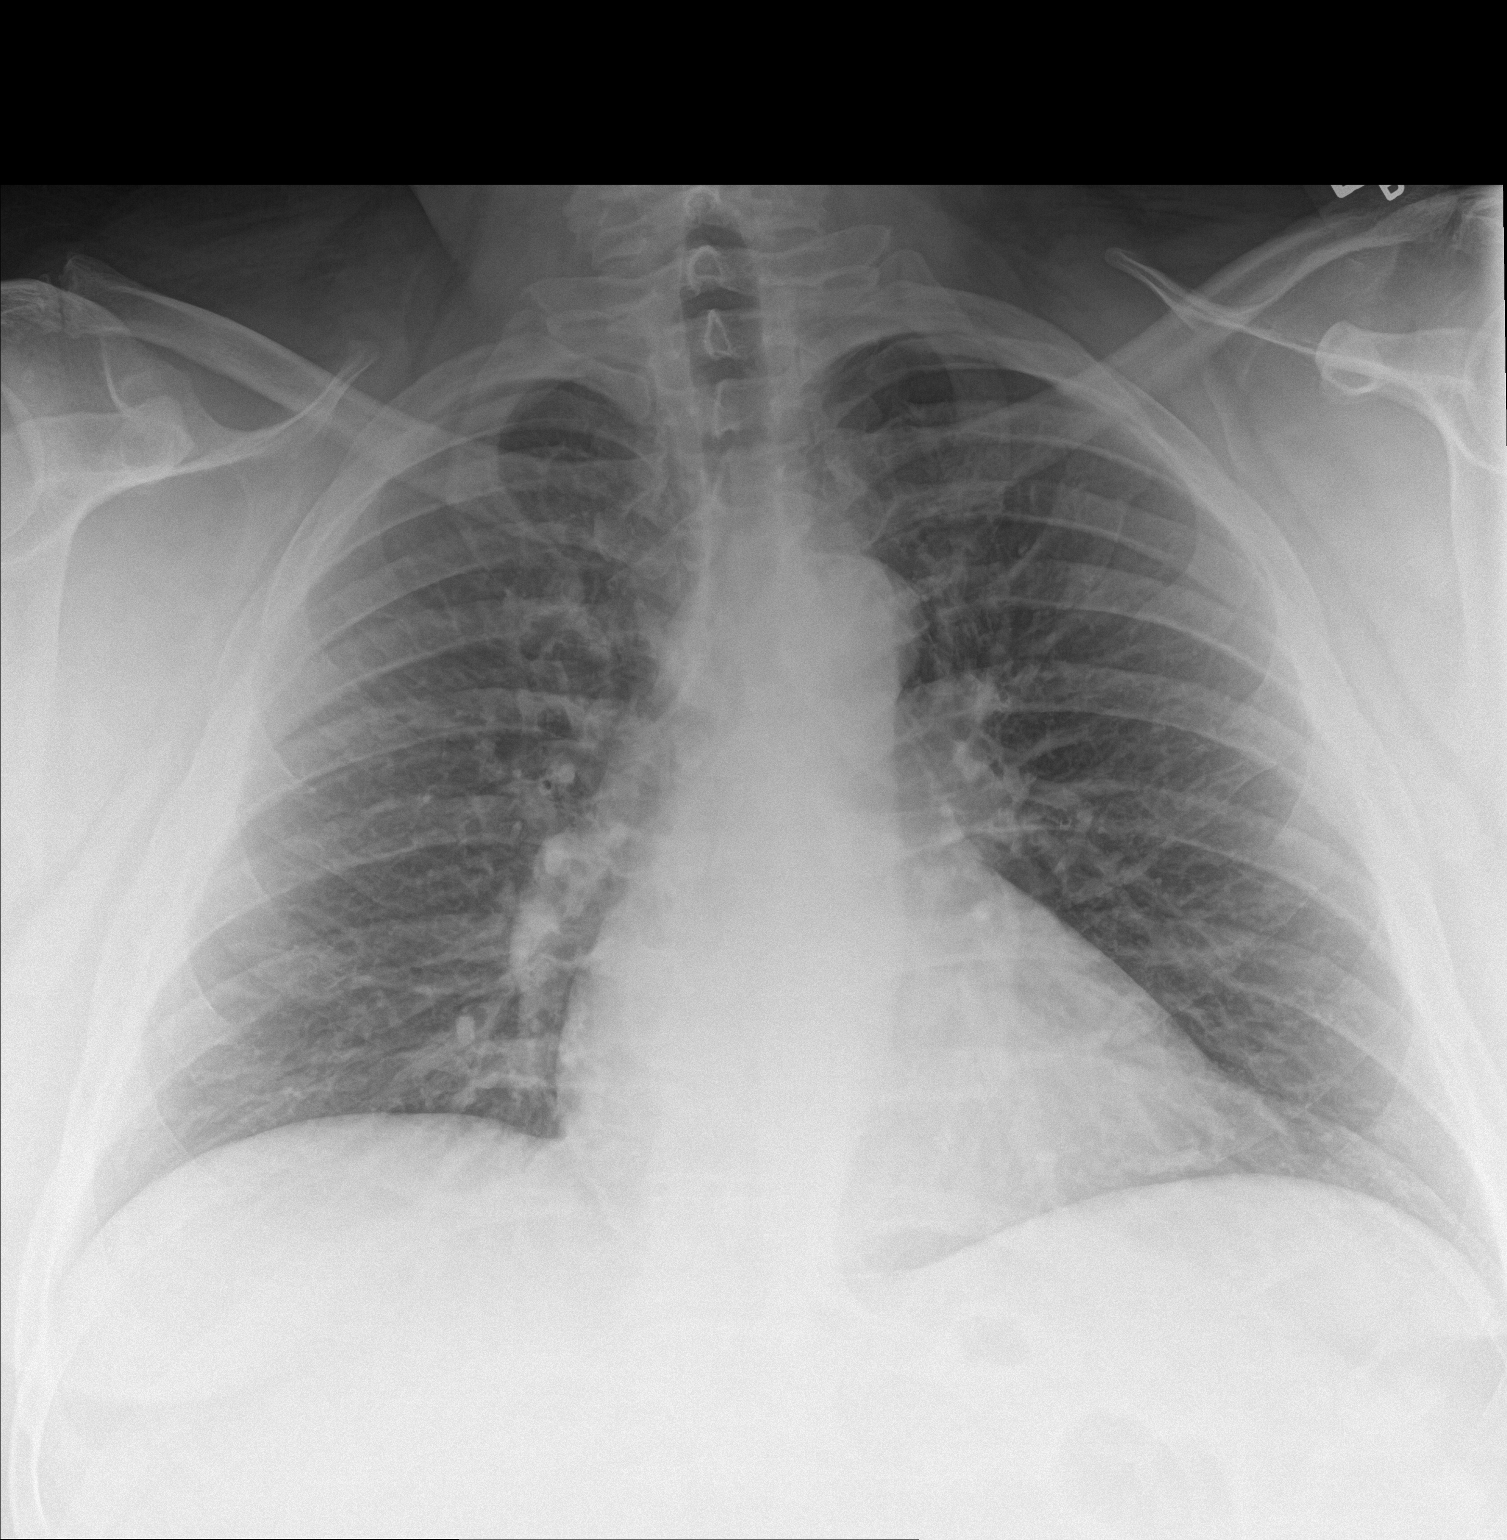

[chest lat]
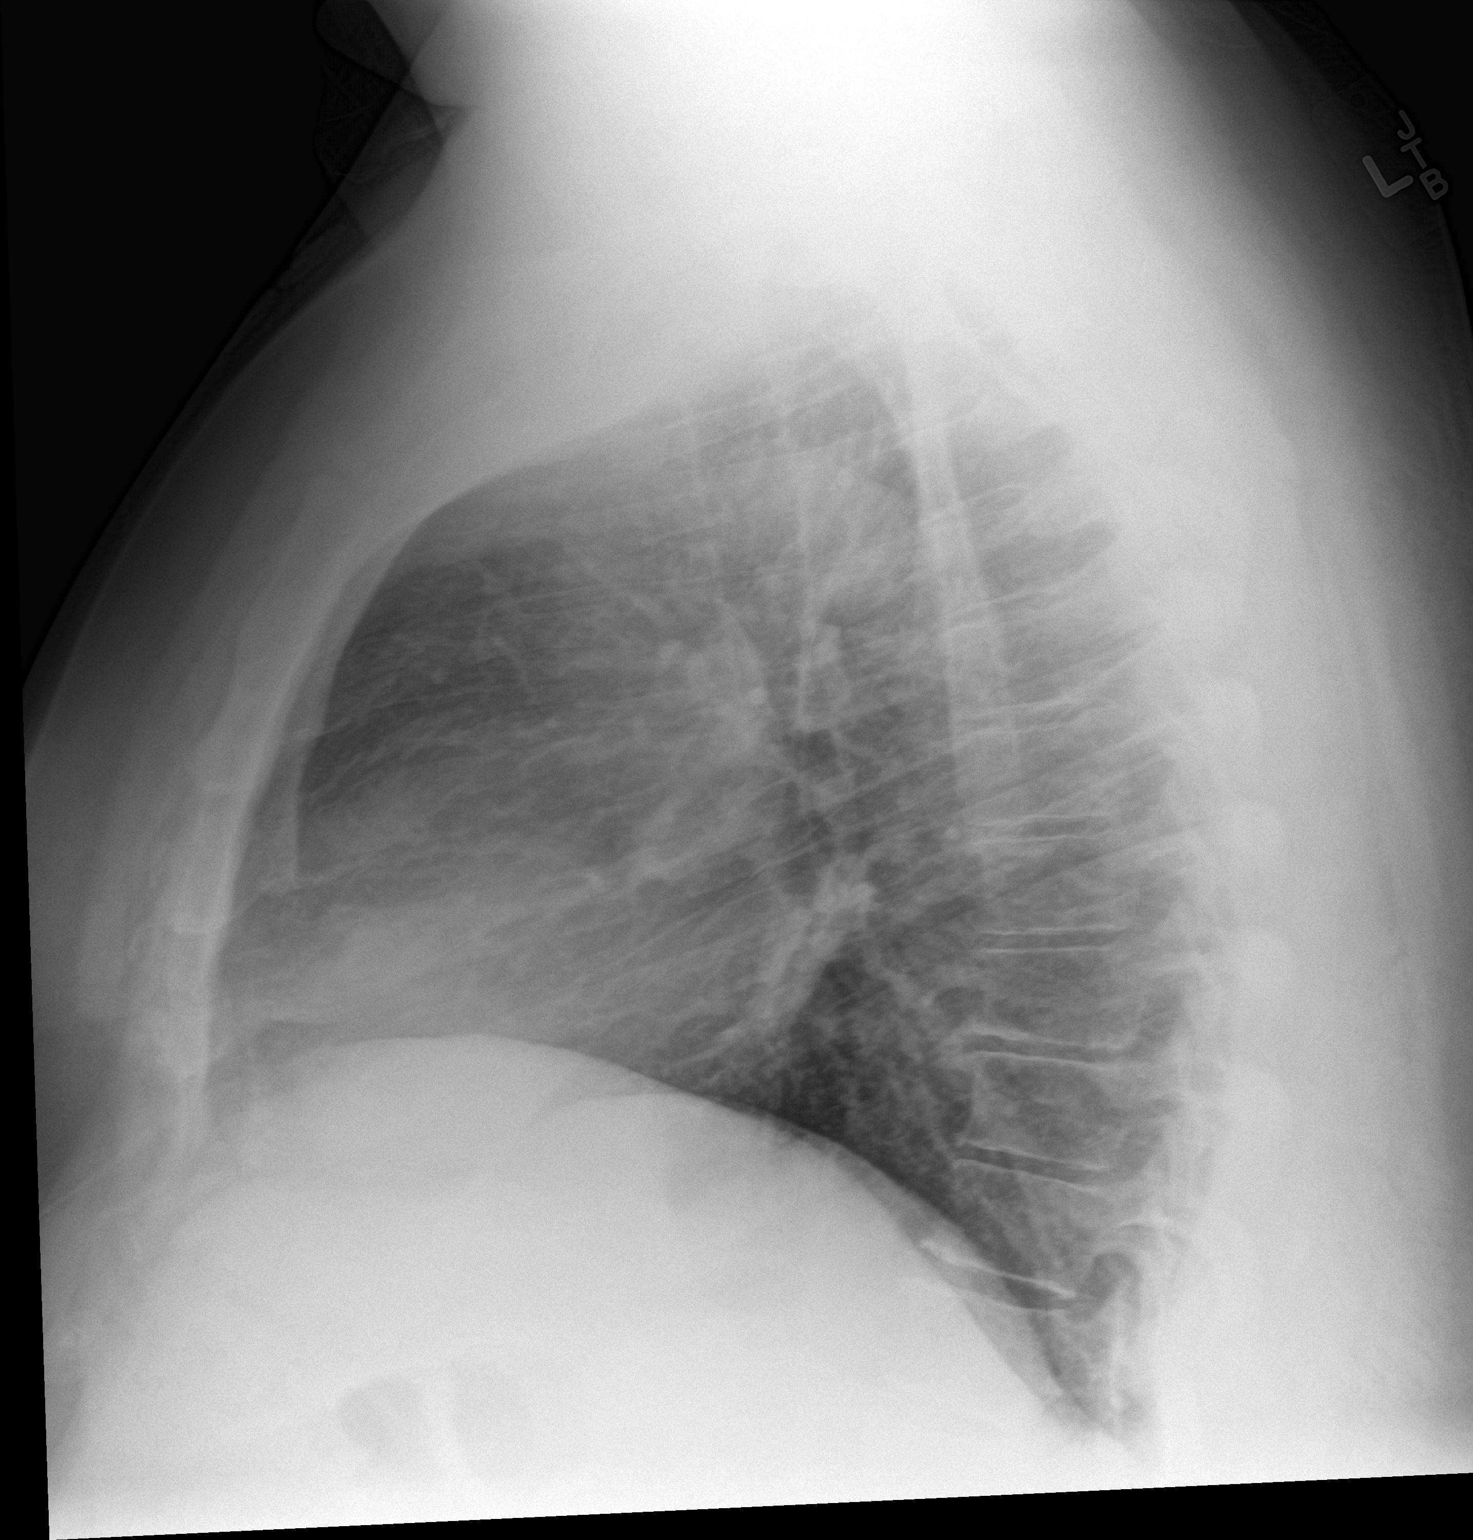

[2 of 2 positions shown; findings below may reference images not displayed]

FINDINGS: The cardiac silhouette, mediastinal and hilar contours are within
normal limits and stable. There is mild tortuosity of the thoracic
aorta. The lungs are clear. No pleural effusion. The bony thorax is
intact.
IMPRESSION: No acute cardiopulmonary findings.

## 2016-11-06 ENCOUNTER — Ambulatory Visit (INDEPENDENT_AMBULATORY_CARE_PROVIDER_SITE_OTHER): Payer: BC Managed Care – PPO | Admitting: Family Medicine

## 2016-11-06 ENCOUNTER — Encounter: Payer: Self-pay | Admitting: Family Medicine

## 2016-11-06 VITALS — BP 126/88 | HR 91 | Temp 98.1°F | Resp 16 | Wt >= 6400 oz

## 2016-11-06 DIAGNOSIS — R197 Diarrhea, unspecified: Secondary | ICD-10-CM | POA: Insufficient documentation

## 2016-11-06 HISTORY — DX: Diarrhea, unspecified: R19.7

## 2016-11-06 LAB — POCT URINALYSIS DIPSTICK
BILIRUBIN UA: NEGATIVE
GLUCOSE UA: NEGATIVE
KETONES UA: NEGATIVE
Leukocytes, UA: NEGATIVE
NITRITE UA: NEGATIVE
Protein, UA: NEGATIVE
RBC UA: NEGATIVE
Spec Grav, UA: >=1.03 — AB (ref 1.010–1.025)
Urobilinogen, UA: 0.2 E.U./dL
pH, UA: 6 (ref 5.0–8.0)

## 2016-11-06 NOTE — Progress Notes (Signed)
Subjective:   Patient ID: [name] [name], male    DOB: 1970-06-23, 46 y.o.   MRN: [id number]  Amair Shrout is a pleasant 46 y.o. year old male who presents to clinic today with Diarrhea  on 11/06/2016  HPI:  Diarrhea - intermittent for months.  Colonoscopy 01/05/16- polyps, recommended recall in 5 years.  Per pt, he did not discuss his intermittent diarrhea with GI.  No blood or mucous in his stool. Has not tried imodium or anything else for it.  It does seem to be worsened by stress. Going through a divorce, his mom has end stage ovarian CA.  No nausea or vomiting.  Current Outpatient Prescriptions on File Prior to Visit  Medication Sig Dispense Refill  . albuterol (ACCUNEB) 0.63 MG/3ML nebulizer solution Take 1 ampule by nebulization every 6 (six) hours as needed for wheezing or shortness of breath.    Marland Kitchen albuterol (PROAIR HFA) 108 (90 Base) MCG/ACT inhaler Inhale 2 puffs into the lungs every 6 (six) hours as needed for wheezing or shortness of breath. 1 Inhaler 3  . amLODipine (NORVASC) 5 MG tablet TAKE ONE TABLET BY MOUTH ONCE DAILY 90 tablet 2  . omeprazole (PRILOSEC) 20 MG capsule TAKE ONE CAPSULE BY MOUTH ONCE DAILY 30 capsule 5  . oxyCODONE-acetaminophen (PERCOCET/ROXICET) 5-325 MG tablet Take 1 tablet by mouth as needed for severe pain.     No current facility-administered medications on file prior to visit.     Allergies  Allergen Reactions  . Bee Venom Anaphylaxis  . Codeine Anaphylaxis    Tolerates dilaudid  . Hornet Venom Anaphylaxis  . Penicillins Hives and Other (See Comments)    Has patient had a PCN reaction causing immediate rash, facial/tongue/throat swelling, SOB or lightheadedness with hypotensionYES Has patient had a PCN reaction causing severe rash involving mucus membranes or skin necrosis: NO Has patient had a PCN reaction that required hospitalization NO Has patient had a PCN reaction occurring within the last 10 years: NO If all of the above  answers are "NO", then may proceed with Cephalosporin use.    Past Medical History:  Diagnosis Date  . Asthma   . Bronchitis   . Chronic bronchitis ([place])   . Complication of anesthesia    usually wake up in the middle of procedure or surgery  . Gallstones   . GERD (gastroesophageal reflux disease)   . Hypertension   . Obesity   . Sleep apnea    "suppose to wear mask but I don't" (05/31/2015)    Past Surgical History:  Procedure Laterality Date  . CHOLECYSTECTOMY N/A 02/14/2015   Procedure: LAPAROSCOPIC CHOLECYSTECTOMY WITH INTRAOPERATIVE CHOLANGIOGRAM;  Surgeon: Coralie Keens, MD;  Location: [facility];  Service: General;  Laterality: N/A;  . COLONOSCOPY  10/16/2005   Polyps (tubular adenoma 2007), external hemorrhoids  . COLONOSCOPY WITH PROPOFOL N/A 01/05/2016   Procedure: COLONOSCOPY WITH PROPOFOL;  Surgeon: Milus Banister, MD;  Location: [facility];  Service: Endoscopy;  Laterality: N/A;  . FINGER SURGERY Left    reconstructive - 2 surgeries on hand  . LAPAROSCOPIC GASTRIC SLEEVE RESECTION  10/2010    at [facility] History  Problem Relation Age of Onset  . Diabetes Mellitus II Mother   . Breast cancer Mother   . Ovarian cancer Mother   . Diabetes Mellitus II Father   . CAD Father   . Stroke Father   . Cancer Father        ?  . Clotting  disorder Father        blood clots in brain with surgery  . Cirrhosis Maternal Grandmother   . Breast cancer Maternal Grandmother     Social History   Social History  . Marital status: Married    Spouse name: N/A  . Number of children: 1  . Years of education: N/A   Occupational History  . corrections officer [employer]   Social History Main Topics  . Smoking status: Passive Smoke Exposure - Never Smoker    Packs/day: 3.00    Years: 0.00    Types: Cigarettes    Last attempt to quit: 01/13/2008  . Smokeless tobacco: Current User    Types: Snuff  . Alcohol use 7.8 oz/week    13 Shots of liquor per week      Comment: 10/11//2017 "1 pint of liquor/wk"  . Drug use: No  . Sexual activity: Yes   Other Topics Concern  . Not on file   Social History Narrative  . No narrative on file   The PMH, PSH, Social History, Family History, Medications, and allergies have been reviewed in [facility], and have been updated if relevant.   Review of Systems  Constitutional: Negative.   Gastrointestinal: Positive for diarrhea. Negative for abdominal distention, abdominal pain, anal bleeding, blood in stool, constipation, nausea, rectal pain and vomiting.  Genitourinary: Negative.   All other systems reviewed and are negative.      Objective:    BP 126/88   Pulse 91   Temp 98.1 F (36.7 C) (Oral)   Resp 16   Wt (!) 418 lb 3.2 oz (189.7 kg)   SpO2 98%   BMI 54.80 kg/m    Physical Exam  Constitutional: He is oriented to person, place, and time. He appears well-developed and well-nourished. No distress.  HENT:  Head: Normocephalic and atraumatic.  Eyes: Conjunctivae are normal.  Cardiovascular: Normal rate.   Pulmonary/Chest: Effort normal.  Abdominal: Bowel sounds are normal. He exhibits no distension and no mass. There is no tenderness. There is no rebound and no guarding.  Neurological: He is alert and oriented to person, place, and time. No cranial nerve deficit.  Skin: Skin is warm and dry. He is not diaphoretic.  Psychiatric: He has a normal mood and affect. His behavior is normal. Judgment and thought content normal.  Nursing note and vitals reviewed.         Assessment & Plan:   Diarrhea, unspecified type - Plan: POC Urinalysis Dipstick  Diarrhea, unspecified type - Plan: POC Urinalysis Dipstick No Follow-up on file.

## 2016-11-06 NOTE — Patient Instructions (Signed)
Great to see you.  Let's try a probiotic- Align or Culturelle over the counter daily.  Please keep me updated.

## 2016-11-06 NOTE — Assessment & Plan Note (Signed)
Chronic, intermittent. ?IBS. Advised adding daily probiotic- see AVS. Call or return to clinic prn if these symptoms worsen or fail to improve as anticipated.

## 2016-12-21 ENCOUNTER — Emergency Department (HOSPITAL_COMMUNITY): Payer: Worker's Compensation

## 2016-12-21 ENCOUNTER — Emergency Department (HOSPITAL_COMMUNITY)
Admission: EM | Admit: 2016-12-21 | Discharge: 2016-12-21 | Disposition: A | Discharge status: Home / Self Care | Payer: Worker's Compensation | Attending: Emergency Medicine | Admitting: Emergency Medicine

## 2016-12-21 ENCOUNTER — Encounter (HOSPITAL_COMMUNITY): Payer: Self-pay | Admitting: Emergency Medicine

## 2016-12-21 DIAGNOSIS — Y999 Unspecified external cause status: Secondary | ICD-10-CM | POA: Diagnosis not present

## 2016-12-21 DIAGNOSIS — S8391XA Sprain of unspecified site of right knee, initial encounter: Secondary | ICD-10-CM | POA: Insufficient documentation

## 2016-12-21 DIAGNOSIS — S8991XA Unspecified injury of right lower leg, initial encounter: Secondary | ICD-10-CM | POA: Diagnosis present

## 2016-12-21 DIAGNOSIS — Z79899 Other long term (current) drug therapy: Secondary | ICD-10-CM | POA: Diagnosis not present

## 2016-12-21 DIAGNOSIS — Y929 Unspecified place or not applicable: Secondary | ICD-10-CM | POA: Diagnosis not present

## 2016-12-21 DIAGNOSIS — I10 Essential (primary) hypertension: Secondary | ICD-10-CM | POA: Insufficient documentation

## 2016-12-21 DIAGNOSIS — F17290 Nicotine dependence, other tobacco product, uncomplicated: Secondary | ICD-10-CM | POA: Insufficient documentation

## 2016-12-21 DIAGNOSIS — X500XXA Overexertion from strenuous movement or load, initial encounter: Secondary | ICD-10-CM | POA: Diagnosis not present

## 2016-12-21 DIAGNOSIS — Y93A2 Activity, calisthenics: Secondary | ICD-10-CM | POA: Insufficient documentation

## 2016-12-21 DIAGNOSIS — J45909 Unspecified asthma, uncomplicated: Secondary | ICD-10-CM | POA: Insufficient documentation

## 2016-12-21 MED ORDER — OXYCODONE-ACETAMINOPHEN 5-325 MG PO TABS: 1.0000 | ORAL_TABLET | Freq: Four times a day (QID) | ORAL | 0 refills | Status: DC | PRN

## 2016-12-21 MED ORDER — ACETAMINOPHEN 325 MG PO TABS
650.0000 mg | ORAL_TABLET | Freq: Once | ORAL | Status: AC
  Administered 2016-12-21: 650 mg via ORAL
  Filled 2016-12-21: qty 2

## 2016-12-21 NOTE — ED Provider Notes (Signed)
[facility] EMERGENCY DEPARTMENT Provider Note   CSN: [id number] Arrival date & time: 12/21/16  1828     History   Chief Complaint Chief Complaint  Patient presents with  . Knee Pain    HPI [name] [name] is a 46 y.o. male.  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 He was doing a lot of bending, turning, twisting. He states that he injured his knee. He tried ice, but this was not successful. He now has pain with walking or standing. He has some swelling. The swelling however has improved from 2 days ago. He presents now for additional assistance with this issue.      Past Medical History:  Diagnosis Date  . Asthma   . Bronchitis   . Chronic bronchitis ([place])   . Complication of anesthesia    usually wake up in the middle of procedure or surgery  . Gallstones   . GERD (gastroesophageal reflux disease)   . Hypertension   . Obesity   . Sleep apnea    "suppose to wear mask but I don't" (05/31/2015)    Patient Active Problem List   Diagnosis Date Noted  . Diarrhea 11/06/2016  . History of colonic polyps 12/28/2015  . Abnormal CT scan, small bowel 12/28/2015  . Enteritis 12/21/2015  . Encounter for completion of form with patient 10/12/2015  . Small bowel obstruction ([place]) 05/31/2015  . Hepatic steatosis 05/31/2015  . Calculus of gallbladder with acute cholecystitis without obstruction 02/15/2015  . Cholelithiasis 02/13/2015  . Gallstone 02/10/2015  . Essential hypertension 07/31/2014  . GASTROENTERITIS WITHOUT DEHYDRATION 05/15/2010  . ANXIETY 11/14/2009  . DEPENDENT EDEMA 11/09/2008  . ANGER 10/31/2006  . 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  . ADHD (attention deficit hyperactivity disorder) 10/21/2006  . GERD 10/21/2006  . Morbid obesity ([place]) 07/30/2006  . HYPERTENSION, BENIGN ESSENTIAL 07/30/2006  . OSA (obstructive sleep apnea) 07/30/2006  . Hemorrhoids 10/03/2005    Past Surgical History:  Procedure  Laterality Date  . CHOLECYSTECTOMY N/A 02/14/2015   Procedure: LAPAROSCOPIC CHOLECYSTECTOMY WITH INTRAOPERATIVE CHOLANGIOGRAM;  Surgeon: Coralie Keens, MD;  Location: [facility];  Service: General;  Laterality: N/A;  . COLONOSCOPY  10/16/2005   Polyps (tubular adenoma 2007), external hemorrhoids  . COLONOSCOPY WITH PROPOFOL N/A 01/05/2016   Procedure: COLONOSCOPY WITH PROPOFOL;  Surgeon: Milus Banister, MD;  Location: [facility];  Service: Endoscopy;  Laterality: N/A;  . FINGER SURGERY Left    reconstructive - 2 surgeries on hand  . 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 Authorizing Provider  albuterol (ACCUNEB) 0.63 MG/3ML nebulizer solution Take 1 ampule by nebulization every 6 (six) hours as needed for wheezing or shortness of breath.    [provider]  albuterol (PROAIR HFA) 108 (90 Base) MCG/ACT inhaler Inhale 2 puffs into the lungs every 6 (six) hours as needed for wheezing or shortness of breath. 08/28/16   Copland, Frederico Hamman, MD  amLODipine (NORVASC) 5 MG tablet TAKE ONE TABLET BY MOUTH ONCE DAILY 10/05/16   Lucille Passy, MD  omeprazole (PRILOSEC) 20 MG capsule TAKE ONE CAPSULE BY MOUTH ONCE DAILY 10/05/16   Lucille Passy, MD  oxyCODONE-acetaminophen (PERCOCET/ROXICET) 5-325 MG tablet Take 1 tablet by mouth as needed for severe pain.    [provider]  Phenylephrine-DM-GG-APAP (TYLENOL COLD/FLU SEVERE PO)  Take by mouth as needed.    [provider]    Family History Family History  Problem Relation Age of Onset  . Diabetes Mellitus II Mother   . Breast cancer Mother   . Ovarian cancer Mother   . Diabetes Mellitus II Father   . CAD Father   . Stroke Father   . Cancer Father        ?  . Clotting disorder Father        blood clots in brain with surgery  . Cirrhosis Maternal Grandmother   . Breast cancer Maternal Grandmother      Social History Social History  Substance Use Topics  . Smoking status: Passive Smoke Exposure - Never Smoker    Packs/day: 3.00    Years: 0.00    Types: Cigarettes    Last attempt to quit: 01/13/2008  . Smokeless tobacco: Current User    Types: Snuff  . Alcohol use 7.8 oz/week    13 Shots of liquor per week     Comment: "a couple of shots a day"     Allergies   Bee venom; Codeine; Hornet venom; and Penicillins   Review of Systems Review of Systems  Constitutional: Negative for activity change.       All ROS Neg except as noted in HPI  HENT: Negative for nosebleeds.   Eyes: Negative for photophobia and discharge.  Respiratory: Negative for cough, shortness of breath and wheezing.   Cardiovascular: Negative for chest pain and palpitations.  Gastrointestinal: Negative for abdominal pain and blood in stool.  Genitourinary: Negative for dysuria, frequency and hematuria.  Musculoskeletal: Positive for arthralgias. Negative for back pain and neck pain.  Skin: Negative.   Neurological: Negative for dizziness, seizures and speech difficulty.  Psychiatric/Behavioral: Negative for confusion and hallucinations.     Physical Exam Updated Vital Signs BP (!) 151/96 (BP Location: Right Arm)   Pulse 72   Temp 98.6 F (37 C) (Oral)   Resp 19   Ht 6\' 2"  (1.88 m)   Wt (!) 186.9 kg (412 lb)   SpO2 98%   BMI 52.90 kg/m   Physical Exam  Constitutional: He is oriented to person, place, and time. He appears well-developed and well-nourished.  Non-toxic appearance.  HENT:  Head: Normocephalic.  Right Ear: Tympanic membrane and external ear normal.  Left Ear: Tympanic membrane and external ear normal.  Eyes: Pupils are equal, round, and reactive to light. EOM and lids are normal.  Neck: Normal range of motion. Neck supple. Carotid bruit is not present.  Cardiovascular: Normal rate, regular rhythm, normal heart sounds, intact distal pulses and normal pulses.   Pulmonary/Chest:  Breath sounds normal. No respiratory distress.  Abdominal: Soft. Bowel sounds are normal. There is no tenderness. There is no guarding.  Musculoskeletal: Normal range of motion.  No deformity of the quadricep area. There is some puffiness of the right knee. No actual effusion appreciated. The patella is in the midline. There is pain to palpation of the lateral right knee. There is soreness of the posterior portion of the knee. No mass appreciated. The anterior tibial tuberosity is within normal limits. Is full range of motion of the right ankle and toes.  Lymphadenopathy:       Head (right side): No submandibular adenopathy present.       Head (left side): No submandibular adenopathy present.    He has no cervical adenopathy.  Neurological: He is alert and oriented to person, place, and time. He  has normal strength. No cranial nerve deficit or sensory deficit.  Skin: Skin is warm and dry.  Psychiatric: He has a normal mood and affect. His speech is normal.  Nursing note and vitals reviewed.    ED Treatments / Results  Labs (all labs ordered are listed, but only abnormal results are displayed) Labs Reviewed - No data to display  EKG  EKG Interpretation None       Radiology Dg Knee Complete 4 Views Right  Result Date: 12/21/2016 CLINICAL DATA:  Patient states he was training at work on Wednesday and has had right knee pain since. EXAM: RIGHT KNEE - COMPLETE 4+ VIEW COMPARISON:  None. FINDINGS: No evidence of fracture, dislocation, or joint effusion. Tiny medial femorotibial compartment marginal osteophytes. Soft tissues are unremarkable. IMPRESSION: No acute osseous injury of the right knee. Electronically Signed   By: Kathreen Devoid   On: 12/21/2016 19:51    Procedures Procedures (including critical care time)  Medications Ordered in ED Medications  acetaminophen (TYLENOL) tablet 650 mg (650 mg Oral Given 12/21/16 2251)     Initial Impression / Assessment and Plan / ED Course   I have reviewed the triage vital signs and the nursing notes.  Pertinent labs & imaging results that were available during my care of the patient were reviewed by me and considered in my medical decision making (see chart for details).       Final Clinical Impressions(s) / ED Diagnoses MDM Blood pressure is elevated at 151/96, otherwise the vital signs within normal limits. The x-ray of the right knee shows degenerative changes present with osteophytes noted. There is no significant effusion present. No fracture or dislocation noted.  Patient examination suggest sprain of the knee. Patient provided a knee immobilizer and ice pack. He cannot take anti-inflammatory medication. I've asked him to use the Tylenol extra strength for mild pain. Prescription for Percocet given for more severe pain. Patient is referred to orthopedics. Questions were answered. Patient is in agreement with this plan.    Final diagnoses:  Sprain of right knee, unspecified ligament, initial encounter    New Prescriptions Discharge Medication List as of 12/21/2016 11:03 PM       Lily Kocher, PA-C 12/21/16 2310    Mesner, Corene Cornea, MD 12/24/16 1739

## 2016-12-21 NOTE — Discharge Instructions (Signed)
Your exam is consistent with a knee sprain. Please see Dr. Aline Brochure or the orthopedic specialist of your choice so that any additional diagnostic studies can be obtained. Please rest your knee is much as possible. Please elevate above your waist. Apply ice. Usually knee immobilizer until seen by orthopedics. You do not need to sleep in this device, but it should be on when you're up and about.

## 2016-12-21 NOTE — ED Triage Notes (Addendum)
Patient states he was training at work on Wednesday and has had right knee pain since.

## 2017-01-19 ENCOUNTER — Encounter (HOSPITAL_COMMUNITY): Payer: Self-pay | Admitting: Emergency Medicine

## 2017-01-19 ENCOUNTER — Emergency Department (HOSPITAL_COMMUNITY)
Admission: EM | Admit: 2017-01-19 | Discharge: 2017-01-19 | Disposition: A | Discharge status: Home / Self Care | Payer: Worker's Compensation | Attending: Emergency Medicine | Admitting: Emergency Medicine

## 2017-01-19 DIAGNOSIS — J45909 Unspecified asthma, uncomplicated: Secondary | ICD-10-CM | POA: Diagnosis not present

## 2017-01-19 DIAGNOSIS — Z578 Occupational exposure to other risk factors: Secondary | ICD-10-CM | POA: Insufficient documentation

## 2017-01-19 DIAGNOSIS — R42 Dizziness and giddiness: Secondary | ICD-10-CM | POA: Diagnosis not present

## 2017-01-19 DIAGNOSIS — R531 Weakness: Secondary | ICD-10-CM | POA: Diagnosis not present

## 2017-01-19 DIAGNOSIS — I10 Essential (primary) hypertension: Secondary | ICD-10-CM | POA: Insufficient documentation

## 2017-01-19 DIAGNOSIS — Z79899 Other long term (current) drug therapy: Secondary | ICD-10-CM | POA: Insufficient documentation

## 2017-01-19 DIAGNOSIS — R110 Nausea: Secondary | ICD-10-CM | POA: Diagnosis not present

## 2017-01-19 LAB — CBC WITH DIFFERENTIAL/PLATELET
BASOS ABS: 0 10*3/uL (ref 0.0–0.1)
Basophils Relative: 1 %
EOS ABS: 0.1 10*3/uL (ref 0.0–0.7)
EOS PCT: 1 %
HCT: 46 % (ref 39.0–52.0)
Hemoglobin: 15.9 g/dL (ref 13.0–17.0)
LYMPHS PCT: 19 %
Lymphs Abs: 1.3 10*3/uL (ref 0.7–4.0)
MCH: 30.1 pg (ref 26.0–34.0)
MCHC: 34.6 g/dL (ref 30.0–36.0)
MCV: 87.1 fL (ref 78.0–100.0)
Monocytes Absolute: 0.5 10*3/uL (ref 0.1–1.0)
Monocytes Relative: 7 %
Neutro Abs: 4.7 10*3/uL (ref 1.7–7.7)
Neutrophils Relative %: 72 %
PLATELETS: 178 10*3/uL (ref 150–400)
RBC: 5.28 MIL/uL (ref 4.22–5.81)
RDW: 12.6 % (ref 11.5–15.5)
WBC: 6.5 10*3/uL (ref 4.0–10.5)

## 2017-01-19 LAB — URINALYSIS, DIPSTICK ONLY
BILIRUBIN URINE: NEGATIVE
Glucose, UA: NEGATIVE mg/dL
Hgb urine dipstick: NEGATIVE
KETONES UR: NEGATIVE mg/dL
Leukocytes, UA: NEGATIVE
NITRITE: NEGATIVE
PH: 7 (ref 5.0–8.0)
Protein, ur: NEGATIVE mg/dL
Specific Gravity, Urine: 1.016 (ref 1.005–1.030)

## 2017-01-19 LAB — RAPID URINE DRUG SCREEN, HOSP PERFORMED
AMPHETAMINES: NOT DETECTED
Barbiturates: NOT DETECTED
Benzodiazepines: NOT DETECTED
Cocaine: NOT DETECTED
Opiates: NOT DETECTED
TETRAHYDROCANNABINOL: NOT DETECTED

## 2017-01-19 LAB — COMPREHENSIVE METABOLIC PANEL
ALBUMIN: 4.3 g/dL (ref 3.5–5.0)
ALT: 32 U/L (ref 17–63)
AST: 25 U/L (ref 15–41)
Alkaline Phosphatase: 56 U/L (ref 38–126)
Anion gap: 7 (ref 5–15)
BUN: 10 mg/dL (ref 6–20)
CHLORIDE: 105 mmol/L (ref 101–111)
CO2: 27 mmol/L (ref 22–32)
CREATININE: 0.91 mg/dL (ref 0.61–1.24)
Calcium: 9.1 mg/dL (ref 8.9–10.3)
GFR calc Af Amer: >60 mL/min (ref >60)
GFR calc non Af Amer: >60 mL/min (ref >60)
Glucose, Bld: 99 mg/dL (ref 65–99)
Potassium: 3.9 mmol/L (ref 3.5–5.1)
SODIUM: 139 mmol/L (ref 135–145)
Total Bilirubin: 0.7 mg/dL (ref 0.3–1.2)
Total Protein: 7.2 g/dL (ref 6.5–8.1)

## 2017-01-19 LAB — TROPONIN I: Troponin I: <0.03 ng/mL (ref <0.03)

## 2017-01-19 MED ORDER — ONDANSETRON HCL 4 MG/2ML IJ SOLN
4.0000 mg | Freq: Once | INTRAMUSCULAR | Status: AC
  Administered 2017-01-19: 4 mg via INTRAVENOUS
  Filled 2017-01-19: qty 2

## 2017-01-19 MED ORDER — SODIUM CHLORIDE 0.9 % IV BOLUS (SEPSIS)
500.0000 mL | Freq: Once | INTRAVENOUS | Status: AC
  Administered 2017-01-19: 500 mL via INTRAVENOUS
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
## 2017-01-19 NOTE — ED Notes (Signed)
Pt made aware for need for urine sample.

## 2017-01-19 NOTE — Discharge Instructions (Signed)
Rest at home tomorrow and follow-up with your family doctor next week if any problems.
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
## 2017-01-28 ENCOUNTER — Encounter: Payer: Self-pay | Admitting: Internal Medicine

## 2017-01-28 ENCOUNTER — Ambulatory Visit: Payer: BC Managed Care – PPO | Admitting: Internal Medicine

## 2017-01-28 VITALS — BP 122/84 | HR 86 | Temp 98.2°F | Wt >= 6400 oz

## 2017-01-28 DIAGNOSIS — S335XXA Sprain of ligaments of lumbar spine, initial encounter: Secondary | ICD-10-CM

## 2017-01-28 HISTORY — DX: Sprain of ligaments of lumbar spine, initial encounter: S33.5XXA

## 2017-01-28 MED ORDER — CYCLOBENZAPRINE HCL 10 MG PO TABS: 10.0000 mg | ORAL_TABLET | Freq: Two times a day (BID) | ORAL | 0 refills | Status: DC | PRN

## 2017-01-28 NOTE — Progress Notes (Signed)
Subjective:    Patient ID: [name] [name], male    DOB: [birth year], 46 y.o.   MRN: [id number]  HPI Here due to back pain  Was picking up a load of wood at home-- 2 days ago (for stove) Repetitive bending and lifting to put it in his truck He felt a pop and immediate pain and bending and twisting---he had to stop. 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 albuterol (ACCUNEB) 0.63 MG/3ML nebulizer solution Take 1 ampule by nebulization every 6 (six) hours as needed for wheezing or shortness of breath.    Marland Kitchen albuterol (PROAIR HFA) 108 (90 Base) MCG/ACT inhaler Inhale 2 puffs into the lungs every 6 (six) hours as needed for wheezing or shortness of breath. 1 Inhaler 3  . amLODipine (NORVASC) 5 MG tablet TAKE ONE TABLET BY MOUTH ONCE DAILY 90 tablet 2  . omeprazole (PRILOSEC) 20 MG capsule TAKE ONE CAPSULE BY MOUTH ONCE DAILY 30 capsule 5  . oxyCODONE-acetaminophen (PERCOCET/ROXICET) 5-325 MG tablet Take 1 tablet by mouth every 6 (six) hours as needed. 15 tablet 0  . Phenylephrine-DM-GG-APAP (TYLENOL COLD/FLU SEVERE PO) Take by mouth as needed.     No current facility-administered medications on file prior to visit.     Allergies  Allergen Reactions  . Bee Venom Anaphylaxis  . Codeine Anaphylaxis    Tolerates dilaudid  . Hornet Venom Anaphylaxis  . Penicillins Hives and Other (See Comments)    Has  patient had a PCN reaction causing immediate rash, facial/tongue/throat swelling, SOB or lightheadedness with hypotensionYES Has patient had a PCN reaction causing severe rash involving mucus membranes or skin necrosis: NO Has patient had a PCN reaction that required hospitalization NO Has patient had a PCN reaction occurring within the last 10 years: NO If all of the above answers are "NO", then may proceed with Cephalosporin use.    Past Medical History:  Diagnosis Date  . Asthma   . Bronchitis   . Chronic bronchitis ([place])   . Complication of anesthesia    usually wake up in the middle of procedure or surgery  . Gallstones   . GERD (gastroesophageal reflux disease)   . Hypertension   . Obesity   . Sleep apnea    "suppose to wear mask but I don't" (05/31/2015)    Past Surgical History:  Procedure Laterality Date  . CHOLECYSTECTOMY N/A 02/14/2015   Procedure: LAPAROSCOPIC CHOLECYSTECTOMY WITH INTRAOPERATIVE CHOLANGIOGRAM;  Surgeon: Coralie Keens, MD;  Location: [facility];  Service: General;  Laterality: N/A;  . COLONOSCOPY  10/16/2005   Polyps (tubular adenoma 2007), external hemorrhoids  . COLONOSCOPY WITH PROPOFOL N/A 01/05/2016   Procedure: COLONOSCOPY WITH PROPOFOL;  Surgeon: Milus Banister, MD;  Location: [facility];  Service: Endoscopy;  Laterality: N/A;  . FINGER SURGERY Left    reconstructive - 2 surgeries on hand  .  LAPAROSCOPIC GASTRIC SLEEVE RESECTION  10/2010    at [facility] History  Problem Relation Age of Onset  . Diabetes Mellitus II Mother   . Breast cancer Mother   . Ovarian cancer Mother   . Diabetes Mellitus II Father   . CAD Father   . Stroke Father   . Cancer Father        ?  . Clotting disorder Father        blood clots in brain with surgery  . Cirrhosis Maternal Grandmother   . Breast cancer Maternal Grandmother     Social History   Socioeconomic History  . Marital status: Married    Spouse name: Not on file  . Number of children: 1    . Years of education: Not on file  . Highest education level: Not on file  Social Needs  . Financial resource strain: Not on file  . Food insecurity - worry: Not on file  . Food insecurity - inability: Not on file  . Transportation needs - medical: Not on file  . Transportation needs - non-medical: Not on file  Occupational History  . Occupation: Publishing rights manager: [employer]  Tobacco Use  . Smoking status: Current Every Day Smoker    Packs/day: 3.00    Years: 0.00    Pack years: 0.00    Types: Cigarettes    Start date: 10/03/2016  . Smokeless tobacco: Current User    Types: Snuff  . Tobacco comment: Quit for 9 years and just restarted recently  Substance and Sexual Activity  . Alcohol use: Yes    Alcohol/week: 7.8 oz    Types: 13 Shots of liquor per week    Comment: "a couple of shots a day"  . Drug use: No  . Sexual activity: Yes  Other Topics Concern  . Not on file  Social History Narrative  . Not on file   Review of Systems Leg weakness 2 days ago--but not since No trouble with bowels or bladder control Appetite is fine    Objective:   Physical Exam  Musculoskeletal:  Marked tenderness along lumbar paraspinals on right Pain with SLR on right but not left Normal ROM of right hip Flexion of back almost 90 degrees (though with pain)  Neurological:  Stiff at first but loosens up No focal weakness in legs          Assessment & Plan:

## 2017-01-28 NOTE — Assessment & Plan Note (Signed)
No worrisome features to suggest HNP Not supposed to take NSAIDs Cyclobenzaprine for bedtime Heat OOW till 11/28

## 2017-04-11 ENCOUNTER — Ambulatory Visit: Payer: BC Managed Care – PPO | Admitting: Primary Care

## 2017-04-11 ENCOUNTER — Encounter: Payer: Self-pay | Admitting: Primary Care

## 2017-04-11 VITALS — BP 124/80 | HR 92 | Temp 98.3°F | Ht 73.25 in | Wt >= 6400 oz

## 2017-04-11 DIAGNOSIS — I10 Essential (primary) hypertension: Secondary | ICD-10-CM

## 2017-04-11 DIAGNOSIS — N529 Male erectile dysfunction, unspecified: Secondary | ICD-10-CM | POA: Diagnosis not present

## 2017-04-11 DIAGNOSIS — K219 Gastro-esophageal reflux disease without esophagitis: Secondary | ICD-10-CM | POA: Diagnosis not present

## 2017-04-11 DIAGNOSIS — G4733 Obstructive sleep apnea (adult) (pediatric): Secondary | ICD-10-CM | POA: Diagnosis not present

## 2017-04-11 HISTORY — DX: Male erectile dysfunction, unspecified: N52.9

## 2017-04-11 MED ORDER — OMEPRAZOLE 20 MG PO CPDR: 20.0000 mg | DELAYED_RELEASE_CAPSULE | Freq: Every day | ORAL | 1 refills | Status: DC

## 2017-04-11 MED ORDER — SILDENAFIL CITRATE 20 MG PO TABS: ORAL_TABLET | ORAL | 0 refills | Status: DC

## 2017-04-11 NOTE — Patient Instructions (Signed)
Start sildenafil 20 mg tablets. Take 2-5 tablets by mouth 30 minutes prior to intercourse.   Please schedule a physical with me anytime at your convenience. You may also schedule a lab only appointment 3-4 days prior. We will discuss your lab results in detail during your physical.  It was a pleasure to see you today!

## 2017-04-11 NOTE — Assessment & Plan Note (Signed)
Stable on omeprazole 20 mg, continue same. Encouraged weight loss and avoidance of trigger foods. Refills provided.

## 2017-04-11 NOTE — Assessment & Plan Note (Signed)
Stable in the office today, continue Amlodipine 5 mg. Discussed the importance of a healthy diet and regular exercise in order for weight loss, and to reduce the risk of any potential medical problems.

## 2017-04-11 NOTE — Assessment & Plan Note (Signed)
Likely secondary to obesity, hypertension, and increased stress. Discussed options for treatment as he'd like to pursue. Rx for sildenafil 20 mg tablets sent to pharmacy, discussed instructions for use.

## 2017-04-11 NOTE — Assessment & Plan Note (Addendum)
Has not worn CPAP machine in years, he is not intereted in pursing another machine.  Also encouraged weight loss.

## 2017-04-11 NOTE — Progress Notes (Signed)
Subjective:    Patient ID: [name] [name], male    DOB: January 27, 1971, 47 y.o.   MRN: [id number]  HPI  [name] [name] is a 47 year old male who presents today to transfer care from Dr. Deborra Medina.  1) Essential Hypertension: Currently managed on amlodipine 5 mg. He denies chest pain, dizziness, shortness of breath. He does not check his BP at home.   BP Readings from Last 3 Encounters:  04/11/17 124/80  01/28/17 122/84  01/19/17 (!) 156/108    2) OSA: Diagnosed years ago, does not wear a CPAP as he cannot stand the mask on his face. He's not worn a CPAP machine in years.   3) GERD: Currently managed on omeprazole 20 mg. He will experience chest pressure/pain, esophageal burning without his medication. He would like 90 day supply at a time.  4) Asthma: Diagnosed in childhood, no problems as an adult except during seasonal changes. He uses his nebulizer every 3-4 months on average during seasonal changes. He recently started smoking.  5) Erectile Dysfunction: Occurring for the past one year, gradual increase in symptoms over the last several months. He has trouble maintaining an erection. He has been under a lot of stress lately. He is interested in treatment.   Review of Systems  Respiratory: Negative for cough and shortness of breath.   Gastrointestinal:       GERD  Genitourinary:       Erectile dysfunction  Neurological: Negative for dizziness, weakness and headaches.       Past Medical History:  Diagnosis Date  . Asthma   . Bronchitis   . Chronic bronchitis ([place])   . Complication of anesthesia    usually wake up in the middle of procedure or surgery  . Gallstones   . GERD (gastroesophageal reflux disease)   . Hypertension   . Obesity   . Sleep apnea    "suppose to wear mask but I don't" (05/31/2015)     Social History   Socioeconomic History  . Marital status: Married    Spouse name: Not on file  . Number of children: 1  . Years of education: Not on file  . Highest  education level: Not on file  Social Needs  . Financial resource strain: Not on file  . Food insecurity - worry: Not on file  . Food insecurity - inability: Not on file  . Transportation needs - medical: Not on file  . Transportation needs - non-medical: Not on file  Occupational History  . Occupation: Publishing rights manager: [employer]  Tobacco Use  . Smoking status: Current Every Day Smoker    Packs/day: 3.00    Years: 0.00    Pack years: 0.00    Types: Cigarettes    Start date: 10/03/2016  . Smokeless tobacco: Current User    Types: Snuff  . Tobacco comment: Quit for 9 years and just restarted recently  Substance and Sexual Activity  . Alcohol use: Yes    Alcohol/week: 7.8 oz    Types: 13 Shots of liquor per week    Comment: "a couple of shots a day"  . Drug use: No  . Sexual activity: Yes  Other Topics Concern  . Not on file  Social History Narrative  . Not on file    Past Surgical History:  Procedure Laterality Date  . CHOLECYSTECTOMY N/A 02/14/2015   Procedure: LAPAROSCOPIC CHOLECYSTECTOMY WITH INTRAOPERATIVE CHOLANGIOGRAM;  Surgeon: Coralie Keens, MD;  Location: [facility];  Service:  General;  Laterality: N/A;  . COLONOSCOPY  10/16/2005   Polyps (tubular adenoma 2007), external hemorrhoids  . COLONOSCOPY WITH PROPOFOL N/A 01/05/2016   Procedure: COLONOSCOPY WITH PROPOFOL;  Surgeon: Milus Banister, MD;  Location: [facility];  Service: Endoscopy;  Laterality: N/A;  . FINGER SURGERY Left    reconstructive - 2 surgeries on hand  . LAPAROSCOPIC GASTRIC SLEEVE RESECTION  10/2010    at [facility] History  Problem Relation Age of Onset  . Diabetes Mellitus II Mother   . Breast cancer Mother   . Ovarian cancer Mother   . Diabetes Mellitus II Father   . CAD Father   . Stroke Father   . Cancer Father        ?  . Clotting disorder Father        blood clots in brain with surgery  . Cirrhosis Maternal Grandmother   . Breast cancer Maternal  Grandmother     Allergies  Allergen Reactions  . Bee Venom Anaphylaxis  . Codeine Anaphylaxis    Tolerates dilaudid  . Hornet Venom Anaphylaxis  . Penicillins Hives and Other (See Comments)    Has patient had a PCN reaction causing immediate rash, facial/tongue/throat swelling, SOB or lightheadedness with hypotensionYES Has patient had a PCN reaction causing severe rash involving mucus membranes or skin necrosis: NO Has patient had a PCN reaction that required hospitalization NO Has patient had a PCN reaction occurring within the last 10 years: NO If all of the above answers are "NO", then may proceed with Cephalosporin use.    Current Outpatient Medications on File Prior to Visit  Medication Sig Dispense Refill  . albuterol (ACCUNEB) 0.63 MG/3ML nebulizer solution Take 1 ampule by nebulization every 6 (six) hours as needed for wheezing or shortness of breath.    Marland Kitchen albuterol (PROAIR HFA) 108 (90 Base) MCG/ACT inhaler Inhale 2 puffs into the lungs every 6 (six) hours as needed for wheezing or shortness of breath. 1 Inhaler 3  . amLODipine (NORVASC) 5 MG tablet TAKE ONE TABLET BY MOUTH ONCE DAILY 90 tablet 2  . Phenylephrine-DM-GG-APAP (TYLENOL COLD/FLU SEVERE PO) Take by mouth as needed.     No current facility-administered medications on file prior to visit.     BP 124/80   Pulse 92   Temp 98.3 F (36.8 C) (Oral)   Ht 6' 1.25" (1.861 m)   Wt (!) 410 lb 12 oz (186.3 kg)   SpO2 98%   BMI 53.82 kg/m    Objective:   Physical Exam  Constitutional: He is oriented to person, place, and time. He appears well-nourished.  Neck: Neck supple.  Cardiovascular: Normal rate and regular rhythm.  Pulmonary/Chest: Effort normal and breath sounds normal. He has no wheezes. He has no rales.  Neurological: He is alert and oriented to person, place, and time.  Skin: Skin is warm and dry.  Psychiatric: He has a normal mood and affect.          Assessment & Plan:

## 2017-05-10 ENCOUNTER — Ambulatory Visit: Payer: BC Managed Care – PPO | Admitting: Family Medicine

## 2017-05-10 ENCOUNTER — Encounter: Payer: Self-pay | Admitting: Family Medicine

## 2017-05-10 VITALS — BP 136/84 | HR 82 | Temp 98.1°F | Wt >= 6400 oz

## 2017-05-10 DIAGNOSIS — J0100 Acute maxillary sinusitis, unspecified: Secondary | ICD-10-CM | POA: Diagnosis not present

## 2017-05-10 DIAGNOSIS — J410 Simple chronic bronchitis: Secondary | ICD-10-CM

## 2017-05-10 DIAGNOSIS — J22 Unspecified acute lower respiratory infection: Secondary | ICD-10-CM | POA: Diagnosis not present

## 2017-05-10 MED ORDER — ALBUTEROL SULFATE 0.63 MG/3ML IN NEBU: 1.0000 | INHALATION_SOLUTION | Freq: Four times a day (QID) | RESPIRATORY_TRACT | 3 refills | Status: DC | PRN

## 2017-05-10 MED ORDER — DOXYCYCLINE HYCLATE 100 MG PO TABS: 100.0000 mg | ORAL_TABLET | Freq: Two times a day (BID) | ORAL | 0 refills | Status: AC

## 2017-05-10 MED ORDER — BENZONATATE 100 MG PO CAPS: 100.0000 mg | ORAL_CAPSULE | Freq: Three times a day (TID) | ORAL | 0 refills | Status: DC | PRN

## 2017-05-10 NOTE — Progress Notes (Signed)
Subjective:    Patient ID: [name] [name], male    DOB: Dec 18, 1970, 47 y.o.   MRN: [id number]  HPI [name] [name] is  47 y.o. male who presents today with a CC of nasal congestion. He also has been experiencing a cough, facial pressure, chest congestion, and clear nasal drainage. Symptoms started about a week ago after being around his sick 47y.o. that was diagnosed with sinusitis. Has been taking Mucinex and Tylenol Cold and Sinus with no relief. He has also been using his inhaler 3-4 times a day. He appears ill looking and if diaphoretic. Denies fever but reports having chills  Review of Systems  Constitutional: Positive for activity change and fatigue.  HENT: Positive for rhinorrhea and sinus pressure. Negative for postnasal drip.   Respiratory: Positive for shortness of breath.   Cardiovascular: Negative for chest pain.       Past Medical History:  Diagnosis Date  . Asthma   . Bronchitis   . Chronic bronchitis ([place])   . Complication of anesthesia    usually wake up in the middle of procedure or surgery  . Gallstones   . GERD (gastroesophageal reflux disease)   . Hypertension   . Obesity   . Sleep apnea    "suppose to wear mask but I don't" (05/31/2015)   Past Surgical History:  Procedure Laterality Date  . CHOLECYSTECTOMY N/A 02/14/2015   Procedure: LAPAROSCOPIC CHOLECYSTECTOMY WITH INTRAOPERATIVE CHOLANGIOGRAM;  Surgeon: Coralie Keens, MD;  Location: [facility];  Service: General;  Laterality: N/A;  . COLONOSCOPY  10/16/2005   Polyps (tubular adenoma 2007), external hemorrhoids  . COLONOSCOPY WITH PROPOFOL N/A 01/05/2016   Procedure: COLONOSCOPY WITH PROPOFOL;  Surgeon: Milus Banister, MD;  Location: [facility];  Service: Endoscopy;  Laterality: N/A;  . FINGER SURGERY Left    reconstructive - 2 surgeries on hand  . LAPAROSCOPIC GASTRIC SLEEVE RESECTION  10/2010    at [facility] History  Problem Relation Age of Onset  . Diabetes Mellitus II Mother   . Breast cancer  Mother   . Ovarian cancer Mother   . Diabetes Mellitus II Father   . CAD Father   . Stroke Father   . Cancer Father        ?  . Clotting disorder Father        blood clots in brain with surgery  . Cirrhosis Maternal Grandmother   . Breast cancer Maternal Grandmother    Social History   Socioeconomic History  . Marital status: Married    Spouse name: Not on file  . Number of children: 1  . Years of education: Not on file  . Highest education level: Not on file  Social Needs  . Financial resource strain: Not on file  . Food insecurity - worry: Not on file  . Food insecurity - inability: Not on file  . Transportation needs - medical: Not on file  . Transportation needs - non-medical: Not on file  Occupational History  . Occupation: Publishing rights manager: [employer]  Tobacco Use  . Smoking status: Current Every Day Smoker    Packs/day: 3.00    Years: 0.00    Pack years: 0.00    Types: Cigarettes    Start date: 10/03/2016  . Smokeless tobacco: Current User    Types: Snuff  . Tobacco comment: Quit for 9 years and just restarted recently  Substance and Sexual Activity  . Alcohol use: Yes    Alcohol/week: 7.8 oz  Types: 13 Shots of liquor per week    Comment: "a couple of shots a day"  . Drug use: No  . Sexual activity: Yes  Other Topics Concern  . Not on file  Social History Narrative  . Not on file   Current Outpatient Medications on File Prior to Visit  Medication Sig Dispense Refill  . albuterol (ACCUNEB) 0.63 MG/3ML nebulizer solution Take 1 ampule by nebulization every 6 (six) hours as needed for wheezing or shortness of breath.    Marland Kitchen albuterol (PROAIR HFA) 108 (90 Base) MCG/ACT inhaler Inhale 2 puffs into the lungs every 6 (six) hours as needed for wheezing or shortness of breath. 1 Inhaler 3  . amLODipine (NORVASC) 5 MG tablet TAKE ONE TABLET BY MOUTH ONCE DAILY 90 tablet 2  . omeprazole (PRILOSEC) 20 MG capsule Take 1 capsule (20 mg total) by  mouth daily. 90 capsule 1  . Phenylephrine-DM-GG-APAP (TYLENOL COLD/FLU SEVERE PO) Take by mouth as needed.    . sildenafil (REVATIO) 20 MG tablet Take 2-5 tablets by mouth 30 minutes prior to intercourse. 50 tablet 0   No current facility-administered medications on file prior to visit.     Objective:   Physical Exam  Constitutional: He appears well-nourished. He appears ill.  HENT:  Right Ear: Tympanic membrane is bulging.  Left Ear: Tympanic membrane is bulging.  Nose: Mucosal edema present. Right sinus exhibits no maxillary sinus tenderness and no frontal sinus tenderness. Left sinus exhibits no maxillary sinus tenderness and no frontal sinus tenderness.  Mouth/Throat: Oropharynx is clear and moist and mucous membranes are normal.  Cardiovascular: Normal rate and regular rhythm. Exam reveals no gallop.  No murmur heard. Pulmonary/Chest: He has rales in the right lower field and the left lower field.  Crackles cleared upon deep inspiration  Lymphadenopathy:    He has no cervical adenopathy.    BP 136/84   Pulse 82   Temp 98.1 F (36.7 C) (Oral)   Wt (!) 410 lb (186 kg)   SpO2 97%   BMI 53.72 kg/m       Assessment & Plan:  Acute non-recurrent maxillary sinusitis - Plan: doxycycline (VIBRA-TABS) 100 MG tablet  Lower respiratory infection - Plan: doxycycline (VIBRA-TABS) 100 MG tablet, benzonatate (TESSALON) 100 MG capsule  Simple chronic bronchitis (HCC) Albuterol Nebs reordered and sent to pharmacy  You may also take Tylenol or Ibuprofen for fever, chills, and pain relief per manufacturer's recommendations Continue taking Mucinex daily till symptoms clear.  Start taking an antihistamine every day- generic Zyrtec, Allegra or Claritin Can take Tessalon Pearls for cough relief as needed per manufacturer's recommendation Be sure to drink 64oz of water every day.  Need to take off work till Monday. Please follow up I no symptom relief by Monday or sooner if symptoms  worsen.  Denita Lung, RN, Nurse Practitioner Student

## 2017-05-10 NOTE — Progress Notes (Signed)
Subjective:    Patient ID: [name] [name], male    DOB: September 07, 1970, 47 y.o.   MRN: [id number]  HPI This is a 47 yo male who presents today with 1 week of sinus congestion, little drainage, facial pressure, dry cough and DOE. Has history of asthma and heavy smoking. Has been using albuterol inhaler/neb several times a day with temporary relief. Subjective fever/chills. Feels terrible. Son also has been sick recently with congestion and cough.    Past Medical History:  Diagnosis Date  . Asthma   . Bronchitis   . Chronic bronchitis ([place])   . Complication of anesthesia    usually wake up in the middle of procedure or surgery  . Gallstones   . GERD (gastroesophageal reflux disease)   . Hypertension   . Obesity   . Sleep apnea    "suppose to wear mask but I don't" (05/31/2015)   Past Surgical History:  Procedure Laterality Date  . CHOLECYSTECTOMY N/A 02/14/2015   Procedure: LAPAROSCOPIC CHOLECYSTECTOMY WITH INTRAOPERATIVE CHOLANGIOGRAM;  Surgeon: Coralie Keens, MD;  Location: [facility];  Service: General;  Laterality: N/A;  . COLONOSCOPY  10/16/2005   Polyps (tubular adenoma 2007), external hemorrhoids  . COLONOSCOPY WITH PROPOFOL N/A 01/05/2016   Procedure: COLONOSCOPY WITH PROPOFOL;  Surgeon: Milus Banister, MD;  Location: [facility];  Service: Endoscopy;  Laterality: N/A;  . FINGER SURGERY Left    reconstructive - 2 surgeries on hand  . LAPAROSCOPIC GASTRIC SLEEVE RESECTION  10/2010    at [facility] History  Problem Relation Age of Onset  . Diabetes Mellitus II Mother   . Breast cancer Mother   . Ovarian cancer Mother   . Diabetes Mellitus II Father   . CAD Father   . Stroke Father   . Cancer Father        ?  . Clotting disorder Father        blood clots in brain with surgery  . Cirrhosis Maternal Grandmother   . Breast cancer Maternal Grandmother    Social History   Tobacco Use  . Smoking status: Current Every Day Smoker    Packs/day: 3.00    Years: 0.00   Pack years: 0.00    Types: Cigarettes    Start date: 10/03/2016  . Smokeless tobacco: Current User    Types: Snuff  . Tobacco comment: Quit for 9 years and just restarted recently  Substance Use Topics  . Alcohol use: Yes    Alcohol/week: 7.8 oz    Types: 13 Shots of liquor per week    Comment: "a couple of shots a day"  . Drug use: No      Review of Systems Per HPI    Objective:   Physical Exam  Constitutional: He is oriented to person, place, and time. He appears well-developed and well-nourished. He appears ill. No distress.  HENT:  Head: Normocephalic and atraumatic.  Right Ear: External ear and ear canal normal. Tympanic membrane is bulging. Tympanic membrane is not erythematous.  Left Ear: External ear and ear canal normal. Tympanic membrane is bulging. Tympanic membrane is not erythematous.  Nose: Mucosal edema present. Right sinus exhibits maxillary sinus tenderness. Right sinus exhibits no frontal sinus tenderness. Left sinus exhibits maxillary sinus tenderness. Left sinus exhibits no frontal sinus tenderness.  Mouth/Throat: Uvula is midline and mucous membranes are normal. Posterior oropharyngeal erythema present. No oropharyngeal exudate or posterior oropharyngeal edema.  Audible nasal congestion.    Eyes: Conjunctivae are normal.  Neck:  Normal range of motion. Neck supple.  Cardiovascular: Normal rate, regular rhythm and normal heart sounds.  Pulmonary/Chest: Effort normal and breath sounds normal. No respiratory distress. He has no wheezes. He has no rales.  Student auscultated lung sounds prior to my exam and heard bilateral basilar crackles that resolved with cough.   Lymphadenopathy:    He has no cervical adenopathy.  Neurological: He is alert and oriented to person, place, and time.  Skin: Skin is warm. He is diaphoretic.  Psychiatric: He has a normal mood and affect. His behavior is normal. Judgment and thought content normal.  Vitals reviewed.     BP  136/84   Pulse 82   Temp 98.1 F (36.7 C) (Oral)   Wt (!) 410 lb (186 kg)   SpO2 97%   BMI 53.72 kg/m  Wt Readings from Last 3 Encounters:  05/10/17 (!) 410 lb (186 kg)  04/11/17 (!) 410 lb 12 oz (186.3 kg)  01/28/17 (!) 414 lb (187.8 kg)       Assessment & Plan:  1. Acute non-recurrent maxillary sinusitis - Provided written and verbal information regarding diagnosis and treatment. - doxycycline (VIBRA-TABS) 100 MG tablet; Take 1 tablet (100 mg total) by mouth 2 (two) times daily for 7 days.  Dispense: 14 tablet; Refill: 0  2. Lower respiratory infection - Strict RTC/ER precautions reviewed - doxycycline (VIBRA-TABS) 100 MG tablet; Take 1 tablet (100 mg total) by mouth 2 (two) times daily for 7 days.  Dispense: 14 tablet; Refill: 0 - benzonatate (TESSALON) 100 MG capsule; Take 1-2 capsules (100-200 mg total) by mouth 3 (three) times daily as needed for cough.  Dispense: 20 capsule; Refill: 0 - encouraged smoking cessation and to limit ETOH to no more than 2 servings per day  3. Simple chronic bronchitis ([place]) - refilled albuterol neb   Clarene Reamer, FNP-BC  [facility]  05/11/2017 12:51 PM

## 2017-05-10 NOTE — Patient Instructions (Addendum)
You have a sinus infection and lower respiratory infection. We are prescribing you Doxycycline 2 tabs for 7 days, complete all 7 days. You may also take Tylenol or Ibuprofen for fever, chills, and pain relief per manufacturer's recommendations Continue taking Mucinex daily till symptoms clear.  Start taking an antihistamine every day- generic Zyrtec, Allegra or Claritin Can take Tessalon Pearls for cough relief as needed per manufacturer's recommendation Be sure to drink 64oz of water every day.  Need to take off work till Monday. Please follow up I no symptom relief by Monday or sooner if symptoms worsen.   Need to stop smoking as this exacerbates disease and chronic bronchitis  Limit yourself to 2 alcoholic drinks per day  Nebulizer re-ordered  It has been a pleasure seeing you today, Denita Lung, RN, Nurse Practitioner Student and Tor Netters, FNP   Smoking Tobacco Information Smoking tobacco will very likely harm your health. Tobacco contains a poisonous (toxic), colorless chemical called nicotine. Nicotine affects the brain and makes tobacco addictive. This change in your brain can make it hard to stop smoking. Tobacco also has other toxic chemicals that can hurt your body and raise your risk of many cancers. How can smoking tobacco affect me? Smoking tobacco can increase your chances of having serious health conditions, such as:  Cancer. Smoking is most commonly associated with lung cancer, but can lead to cancer in other parts of the body.  Chronic obstructive pulmonary disease (COPD). This is a long-term lung condition that makes it hard to breathe. It also gets worse over time.  High blood pressure (hypertension), heart disease, stroke, or heart attack.  Lung infections, such as pneumonia.  Cataracts. This is when the lenses in the eyes become clouded.  Digestive problems. This may include peptic ulcers, heartburn, and gastroesophageal reflux disease  (GERD).  Oral health problems, such as gum disease and tooth loss.  Loss of taste and smell.  Smoking can affect your appearance by causing:  Wrinkles.  Yellow or stained teeth, fingers, and fingernails.  Smoking tobacco can also affect your social life.  Many workplaces, [employer], hotels, and public places are tobacco-free. This means that you may experience challenges in finding places to smoke when away from home.  The cost of a smoking habit can be expensive. Expenses for someone who smokes come in two ways: ? You spend money on a regular basis to buy tobacco. ? Your health care costs in the long-term are higher if you smoke.  Tobacco smoke can also affect the health of those around you. Children of smokers have greater chances of: ? Sudden infant death syndrome (SIDS). ? Ear infections. ? Lung infections.  What lifestyle changes can be made?  Do not start smoking. Quit if you already do.  To quit smoking: ? Make a plan to quit smoking and commit yourself to it. Look for programs to help you and ask your health care provider for recommendations and ideas. ? Talk with your health care provider about using nicotine replacement medicines to help you quit. Medicine replacement medicines include gum, lozenges, patches, sprays, or pills. ? Do not replace cigarette smoking with electronic cigarettes, which are commonly called e-cigarettes. The safety of e-cigarettes is not known, and some may contain harmful chemicals. ? Avoid places, people, or situations that tempt you to smoke. ? If you try to quit but return to smoking, don't give up hope. It is very common for people to try a number of times before they fully  succeed. When you feel ready again, give it another try.  Quitting smoking might affect the way you eat as well as your weight. Be prepared to monitor your eating habits. Get support in planning and following a healthy diet.  Ask your health care provider about having  regular tests (screenings) to check for cancer. This may include blood tests, imaging tests, and other tests.  Exercise regularly. Consider taking walks, joining a gym, or doing yoga or exercise classes.  Develop skills to manage your stress. These skills include meditation. What are the benefits of quitting smoking? By quitting smoking, you may:  Lower your risk of getting cancer and other diseases caused by smoking.  Live longer.  Breathe better.  Lower your blood pressure and heart rate.  Stop your addiction to tobacco.  Stop creating secondhand smoke that hurts other people.  Improve your sense of taste and smell.  Look better over time, due to having fewer wrinkles and less staining.  What can happen if changes are not made? If you do not stop smoking, you may:  Get cancer and other diseases.  Develop COPD or other long-term (chronic) lung conditions.  Develop serious problems with your heart and blood vessels (cardiovascular system).  Need more tests to screen for problems caused by smoking.  Have higher, long-term healthcare costs from medicines or treatments related to smoking.  Continue to have worsening changes in your lungs, mouth, and nose.  Where to find support: To get support to quit smoking, consider:  Asking your health care provider for more information and resources.  Taking classes to learn more about quitting smoking.  Looking for local organizations that offer resources about quitting smoking.  Joining a support group for people who want to quit smoking in your local community.  Where to find more information: You may find more information about quitting smoking from:  HelpGuide.org: www.helpguide.org/articles/addictions/how-to-quit-smoking.htm  https://hall.com/: smokefree.gov  American Lung Association: www.lung.org  Contact a health care provider if:  You have problems breathing.  Your lips, nose, or fingers turn blue.  You have  chest pain.  You are coughing up blood.  You feel faint or you pass out.  You have other noticeable changes that cause you to worry. Summary  Smoking tobacco can negatively affect your health, the health of those around you, your finances, and your social life.  Do not start smoking. Quit if you already do. If you need help quitting, ask your health care provider.  Think about joining a support group for people who want to quit smoking in your local community. There are many effective programs that will help you to quit this behavior. This information is not intended to replace advice given to you by your health care provider. Make sure you discuss any questions you have with your health care provider. Document Released: 03/06/2016 Document Revised: 03/06/2016 Document Reviewed: 03/06/2016 Elsevier Interactive Patient Education  2018 [employer].  Sinusitis, Adult Sinusitis is soreness and inflammation of your sinuses. Sinuses are hollow spaces in the bones around your face. They are located:  Around your eyes.  In the middle of your forehead.  Behind your nose.  In your cheekbones.  Your sinuses and nasal passages are lined with a stringy fluid (mucus). Mucus normally drains out of your sinuses. When your nasal tissues get inflamed or swollen, the mucus can get trapped or blocked so air cannot flow through your sinuses. This lets bacteria, viruses, and funguses grow, and that leads to infection. Follow these  instructions at home: Medicines  Take, use, or apply over-the-counter and prescription medicines only as told by your doctor. These may include nasal sprays.  If you were prescribed an antibiotic medicine, take it as told by your doctor. Do not stop taking the antibiotic even if you start to feel better. Hydrate and Humidify  Drink enough water to keep your pee (urine) clear or pale yellow.  Use a cool mist humidifier to keep the humidity level in your home above  50%.  Breathe in steam for 10-15 minutes, 3-4 times a day or as told by your doctor. You can do this in the bathroom while a hot shower is running.  Try not to spend time in cool or dry air. Rest  Rest as much as possible.  Sleep with your head raised (elevated).  Make sure to get enough sleep each night. General instructions  Put a warm, moist washcloth on your face 3-4 times a day or as told by your doctor. This will help with discomfort.  Wash your hands often with soap and water. If there is no soap and water, use hand sanitizer.  Do not smoke. Avoid being around people who are smoking (secondhand smoke).  Keep all follow-up visits as told by your doctor. This is important. Contact a doctor if:  You have a fever.  Your symptoms get worse.  Your symptoms do not get better within 10 days. Get help right away if:  You have a very bad headache.  You cannot stop throwing up (vomiting).  You have pain or swelling around your face or eyes.  You have trouble seeing.  You feel confused.  Your neck is stiff.  You have trouble breathing. This information is not intended to replace advice given to you by your health care provider. Make sure you discuss any questions you have with your health care provider. Document Released: 08/08/2007 Document Revised: 10/16/2015 Document Reviewed: 12/15/2014 Elsevier Interactive Patient Education  [employer].

## 2017-05-11 ENCOUNTER — Encounter: Payer: Self-pay | Admitting: Family Medicine

## 2017-06-25 ENCOUNTER — Emergency Department (HOSPITAL_COMMUNITY): Payer: BC Managed Care – PPO

## 2017-06-25 ENCOUNTER — Encounter (HOSPITAL_COMMUNITY): Payer: Self-pay

## 2017-06-25 ENCOUNTER — Inpatient Hospital Stay (HOSPITAL_COMMUNITY)
Admission: EM | Admit: 2017-06-25 | Discharge: 2017-06-29 | DRG: 389 | Disposition: A | Discharge status: Home / Self Care | Payer: BC Managed Care – PPO | Attending: Family Medicine | Admitting: Family Medicine

## 2017-06-25 DIAGNOSIS — N179 Acute kidney failure, unspecified: Secondary | ICD-10-CM | POA: Diagnosis present

## 2017-06-25 DIAGNOSIS — Z9049 Acquired absence of other specified parts of digestive tract: Secondary | ICD-10-CM

## 2017-06-25 DIAGNOSIS — I10 Essential (primary) hypertension: Secondary | ICD-10-CM | POA: Diagnosis present

## 2017-06-25 DIAGNOSIS — K219 Gastro-esophageal reflux disease without esophagitis: Secondary | ICD-10-CM | POA: Diagnosis present

## 2017-06-25 DIAGNOSIS — K56609 Unspecified intestinal obstruction, unspecified as to partial versus complete obstruction: Secondary | ICD-10-CM | POA: Diagnosis present

## 2017-06-25 DIAGNOSIS — Z9884 Bariatric surgery status: Secondary | ICD-10-CM

## 2017-06-25 DIAGNOSIS — Z833 Family history of diabetes mellitus: Secondary | ICD-10-CM

## 2017-06-25 DIAGNOSIS — J449 Chronic obstructive pulmonary disease, unspecified: Secondary | ICD-10-CM | POA: Diagnosis present

## 2017-06-25 DIAGNOSIS — Z8249 Family history of ischemic heart disease and other diseases of the circulatory system: Secondary | ICD-10-CM

## 2017-06-25 DIAGNOSIS — E860 Dehydration: Secondary | ICD-10-CM | POA: Diagnosis present

## 2017-06-25 DIAGNOSIS — K56600 Partial intestinal obstruction, unspecified as to cause: Secondary | ICD-10-CM | POA: Diagnosis not present

## 2017-06-25 DIAGNOSIS — G4730 Sleep apnea, unspecified: Secondary | ICD-10-CM | POA: Diagnosis present

## 2017-06-25 DIAGNOSIS — Z79899 Other long term (current) drug therapy: Secondary | ICD-10-CM

## 2017-06-25 DIAGNOSIS — R112 Nausea with vomiting, unspecified: Secondary | ICD-10-CM | POA: Diagnosis not present

## 2017-06-25 DIAGNOSIS — F17210 Nicotine dependence, cigarettes, uncomplicated: Secondary | ICD-10-CM | POA: Diagnosis present

## 2017-06-25 DIAGNOSIS — R109 Unspecified abdominal pain: Secondary | ICD-10-CM

## 2017-06-25 DIAGNOSIS — J42 Unspecified chronic bronchitis: Secondary | ICD-10-CM | POA: Diagnosis present

## 2017-06-25 DIAGNOSIS — R52 Pain, unspecified: Secondary | ICD-10-CM

## 2017-06-25 DIAGNOSIS — J45909 Unspecified asthma, uncomplicated: Secondary | ICD-10-CM | POA: Diagnosis present

## 2017-06-25 DIAGNOSIS — J441 Chronic obstructive pulmonary disease with (acute) exacerbation: Secondary | ICD-10-CM | POA: Diagnosis present

## 2017-06-25 DIAGNOSIS — Z6841 Body Mass Index (BMI) 40.0 and over, adult: Secondary | ICD-10-CM

## 2017-06-25 LAB — COMPREHENSIVE METABOLIC PANEL
ALT: 29 U/L (ref 17–63)
ANION GAP: 13 (ref 5–15)
AST: 26 U/L (ref 15–41)
Albumin: 4.3 g/dL (ref 3.5–5.0)
Alkaline Phosphatase: 67 U/L (ref 38–126)
BILIRUBIN TOTAL: 1.1 mg/dL (ref 0.3–1.2)
BUN: 8 mg/dL (ref 6–20)
CO2: 24 mmol/L (ref 22–32)
Calcium: 9.5 mg/dL (ref 8.9–10.3)
Chloride: 100 mmol/L — ABNORMAL LOW (ref 101–111)
Creatinine, Ser: 1.32 mg/dL — ABNORMAL HIGH (ref 0.61–1.24)
GFR calc Af Amer: >60 mL/min (ref >60)
Glucose, Bld: 102 mg/dL — ABNORMAL HIGH (ref 65–99)
POTASSIUM: 3.9 mmol/L (ref 3.5–5.1)
Sodium: 137 mmol/L (ref 135–145)
TOTAL PROTEIN: 8.2 g/dL — AB (ref 6.5–8.1)

## 2017-06-25 LAB — CBC
HEMATOCRIT: 52.1 % — AB (ref 39.0–52.0)
HEMOGLOBIN: 18.2 g/dL — AB (ref 13.0–17.0)
MCH: 30.4 pg (ref 26.0–34.0)
MCHC: 34.9 g/dL (ref 30.0–36.0)
MCV: 87.1 fL (ref 78.0–100.0)
Platelets: 203 10*3/uL (ref 150–400)
RBC: 5.98 MIL/uL — AB (ref 4.22–5.81)
RDW: 12.9 % (ref 11.5–15.5)
WBC: 5.2 10*3/uL (ref 4.0–10.5)

## 2017-06-25 LAB — LIPASE, BLOOD: LIPASE: 24 U/L (ref 11–51)

## 2017-06-25 MED ORDER — IOPAMIDOL (ISOVUE-300) INJECTION 61%
INTRAVENOUS | Status: AC
  Filled 2017-06-25: qty 100

## 2017-06-25 MED ORDER — IOPAMIDOL (ISOVUE-300) INJECTION 61%
100.0000 mL | Freq: Once | INTRAVENOUS | Status: AC | PRN
  Administered 2017-06-25: 100 mL via INTRAVENOUS

## 2017-06-25 MED ORDER — ONDANSETRON 4 MG PO TBDP
4.0000 mg | ORAL_TABLET | Freq: Once | ORAL | Status: AC | PRN
  Administered 2017-06-25: 4 mg via ORAL
  Filled 2017-06-25: qty 1

## 2017-06-25 NOTE — ED Triage Notes (Signed)
PT reports abdominal pain and emesis since Saturday. 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

## 2017-06-25 NOTE — ED Provider Notes (Signed)
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  Developed nausea, vomiting, diarrhea yesterday.  Emesis is nonbloody but bilious per the patient and he notes that his stool is watery and has a green tinge to it.  Denies urinary symptoms, fevers, or chills.  Pain is generalized, worst in the right upper quadrant.  States he has not tolerated any food or fluids since Sunday  ROS: Positive for abdominal pain, nausea, vomiting, diarrhea negative for melena, hematochezia, fevers  Physical Exam:   Gen: Appears uncomfortable in chair, mildly diaphoretic  Neuro: Awake and Alert  Skin: Warm    Focused Exam: Hypoactive bowel sounds, well-healed surgical incisions.  Generalized tenderness to palpation, maximally tender in the right upper quadrant.  Right CVA tenderness noted.   Initiation of care has begun. The patient has been counseled on the process, plan, and necessity for staying for the completion/evaluation, and the remainder of the medical screening examination    Debroah Baller 06/25/17 1638    Julianne Rice, MD 06/27/17 1009

## 2017-06-26 ENCOUNTER — Other Ambulatory Visit: Payer: Self-pay

## 2017-06-26 ENCOUNTER — Observation Stay (HOSPITAL_COMMUNITY): Payer: BC Managed Care – PPO

## 2017-06-26 ENCOUNTER — Encounter (HOSPITAL_COMMUNITY): Payer: Self-pay | Admitting: General Practice

## 2017-06-26 DIAGNOSIS — R112 Nausea with vomiting, unspecified: Secondary | ICD-10-CM | POA: Diagnosis present

## 2017-06-26 DIAGNOSIS — F17210 Nicotine dependence, cigarettes, uncomplicated: Secondary | ICD-10-CM | POA: Diagnosis present

## 2017-06-26 DIAGNOSIS — J441 Chronic obstructive pulmonary disease with (acute) exacerbation: Secondary | ICD-10-CM | POA: Diagnosis present

## 2017-06-26 DIAGNOSIS — I10 Essential (primary) hypertension: Secondary | ICD-10-CM | POA: Diagnosis present

## 2017-06-26 DIAGNOSIS — Z9049 Acquired absence of other specified parts of digestive tract: Secondary | ICD-10-CM | POA: Diagnosis not present

## 2017-06-26 DIAGNOSIS — K56600 Partial intestinal obstruction, unspecified as to cause: Secondary | ICD-10-CM | POA: Diagnosis present

## 2017-06-26 DIAGNOSIS — J449 Chronic obstructive pulmonary disease, unspecified: Secondary | ICD-10-CM | POA: Diagnosis present

## 2017-06-26 DIAGNOSIS — K56609 Unspecified intestinal obstruction, unspecified as to partial versus complete obstruction: Secondary | ICD-10-CM

## 2017-06-26 DIAGNOSIS — Z79899 Other long term (current) drug therapy: Secondary | ICD-10-CM | POA: Diagnosis not present

## 2017-06-26 DIAGNOSIS — Z8249 Family history of ischemic heart disease and other diseases of the circulatory system: Secondary | ICD-10-CM | POA: Diagnosis not present

## 2017-06-26 DIAGNOSIS — J42 Unspecified chronic bronchitis: Secondary | ICD-10-CM

## 2017-06-26 DIAGNOSIS — N179 Acute kidney failure, unspecified: Secondary | ICD-10-CM | POA: Diagnosis not present

## 2017-06-26 DIAGNOSIS — Z9884 Bariatric surgery status: Secondary | ICD-10-CM | POA: Diagnosis not present

## 2017-06-26 DIAGNOSIS — E860 Dehydration: Secondary | ICD-10-CM

## 2017-06-26 DIAGNOSIS — Z6841 Body Mass Index (BMI) 40.0 and over, adult: Secondary | ICD-10-CM | POA: Diagnosis not present

## 2017-06-26 DIAGNOSIS — K219 Gastro-esophageal reflux disease without esophagitis: Secondary | ICD-10-CM | POA: Diagnosis present

## 2017-06-26 DIAGNOSIS — Z833 Family history of diabetes mellitus: Secondary | ICD-10-CM | POA: Diagnosis not present

## 2017-06-26 DIAGNOSIS — G4730 Sleep apnea, unspecified: Secondary | ICD-10-CM | POA: Diagnosis present

## 2017-06-26 DIAGNOSIS — J45909 Unspecified asthma, uncomplicated: Secondary | ICD-10-CM | POA: Diagnosis present

## 2017-06-26 HISTORY — DX: Chronic obstructive pulmonary disease with (acute) exacerbation: J44.1

## 2017-06-26 HISTORY — DX: Unspecified intestinal obstruction, unspecified as to partial versus complete obstruction: K56.609

## 2017-06-26 HISTORY — DX: Acute kidney failure, unspecified: N17.9

## 2017-06-26 HISTORY — DX: Dehydration: E86.0

## 2017-06-26 LAB — CBC
HEMATOCRIT: 46.2 % (ref 39.0–52.0)
Hemoglobin: 16.3 g/dL (ref 13.0–17.0)
MCH: 30.4 pg (ref 26.0–34.0)
MCHC: 35.3 g/dL (ref 30.0–36.0)
MCV: 86.2 fL (ref 78.0–100.0)
Platelets: 153 10*3/uL (ref 150–400)
RBC: 5.36 MIL/uL (ref 4.22–5.81)
RDW: 12.8 % (ref 11.5–15.5)
WBC: 6.2 10*3/uL (ref 4.0–10.5)

## 2017-06-26 LAB — URINALYSIS, ROUTINE W REFLEX MICROSCOPIC
Bilirubin Urine: NEGATIVE
GLUCOSE, UA: NEGATIVE mg/dL
Hgb urine dipstick: NEGATIVE
Ketones, ur: 5 mg/dL — AB
LEUKOCYTES UA: NEGATIVE
NITRITE: NEGATIVE
Protein, ur: NEGATIVE mg/dL
SPECIFIC GRAVITY, URINE: 1.031 — AB (ref 1.005–1.030)
pH: 5 (ref 5.0–8.0)

## 2017-06-26 LAB — BASIC METABOLIC PANEL
Anion gap: 13 (ref 5–15)
BUN: 12 mg/dL (ref 6–20)
CO2: 20 mmol/L — AB (ref 22–32)
Calcium: 8.5 mg/dL — ABNORMAL LOW (ref 8.9–10.3)
Chloride: 102 mmol/L (ref 101–111)
Creatinine, Ser: 1.1 mg/dL (ref 0.61–1.24)
GFR calc non Af Amer: >60 mL/min (ref >60)
Glucose, Bld: 87 mg/dL (ref 65–99)
Potassium: 3.7 mmol/L (ref 3.5–5.1)
Sodium: 135 mmol/L (ref 135–145)

## 2017-06-26 LAB — HIV ANTIBODY (ROUTINE TESTING W REFLEX): HIV SCREEN 4TH GENERATION: NONREACTIVE

## 2017-06-26 MED ORDER — ONDANSETRON HCL 4 MG/2ML IJ SOLN
4.0000 mg | Freq: Once | INTRAMUSCULAR | Status: AC
  Administered 2017-06-26: 4 mg via INTRAVENOUS
  Filled 2017-06-26: qty 2

## 2017-06-26 MED ORDER — DEXTROSE-NACL 5-0.45 % IV SOLN
INTRAVENOUS | Status: DC
  Administered 2017-06-26 – 2017-06-28 (×4): via INTRAVENOUS

## 2017-06-26 MED ORDER — MORPHINE SULFATE (PF) 4 MG/ML IV SOLN
2.0000 mg | INTRAVENOUS | Status: DC | PRN
  Administered 2017-06-26 – 2017-06-28 (×5): 2 mg via INTRAVENOUS
  Filled 2017-06-26 (×5): qty 1

## 2017-06-26 MED ORDER — MORPHINE SULFATE (PF) 4 MG/ML IV SOLN
4.0000 mg | Freq: Once | INTRAVENOUS | Status: AC
  Administered 2017-06-26: 4 mg via INTRAVENOUS
  Filled 2017-06-26: qty 1

## 2017-06-26 MED ORDER — ALBUTEROL SULFATE (2.5 MG/3ML) 0.083% IN NEBU: 0.6300 mg | INHALATION_SOLUTION | Freq: Four times a day (QID) | RESPIRATORY_TRACT | Status: DC | PRN

## 2017-06-26 MED ORDER — SODIUM CHLORIDE 0.9 % IV BOLUS
1000.0000 mL | Freq: Once | INTRAVENOUS | Status: AC
  Administered 2017-06-26: 1000 mL via INTRAVENOUS

## 2017-06-26 MED ORDER — SODIUM CHLORIDE 0.9 % IV SOLN
INTRAVENOUS | Status: DC
  Administered 2017-06-26 (×2): via INTRAVENOUS

## 2017-06-26 MED ORDER — NICOTINE 14 MG/24HR TD PT24
14.0000 mg | MEDICATED_PATCH | Freq: Every day | TRANSDERMAL | Status: DC
  Administered 2017-06-26 – 2017-06-29 (×4): 14 mg via TRANSDERMAL
  Filled 2017-06-26 (×4): qty 1

## 2017-06-26 MED ORDER — ENOXAPARIN SODIUM 100 MG/ML ~~LOC~~ SOLN
90.0000 mg | SUBCUTANEOUS | Status: DC
  Administered 2017-06-26 – 2017-06-29 (×4): 90 mg via SUBCUTANEOUS
  Filled 2017-06-26 (×4): qty 1

## 2017-06-26 MED ORDER — BISACODYL 10 MG RE SUPP
10.0000 mg | Freq: Once | RECTAL | Status: AC
  Administered 2017-06-26: 10 mg via RECTAL
  Filled 2017-06-26: qty 1

## 2017-06-26 MED ORDER — ALBUTEROL SULFATE (2.5 MG/3ML) 0.083% IN NEBU
2.5000 mg | INHALATION_SOLUTION | Freq: Four times a day (QID) | RESPIRATORY_TRACT | Status: DC | PRN
  Administered 2017-06-26: 2.5 mg via RESPIRATORY_TRACT
  Filled 2017-06-26: qty 3

## 2017-06-26 MED ORDER — ONDANSETRON HCL 4 MG/2ML IJ SOLN: 4.0000 mg | Freq: Four times a day (QID) | INTRAMUSCULAR | Status: DC | PRN

## 2017-06-26 MED ORDER — FAMOTIDINE IN NACL 20-0.9 MG/50ML-% IV SOLN
20.0000 mg | Freq: Two times a day (BID) | INTRAVENOUS | Status: DC
  Administered 2017-06-26 – 2017-06-28 (×6): 20 mg via INTRAVENOUS
  Filled 2017-06-26 (×6): qty 50

## 2017-06-26 NOTE — Progress Notes (Signed)
Patient seen and evaluated, chart reviewed, please see EMR for updated orders. Please see full H&P dictated by admitting physician Dr Tamala Julian for same date of service.   Patient had loose liquid stools, also passing gas  06/26/17 Abd Xrays IMPRESSION: Persistent partial mid small bowel obstruction. There may have been slight interval improvement since yesterday's study.   1)Persistent partial mid small bowel obstruction. -----Patient declines NG tube placement, patient declines surgical consult at this time, patient states that these typically resolve spontaneously for him,.  He is n.p.o. but he tells me that he has every intention to drink water from the facette/sink in the room  2)Tobacco abuse- smoking cessation counseling for 4 minutes today, consider nicotine patch  3)AKI-Baseline creatinine 0.9, creatinine on admission on 06/25/2016 was 1.32, creatinine is back down to 1.1, hydrate and avoid nephrotoxic agent

## 2017-06-26 NOTE — ED Provider Notes (Addendum)
[facility]ERGENCY DEPARTMENT Provider Note   CSN: [id number] Arrival date & time: 06/25/17  1518     History   Chief Complaint Chief Complaint  Patient presents with  . Abdominal Pain  . Emesis    HPI Caylor Tallarico is a 47 y.o. male.  HPI  This is a 47 year old male with a history of gastric sleeve and bowel obstruction who presents with 3 to 4-day history of abdominal pain, vomiting.  She reports multiple episodes of nonbilious, nonbloody emesis.  He states he cannot keep anything down.  He has not had a normal bowel movement in 4 to 5 days.  He does report watery green stools.  He denies any bloody stools.  Denies fevers or chills.  Reports diffuse abdominal pain that radiates across the top and inferiorly.  He states that it is sharp.  It comes and goes.  Currently his pain is 8 out of 10.  This feels like his prior bowel obstructions.  Past Medical History:  Diagnosis Date  . Asthma   . Bronchitis   . Chronic bronchitis ([place])   . Complication of anesthesia    usually wake up in the middle of procedure or surgery  . Gallstones   . GERD (gastroesophageal reflux disease)   . Hypertension   . Obesity   . Sleep apnea    "suppose to wear mask but I don't" (05/31/2015)    Patient Active Problem List   Diagnosis Date Noted  . Erectile dysfunction 04/11/2017  . Lumbar back sprain 01/28/2017  . History of colonic polyps 12/28/2015  . Small bowel obstruction ([place]) 05/31/2015  . Hepatic steatosis 05/31/2015  . Essential hypertension 07/31/2014  . ANXIETY 11/14/2009  . DEPENDENT EDEMA 11/09/2008  . 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  . ADHD (attention deficit hyperactivity disorder) 10/21/2006  . GERD 10/21/2006  . Morbid obesity ([place]) 07/30/2006  . OSA (obstructive sleep apnea) 07/30/2006  . Hemorrhoids 10/03/2005    Past Surgical History:  Procedure Laterality Date  . CHOLECYSTECTOMY N/A 02/14/2015   Procedure: LAPAROSCOPIC  CHOLECYSTECTOMY WITH INTRAOPERATIVE CHOLANGIOGRAM;  Surgeon: Coralie Keens, MD;  Location: [facility];  Service: General;  Laterality: N/A;  . COLONOSCOPY  10/16/2005   Polyps (tubular adenoma 2007), external hemorrhoids  . COLONOSCOPY WITH PROPOFOL N/A 01/05/2016   Procedure: COLONOSCOPY WITH PROPOFOL;  Surgeon: Milus Banister, MD;  Location: [facility];  Service: Endoscopy;  Laterality: N/A;  . FINGER SURGERY Left    reconstructive - 2 surgeries on hand  . LAPAROSCOPIC GASTRIC SLEEVE RESECTION  10/2010    at [facility] Medications    Prior to Admission medications   Medication Sig Start Date End Date Taking? Authorizing Provider  albuterol (ACCUNEB) 0.63 MG/3ML nebulizer solution Take 3 mLs (0.63 mg total) by nebulization every 6 (six) hours as needed for wheezing or shortness of breath. 05/10/17  Yes Elby Beck, FNP  albuterol (PROAIR HFA) 108 (90 Base) MCG/ACT inhaler Inhale 2 puffs into the lungs every 6 (six) hours as needed for wheezing or shortness of breath. 08/28/16  Yes Copland, Frederico Hamman, MD  amLODipine (NORVASC) 5 MG tablet TAKE ONE TABLET BY MOUTH ONCE DAILY 10/05/16  Yes Lucille Passy, MD  cetirizine (ZYRTEC) 10 MG tablet Take 10 mg by mouth daily.   Yes [provider]  omeprazole (PRILOSEC) 20 MG capsule Take 1 capsule (20 mg total) by mouth daily. 04/11/17  Yes [name], NP  sildenafil (  REVATIO) 20 MG tablet Take 2-5 tablets by mouth 30 minutes prior to intercourse. 04/11/17  Yes [name], NP  benzonatate (TESSALON) 100 MG capsule Take 1-2 capsules (100-200 mg total) by mouth 3 (three) times daily as needed for cough. Patient not taking: Reported on 06/26/2017 05/10/17   Elby Beck, FNP    Family History Family History  Problem Relation Age of Onset  . Diabetes Mellitus II Mother   . Breast cancer Mother   . Ovarian cancer Mother   . Diabetes Mellitus II Father   . CAD Father   . Stroke Father   . Cancer Father        ?  .  Clotting disorder Father        blood clots in brain with surgery  . Cirrhosis Maternal Grandmother   . Breast cancer Maternal Grandmother     Social History Social History   Tobacco Use  . Smoking status: Current Every Day Smoker    Packs/day: 3.00    Years: 0.00    Pack years: 0.00    Types: Cigarettes    Start date: 10/03/2016  . Smokeless tobacco: Current User    Types: Snuff  . Tobacco comment: Quit for 9 years and just restarted recently  Substance Use Topics  . Alcohol use: Yes    Alcohol/week: 7.8 oz    Types: 13 Shots of liquor per week    Comment: "a couple of shots a day"  . Drug use: No     Allergies   Bee venom; Codeine; Hornet venom; and Penicillins   Review of Systems Review of Systems  Constitutional: Negative for fever.  Respiratory: Negative for shortness of breath.   Cardiovascular: Negative for chest pain.  Gastrointestinal: Positive for abdominal pain, diarrhea, nausea and vomiting. Negative for blood in stool.  Genitourinary: Negative for dysuria.  Musculoskeletal: Negative for back pain.  All other systems reviewed and are negative.    Physical Exam Updated Vital Signs BP (!) 145/101 (BP Location: Right Arm)   Pulse 82   Temp 98.4 F (36.9 C) (Oral)   Resp 18   Ht 6\' 3"  (1.905 m)   Wt (!) 186 kg (410 lb)   SpO2 99%   BMI 51.25 kg/m   Physical Exam  Constitutional: He is oriented to person, place, and time.  Morbidly obese, no acute distress  HENT:  Head: Normocephalic and atraumatic.  Mucous membranes dry  Neck: Neck supple.  Cardiovascular: Normal rate, regular rhythm and normal heart sounds.  No murmur heard. Pulmonary/Chest: Effort normal and breath sounds normal. No respiratory distress. He has no wheezes.  Abdominal: Soft. Bowel sounds are increased. There is generalized tenderness. There is no rebound and no guarding.  Generalized tenderness to palpation, hyperactive bowel sounds, no signs of peritonitis  Musculoskeletal:  He exhibits no edema.  Lymphadenopathy:    He has no cervical adenopathy.  Neurological: He is alert and oriented to person, place, and time.  Skin: Skin is warm and dry.  Psychiatric: He has a normal mood and affect.  Nursing note and vitals reviewed.    ED Treatments / Results  Labs (all labs ordered are listed, but only abnormal results are displayed) Labs Reviewed  COMPREHENSIVE METABOLIC PANEL - Abnormal; Notable for the following components:      Result Value   Chloride 100 (*)    Glucose, Bld 102 (*)    Creatinine, Ser 1.32 (*)    Total Protein 8.2 (*)  All other components within normal limits  CBC - Abnormal; Notable for the following components:   RBC 5.98 (*)    Hemoglobin 18.2 (*)    HCT 52.1 (*)    All other components within normal limits  LIPASE, BLOOD  URINALYSIS, ROUTINE W REFLEX MICROSCOPIC    EKG None  Radiology Ct Abdomen Pelvis W Contrast  Result Date: 06/25/2017 CLINICAL DATA:  47 y/o M; generalized abdominal pain with nausea, vomiting, and diarrhea that began yesterday. History of small bowel obstruction. EXAM: CT ABDOMEN AND PELVIS WITH CONTRAST TECHNIQUE: Multidetector CT imaging of the abdomen and pelvis was performed using the standard protocol following bolus administration of intravenous contrast. CONTRAST:  158mL ISOVUE-300 IOPAMIDOL (ISOVUE-300) INJECTION 61% COMPARISON:  12/13/2015 and 07/15/2014 CT abdomen and pelvis. FINDINGS: Lower chest: No acute abnormality. Hepatobiliary: Hepatic steatosis. No focal liver abnormality is seen. Status post cholecystectomy. No biliary dilatation. Pancreas: Unremarkable. No pancreatic ductal dilatation or surrounding inflammatory changes. Spleen: Normal in size without focal abnormality. Adrenals/Urinary Tract: Adrenal glands are unremarkable. Bilateral renal cysts measuring up to 8.7 cm in right interpolar kidney, mildly increased in size. Otherwise kidneys are normal, without renal calculi, focal lesion, or  hydronephrosis. Bladder is unremarkable. Stomach/Bowel: Sleeve gastrectomy. Mild dilatation of proximal loops of small bowel with decompressed distal loops of small bowel compatible obstruction. No bowel wall thickening or findings of perforation. Mild mesenteric stranding and prominent lymph nodes. Colon is unremarkable. Vascular/Lymphatic: No significant vascular findings are present. No enlarged abdominal or pelvic lymph nodes. Reproductive: Prostate is unremarkable. Other: No abdominal wall hernia or abnormality. No abdominopelvic ascites. Musculoskeletal: No acute or significant osseous findings. IMPRESSION: 1. Mild diffuse dilatation of proximal small bowel with decompressed distal small bowel compatible with small bowel obstruction, likely partial. No findings of perforation or abscess. 2. Persistent misty mesentery with prominent lymph nodes may representing reactive inflammation. Sclerosing mesenteritis, lymphoproliferative disorder, or third-spacing could have this appearance. 3. Hepatic steatosis. Electronically Signed   By: Kristine Garbe M.D.   On: 06/25/2017 19:32    Procedures Procedures (including critical care time)  Medications Ordered in ED Medications  iopamidol (ISOVUE-300) 61 % injection (has no administration in time range)  sodium chloride 0.9 % bolus 1,000 mL (has no administration in time range)  morphine 4 MG/ML injection 4 mg (has no administration in time range)  ondansetron (ZOFRAN) injection 4 mg (has no administration in time range)  ondansetron (ZOFRAN-ODT) disintegrating tablet 4 mg (4 mg Oral Given 06/25/17 1618)  iopamidol (ISOVUE-300) 61 % injection 100 mL (100 mLs Intravenous Contrast Given 06/25/17 1900)     Initial Impression / Assessment and Plan / ED Course  I have reviewed the triage vital signs and the nursing notes.  Pertinent labs & imaging results that were available during my care of the patient were reviewed by me and considered in my  medical decision making (see chart for details).     Patient presents with abdominal pain, vomiting, no watery stools.  He is nontoxic-appearing on exam.  Vital signs are reassuring.  He has abdominal tenderness but no signs of peritonitis.  Bowel sounds increased.  CT scan reviewed from provider in triage and is notable for likely a partial small bowel obstruction.  Patient states that he cannot tolerate an NG tube.  He has been admitted for bowel obstructions in the past.  Lab work notable for creatinine of 1.32 and a hemoglobin of 18.  Likely represents dehydration and hemoconcentration.  Patient was given fluids, pain medication,  nausea medication.  Will admit for hydration and symptom control until resolved.  Will defer surgery consultation until the morning unless requested by the hospitalist.  Final Clinical Impressions(s) / ED Diagnoses   Final diagnoses:  SBO (small bowel obstruction) [facility])  Dehydration    ED Discharge Orders    None       Jurrell Royster, Barbette Hair, MD 06/26/17 2841    [name], MD 06/26/17 [phone]

## 2017-06-26 NOTE — Progress Notes (Signed)
Pt is requesting a nicotine patch. MD paged.

## 2017-06-26 NOTE — H&P (Signed)
History and Physical    [name] [name] LOV:[id number] DOB: 09-08-1970 DOA: 06/25/2017  Referring MD/NP/PA: Dr. Thayer Jew PCP: [name], NP  Patient coming from: Home  Chief Complaint: Abdominal pain  I have personally briefly reviewed patient's old medical records in [place]   HPI: [name] [name] is a 47 y.o. male with medical history significant of HTN, morbid obesity s/p gastric sleeve, chronic bronchitis, SBO, and GERD; who presents with complaints of abdominal pain with nausea and vomiting over the last 3-4 days.  He complains of sharp abdominal pain that is epigastric and radiates down.  The patient has had multiple episodes of non bloody and non-bilious emesis unable to keep any significant amount of food or liquids down.  Notes that his last bowel movement was approximately 5 days ago.  Denies any significant fever, chills, or chest pain.  He has had 2-3 similar episodes in the past which prompted him to come to the emergency department.  ED Course: Upon admission into the emergency department patient was noted to be Afebrile with O2 saturations noted as low as 86% following IV pain medication. Labs revealed WBC 5.2 and hemoglobin 18.2, BUN 8, and creatinine 1.32.  CT scan showed mild diffuse dilation of the proximal small bowel compatible with small bowel obstruction.  Given 1 L normal saline IV fluids, morphine, and Zofran.  Patient refused NGT.  TRH called to admit.   Review of Systems  Constitutional: Positive for malaise/fatigue. Negative for chills and fever.  HENT: Negative for ear discharge and nosebleeds.   Eyes: Negative for photophobia and discharge.  Respiratory: Positive for shortness of breath and wheezing. Negative for cough.   Cardiovascular: Negative for chest pain and leg swelling.  Gastrointestinal: Positive for abdominal pain, nausea and vomiting.  Genitourinary: Negative for dysuria and hematuria.  Musculoskeletal: Negative for falls  and neck pain.  Skin: Negative for itching and rash.  Neurological: Negative for focal weakness and loss of consciousness.  Psychiatric/Behavioral: Negative for substance abuse. The patient is not nervous/anxious.     Past Medical History:  Diagnosis Date  . Asthma   . Bronchitis   . Chronic bronchitis ([place])   . Complication of anesthesia    usually wake up in the middle of procedure or surgery  . Gallstones   . GERD (gastroesophageal reflux disease)   . Hypertension   . Obesity   . Sleep apnea    "suppose to wear mask but I don't" (05/31/2015)    Past Surgical History:  Procedure Laterality Date  . CHOLECYSTECTOMY N/A 02/14/2015   Procedure: LAPAROSCOPIC CHOLECYSTECTOMY WITH INTRAOPERATIVE CHOLANGIOGRAM;  Surgeon: Coralie Keens, MD;  Location: [facility];  Service: General;  Laterality: N/A;  . COLONOSCOPY  10/16/2005   Polyps (tubular adenoma 2007), external hemorrhoids  . COLONOSCOPY WITH PROPOFOL N/A 01/05/2016   Procedure: COLONOSCOPY WITH PROPOFOL;  Surgeon: Milus Banister, MD;  Location: [facility];  Service: Endoscopy;  Laterality: N/A;  . FINGER SURGERY Left    reconstructive - 2 surgeries on hand  . LAPAROSCOPIC GASTRIC SLEEVE RESECTION  10/2010    at [facility]     reports that he has been smoking cigarettes.  He started smoking about 8 months ago. He has been smoking about 3.00 packs per day for the past 0.00 years. His smokeless tobacco use includes snuff. He reports that he drinks about 7.8 oz of alcohol per week. He reports that he does not use drugs.  Allergies  Allergen Reactions  . Bee Venom  Anaphylaxis  . Codeine Anaphylaxis    Tolerates dilaudid  . Hornet Venom Anaphylaxis  . Penicillins Hives and Other (See Comments)    Has patient had a PCN reaction causing immediate rash, facial/tongue/throat swelling, SOB or lightheadedness with hypotensionYES Has patient had a PCN reaction causing severe rash involving mucus membranes or skin necrosis: NO Has patient had  a PCN reaction that required hospitalization NO Has patient had a PCN reaction occurring within the last 10 years: NO If all of the above answers are "NO", then may proceed with Cephalosporin use.    Family History  Problem Relation Age of Onset  . Diabetes Mellitus II Mother   . Breast cancer Mother   . Ovarian cancer Mother   . Diabetes Mellitus II Father   . CAD Father   . Stroke Father   . Cancer Father        ?  . Clotting disorder Father        blood clots in brain with surgery  . Cirrhosis Maternal Grandmother   . Breast cancer Maternal Grandmother     Prior to Admission medications   Medication Sig Start Date End Date Taking? Authorizing Provider  albuterol (ACCUNEB) 0.63 MG/3ML nebulizer solution Take 3 mLs (0.63 mg total) by nebulization every 6 (six) hours as needed for wheezing or shortness of breath. 05/10/17  Yes Elby Beck, FNP  albuterol (PROAIR HFA) 108 (90 Base) MCG/ACT inhaler Inhale 2 puffs into the lungs every 6 (six) hours as needed for wheezing or shortness of breath. 08/28/16  Yes Copland, Frederico Hamman, MD  amLODipine (NORVASC) 5 MG tablet TAKE ONE TABLET BY MOUTH ONCE DAILY 10/05/16  Yes Lucille Passy, MD  cetirizine (ZYRTEC) 10 MG tablet Take 10 mg by mouth daily.   Yes [provider]  omeprazole (PRILOSEC) 20 MG capsule Take 1 capsule (20 mg total) by mouth daily. 04/11/17  Yes [name], NP  sildenafil (REVATIO) 20 MG tablet Take 2-5 tablets by mouth 30 minutes prior to intercourse. 04/11/17  Yes [name], NP  benzonatate (TESSALON) 100 MG capsule Take 1-2 capsules (100-200 mg total) by mouth 3 (three) times daily as needed for cough. Patient not taking: Reported on 06/26/2017 05/10/17   Elby Beck, FNP    Physical Exam:  Constitutional: Obese male in NAD, calm, comfortable Vitals:   06/25/17 1753 06/25/17 2010 06/25/17 2245 06/26/17 0101  BP: (!) 140/99 (!) 135/93 (!) 145/101 133/90  Pulse: 81 86 82 73  Resp: 14 16 18      Temp: 98.8 F (37.1 C) 98.4 F (36.9 C)    TempSrc: Oral Oral    SpO2: 98% 97% 99% (!) 86%  Weight:      Height:       Eyes: PERRL, lids and conjunctivae normal ENMT: Mucous membranes are dry. Posterior pharynx clear of any exudate or lesions.  Neck: normal, supple, no masses, no thyromegaly Respiratory: Normal respiratory effort with some mild wheezes noted on physical exam.  Patient able to talk in complete sentences. Cardiovascular: Regular rate and rhythm, no murmurs / rubs / gallops. No extremity edema. 2+ pedal pulses. No carotid bruits.  Abdomen: Decreased overall bowel sounds with generalized tenderness palpation. Musculoskeletal: no clubbing / cyanosis. No joint deformity upper and lower extremities. Good ROM, no contractures. Normal muscle tone.  Skin: no rashes, lesions, ulcers. No induration Neurologic: CN 2-12 grossly intact. Sensation intact, DTR normal. Strength 5/5 in all 4.  Psychiatric: Normal judgment and insight. Alert  and oriented x 3. Normal mood.     Labs on Admission: I have personally reviewed following labs and imaging studies  CBC: Recent Labs  Lab 06/25/17 1630  WBC 5.2  HGB 18.2*  HCT 52.1*  MCV 87.1  PLT 202   Basic Metabolic Panel: Recent Labs  Lab 06/25/17 1630  NA 137  K 3.9  CL 100*  CO2 24  GLUCOSE 102*  BUN 8  CREATININE 1.32*  CALCIUM 9.5   GFR: Estimated Creatinine Clearance: 125 mL/min (A) (by C-G formula based on SCr of 1.32 mg/dL (H)). Liver Function Tests: Recent Labs  Lab 06/25/17 1630  AST 26  ALT 29  ALKPHOS 67  BILITOT 1.1  PROT 8.2*  ALBUMIN 4.3   Recent Labs  Lab 06/25/17 1630  LIPASE 24   No results for input(s): AMMONIA in the last 168 hours. Coagulation Profile: No results for input(s): INR, PROTIME in the last 168 hours. Cardiac Enzymes: No results for input(s): CKTOTAL, CKMB, CKMBINDEX, TROPONINI in the last 168 hours. BNP (last 3 results) No results for input(s): PROBNP in the last 8760  hours. HbA1C: No results for input(s): HGBA1C in the last 72 hours. CBG: No results for input(s): GLUCAP in the last 168 hours. Lipid Profile: No results for input(s): CHOL, HDL, LDLCALC, TRIG, CHOLHDL, LDLDIRECT in the last 72 hours. Thyroid Function Tests: No results for input(s): TSH, T4TOTAL, FREET4, T3FREE, THYROIDAB in the last 72 hours. Anemia Panel: No results for input(s): VITAMINB12, FOLATE, FERRITIN, TIBC, IRON, RETICCTPCT in the last 72 hours. Urine analysis:    Component Value Date/Time   COLORURINE YELLOW 01/19/2017 [place] 01/19/2017 1649   LABSPEC 1.016 01/19/2017 1649   PHURINE 7.0 01/19/2017 1649   GLUCOSEU NEGATIVE 01/19/2017 1649   HGBUR NEGATIVE 01/19/2017 1649   BILIRUBINUR NEGATIVE 01/19/2017 1649   BILIRUBINUR Neg 11/06/2016 1018   KETONESUR NEGATIVE 01/19/2017 1649   PROTEINUR NEGATIVE 01/19/2017 1649   UROBILINOGEN 0.2 11/06/2016 1018   UROBILINOGEN 1.0 05/18/2008 1235   NITRITE NEGATIVE 01/19/2017 1649   LEUKOCYTESUR NEGATIVE 01/19/2017 1649   Sepsis Labs: No results found for this or any previous visit (from the past 240 hour(s)).   Radiological Exams on Admission: Ct Abdomen Pelvis W Contrast  Result Date: 06/25/2017 CLINICAL DATA:  47 y/o M; generalized abdominal pain with nausea, vomiting, and diarrhea that began yesterday. History of small bowel obstruction. EXAM: CT ABDOMEN AND PELVIS WITH CONTRAST TECHNIQUE: Multidetector CT imaging of the abdomen and pelvis was performed using the standard protocol following bolus administration of intravenous contrast. CONTRAST:  175mL ISOVUE-300 IOPAMIDOL (ISOVUE-300) INJECTION 61% COMPARISON:  12/13/2015 and 07/15/2014 CT abdomen and pelvis. FINDINGS: Lower chest: No acute abnormality. Hepatobiliary: Hepatic steatosis. No focal liver abnormality is seen. Status post cholecystectomy. No biliary dilatation. Pancreas: Unremarkable. No pancreatic ductal dilatation or surrounding inflammatory  changes. Spleen: Normal in size without focal abnormality. Adrenals/Urinary Tract: Adrenal glands are unremarkable. Bilateral renal cysts measuring up to 8.7 cm in right interpolar kidney, mildly increased in size. Otherwise kidneys are normal, without renal calculi, focal lesion, or hydronephrosis. Bladder is unremarkable. Stomach/Bowel: Sleeve gastrectomy. Mild dilatation of proximal loops of small bowel with decompressed distal loops of small bowel compatible obstruction. No bowel wall thickening or findings of perforation. Mild mesenteric stranding and prominent lymph nodes. Colon is unremarkable. Vascular/Lymphatic: No significant vascular findings are present. No enlarged abdominal or pelvic lymph nodes. Reproductive: Prostate is unremarkable. Other: No abdominal wall hernia or abnormality. No abdominopelvic ascites. Musculoskeletal: No  acute or significant osseous findings. IMPRESSION: 1. Mild diffuse dilatation of proximal small bowel with decompressed distal small bowel compatible with small bowel obstruction, likely partial. No findings of perforation or abscess. 2. Persistent misty mesentery with prominent lymph nodes may representing reactive inflammation. Sclerosing mesenteritis, lymphoproliferative disorder, or third-spacing could have this appearance. 3. Hepatic steatosis. Electronically Signed   By: Kristine Garbe M.D.   On: 06/25/2017 19:32    CT abdomen pelvis: Independently reviewed.  Diffuse dilation of the small bowel suggestive of small bowel obstruction.  Assessment/Plan Small bowel obstruction: Acute.  Patient with abdominal pain for last 3-4 days with nausea and vomiting.  Risk factors include history of abdominal surgeries include gastric sleeve, cholecystectomy, and previous history of small bowel obstruction.  Patient refused NGT. - Admit to MedSurg - n.p.o  - IV fluids normal saline at 100 mL/h - Antiemetics as needed - IV pain medication as needed - Will need to  consult general surgery in a.m.  Dehydration: Acute.  Hemoglobin normally within normal limits, but presents with hemoglobin of 18.2 g/dL.  Suspect hemoconcentration secondary to dehydration with nausea and vomiting symptoms. - IV fluids as seen above - Recheck CBC in a.m.  Acute kidney injury: Baseline creatinine previously noted to be limits.  Initial creatinine 1.32 with BUN 8.  Suspect prerenal although BUN to creatinine ratio does not correlate. - IV fluids as tolerated - Recheck BMP in a.m.  Chronic bronchitis - Albuterol nebs as needed  GERD - Pharmacy substitution of Pepcid IV  Morbid obesity: BMI noted to be 51.25  DVT prophylaxis: lovenox  Code Status: Full  Family Communication: No family present at bedside Disposition Plan: Likely discharge home once medically stable Consults called: none  Admission status: Observation  Norval Morton MD [facility] Pager [phone]   If 7PM-7AM, please contact night-coverage [email or web address] Password [email or web address]  06/26/2017, 1:16 AM

## 2017-06-27 ENCOUNTER — Inpatient Hospital Stay (HOSPITAL_COMMUNITY): Payer: BC Managed Care – PPO

## 2017-06-27 LAB — GLUCOSE, CAPILLARY
GLUCOSE-CAPILLARY: 87 mg/dL (ref 65–99)
Glucose-Capillary: 85 mg/dL (ref 65–99)
Glucose-Capillary: 87 mg/dL (ref 65–99)
Glucose-Capillary: 90 mg/dL (ref 65–99)

## 2017-06-27 MED ORDER — MINERAL OIL RE ENEM
1.0000 | ENEMA | Freq: Once | RECTAL | Status: DC | PRN
  Filled 2017-06-27: qty 1

## 2017-06-27 MED ORDER — BISACODYL 10 MG RE SUPP
10.0000 mg | Freq: Once | RECTAL | Status: AC
  Administered 2017-06-27: 10 mg via RECTAL
  Filled 2017-06-27: qty 1

## 2017-06-27 MED ORDER — DIATRIZOATE MEGLUMINE & SODIUM 66-10 % PO SOLN
90.0000 mL | Freq: Once | ORAL | Status: AC
  Administered 2017-06-27: 90 mL via NASOGASTRIC
  Filled 2017-06-27: qty 90

## 2017-06-27 MED ORDER — HYDRALAZINE HCL 20 MG/ML IJ SOLN
10.0000 mg | Freq: Four times a day (QID) | INTRAMUSCULAR | Status: DC | PRN
  Administered 2017-06-27: 10 mg via INTRAVENOUS
  Filled 2017-06-27: qty 1

## 2017-06-27 NOTE — Progress Notes (Addendum)
Pt is very agitated and he wanted to to take his BP  pill not the ordered hydralazine iv .He drunk water and he wanted ice chips otherwise he will be leaving AMA.This nurse explained that he has SBO and need to remain NPO.

## 2017-06-27 NOTE — Progress Notes (Signed)
Gave patient gastrografin that was ordered.  Patient took one sip and refuses to drink anymore.  Made Margie Billet, PA-C aware.

## 2017-06-27 NOTE — Progress Notes (Signed)
Patient Demographics:    [name] [name], is a 47 y.o. male, DOB - 12/31/1970, HBZ:[id number]  Admit date - 06/25/2017   Admitting Physician Norval Morton, MD  Outpatient Primary MD for the patient is [name] [name], NP  LOS - 1   Chief Complaint  Patient presents with  . Abdominal Pain  . Emesis        Subjective:    [name] [name] today has no fevers, no emesis,  No chest pain, requesting to be allowed to eat, refusing to cooperate with small bowel follow-through ordered by surgical team  Assessment  & Plan :    Principal Problem:   SBO (small bowel obstruction) ([place]) Active Problems:   Essential hypertension   Small bowel obstruction ([facility])   Chronic bronchitis ([place])   Dehydration   AKI (acute kidney injury) ([name] [name])  1)Persistent partial  small bowel obstruction. -----No emesis, abdominal pain apparently is better, follow-up abdominal x-ray shows persistent SBO patient declines NG tube placement, surgical consult appreciated, refusing to cooperate with small bowel follow-through ordered by surgical team.  He is n.p.o. but he tells me that he has every intention to drink water from the facette/sink in the room  2)Tobacco abuse- smoking cessation counseling for 4 minutes today, consider nicotine patch  3)AKI-Baseline creatinine 0.9, creatinine on admission on 06/25/2016 was 1.32, creatinine is back down to 1.1, hydrate and avoid nephrotoxic agent  4)HTN- stable,   may use IV Hydralazine 10 mg  Every 4 hours Prn for systolic blood pressure over 160 mmhg   Code Status : full   Disposition Plan  : home  Consults  :  gen surg   DVT Prophylaxis  :  Lovenox    Lab Results  Component Value Date   PLT 153 06/26/2017    Inpatient Medications  Scheduled Meds: . enoxaparin (LOVENOX) injection  90 mg Subcutaneous Q24H  . nicotine  14 mg Transdermal Daily   Continuous  Infusions: . dextrose 5 % and 0.45% NaCl 125 mL/hr at 06/27/17 0907  . famotidine (PEPCID) IV Stopped (06/27/17 0937)   PRN Meds:.albuterol, hydrALAZINE, mineral oil, morphine injection, ondansetron (ZOFRAN) IV    Anti-infectives (From admission, onward)   None        Objective:   Vitals:   06/26/17 1218 06/26/17 2115 06/27/17 0515 06/27/17 1510  BP: (!) 122/92 (!) 142/90 131/85 (!) 134/99  Pulse: 68 67 64 60  Resp: 18 18 16 20   Temp: 97.9 F (36.6 C) 97.6 F (36.4 C) 97.7 F (36.5 C) (!) 97.5 F (36.4 C)  TempSrc: Oral Oral Oral Oral  SpO2: 98% 98% 98% 98%  Weight:      Height:        Wt Readings from Last 3 Encounters:  06/26/17 (!) 177 kg (390 lb 3.4 oz)  05/10/17 (!) 186 kg (410 lb)  04/11/17 (!) 186.3 kg (410 lb 12 oz)     Intake/Output Summary (Last 24 hours) at 06/27/2017 1851 Last data filed at 06/27/2017 1428 Gross per 24 hour  Intake 2702.08 ml  Output 300 ml  Net 2402.08 ml     Physical Exam  Gen:- Awake Alert,  In no apparent distress , obese HEENT:- [place].AT, No sclera icterus Neck-Supple Neck,No JVD,.  Lungs-  CTAB , good air movement CV- S1, S2 normal Abd-  +ve B.Sounds, Abd Soft, mild discomfort on palpation, denies any significant tenderness, increased truncal adiposity noted Extremity/Skin:- No  edema,   good pulses  psych-affect is appropriate, oriented x3 Neuro-no new focal deficits, no tremors   Data Review:   Micro Results No results found for this or any previous visit (from the past 240 hour(s)).  Radiology Reports Ct Abdomen Pelvis W Contrast  Result Date: 06/25/2017 CLINICAL DATA:  47 y/o M; generalized abdominal pain with nausea, vomiting, and diarrhea that began yesterday. History of small bowel obstruction. EXAM: CT ABDOMEN AND PELVIS WITH CONTRAST TECHNIQUE: Multidetector CT imaging of the abdomen and pelvis was performed using the standard protocol following bolus administration of intravenous contrast. CONTRAST:  147mL  ISOVUE-300 IOPAMIDOL (ISOVUE-300) INJECTION 61% COMPARISON:  12/13/2015 and 07/15/2014 CT abdomen and pelvis. FINDINGS: Lower chest: No acute abnormality. Hepatobiliary: Hepatic steatosis. No focal liver abnormality is seen. Status post cholecystectomy. No biliary dilatation. Pancreas: Unremarkable. No pancreatic ductal dilatation or surrounding inflammatory changes. Spleen: Normal in size without focal abnormality. Adrenals/Urinary Tract: Adrenal glands are unremarkable. Bilateral renal cysts measuring up to 8.7 cm in right interpolar kidney, mildly increased in size. Otherwise kidneys are normal, without renal calculi, focal lesion, or hydronephrosis. Bladder is unremarkable. Stomach/Bowel: Sleeve gastrectomy. Mild dilatation of proximal loops of small bowel with decompressed distal loops of small bowel compatible obstruction. No bowel wall thickening or findings of perforation. Mild mesenteric stranding and prominent lymph nodes. Colon is unremarkable. Vascular/Lymphatic: No significant vascular findings are present. No enlarged abdominal or pelvic lymph nodes. Reproductive: Prostate is unremarkable. Other: No abdominal wall hernia or abnormality. No abdominopelvic ascites. Musculoskeletal: No acute or significant osseous findings. IMPRESSION: 1. Mild diffuse dilatation of proximal small bowel with decompressed distal small bowel compatible with small bowel obstruction, likely partial. No findings of perforation or abscess. 2. Persistent misty mesentery with prominent lymph nodes may representing reactive inflammation. Sclerosing mesenteritis, lymphoproliferative disorder, or third-spacing could have this appearance. 3. Hepatic steatosis. Electronically Signed   By: Kristine Garbe M.D.   On: 06/25/2017 19:32   Dg Abd 2 Views  Result Date: 06/27/2017 CLINICAL DATA:  Abdominal distention. Constipation. Small bowel obstruction. EXAM: ABDOMEN - 2 VIEW COMPARISON:  06/26/2017. FINDINGS: Distended loops  of small bowel are noted. Colonic gas pattern is unremarkable. These findings may be related adynamic ileus however to evaluate for developing small bowel obstruction follow-up abdominal series suggested. No free air. Surgical clips right upper quadrant. Degenerative changes lumbar spine. IMPRESSION: Several dilated loops of air-filled small bowel are noted. Colonic gas pattern is unremarkable. These findings may be related to adynamic ileus however to evaluate for developing small bowel obstruction follow-up abdominal series suggested. No free air. Electronically Signed   By: Marcello Moores  Register   On: 06/27/2017 11:59   Dg Abd Acute W/chest  Result Date: 06/26/2017 CLINICAL DATA:  Abdominal pain, small bowel obstruction. EXAM: DG ABDOMEN ACUTE W/ 1V CHEST COMPARISON:  Abdominal and pelvic CT scan of June 25, 2017. Chest x-ray dated October 19, 2015. FINDINGS: The lungs are adequately inflated. The interstitial markings are coarse though stable. There is no alveolar infiltrate or pleural effusion. The heart and pulmonary vascularity are normal. The mediastinum is normal in width. Within the abdomen there are small amounts of distended gas-filled small bowel observed. There is gas in the colon and rectum today. There surgical clips in the gallbladder fossa. There are no abnormal soft tissue calcifications. No  free extraluminal gas collections are observed. IMPRESSION: Persistent partial mid small bowel obstruction. There may have been slight interval improvement since yesterday's study. Mild chronic bronchitic changes, stable. Electronically Signed   By: [name]  [place] M.D.   On: 06/26/2017 13:27     CBC Recent Labs  Lab 06/25/17 1630 06/26/17 0330  WBC 5.2 6.2  HGB 18.2* 16.3  HCT 52.1* 46.2  PLT 203 153  MCV 87.1 86.2  MCH 30.4 30.4  MCHC 34.9 35.3  RDW 12.9 12.8    Chemistries  Recent Labs  Lab 06/25/17 1630 06/26/17 0330  NA 137 135  K 3.9 3.7  CL 100* 102  CO2 24 20*  GLUCOSE 102* 87    BUN 8 12  CREATININE 1.32* 1.10  CALCIUM 9.5 8.5*  AST 26  --   ALT 29  --   ALKPHOS 67  --   BILITOT 1.1  --    ------------------------------------------------------------------------------------------------------------------ No results for input(s): CHOL, HDL, LDLCALC, TRIG, CHOLHDL, LDLDIRECT in the last 72 hours.  Lab Results  Component Value Date   HGBA1C 5.0 12/21/2015   ------------------------------------------------------------------------------------------------------------------ No results for input(s): TSH, T4TOTAL, T3FREE, THYROIDAB in the last 72 hours.  Invalid input(s): FREET3 ------------------------------------------------------------------------------------------------------------------ No results for input(s): VITAMINB12, FOLATE, FERRITIN, TIBC, IRON, RETICCTPCT in the last 72 hours.  Coagulation profile No results for input(s): INR, PROTIME in the last 168 hours.  No results for input(s): DDIMER in the last 72 hours.  Cardiac Enzymes No results for input(s): CKMB, TROPONINI, MYOGLOBIN in the last 168 hours.  Invalid input(s): CK ------------------------------------------------------------------------------------------------------------------ No results found for: BNP   Roxan Hockey M.D on 06/27/2017 at 6:51 PM  Between 7am to 7pm - Pager - [phone]  After 7pm go to [email or web address] - password [email or web address]  [facility] -  Office  [phone]   Voice Recognition Viviann Spare dictation system was used to create this note, attempts have been made to correct errors. Please contact the author with questions and/or clarifications.

## 2017-06-27 NOTE — Consult Note (Signed)
[facility] Surgery Consult Note  [name] [name] Dec 17, 1970  [id number].    Requesting MD: Roxan Hockey Chief Complaint/Reason for Consult: SBO  HPI:  [name] [name] is a 47yo male with prior h/o gastric sleeve and laparoscopic cholecystectomy, who was admitted to [facility] 4/23 with a SBO. Patient states that he started having abdominal pain nearly 1 week ago. The pain was global and progressively got worse. He had multiple episodes of nausea and vomiting. Denies fever or chills. Last good BM was over 1 week ago. Hospital workup included CT scan which showed mild diffuse dilatation of proximal small bowel with decompressed distal small bowel compatible with small bowel obstruction. He was admitted for bowel rest. Patient has refused NG tube multiple times. States that he is passing flatus and had a tiny mucousy BM today. Xray today showed dilated loops of small bowel concerning for adynamic ileus versus SBO.  Patient states that he has had multiple SBOs in the past that he treated with bowel rest home home, mostly recently about 1 year ago.  -PMH significant for HTN, Obesity -Abdominal surgical history: laparoscopic gastric sleeve ~2012 in [place], lap chole 02/2015 Dr. 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   HENT: Negative.   Eyes: Negative.   Respiratory: Negative.   Cardiovascular: Negative.   Gastrointestinal: Positive for abdominal pain, constipation, nausea and vomiting. Negative for diarrhea.  Genitourinary: Negative.   Musculoskeletal: Negative.   Skin: Negative.   Neurological: Negative.    All systems reviewed and otherwise negative except for as above  Family History  Problem Relation Age of Onset  . Diabetes Mellitus II Mother   . Breast cancer Mother   . Ovarian cancer Mother   . Diabetes Mellitus II Father   . CAD Father   . Stroke Father   . Cancer Father        ?  . Clotting disorder  Father        blood clots in brain with surgery  . Cirrhosis Maternal Grandmother   . Breast cancer Maternal Grandmother     Past Medical History:  Diagnosis Date  . Asthma   . Bronchitis   . Chronic bronchitis ([place])   . Complication of anesthesia    usually wake up in the middle of procedure or surgery  . Gallstones   . GERD (gastroesophageal reflux disease)   . Hypertension   . Obesity   . Sleep apnea    "suppose to wear mask but I don't" (05/31/2015)    Past Surgical History:  Procedure Laterality Date  . CHOLECYSTECTOMY N/A 02/14/2015   Procedure: LAPAROSCOPIC CHOLECYSTECTOMY WITH INTRAOPERATIVE CHOLANGIOGRAM;  Surgeon: Coralie Keens, MD;  Location: [facility];  Service: General;  Laterality: N/A;  . COLONOSCOPY  10/16/2005   Polyps (tubular adenoma 2007), external hemorrhoids  . COLONOSCOPY WITH PROPOFOL N/A 01/05/2016   Procedure: COLONOSCOPY WITH PROPOFOL;  Surgeon: Milus Banister, MD;  Location: [facility];  Service: Endoscopy;  Laterality: N/A;  . FINGER SURGERY Left    reconstructive - 2 surgeries on hand  . LAPAROSCOPIC GASTRIC SLEEVE RESECTION  10/2010    at [facility] History:  reports that he has been smoking cigarettes.  He started smoking about 8 months ago. He has a 20.00 pack-year smoking history. His smokeless tobacco use includes snuff. He reports that he drinks about 7.8 oz of alcohol per week. He reports that he does not use drugs.  Allergies:  Allergies  Allergen Reactions  . Bee Venom Anaphylaxis  . Codeine Anaphylaxis    Tolerates dilaudid  . Hornet Venom Anaphylaxis  . Penicillins Hives and Other (See Comments)    Has patient had a PCN reaction causing immediate rash, facial/tongue/throat swelling, SOB or lightheadedness with hypotensionYES Has patient had a PCN reaction causing severe rash involving mucus membranes or skin necrosis: NO Has patient had a PCN reaction that required hospitalization NO Has patient had a PCN reaction occurring  within the last 10 years: NO If all of the above answers are "NO", then may proceed with Cephalosporin use.    Medications Prior to Admission  Medication Sig Dispense Refill  . albuterol (ACCUNEB) 0.63 MG/3ML nebulizer solution Take 3 mLs (0.63 mg total) by nebulization every 6 (six) hours as needed for wheezing or shortness of breath. 75 mL 3  . albuterol (PROAIR HFA) 108 (90 Base) MCG/ACT inhaler Inhale 2 puffs into the lungs every 6 (six) hours as needed for wheezing or shortness of breath. 1 Inhaler 3  . amLODipine (NORVASC) 5 MG tablet TAKE ONE TABLET BY MOUTH ONCE DAILY 90 tablet 2  . cetirizine (ZYRTEC) 10 MG tablet Take 10 mg by mouth daily.    Marland Kitchen omeprazole (PRILOSEC) 20 MG capsule Take 1 capsule (20 mg total) by mouth daily. 90 capsule 1  . sildenafil (REVATIO) 20 MG tablet Take 2-5 tablets by mouth 30 minutes prior to intercourse. 50 tablet 0  . benzonatate (TESSALON) 100 MG capsule Take 1-2 capsules (100-200 mg total) by mouth 3 (three) times daily as needed for cough. (Patient not taking: Reported on 06/26/2017) 20 capsule 0    Prior to Admission medications   Medication Sig Start Date End Date Taking? Authorizing Provider  albuterol (ACCUNEB) 0.63 MG/3ML nebulizer solution Take 3 mLs (0.63 mg total) by nebulization every 6 (six) hours as needed for wheezing or shortness of breath. 05/10/17  Yes Elby Beck, FNP  albuterol (PROAIR HFA) 108 (90 Base) MCG/ACT inhaler Inhale 2 puffs into the lungs every 6 (six) hours as needed for wheezing or shortness of breath. 08/28/16  Yes Copland, Frederico Hamman, MD  amLODipine (NORVASC) 5 MG tablet TAKE ONE TABLET BY MOUTH ONCE DAILY 10/05/16  Yes Lucille Passy, MD  cetirizine (ZYRTEC) 10 MG tablet Take 10 mg by mouth daily.   Yes [provider]  omeprazole (PRILOSEC) 20 MG capsule Take 1 capsule (20 mg total) by mouth daily. 04/11/17  Yes [name], NP  sildenafil (REVATIO) 20 MG tablet Take 2-5 tablets by mouth 30 minutes prior to  intercourse. 04/11/17  Yes [name], NP  benzonatate (TESSALON) 100 MG capsule Take 1-2 capsules (100-200 mg total) by mouth 3 (three) times daily as needed for cough. Patient not taking: Reported on 06/26/2017 05/10/17   Elby Beck, FNP    Blood pressure 131/85, pulse 64, temperature 97.7 F (36.5 C), temperature source Oral, resp. rate 16, height 6' 2"  (1.88 m), weight (!) 390 lb 3.4 oz (177 kg), SpO2 98 %. Physical Exam: General: WD/WN white male who is laying in bed in NAD HEENT: head is normocephalic, atraumatic.  Sclera are noninjected.  Pupils equal and round.  Ears and nose without any masses or lesions.  Mouth is pink and moist. Dentition fair Heart: regular, rate, and rhythm.  No obvious murmurs, gallops, or rubs noted.  Palpable pedal pulses bilaterally Lungs: CTAB, no wheezes, rhonchi, or rales noted.  Respiratory effort nonlabored Abd: obese, soft, mild distension, TTP  right hemiabdomen without rebound or guarding, hypoactive +BS, no masses, hernias, or organomegaly MS: all 4 extremities are symmetrical with no cyanosis, clubbing, or edema. Skin: warm and dry with no masses, lesions, or rashes Psych: A&Ox3 with an appropriate affect. Neuro: cranial nerves grossly intact, extremity CSM intact bilaterally, normal speech  Results for orders placed or performed during the hospital encounter of 06/25/17 (from the past 48 hour(s))  Urinalysis, Routine w reflex microscopic     Status: Abnormal   Collection Time: 06/25/17  4:14 PM  Result Value Ref Range   Color, Urine AMBER (A) YELLOW    Comment: BIOCHEMICALS MAY BE AFFECTED BY COLOR   APPearance CLEAR CLEAR   Specific Gravity, Urine 1.031 (H) 1.005 - 1.030   pH 5.0 5.0 - 8.0   Glucose, UA NEGATIVE NEGATIVE mg/dL   Hgb urine dipstick NEGATIVE NEGATIVE   Bilirubin Urine NEGATIVE NEGATIVE   Ketones, ur 5 (A) NEGATIVE mg/dL   Protein, ur NEGATIVE NEGATIVE mg/dL   Nitrite NEGATIVE NEGATIVE   Leukocytes, UA NEGATIVE  NEGATIVE    Comment: Performed at [facility], [address], [place], [place] [address]  Lipase, blood     Status: None   Collection Time: 06/25/17  4:30 PM  Result Value Ref Range   Lipase 24 11 - 51 U/L    Comment: Performed at [facility] [address] [place], [place] [address]  Comprehensive metabolic panel     Status: Abnormal   Collection Time: 06/25/17  4:30 PM  Result Value Ref Range   Sodium 137 135 - 145 mmol/L   Potassium 3.9 3.5 - 5.1 mmol/L   Chloride 100 (L) 101 - 111 mmol/L   CO2 24 22 - 32 mmol/L   Glucose, Bld 102 (H) 65 - 99 mg/dL   BUN 8 6 - 20 mg/dL   Creatinine, Ser 1.32 (H) 0.61 - 1.24 mg/dL   Calcium 9.5 8.9 - 10.3 mg/dL   Total Protein 8.2 (H) 6.5 - 8.1 g/dL   Albumin 4.3 3.5 - 5.0 g/dL   AST 26 15 - 41 U/L   ALT 29 17 - 63 U/L   Alkaline Phosphatase 67 38 - 126 U/L   Total Bilirubin 1.1 0.3 - 1.2 mg/dL   GFR calc non Af Amer >60 >60 mL/min   GFR calc Af Amer >60 >60 mL/min    Comment: (NOTE) The eGFR has been calculated using the CKD EPI equation. This calculation has not been validated in all clinical situations. eGFR's persistently <60 mL/min signify possible Chronic Kidney Disease.    Anion gap 13 5 - 15    Comment: Performed at [facility] [address] [place], [place] [address]  CBC     Status: Abnormal   Collection Time: 06/25/17  4:30 PM  Result Value Ref Range   WBC 5.2 4.0 - 10.5 K/uL   RBC 5.98 (H) 4.22 - 5.81 MIL/uL   Hemoglobin 18.2 (H) 13.0 - 17.0 g/dL   HCT 52.1 (H) 39.0 - 52.0 %   MCV 87.1 78.0 - 100.0 fL   MCH 30.4 26.0 - 34.0 pg   MCHC 34.9 30.0 - 36.0 g/dL   RDW 12.9 11.5 - 15.5 %   Platelets 203 150 - 400 K/uL    Comment: Performed at [facility], [place] [address], [place], [place] [address]  HIV antibody (Routine Testing)     Status: None   Collection Time: 06/26/17  3:30 AM  Result Value Ref Range   HIV Screen 4th Generation wRfx Non Reactive Non Reactive    Comment: (NOTE) Performed  At: [facility] [place], [place] [id number] Rush Farmer MD IR:[id number] Performed at [facility], [place] [address], [place] [address]   CBC     Status: None   Collection Time: 06/26/17  3:30 AM  Result Value Ref Range   WBC 6.2 4.0 - 10.5 K/uL   RBC 5.36 4.22 - 5.81 MIL/uL   Hemoglobin 16.3 13.0 - 17.0 g/dL   HCT 46.2 39.0 - 52.0 %   MCV 86.2 78.0 - 100.0 fL   MCH 30.4 26.0 - 34.0 pg   MCHC 35.3 30.0 - 36.0 g/dL   RDW 12.8 11.5 - 15.5 %   Platelets 153 150 - 400 K/uL    Comment: Performed at [facility] [address] [place], [place] [address]  Basic metabolic panel     Status: Abnormal   Collection Time: 06/26/17  3:30 AM  Result Value Ref Range   Sodium 135 135 - 145 mmol/L   Potassium 3.7 3.5 - 5.1 mmol/L   Chloride 102 101 - 111 mmol/L   CO2 20 (L) 22 - 32 mmol/L   Glucose, Bld 87 65 - 99 mg/dL   BUN 12 6 - 20 mg/dL   Creatinine, Ser 1.10 0.61 - 1.24 mg/dL   Calcium 8.5 (L) 8.9 - 10.3 mg/dL   GFR calc non Af Amer >60 >60 mL/min   GFR calc Af Amer >60 >60 mL/min    Comment: (NOTE) The eGFR has been calculated using the CKD EPI equation. This calculation has not been validated in all clinical situations. eGFR's persistently <60 mL/min signify possible Chronic Kidney Disease.    Anion gap 13 5 - 15    Comment: Performed at [facility] [address] [place], [place] [address]  Glucose, capillary     Status: None   Collection Time: 06/27/17 12:02 AM  Result Value Ref Range   Glucose-Capillary 87 65 - 99 mg/dL  Glucose, capillary     Status: None   Collection Time: 06/27/17  6:31 AM  Result Value Ref Range   Glucose-Capillary 85 65 - 99 mg/dL  Glucose, capillary     Status: None   Collection Time: 06/27/17 11:59 AM  Result Value Ref Range   Glucose-Capillary 90 65 - 99 mg/dL   Comment 1 Notify RN    Ct Abdomen Pelvis W Contrast  Result Date: 06/25/2017 CLINICAL DATA:  47 y/o M; generalized abdominal  pain with nausea, vomiting, and diarrhea that began yesterday. History of small bowel obstruction. EXAM: CT ABDOMEN AND PELVIS WITH CONTRAST TECHNIQUE: Multidetector CT imaging of the abdomen and pelvis was performed using the standard protocol following bolus administration of intravenous contrast. CONTRAST:  169m ISOVUE-300 IOPAMIDOL (ISOVUE-300) INJECTION 61% COMPARISON:  12/13/2015 and 07/15/2014 CT abdomen and pelvis. FINDINGS: Lower chest: No acute abnormality. Hepatobiliary: Hepatic steatosis. No focal liver abnormality is seen. Status post cholecystectomy. No biliary dilatation. Pancreas: Unremarkable. No pancreatic ductal dilatation or surrounding inflammatory changes. Spleen: Normal in size without focal abnormality. Adrenals/Urinary Tract: Adrenal glands are unremarkable. Bilateral renal cysts measuring up to 8.7 cm in right interpolar kidney, mildly increased in size. Otherwise kidneys are normal, without renal calculi, focal lesion, or hydronephrosis. Bladder is unremarkable. Stomach/Bowel: Sleeve gastrectomy. Mild dilatation of proximal loops of small bowel with decompressed distal loops of small bowel compatible obstruction. No bowel  wall thickening or findings of perforation. Mild mesenteric stranding and prominent lymph nodes. Colon is unremarkable. Vascular/Lymphatic: No significant vascular findings are present. No enlarged abdominal or pelvic lymph nodes. Reproductive: Prostate is unremarkable. Other: No abdominal wall hernia or abnormality. No abdominopelvic ascites. Musculoskeletal: No acute or significant osseous findings. IMPRESSION: 1. Mild diffuse dilatation of proximal small bowel with decompressed distal small bowel compatible with small bowel obstruction, likely partial. No findings of perforation or abscess. 2. Persistent misty mesentery with prominent lymph nodes may representing reactive inflammation. Sclerosing mesenteritis, lymphoproliferative disorder, or third-spacing could have  this appearance. 3. Hepatic steatosis. Electronically Signed   By: Kristine Garbe M.D.   On: 06/25/2017 19:32   Dg Abd 2 Views  Result Date: 06/27/2017 CLINICAL DATA:  Abdominal distention. Constipation. Small bowel obstruction. EXAM: ABDOMEN - 2 VIEW COMPARISON:  06/26/2017. FINDINGS: Distended loops of small bowel are noted. Colonic gas pattern is unremarkable. These findings may be related adynamic ileus however to evaluate for developing small bowel obstruction follow-up abdominal series suggested. No free air. Surgical clips right upper quadrant. Degenerative changes lumbar spine. IMPRESSION: Several dilated loops of air-filled small bowel are noted. Colonic gas pattern is unremarkable. These findings may be related to adynamic ileus however to evaluate for developing small bowel obstruction follow-up abdominal series suggested. No free air. Electronically Signed   By: Marcello Moores  Register   On: 06/27/2017 11:59   Dg Abd Acute W/chest  Result Date: 06/26/2017 CLINICAL DATA:  Abdominal pain, small bowel obstruction. EXAM: DG ABDOMEN ACUTE W/ 1V CHEST COMPARISON:  Abdominal and pelvic CT scan of June 25, 2017. Chest x-ray dated October 19, 2015. FINDINGS: The lungs are adequately inflated. The interstitial markings are coarse though stable. There is no alveolar infiltrate or pleural effusion. The heart and pulmonary vascularity are normal. The mediastinum is normal in width. Within the abdomen there are small amounts of distended gas-filled small bowel observed. There is gas in the colon and rectum today. There surgical clips in the gallbladder fossa. There are no abnormal soft tissue calcifications. No free extraluminal gas collections are observed. IMPRESSION: Persistent partial mid small bowel obstruction. There may have been slight interval improvement since yesterday's study. Mild chronic bronchitic changes, stable. Electronically Signed   By: David  Martinique M.D.   On: 06/26/2017 13:27       Assessment/Plan HTN Obesity Tobacco abuse  SBO - prior h/o laparoscopic gastric sleeve ~2012 in [place], lap chole 02/2015 Dr. Ninfa Linden  - multiple SBOs in the past treated medically - CT scan showed mild diffuse dilatation of proximal small bowel with decompressed distal small bowel compatible with small bowel obstruction - WBC WNL, VSS - passing some flatus now and had a tiny mucousy BM  ID - none VTE - SCDs, lovenox FEN - IVF, NPO Foley - none Follow up - TBD  Plan - Will start patient on small bowel protocol. He refused NG tube on admission and currently has no n/v, so he may just drink the gastrograffin and then we will obtain 8 hour delayed abdominal film. Will continue to follow.  Wellington Hampshire, [facility] Surgery 06/27/2017, 3:07 PM Pager: [phone] Consults: [phone] Mon-Fri 7:00 am-4:30 pm Sat-Sun 7:00 am-11:30 am

## 2017-06-28 ENCOUNTER — Inpatient Hospital Stay (HOSPITAL_COMMUNITY): Payer: BC Managed Care – PPO

## 2017-06-28 LAB — CBC
HEMATOCRIT: 44.3 % (ref 39.0–52.0)
HEMOGLOBIN: 15.4 g/dL (ref 13.0–17.0)
MCH: 29.9 pg (ref 26.0–34.0)
MCHC: 34.8 g/dL (ref 30.0–36.0)
MCV: 86 fL (ref 78.0–100.0)
Platelets: 154 10*3/uL (ref 150–400)
RBC: 5.15 MIL/uL (ref 4.22–5.81)
RDW: 12.6 % (ref 11.5–15.5)
WBC: 3.7 10*3/uL — ABNORMAL LOW (ref 4.0–10.5)

## 2017-06-28 LAB — BASIC METABOLIC PANEL
Anion gap: 8 (ref 5–15)
BUN: <5 mg/dL — ABNORMAL LOW (ref 6–20)
CHLORIDE: 105 mmol/L (ref 101–111)
CO2: 25 mmol/L (ref 22–32)
CREATININE: 0.93 mg/dL (ref 0.61–1.24)
Calcium: 8.3 mg/dL — ABNORMAL LOW (ref 8.9–10.3)
GFR calc non Af Amer: >60 mL/min (ref >60)
Glucose, Bld: 91 mg/dL (ref 65–99)
Potassium: 3.3 mmol/L — ABNORMAL LOW (ref 3.5–5.1)
Sodium: 138 mmol/L (ref 135–145)

## 2017-06-28 LAB — GLUCOSE, CAPILLARY
GLUCOSE-CAPILLARY: 95 mg/dL (ref 65–99)
GLUCOSE-CAPILLARY: 74 mg/dL (ref 65–99)
Glucose-Capillary: 84 mg/dL (ref 65–99)
Glucose-Capillary: 90 mg/dL (ref 65–99)

## 2017-06-28 MED ORDER — AMLODIPINE BESYLATE 5 MG PO TABS
5.0000 mg | ORAL_TABLET | Freq: Every day | ORAL | Status: DC
  Administered 2017-06-28 – 2017-06-29 (×2): 5 mg via ORAL
  Filled 2017-06-28 (×2): qty 1

## 2017-06-28 MED ORDER — SENNOSIDES-DOCUSATE SODIUM 8.6-50 MG PO TABS
2.0000 | ORAL_TABLET | Freq: Two times a day (BID) | ORAL | Status: DC
  Administered 2017-06-28 – 2017-06-29 (×3): 2 via ORAL
  Filled 2017-06-28 (×4): qty 2

## 2017-06-28 MED ORDER — BISACODYL 10 MG RE SUPP
10.0000 mg | Freq: Once | RECTAL | Status: AC
  Administered 2017-06-28: 10 mg via RECTAL
  Filled 2017-06-28: qty 1

## 2017-06-28 NOTE — Final Consult Note (Signed)
Consultant Final Sign-Off Note    Assessment/Final recommendations  [name] [name] is a 47 y.o. male followed by me for SBO.    Wound care (if applicable):    Diet at discharge: soft diet for 3-5 days    Activity at discharge: per primary team   Follow-up appointment:  None needed    Pending results:  Unresulted Labs (From admission, onward)   None       Medication recommendations: recommend daily colace or miralax at discharge, discussed with pt   Other recommendations: can advance diet as tolerated    Thank you for allowing [place] to participate in the care of your patient!  Please consult [place] again if you have further needs for your patient.  Kalman Drape 06/28/2017 9:20 AM    Subjective   CC: bloating  Patient states he had a liquid green BM last evening. He states his abdominal pain has resolved but he still feels bloated and feels the need to have a bowel movement. He has not passed any solid stool since admission. He has not had any vomiting. He had mild nausea overnight that resolved. I discussed with the patient that there was contrast in his colon seen on abdominal x-ray this a.m.   Objective  Vital signs in last 24 hours: Temp:  [97.5 F (36.4 C)-98.7 F (37.1 C)] 98.7 F (37.1 C) (04/26 0545) Pulse Rate:  [60-80] 80 (04/26 0545) Resp:  [16-20] 16 (04/26 0545) BP: (134-160)/(82-99) 142/82 (04/26 0545) SpO2:  [98 %-99 %] 99 % (04/26 0545)  General: well-appearing, no acute distress Lungs: Rate and effort normal Abdomen: Obese, soft, normal bowel sounds, very mild TTP of lower abdomen without guarding Skin: Warm and dry   Pertinent labs and Studies: Recent Labs    06/25/17 1630 06/26/17 0330 06/28/17 0518  WBC 5.2 6.2 3.7*  HGB 18.2* 16.3 15.4  HCT 52.1* 46.2 44.3   BMET Recent Labs    06/26/17 0330 06/28/17 0518  NA 135 138  K 3.7 3.3*  CL 102 105  CO2 20* 25  GLUCOSE 87 91  BUN 12 <5*  CREATININE 1.10 0.93  CALCIUM 8.5* 8.3*    No results for input(s): LABURIN in the last 72 hours. Results for orders placed or performed during the hospital encounter of 05/31/15  C difficile quick scan w PCR reflex     Status: None   Collection Time: 05/31/15  8:43 PM  Result Value Ref Range Status   C Diff antigen NEGATIVE NEGATIVE Final   C Diff toxin NEGATIVE NEGATIVE Final   C Diff interpretation Negative for toxigenic C. difficile  Final  Gastrointestinal Panel by PCR , Stool     Status: None   Collection Time: 06/01/15  3:15 PM  Result Value Ref Range Status   Campylobacter species NOT DETECTED NOT DETECTED Final   Plesimonas shigelloides NOT DETECTED NOT DETECTED Final   Salmonella species NOT DETECTED NOT DETECTED Final   Yersinia enterocolitica NOT DETECTED NOT DETECTED Final   Vibrio species NOT DETECTED NOT DETECTED Final   Vibrio cholerae NOT DETECTED NOT DETECTED Final   Enteroaggregative E coli (EAEC) NOT DETECTED NOT DETECTED Final   Enteropathogenic E coli (EPEC) NOT DETECTED NOT DETECTED Final   Enterotoxigenic E coli (ETEC) NOT DETECTED NOT DETECTED Final   Shiga like toxin producing E coli (STEC) NOT DETECTED NOT DETECTED Final   E. coli O157 NOT DETECTED NOT DETECTED Final   Shigella/Enteroinvasive E coli (EIEC) NOT DETECTED NOT DETECTED Final  Cryptosporidium NOT DETECTED NOT DETECTED Final   Cyclospora cayetanensis NOT DETECTED NOT DETECTED Final   Entamoeba histolytica NOT DETECTED NOT DETECTED Final   Giardia lamblia NOT DETECTED NOT DETECTED Final   Adenovirus F40/41 NOT DETECTED NOT DETECTED Final   Astrovirus NOT DETECTED NOT DETECTED Final   Norovirus GI/GII NOT DETECTED NOT DETECTED Final   Rotavirus A NOT DETECTED NOT DETECTED Final   Sapovirus (I, II, IV, and V) NOT DETECTED NOT DETECTED Final    Imaging: Dg Abd 2 Views  Result Date: 06/27/2017 CLINICAL DATA:  Abdominal distention. Constipation. Small bowel obstruction. EXAM: ABDOMEN - 2 VIEW COMPARISON:  06/26/2017. FINDINGS:  Distended loops of small bowel are noted. Colonic gas pattern is unremarkable. These findings may be related adynamic ileus however to evaluate for developing small bowel obstruction follow-up abdominal series suggested. No free air. Surgical clips right upper quadrant. Degenerative changes lumbar spine. IMPRESSION: Several dilated loops of air-filled small bowel are noted. Colonic gas pattern is unremarkable. These findings may be related to adynamic ileus however to evaluate for developing small bowel obstruction follow-up abdominal series suggested. No free air. Electronically Signed   By: Marcello Moores  Register   On: 06/27/2017 11:59   Dg Abd Portable 1v-small Bowel Obstruction Protocol-initial, 8 Hr Delay  Result Date: 06/28/2017 CLINICAL DATA:  Small bowel obstruction. EXAM: PORTABLE ABDOMEN - 1 VIEW COMPARISON:  Abdominal radiograph June 27, 2017 FINDINGS: Single of the gas distended small bowel 3.5 cm 1.8 cm. Contrast in the colon. No intra-abdominal mass effect or pathologic calcifications. Large body habitus. Surgical clips in the included right abdomen compatible with cholecystectomy. Soft tissue planes and included osseous structures are nonsuspicious. IMPRESSION: Resolving ileus or small-bowel obstruction. Electronically Signed   By: Elon Alas M.D.   On: 06/28/2017 04:59

## 2017-06-28 NOTE — Progress Notes (Signed)
During shift change report patient informed nurse that he had taken all of the gastrografin.  Nurse called radiology to inform them so they could complete x ray after 8 hr time frame.

## 2017-06-28 NOTE — Progress Notes (Signed)
Pt to Ultrasound per bed.

## 2017-06-28 NOTE — Progress Notes (Signed)
Patient Demographics:    [name] [name], is a 47 y.o. male, DOB - 09/28/1970, FWY:[id number]  Admit date - 06/25/2017   Admitting Physician Norval Morton, MD  Outpatient Primary MD for the patient is [name], NP  LOS - 2   Chief Complaint  Patient presents with  . Abdominal Pain  . Emesis        Subjective:    [name] [name] today has no fevers, no emesis,  No chest pain, tolerating clear liquid diet okay, had liquid bowel movement last evening, passing some gas  Assessment  & Plan :    Principal Problem:   SBO (small bowel obstruction) (HCC) Active Problems:   Essential hypertension   Small bowel obstruction (HCC)   Chronic bronchitis (HCC)   Dehydration   AKI (acute kidney injury) ([place])  1)Persistent partial  small bowel obstruction Vs Ileus. -----No emesis, abdominal pain is improving,  follow-up abdominal x-ray/ SB follow through shows contrast in colon, surgical consult appreciated, clear liquid diet as advised, Senokot as is advised  2)Tobacco abuse- smoking cessation advised  3)AKI/acute kidney injury-Baseline creatinine 0.9, creatinine on admission on 06/25/2016 was 1.32, creatinine is back down to 0.9, hydrate and avoid nephrotoxic agent, overall acute kidney injury is resolved  4)HTN-BP starting to trend high, restart amlodipine 5 mg daily, may use IV Hydralazine 10 mg  Every 4 hours Prn for systolic blood pressure over 160 mmhg   Code Status : full   Disposition Plan  : home  Consults  :  gen surg   DVT Prophylaxis  :  Lovenox    Lab Results  Component Value Date   PLT 154 06/28/2017    Inpatient Medications  Scheduled Meds: . amLODipine  5 mg Oral Daily  . enoxaparin (LOVENOX) injection  90 mg Subcutaneous Q24H  . nicotine  14 mg Transdermal Daily   Continuous Infusions: . dextrose 5 % and 0.45% NaCl 125 mL/hr at 06/28/17 0049  . famotidine  (PEPCID) IV Stopped (06/28/17 0949)   PRN Meds:.albuterol, hydrALAZINE, mineral oil, morphine injection, ondansetron (ZOFRAN) IV    Anti-infectives (From admission, onward)   None        Objective:   Vitals:   06/27/17 2053 06/27/17 2201 06/28/17 0545 06/28/17 1410  BP: (!) 160/86 (!) 135/92 (!) 142/82 (!) 155/102  Pulse: 76 75 80 70  Resp: 16  16   Temp: 98.5 F (36.9 C)  98.7 F (37.1 C) 97.9 F (36.6 C)  TempSrc: Oral  Oral Oral  SpO2: 99%  99% 97%  Weight:      Height:        Wt Readings from Last 3 Encounters:  06/26/17 (!) 177 kg (390 lb 3.4 oz)  05/10/17 (!) 186 kg (410 lb)  04/11/17 (!) 186.3 kg (410 lb 12 oz)     Intake/Output Summary (Last 24 hours) at 06/28/2017 1419 Last data filed at 06/28/2017 1207 Gross per 24 hour  Intake 1166.25 ml  Output -  Net 1166.25 ml     Physical Exam  Gen:- Awake Alert,  In no apparent distress , obese HEENT:- [place].AT, No sclera icterus Neck-Supple Neck,No JVD,.  Lungs-  CTAB , good air movement CV- S1, S2 normal Abd-  +ve B.Sounds,  Abd Soft, mild discomfort on palpation, denies any significant tenderness, increased truncal adiposity noted Extremity/Skin:- No  edema,   good pulses  psych-affect is appropriate, oriented x3 Neuro-no new focal deficits, no tremors   Data Review:   Micro Results No results found for this or any previous visit (from the past 240 hour(s)).  Radiology Reports Ct Abdomen Pelvis W Contrast  Result Date: 06/25/2017 CLINICAL DATA:  46 y/o M; generalized abdominal pain with nausea, vomiting, and diarrhea that began yesterday. History of small bowel obstruction. EXAM: CT ABDOMEN AND PELVIS WITH CONTRAST TECHNIQUE: Multidetector CT imaging of the abdomen and pelvis was performed using the standard protocol following bolus administration of intravenous contrast. CONTRAST:  149mL ISOVUE-300 IOPAMIDOL (ISOVUE-300) INJECTION 61% COMPARISON:  12/13/2015 and 07/15/2014 CT abdomen and pelvis. FINDINGS:  Lower chest: No acute abnormality. Hepatobiliary: Hepatic steatosis. No focal liver abnormality is seen. Status post cholecystectomy. No biliary dilatation. Pancreas: Unremarkable. No pancreatic ductal dilatation or surrounding inflammatory changes. Spleen: Normal in size without focal abnormality. Adrenals/Urinary Tract: Adrenal glands are unremarkable. Bilateral renal cysts measuring up to 8.7 cm in right interpolar kidney, mildly increased in size. Otherwise kidneys are normal, without renal calculi, focal lesion, or hydronephrosis. Bladder is unremarkable. Stomach/Bowel: Sleeve gastrectomy. Mild dilatation of proximal loops of small bowel with decompressed distal loops of small bowel compatible obstruction. No bowel wall thickening or findings of perforation. Mild mesenteric stranding and prominent lymph nodes. Colon is unremarkable. Vascular/Lymphatic: No significant vascular findings are present. No enlarged abdominal or pelvic lymph nodes. Reproductive: Prostate is unremarkable. Other: No abdominal wall hernia or abnormality. No abdominopelvic ascites. Musculoskeletal: No acute or significant osseous findings. IMPRESSION: 1. Mild diffuse dilatation of proximal small bowel with decompressed distal small bowel compatible with small bowel obstruction, likely partial. No findings of perforation or abscess. 2. Persistent misty mesentery with prominent lymph nodes may representing reactive inflammation. Sclerosing mesenteritis, lymphoproliferative disorder, or third-spacing could have this appearance. 3. Hepatic steatosis. Electronically Signed   By: Kristine Garbe M.D.   On: 06/25/2017 19:32   Dg Abd 2 Views  Result Date: 06/27/2017 CLINICAL DATA:  Abdominal distention. Constipation. Small bowel obstruction. EXAM: ABDOMEN - 2 VIEW COMPARISON:  06/26/2017. FINDINGS: Distended loops of small bowel are noted. Colonic gas pattern is unremarkable. These findings may be related adynamic ileus however to  evaluate for developing small bowel obstruction follow-up abdominal series suggested. No free air. Surgical clips right upper quadrant. Degenerative changes lumbar spine. IMPRESSION: Several dilated loops of air-filled small bowel are noted. Colonic gas pattern is unremarkable. These findings may be related to adynamic ileus however to evaluate for developing small bowel obstruction follow-up abdominal series suggested. No free air. Electronically Signed   By: Marcello Moores  Register   On: 06/27/2017 11:59   Dg Abd Acute W/chest  Result Date: 06/26/2017 CLINICAL DATA:  Abdominal pain, small bowel obstruction. EXAM: DG ABDOMEN ACUTE W/ 1V CHEST COMPARISON:  Abdominal and pelvic CT scan of June 25, 2017. Chest x-ray dated October 19, 2015. FINDINGS: The lungs are adequately inflated. The interstitial markings are coarse though stable. There is no alveolar infiltrate or pleural effusion. The heart and pulmonary vascularity are normal. The mediastinum is normal in width. Within the abdomen there are small amounts of distended gas-filled small bowel observed. There is gas in the colon and rectum today. There surgical clips in the gallbladder fossa. There are no abnormal soft tissue calcifications. No free extraluminal gas collections are observed. IMPRESSION: Persistent partial mid small bowel obstruction. There may  have been slight interval improvement since yesterday's study. Mild chronic bronchitic changes, stable. Electronically Signed   By: David  Martinique M.D.   On: 06/26/2017 13:27   Dg Abd Portable 1v-small Bowel Obstruction Protocol-initial, 8 Hr Delay  Result Date: 06/28/2017 CLINICAL DATA:  Small bowel obstruction. EXAM: PORTABLE ABDOMEN - 1 VIEW COMPARISON:  Abdominal radiograph June 27, 2017 FINDINGS: Single of the gas distended small bowel 3.5 cm 1.8 cm. Contrast in the colon. No intra-abdominal mass effect or pathologic calcifications. Large body habitus. Surgical clips in the included right abdomen  compatible with cholecystectomy. Soft tissue planes and included osseous structures are nonsuspicious. IMPRESSION: Resolving ileus or small-bowel obstruction. Electronically Signed   By: Elon Alas M.D.   On: 06/28/2017 04:59     CBC Recent Labs  Lab 06/25/17 1630 06/26/17 0330 06/28/17 0518  WBC 5.2 6.2 3.7*  HGB 18.2* 16.3 15.4  HCT 52.1* 46.2 44.3  PLT 203 153 154  MCV 87.1 86.2 86.0  MCH 30.4 30.4 29.9  MCHC 34.9 35.3 34.8  RDW 12.9 12.8 12.6    Chemistries  Recent Labs  Lab 06/25/17 1630 06/26/17 0330 06/28/17 0518  NA 137 135 138  K 3.9 3.7 3.3*  CL 100* 102 105  CO2 24 20* 25  GLUCOSE 102* 87 91  BUN 8 12 <5*  CREATININE 1.32* 1.10 0.93  CALCIUM 9.5 8.5* 8.3*  AST 26  --   --   ALT 29  --   --   ALKPHOS 67  --   --   BILITOT 1.1  --   --    ------------------------------------------------------------------------------------------------------------------ No results for input(s): CHOL, HDL, LDLCALC, TRIG, CHOLHDL, LDLDIRECT in the last 72 hours.  Lab Results  Component Value Date   HGBA1C 5.0 12/21/2015   ------------------------------------------------------------------------------------------------------------------ No results for input(s): TSH, T4TOTAL, T3FREE, THYROIDAB in the last 72 hours.  Invalid input(s): FREET3 ------------------------------------------------------------------------------------------------------------------ No results for input(s): VITAMINB12, FOLATE, FERRITIN, TIBC, IRON, RETICCTPCT in the last 72 hours.  Coagulation profile No results for input(s): INR, PROTIME in the last 168 hours.  No results for input(s): DDIMER in the last 72 hours.  Cardiac Enzymes No results for input(s): CKMB, TROPONINI, MYOGLOBIN in the last 168 hours.  Invalid input(s): CK ------------------------------------------------------------------------------------------------------------------ No results found for: BNP   Roxan Hockey M.D on  06/28/2017 at 2:19 PM  Between 7am to 7pm - Pager - [phone]  After 7pm go to [email or web address] - password [email or web address]  [facility] -  Office  [phone]   Voice Recognition Viviann Spare dictation system was used to create this note, attempts have been made to correct errors. Please contact the author with questions and/or clarifications.

## 2017-06-29 ENCOUNTER — Inpatient Hospital Stay (HOSPITAL_COMMUNITY): Payer: BC Managed Care – PPO

## 2017-06-29 LAB — GLUCOSE, CAPILLARY
GLUCOSE-CAPILLARY: 91 mg/dL (ref 65–99)
Glucose-Capillary: 86 mg/dL (ref 65–99)
Glucose-Capillary: 105 mg/dL — ABNORMAL HIGH (ref 65–99)

## 2017-06-29 MED ORDER — NICOTINE 14 MG/24HR TD PT24: 14.0000 mg | MEDICATED_PATCH | Freq: Every day | TRANSDERMAL | 0 refills | Status: DC

## 2017-06-29 MED ORDER — POLYETHYLENE GLYCOL 3350 17 G PO PACK: 17.0000 g | PACK | Freq: Every day | ORAL | 2 refills | Status: DC

## 2017-06-29 MED ORDER — ONDANSETRON 4 MG PO TBDP: 4.0000 mg | ORAL_TABLET | Freq: Three times a day (TID) | ORAL | 0 refills | Status: DC | PRN

## 2017-06-29 NOTE — Discharge Instructions (Signed)
1)Soft diet advised for the next 5 days and then slowly advance diet as tolerated 2)Follow-up with your primary care doctor within a week for recheck,  3)Call or return to the emergency department if persistent nausea, vomiting or persistent abdominal pain 4)Take MiraLAX daily with a goal to have a bowel movement every day 5)Maintain adequate hydration

## 2017-06-29 NOTE — Progress Notes (Signed)
Pt requests diet advanced, paged MD on call, stated can advance diet as tolerated.

## 2017-06-29 NOTE — Discharge Summary (Signed)
[name] [name], is a 47 y.o. male  DOB 03/18/1970  MRN [id number].  Admission date:  06/25/2017  Admitting Physician  Norval Morton, MD  Discharge Date:  06/29/2017   Primary MD  [name], NP  Recommendations for primary care physician for things to follow:   1)Soft diet advised for the next 5 days and then slowly advance diet as tolerated 2)Follow-up with your primary care doctor within a week for recheck,  3)Call or return to the emergency department if persistent nausea, vomiting or persistent abdominal pain 4)Take MiraLAX daily with a goal to have a bowel movement every day 5)Maintain adequate hydration  Admission Diagnosis  Dehydration [E86.0] SBO (small bowel obstruction) ([place]) [K56.609]  Discharge Diagnosis  Dehydration [E86.0] SBO (small bowel obstruction) ([place]) [K56.609]    Principal Problem:   SBO (small bowel obstruction) ([place]) Active Problems:   Essential hypertension   Small bowel obstruction ([place])   Chronic bronchitis ([place])   Dehydration   AKI (acute kidney injury) ([place])      Past Medical History:  Diagnosis Date  . Asthma   . Bronchitis   . Chronic bronchitis ([place])   . Complication of anesthesia    usually wake up in the middle of procedure or surgery  . Gallstones   . GERD (gastroesophageal reflux disease)   . Hypertension   . Obesity   . Sleep apnea    "suppose to wear mask but I don't" (05/31/2015)    Past Surgical History:  Procedure Laterality Date  . CHOLECYSTECTOMY N/A 02/14/2015   Procedure: LAPAROSCOPIC CHOLECYSTECTOMY WITH INTRAOPERATIVE CHOLANGIOGRAM;  Surgeon: Coralie Keens, MD;  Location: [facility];  Service: General;  Laterality: N/A;  . COLONOSCOPY  10/16/2005   Polyps (tubular adenoma 2007), external hemorrhoids  . COLONOSCOPY WITH PROPOFOL N/A 01/05/2016   Procedure: COLONOSCOPY WITH PROPOFOL;  Surgeon: Milus Banister, MD;  Location: [facility]  ENDOSCOPY;  Service: Endoscopy;  Laterality: N/A;  . FINGER SURGERY Left    reconstructive - 2 surgeries on hand  . LAPAROSCOPIC GASTRIC SLEEVE RESECTION  10/2010    at [facility]       HPI  from the history and physical done on the day of admission:    HPI: [name] [name] is a 47 y.o. male with medical history significant of HTN, morbid obesity s/p gastric sleeve, chronic bronchitis, SBO, and GERD; who presents with complaints of abdominal pain with nausea and vomiting over the last 3-4 days.  He complains of sharp abdominal pain that is epigastric and radiates down.  The patient has had multiple episodes of non bloody and non-bilious emesis unable to keep any significant amount of food or liquids down.  Notes that his last bowel movement was approximately 5 days ago.  Denies any significant fever, chills, or chest pain.  He has had 2-3 similar episodes in the past which prompted him to come to the emergency department.  ED Course: Upon admission into the emergency department patient was noted to be Afebrile with O2 saturations noted as low  as 86% following IV pain medication. Labs revealed WBC 5.2 and hemoglobin 18.2, BUN 8, and creatinine 1.32.  CT scan showed mild diffuse dilation of the proximal small bowel compatible with small bowel obstruction.  Given 1 L normal saline IV fluids, morphine, and Zofran.  Patient refused NGT.  TRH called to admit.    Hospital Course:   Brief Summary 47 y.o. male with past medical history significant of HTN, morbid obesity s/p gastric sleeve, s/p prior lap chole, chronic bronchitis, h/o recurrent SBO, and GERD  who was admitted on 06/25/2017 with concerns about possible small bowel obstruction after presenting with abdominal pain with nausea and vomiting for 3-4 days prior to admission.    During patient hospital stay patient continued to insist on having solid food, he remained very, very unhappy about the inability to have solid food right from the onset,  patient refused NG tube placement.  Despite being told he was n.p.o. patient continue to drink water from the sink/faucet in his room by his own admission.  Pt threatened to leave AMA if we did not allow him to eat right away, please see Nursing documentaion from RN Tsehaines Gebru dated 06/27/17. Surgical consult was obtained, surgery recommended small bowel follow-through with contrast/Gastrografin Patient refused to drink contrast/Gastrografin initially, please see nursing documentation from Samantha Crimes dated 06/27/2017 and also attestation from Dr. Romana Juniper dated 06/27/2017, however after being told repeatedly that he would not be fed solid food until imaging studies were completed with contrast/gastrgrafin patient then reluctantly drank some oral contrast/Gastrografin to allow for small bowel follow-through to be completed.  Patient was subsequently started on liquid diet after results of imaging studies and further clinical evaluation, however patient remained unhappy and requested that diet be advanced very quickly.  Patient did not want liquid diet,  he wanted to advance to solids food right away.  Patient threatened to call his Lawyer, patient threatened to leave AMA and go to [facility] if we did not give him solid food right away.  Please note that throughout the patient's hospital stay each time I went to the patient's room, I  went in with an RN as patient was often hostile and very uncooperative.    Plan:-  1)Persistent Partial  small bowel obstruction Vs Ileus.-----Clinically and radiologically much improved, patient is tolerating oral intake well, including GI soft diet , no emesis, denies abdominal painfollow-up abdominal x-ray/ SB follow through shows contrast in colon, surgical consult appreciated.  Repeat x-rays from 06/29/2017 without obstructive changes, per surgical service okay to discharge home on MiraLAX.  Patient informed of the plan to discharge home, patient remains very  unhappy.  Upon discharge patient refused further physical exam.   2)Tobacco abuse- smoking cessation advised  3)AKI/acute kidney inSjury-Baseline creatinine 0.9, creatinine on admission on 06/25/2016 was 1.32, creatinine is back down to 0.9, overall acute kidney injury is resolved with hydration  4)HTN-stable, continue Amlodipine 5 mg daily,  low-salt diet advised   5)H/o GERD-status post prior gastric sleeve procedure, continue omeprazole  Discharge Condition: stable  Follow UP- with PCP,    Consults obtained - Gen Surg  Diet and Activity recommendation:  As advised  Discharge Instructions    Discharge Instructions    Call MD for:  difficulty breathing, headache or visual disturbances   Complete by:  As directed    Call MD for:  persistant dizziness or light-headedness   Complete by:  As directed    Call MD for:  persistant nausea and  vomiting   Complete by:  As directed    Call MD for:  severe uncontrolled pain   Complete by:  As directed    Call MD for:  temperature >100.4   Complete by:  As directed    Diet - low sodium heart healthy   Complete by:  As directed    Soft diet advised for the next 5 days and then slowly advance diet as tolerated   Discharge instructions   Complete by:  As directed    1)Soft diet advised for the next 5 days and then slowly advance diet as tolerated 2)Follow-up with your primary care doctor within a week for recheck,  3)Call or return to the emergency department if persistent nausea, vomiting or persistent abdominal pain 4)Take MiraLAX daily with a goal to have a bowel movement every day 5)Maintain adequate hydration   Increase activity slowly   Complete by:  As directed         Discharge Medications     Allergies as of 06/29/2017      Reactions   Bee Venom Anaphylaxis   Codeine Anaphylaxis   Tolerates dilaudid   Hornet Venom Anaphylaxis   Penicillins Hives, Other (See Comments)   Has patient had a PCN reaction causing  immediate rash, facial/tongue/throat swelling, SOB or lightheadedness with hypotensionYES Has patient had a PCN reaction causing severe rash involving mucus membranes or skin necrosis: NO Has patient had a PCN reaction that required hospitalization NO Has patient had a PCN reaction occurring within the last 10 years: NO If all of the above answers are "NO", then may proceed with Cephalosporin use.      Medication List    TAKE these medications   albuterol 108 (90 Base) MCG/ACT inhaler Commonly known as:  PROAIR HFA Inhale 2 puffs into the lungs every 6 (six) hours as needed for wheezing or shortness of breath.   albuterol 0.63 MG/3ML nebulizer solution Commonly known as:  ACCUNEB Take 3 mLs (0.63 mg total) by nebulization every 6 (six) hours as needed for wheezing or shortness of breath.   amLODipine 5 MG tablet Commonly known as:  NORVASC TAKE ONE TABLET BY MOUTH ONCE DAILY   benzonatate 100 MG capsule Commonly known as:  TESSALON Take 1-2 capsules (100-200 mg total) by mouth 3 (three) times daily as needed for cough.   cetirizine 10 MG tablet Commonly known as:  ZYRTEC Take 10 mg by mouth daily.   nicotine 14 mg/24hr patch Commonly known as:  NICODERM CQ - dosed in mg/24 hours Place 1 patch (14 mg total) onto the skin daily. Start taking on:  06/30/2017   omeprazole 20 MG capsule Commonly known as:  PRILOSEC Take 1 capsule (20 mg total) by mouth daily.   ondansetron 4 MG disintegrating tablet Commonly known as:  ZOFRAN ODT Take 1 tablet (4 mg total) by mouth every 8 (eight) hours as needed for nausea or vomiting.   polyethylene glycol packet Commonly known as:  MIRALAX Take 17 g by mouth daily.   sildenafil 20 MG tablet Commonly known as:  REVATIO Take 2-5 tablets by mouth 30 minutes prior to intercourse.      Major procedures and Radiology Reports - PLEASE review detailed and final reports for all details, in brief -   Ct Abdomen Pelvis W Contrast  Result  Date: 06/25/2017 CLINICAL DATA:  47 y/o M; generalized abdominal pain with nausea, vomiting, and diarrhea that began yesterday. History of small bowel obstruction. EXAM: CT ABDOMEN AND  PELVIS WITH CONTRAST TECHNIQUE: Multidetector CT imaging of the abdomen and pelvis was performed using the standard protocol following bolus administration of intravenous contrast. CONTRAST:  155mL ISOVUE-300 IOPAMIDOL (ISOVUE-300) INJECTION 61% COMPARISON:  12/13/2015 and 07/15/2014 CT abdomen and pelvis. FINDINGS: Lower chest: No acute abnormality. Hepatobiliary: Hepatic steatosis. No focal liver abnormality is seen. Status post cholecystectomy. No biliary dilatation. Pancreas: Unremarkable. No pancreatic ductal dilatation or surrounding inflammatory changes. Spleen: Normal in size without focal abnormality. Adrenals/Urinary Tract: Adrenal glands are unremarkable. Bilateral renal cysts measuring up to 8.7 cm in right interpolar kidney, mildly increased in size. Otherwise kidneys are normal, without renal calculi, focal lesion, or hydronephrosis. Bladder is unremarkable. Stomach/Bowel: Sleeve gastrectomy. Mild dilatation of proximal loops of small bowel with decompressed distal loops of small bowel compatible obstruction. No bowel wall thickening or findings of perforation. Mild mesenteric stranding and prominent lymph nodes. Colon is unremarkable. Vascular/Lymphatic: No significant vascular findings are present. No enlarged abdominal or pelvic lymph nodes. Reproductive: Prostate is unremarkable. Other: No abdominal wall hernia or abnormality. No abdominopelvic ascites. Musculoskeletal: No acute or significant osseous findings. IMPRESSION: 1. Mild diffuse dilatation of proximal small bowel with decompressed distal small bowel compatible with small bowel obstruction, likely partial. No findings of perforation or abscess. 2. Persistent misty mesentery with prominent lymph nodes may representing reactive inflammation. Sclerosing  mesenteritis, lymphoproliferative disorder, or third-spacing could have this appearance. 3. Hepatic steatosis. Electronically Signed   By: Kristine Garbe M.D.   On: 06/25/2017 19:32   Dg Abd 2 Views  Result Date: 06/29/2017 CLINICAL DATA:  Left-sided abdominal pain for 1 week.  Vomiting. EXAM: ABDOMEN - 2 VIEW COMPARISON:  06/28/2017 FINDINGS: No evidence of dilated bowel loops. Scattered air-fluid levels are again noted. No evidence of free intraperitoneal air. Right upper quadrant surgical clips from prior cholecystectomy. IMPRESSION: Nonspecific, nonobstructive bowel gas pattern. Electronically Signed   By: Earle Gell M.D.   On: 06/29/2017 13:40   Dg Abd 2 Views  Result Date: 06/28/2017 CLINICAL DATA:  Abdominal pain for 1 week EXAM: ABDOMEN - 2 VIEW COMPARISON:  06/28/2017 FINDINGS: Contrast in the colon seen earlier today has evacuated. Bowel gas pattern is nonobstructive. No concerning mass effect or calcification. Cholecystectomy clips. IMPRESSION: Colonic contrast seen earlier today has been evacuated. Nonobstructive bowel gas pattern. Electronically Signed   By: Monte Fantasia M.D.   On: 06/28/2017 20:48   Dg Abd 2 Views  Result Date: 06/27/2017 CLINICAL DATA:  Abdominal distention. Constipation. Small bowel obstruction. EXAM: ABDOMEN - 2 VIEW COMPARISON:  06/26/2017. FINDINGS: Distended loops of small bowel are noted. Colonic gas pattern is unremarkable. These findings may be related adynamic ileus however to evaluate for developing small bowel obstruction follow-up abdominal series suggested. No free air. Surgical clips right upper quadrant. Degenerative changes lumbar spine. IMPRESSION: Several dilated loops of air-filled small bowel are noted. Colonic gas pattern is unremarkable. These findings may be related to adynamic ileus however to evaluate for developing small bowel obstruction follow-up abdominal series suggested. No free air. Electronically Signed   By: Marcello Moores  Register    On: 06/27/2017 11:59   Dg Abd Acute W/chest  Result Date: 06/26/2017 CLINICAL DATA:  Abdominal pain, small bowel obstruction. EXAM: DG ABDOMEN ACUTE W/ 1V CHEST COMPARISON:  Abdominal and pelvic CT scan of June 25, 2017. Chest x-ray dated October 19, 2015. FINDINGS: The lungs are adequately inflated. The interstitial markings are coarse though stable. There is no alveolar infiltrate or pleural effusion. The heart and pulmonary vascularity are normal.  The mediastinum is normal in width. Within the abdomen there are small amounts of distended gas-filled small bowel observed. There is gas in the colon and rectum today. There surgical clips in the gallbladder fossa. There are no abnormal soft tissue calcifications. No free extraluminal gas collections are observed. IMPRESSION: Persistent partial mid small bowel obstruction. There may have been slight interval improvement since yesterday's study. Mild chronic bronchitic changes, stable. Electronically Signed   By: David  Martinique M.D.   On: 06/26/2017 13:27   Dg Abd Portable 1v-small Bowel Obstruction Protocol-initial, 8 Hr Delay  Result Date: 06/28/2017 CLINICAL DATA:  Small bowel obstruction. EXAM: PORTABLE ABDOMEN - 1 VIEW COMPARISON:  Abdominal radiograph June 27, 2017 FINDINGS: Single of the gas distended small bowel 3.5 cm 1.8 cm. Contrast in the colon. No intra-abdominal mass effect or pathologic calcifications. Large body habitus. Surgical clips in the included right abdomen compatible with cholecystectomy. Soft tissue planes and included osseous structures are nonsuspicious. IMPRESSION: Resolving ileus or small-bowel obstruction. Electronically Signed   By: Elon Alas M.D.   On: 06/28/2017 04:59    Micro Results   No results found for this or any previous visit (from the past 240 hour(s)).   Today   Subjective    [name] [name] seen  Today with RN with me, Patient informed of the plan to discharge home, patient remains very unhappy.   Upon discharge patient refused further physical exam.   Patient states he is tolerating oral intake well including GI soft diet, having bowel movements, no emesis.    Patient has been seen and examined prior to discharge   Objective   Blood pressure (!) 142/86, pulse 73, temperature 98.1 F (36.7 C), temperature source Oral, resp. rate 18, height 6\' 2"  (1.88 m), weight (!) 177 kg (390 lb 3.4 oz), SpO2 100 %.   Intake/Output Summary (Last 24 hours) at 06/29/2017 1528 Last data filed at 06/29/2017 5883 Gross per 24 hour  Intake 300 ml  Output -  Net 300 ml    Exam Gen:- Awake Alert,  In no apparent distress , obese Psych-unhappy, refusing further exam  Neuro-patient is standing after walking in and out out of the bathroom,  gait appears steady   Data Review   CBC w Diff:  Lab Results  Component Value Date   WBC 3.7 (L) 06/28/2017   HGB 15.4 06/28/2017   HCT 44.3 06/28/2017   PLT 154 06/28/2017   LYMPHOPCT 19 01/19/2017   MONOPCT 7 01/19/2017   EOSPCT 1 01/19/2017   BASOPCT 1 01/19/2017   CMP:  Lab Results  Component Value Date   NA 138 06/28/2017   K 3.3 (L) 06/28/2017   CL 105 06/28/2017   CO2 25 06/28/2017   BUN <5 (L) 06/28/2017   CREATININE 0.93 06/28/2017   PROT 8.2 (H) 06/25/2017   ALBUMIN 4.3 06/25/2017   BILITOT 1.1 06/25/2017   ALKPHOS 67 06/25/2017   AST 26 06/25/2017   ALT 29 06/25/2017   Total Discharge time is about 34 minutes  Roxan Hockey M.D on 06/29/2017 at 3:28 PM  [facility]   Office  [phone]  Voice Recognition Viviann Spare dictation system was used to create this note, attempts have been made to correct errors. Please contact the author with questions and/or clarifications.

## 2017-06-29 NOTE — Progress Notes (Signed)
Pt was discharged to go home.  Discharge instructions, Dr's note  and prescriptions information given.

## 2017-07-02 ENCOUNTER — Telehealth: Payer: Self-pay | Admitting: Primary Care

## 2017-07-02 NOTE — Telephone Encounter (Signed)
Please notify patient: We always recommend a hospital follow up for any potential complications that may arise between discharge and now. I recommend he come in for evaluation and repeat labs. Ultimately this is his decision. Please notify him that we hope he's feeling better!

## 2017-07-02 NOTE — Telephone Encounter (Signed)
Noted  

## 2017-07-02 NOTE — Telephone Encounter (Signed)
Called patient to schedule hosp f/u but he states he does not want to follow up.

## 2017-07-30 ENCOUNTER — Other Ambulatory Visit: Payer: Self-pay | Admitting: Family Medicine

## 2017-07-30 DIAGNOSIS — I10 Essential (primary) hypertension: Secondary | ICD-10-CM

## 2017-07-31 NOTE — Telephone Encounter (Signed)
Refill sent to pharmacy.   

## 2017-07-31 NOTE — Telephone Encounter (Signed)
[name] Monte- please see refill request. Thanks  TLG

## 2017-08-17 ENCOUNTER — Other Ambulatory Visit: Payer: Self-pay

## 2017-08-17 ENCOUNTER — Emergency Department (HOSPITAL_COMMUNITY)
Admission: EM | Admit: 2017-08-17 | Discharge: 2017-08-17 | Disposition: A | Discharge status: Home / Self Care | Payer: No Typology Code available for payment source | Attending: Emergency Medicine | Admitting: Emergency Medicine

## 2017-08-17 ENCOUNTER — Encounter (HOSPITAL_COMMUNITY): Payer: Self-pay

## 2017-08-17 DIAGNOSIS — Z79899 Other long term (current) drug therapy: Secondary | ICD-10-CM | POA: Diagnosis not present

## 2017-08-17 DIAGNOSIS — W268XXA Contact with other sharp object(s), not elsewhere classified, initial encounter: Secondary | ICD-10-CM | POA: Insufficient documentation

## 2017-08-17 DIAGNOSIS — Y9389 Activity, other specified: Secondary | ICD-10-CM | POA: Diagnosis not present

## 2017-08-17 DIAGNOSIS — Y92149 Unspecified place in prison as the place of occurrence of the external cause: Secondary | ICD-10-CM | POA: Insufficient documentation

## 2017-08-17 DIAGNOSIS — F17228 Nicotine dependence, chewing tobacco, with other nicotine-induced disorders: Secondary | ICD-10-CM | POA: Insufficient documentation

## 2017-08-17 DIAGNOSIS — Y990 Civilian activity done for income or pay: Secondary | ICD-10-CM | POA: Diagnosis not present

## 2017-08-17 DIAGNOSIS — J45909 Unspecified asthma, uncomplicated: Secondary | ICD-10-CM | POA: Insufficient documentation

## 2017-08-17 DIAGNOSIS — S61431A Puncture wound without foreign body of right hand, initial encounter: Secondary | ICD-10-CM | POA: Insufficient documentation

## 2017-08-17 DIAGNOSIS — S6991XA Unspecified injury of right wrist, hand and finger(s), initial encounter: Secondary | ICD-10-CM | POA: Diagnosis present

## 2017-08-17 DIAGNOSIS — I10 Essential (primary) hypertension: Secondary | ICD-10-CM | POA: Diagnosis not present

## 2017-08-17 DIAGNOSIS — F17210 Nicotine dependence, cigarettes, uncomplicated: Secondary | ICD-10-CM | POA: Insufficient documentation

## 2017-08-17 MED ORDER — DOXYCYCLINE HYCLATE 100 MG PO CAPS: 100.0000 mg | ORAL_CAPSULE | Freq: Two times a day (BID) | ORAL | 0 refills | Status: DC

## 2017-08-17 NOTE — ED Notes (Signed)
HB in to assess 

## 2017-08-17 NOTE — ED Notes (Addendum)
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

## 2017-08-17 NOTE — Discharge Instructions (Signed)
Please cleanse the puncture area with soap and water daily.  Please use doxycycline 2 times daily with food.  Please see Dr. Carlis Abbott, or return to the emergency department if any unusual swelling, pus like drainage from the puncture area, red streaks going up the arm, or signs of advancing infection.

## 2017-08-17 NOTE — ED Provider Notes (Signed)
[facility] EMERGENCY DEPARTMENT Provider Note   CSN: [id number] Arrival date & time: 08/17/17  1413     History   Chief Complaint Chief Complaint  Patient presents with  . Puncture Wound    HPI [name] [name] is a 47 y.o. male.  Patient is a 47 year old male who presents to the emergency department with a complaint of puncture wound to the right hand.  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  And he sustained a puncture wound from a homemade needle that was used to sew up shoes.  The patient was able to control bleeding by applying pressure.  He states that he thinks his tetanus status is up-to-date.  He is not on any anticoagulation medications, and has no history of bleeding disorder.  There is been no recent operations or procedures involving the right upper extremity.  He presents now at the request of his employer for evaluation.     Past Medical History:  Diagnosis Date  . Asthma   . Bronchitis   . Chronic bronchitis ([place])   . Complication of anesthesia    usually wake up in the middle of procedure or surgery  . Gallstones   . GERD (gastroesophageal reflux disease)   . Hypertension   . Obesity   . Sleep apnea    "suppose to wear mask but I don't" (05/31/2015)    Patient Active Problem List   Diagnosis Date Noted  . SBO (small bowel obstruction) ([place]) 06/26/2017  . Chronic bronchitis ([place]) 06/26/2017  . Dehydration 06/26/2017  . AKI (acute kidney injury) ([place]) 06/26/2017  . Erectile dysfunction 04/11/2017  . Lumbar back sprain 01/28/2017  . History of colonic polyps 12/28/2015  . Small bowel obstruction ([place]) 05/31/2015  . Hepatic steatosis 05/31/2015  . Essential hypertension 07/31/2014  . ANXIETY 11/14/2009  . DEPENDENT EDEMA 11/09/2008  . 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  . ADHD (attention deficit hyperactivity disorder) 10/21/2006  . GERD 10/21/2006  . Morbid obesity ([place]) 07/30/2006  . OSA (obstructive sleep apnea)  07/30/2006  . Hemorrhoids 10/03/2005    Past Surgical History:  Procedure Laterality Date  . CHOLECYSTECTOMY N/A 02/14/2015   Procedure: LAPAROSCOPIC CHOLECYSTECTOMY WITH INTRAOPERATIVE CHOLANGIOGRAM;  Surgeon: Coralie Keens, MD;  Location: [facility];  Service: General;  Laterality: N/A;  . COLONOSCOPY  10/16/2005   Polyps (tubular adenoma 2007), external hemorrhoids  . COLONOSCOPY WITH PROPOFOL N/A 01/05/2016   Procedure: COLONOSCOPY WITH PROPOFOL;  Surgeon: Milus Banister, MD;  Location: [facility];  Service: Endoscopy;  Laterality: N/A;  . FINGER SURGERY Left    reconstructive - 2 surgeries on hand  . LAPAROSCOPIC GASTRIC SLEEVE RESECTION  10/2010    at [facility] Medications    Prior to Admission medications   Medication Sig Start Date End Date Taking? Authorizing Provider  albuterol (ACCUNEB) 0.63 MG/3ML nebulizer solution Take 3 mLs (0.63 mg total) by nebulization every 6 (six) hours as needed for wheezing or shortness of breath. 05/10/17   Elby Beck, FNP  albuterol (PROAIR HFA) 108 [phone] Base) MCG/ACT inhaler Inhale 2 puffs into the lungs every 6 (six) hours as needed for wheezing or shortness of breath. 08/28/16   Copland, Frederico Hamman, MD  amLODipine (NORVASC) 5 MG tablet TAKE 1 TABLET BY MOUTH ONCE DAILY 07/31/17   [name], NP  benzonatate (TESSALON) 100 MG capsule Take 1-2 capsules (100-200 mg total) by mouth 3 (three) times daily as needed for cough. Patient  not taking: Reported on 06/26/2017 05/10/17   Elby Beck, FNP  cetirizine (ZYRTEC) 10 MG tablet Take 10 mg by mouth daily.    [provider]  nicotine (NICODERM CQ - DOSED IN MG/24 HOURS) 14 mg/24hr patch Place 1 patch (14 mg total) onto the skin daily. 06/30/17   Roxan Hockey, MD  omeprazole (PRILOSEC) 20 MG capsule Take 1 capsule (20 mg total) by mouth daily. 04/11/17   [name], NP  ondansetron (ZOFRAN ODT) 4 MG disintegrating tablet Take 1 tablet (4 mg total) by mouth every  8 (eight) hours as needed for nausea or vomiting. 06/29/17   Roxan Hockey, MD  polyethylene glycol (MIRALAX) packet Take 17 g by mouth daily. 06/29/17   Roxan Hockey, MD  sildenafil (REVATIO) 20 MG tablet Take 2-5 tablets by mouth 30 minutes prior to intercourse. 04/11/17   [name], NP    Family History Family History  Problem Relation Age of Onset  . Diabetes Mellitus II Mother   . Breast cancer Mother   . Ovarian cancer Mother   . Diabetes Mellitus II Father   . CAD Father   . Stroke Father   . Cancer Father        ?  . Clotting disorder Father        blood clots in brain with surgery  . Cirrhosis Maternal Grandmother   . Breast cancer Maternal Grandmother     Social History Social History   Tobacco Use  . Smoking status: Current Every Day Smoker    Packs/day: 1.00    Years: 20.00    Pack years: 20.00    Types: Cigarettes    Start date: 10/03/2016  . Smokeless tobacco: Current User    Types: Snuff  Substance Use Topics  . Alcohol use: Yes    Alcohol/week: 7.8 oz    Types: 13 Shots of liquor per week    Comment: "a couple of shots a day"  . Drug use: No     Allergies   Bee venom; Codeine; Hornet venom; and Penicillins   Review of Systems Review of Systems  Constitutional: Negative for activity change.       All ROS Neg except as noted in HPI  HENT: Negative for nosebleeds.   Eyes: Negative for photophobia and discharge.  Respiratory: Negative for cough, shortness of breath and wheezing.   Cardiovascular: Negative for chest pain and palpitations.  Gastrointestinal: Negative for abdominal pain and blood in stool.  Genitourinary: Negative for dysuria, frequency and hematuria.  Musculoskeletal: Negative for arthralgias, back pain and neck pain.  Skin: Negative.   Neurological: Negative for dizziness, seizures and speech difficulty.  Psychiatric/Behavioral: Negative for confusion and hallucinations.     Physical Exam Updated Vital Signs BP  (!) 153/104 (BP Location: Right Arm)   Pulse 83   Temp 98.9 F (37.2 C) (Oral)   Ht 6\' 2"  (1.88 m)   Wt (!) 186 kg (410 lb)   SpO2 98%   BMI 52.64 kg/m   Physical Exam  Constitutional: He is oriented to person, place, and time. He appears well-developed and well-nourished.  Non-toxic appearance.  HENT:  Head: Normocephalic.  Right Ear: Tympanic membrane and external ear normal.  Left Ear: Tympanic membrane and external ear normal.  Eyes: Pupils are equal, round, and reactive to light. EOM and lids are normal.  Neck: Normal range of motion. Neck supple. Carotid bruit is not present.  Cardiovascular: Normal rate, regular rhythm, normal heart sounds, intact  distal pulses and normal pulses.  Pulmonary/Chest: Breath sounds normal. No respiratory distress.  Abdominal: Soft. Bowel sounds are normal. There is no tenderness. There is no guarding.  Musculoskeletal: Normal range of motion.       Right hand: He exhibits tenderness. Normal sensation noted. Normal strength noted.       Hands: Lymphadenopathy:       Head (right side): No submandibular adenopathy present.       Head (left side): No submandibular adenopathy present.    He has no cervical adenopathy.  Neurological: He is alert and oriented to person, place, and time. He has normal strength. No cranial nerve deficit or sensory deficit.  Skin: Skin is warm and dry.  Psychiatric: He has a normal mood and affect. His speech is normal.  Nursing note and vitals reviewed.    ED Treatments / Results  Labs (all labs ordered are listed, but only abnormal results are displayed) Labs Reviewed - No data to display  EKG None  Radiology No results found.  Procedures Procedures (including critical care time)  Medications Ordered in ED Medications - No data to display   Initial Impression / Assessment and Plan / ED Course  I have reviewed the triage vital signs and the nursing notes.  Pertinent labs & imaging results that were  available during my care of the patient were reviewed by me and considered in my medical decision making (see chart for details).      Final Clinical Impressions(s) / ED Diagnoses MDM  Blood pressure is elevated at 153/104, otherwise the vital signs are within normal limits.  The patient sustained a puncture wound to the right hand, but he states that his tetanus status is up-to-date.  There is no significant hematoma appreciated.  There is no neurovascular compromise appreciated involving the right upper extremity.  The patient will be treated with an antibiotic due to puncture wound to the hand.  He is advised to follow-up with Dr. Carlis Abbott, or return to the emergency department if any signs of advancing infection.  Patient is in agreement with this plan.   Final diagnoses:  Puncture wound of right hand without foreign body, initial encounter    ED Discharge Orders    None       Lily Kocher, PA-C 08/17/17 1544    Nat Christen, MD 08/18/17 1306

## 2017-08-17 NOTE — ED Triage Notes (Addendum)
Pt was stuck with a sewing needle at work today and was told protocol is to come be seen at ED. Needle was used to stitch up a shoe at work. Pt stated penetrating wound drew blood. Workers Tax adviser.

## 2017-08-20 ENCOUNTER — Encounter: Payer: Self-pay | Admitting: Family Medicine

## 2017-08-20 ENCOUNTER — Ambulatory Visit
Admission: RE | Admit: 2017-08-20 | Discharge: 2017-08-20 | Disposition: A | Discharge status: Home / Self Care | Payer: BC Managed Care – PPO | Source: Ambulatory Visit | Attending: Family Medicine | Admitting: Family Medicine

## 2017-08-20 ENCOUNTER — Ambulatory Visit: Payer: BC Managed Care – PPO | Admitting: Family Medicine

## 2017-08-20 VITALS — BP 148/100 | HR 92 | Temp 98.1°F | Ht 74.0 in | Wt 398.8 lb

## 2017-08-20 DIAGNOSIS — I10 Essential (primary) hypertension: Secondary | ICD-10-CM

## 2017-08-20 DIAGNOSIS — R1084 Generalized abdominal pain: Secondary | ICD-10-CM

## 2017-08-20 DIAGNOSIS — R197 Diarrhea, unspecified: Secondary | ICD-10-CM

## 2017-08-20 DIAGNOSIS — Z9049 Acquired absence of other specified parts of digestive tract: Secondary | ICD-10-CM | POA: Insufficient documentation

## 2017-08-20 NOTE — Assessment & Plan Note (Signed)
Mild generalized abd pain in morbidly obese pt with hx of recurrent SBO (vomiting resolved/some loose stool)  Ordered abd xray  Labs  Stable/reassuring exam Disc imp of hydration to avoid hospitalization  Bland diet  If worse-go to ED/voiced understanding  Needed work note immed - out today and tomorrow with update

## 2017-08-20 NOTE — Assessment & Plan Note (Signed)
Mild generalized abd pain in morbidly obese pt with hx of recurrent SBO (vomiting resolved/some loose stool)  Does have IBS baseline  Ordered abd xray  Labs  Stable/reassuring exam Disc imp of hydration to avoid hospitalization  Bland diet  If worse-go to ED/voiced understanding  Needed work note immed - out today and tomorrow with update

## 2017-08-20 NOTE — Patient Instructions (Signed)
Eat a very bland diet (crackers/rice) small amounts Keep sipping fluids constantly to prevent dehydration  Xray now of abdomen  Labs   Stay out of work today-tomorrow If symptoms worsen-get to the ER please

## 2017-08-20 NOTE — Assessment & Plan Note (Signed)
bp high BP: (!) 148/100    May be from discomfort Will need to f/u with pcp

## 2017-08-20 NOTE — Progress Notes (Signed)
Subjective:    Patient ID: [name] [name], male    DOB: 02-20-1971, 47 y.o.   MRN: [id number]  HPI Here for c/o diarrhea  47 yo pt of NP Clark    Smoker  High bp BP: (!) 148/100 Pulse Rate: 92    hx of SBO in April Several of these over the years   Surgery 2011 -bariatric sleeve , and nothing but trouble since then   Hx of puncture wound of finger 6/15  Never started the doxycycline that was given -fine w/o it    Has stomach issues -for a while  1-2 times per year  Has had extensive w/u and no one has told him what it is   Current flare- started last night  Had a headache  Whole abdomen felt hot and burning and a little heartburn (takes omeprazole)  Then diarrhea  Watery  No blood  About 10-12 bm since last night (not unusual for him)  Still has headache  Drinking fluids - not as much as he should  (a challenge because he gets too full)  No fever  Some cold chills last night however  Temp: 98.1 F (36.7 C)   Right now- stomach is not as bad  Not burning Really rolling however -making a lot of noise   Usually ends up in the hospital with these bouts  Sometimes rest and fluids help   Was able to eat a little hamburger /and sausages   Vomited twice at work this am  A little nausea   Had ccy in the past  No hx of pancreatitis    Tends to go between constipation and diarrhea anyway)   Patient Active Problem List   Diagnosis Date Noted  . SBO (small bowel obstruction) ([place]) 06/26/2017  . Chronic bronchitis ([place]) 06/26/2017  . Dehydration 06/26/2017  . AKI (acute kidney injury) ([place]) 06/26/2017  . Erectile dysfunction 04/11/2017  . Lumbar back sprain 01/28/2017  . Diarrhea 11/06/2016  . History of colonic polyps 12/28/2015  . Small bowel obstruction ([place]) 05/31/2015  . Hepatic steatosis 05/31/2015  . Essential hypertension 07/31/2014  . Abdominal pain 07/31/2014  . ANXIETY 11/14/2009  . DEPENDENT EDEMA 11/09/2008  . Benign neoplasm of  descending colon 10/21/2006  . ADHD (attention deficit hyperactivity disorder) 10/21/2006  . GERD 10/21/2006  . Morbid obesity ([place]) 07/30/2006  . OSA (obstructive sleep apnea) 07/30/2006  . Hemorrhoids 10/03/2005   Past Medical History:  Diagnosis Date  . Asthma   . Bronchitis   . Chronic bronchitis ([place])   . Complication of anesthesia    usually wake up in the middle of procedure or surgery  . Gallstones   . GERD (gastroesophageal reflux disease)   . Hypertension   . Obesity   . Sleep apnea    "suppose to wear mask but I don't" (05/31/2015)   Past Surgical History:  Procedure Laterality Date  . CHOLECYSTECTOMY N/A 02/14/2015   Procedure: LAPAROSCOPIC CHOLECYSTECTOMY WITH INTRAOPERATIVE CHOLANGIOGRAM;  Surgeon: Coralie Keens, MD;  Location: [facility];  Service: General;  Laterality: N/A;  . COLONOSCOPY  10/16/2005   Polyps (tubular adenoma 2007), external hemorrhoids  . COLONOSCOPY WITH PROPOFOL N/A 01/05/2016   Procedure: COLONOSCOPY WITH PROPOFOL;  Surgeon: Milus Banister, MD;  Location: [facility];  Service: Endoscopy;  Laterality: N/A;  . FINGER SURGERY Left    reconstructive - 2 surgeries on hand  . LAPAROSCOPIC GASTRIC SLEEVE RESECTION  10/2010    at [place]  Tobacco Use  . Smoking status: Current Every Day Smoker    Packs/day: 1.00    Years: 20.00    Pack years: 20.00    Types: Cigarettes    Start date: 10/03/2016  . Smokeless tobacco: Current User    Types: Snuff  Substance Use Topics  . Alcohol use: Yes    Alcohol/week: 7.8 oz    Types: 13 Shots of liquor per week    Comment: "a couple of shots a day"  . Drug use: No   Family History  Problem Relation Age of Onset  . Diabetes Mellitus II Mother   . Breast cancer Mother   . Ovarian cancer Mother   . Diabetes Mellitus II Father   . CAD Father   . Stroke Father   . Cancer Father        ?  . Clotting disorder Father        blood clots in brain with surgery  . Cirrhosis Maternal  Grandmother   . Breast cancer Maternal Grandmother    Allergies  Allergen Reactions  . Bee Venom Anaphylaxis  . Codeine Anaphylaxis    Tolerates dilaudid  . Hornet Venom Anaphylaxis  . Penicillins Hives and Other (See Comments)    Has patient had a PCN reaction causing immediate rash, facial/tongue/throat swelling, SOB or lightheadedness with hypotensionYES Has patient had a PCN reaction causing severe rash involving mucus membranes or skin necrosis: NO Has patient had a PCN reaction that required hospitalization NO Has patient had a PCN reaction occurring within the last 10 years: NO If all of the above answers are "NO", then may proceed with Cephalosporin use.   Current Outpatient Medications on File Prior to Visit  Medication Sig Dispense Refill  . albuterol (ACCUNEB) 0.63 MG/3ML nebulizer solution Take 3 mLs (0.63 mg total) by nebulization every 6 (six) hours as needed for wheezing or shortness of breath. 75 mL 3  . albuterol (PROAIR HFA) 108 (90 Base) MCG/ACT inhaler Inhale 2 puffs into the lungs every 6 (six) hours as needed for wheezing or shortness of breath. 1 Inhaler 3  . amLODipine (NORVASC) 5 MG tablet TAKE 1 TABLET BY MOUTH ONCE DAILY 90 tablet 2  . benzonatate (TESSALON) 100 MG capsule Take 1-2 capsules (100-200 mg total) by mouth 3 (three) times daily as needed for cough. 20 capsule 0  . cetirizine (ZYRTEC) 10 MG tablet Take 10 mg by mouth daily.    . nicotine (NICODERM CQ - DOSED IN MG/24 HOURS) 14 mg/24hr patch Place 1 patch (14 mg total) onto the skin daily. 28 patch 0  . omeprazole (PRILOSEC) 20 MG capsule Take 1 capsule (20 mg total) by mouth daily. 90 capsule 1  . ondansetron (ZOFRAN ODT) 4 MG disintegrating tablet Take 1 tablet (4 mg total) by mouth every 8 (eight) hours as needed for nausea or vomiting. 10 tablet 0  . polyethylene glycol (MIRALAX) packet Take 17 g by mouth daily. 30 each 2  . sildenafil (REVATIO) 20 MG tablet Take 2-5 tablets by mouth 30 minutes  prior to intercourse. 50 tablet 0   No current facility-administered medications on file prior to visit.     Review of Systems  Constitutional: Negative for activity change, appetite change, fatigue, fever and unexpected weight change.  HENT: Negative for congestion, rhinorrhea, sore throat and trouble swallowing.   Eyes: Negative for pain, redness, itching and visual disturbance.  Respiratory: Negative for cough, chest tightness, shortness of breath and wheezing.   Cardiovascular: Negative for  chest pain and palpitations.  Gastrointestinal: Positive for abdominal pain, nausea and vomiting. Negative for abdominal distention, anal bleeding, blood in stool, constipation, diarrhea and rectal pain.  Endocrine: Negative for cold intolerance, heat intolerance, polydipsia and polyuria.  Genitourinary: Negative for difficulty urinating, dysuria, frequency and urgency.  Musculoskeletal: Negative for arthralgias, joint swelling and myalgias.  Skin: Negative for pallor and rash.  Neurological: Negative for dizziness, tremors, weakness, numbness and headaches.  Hematological: Negative for adenopathy. Does not bruise/bleed easily.  Psychiatric/Behavioral: Negative for decreased concentration and dysphoric mood. The patient is not nervous/anxious.        Objective:   Physical Exam  Constitutional: He appears well-developed and well-nourished. No distress.  Morbidly obese male   HENT:  Head: Normocephalic and atraumatic.  Mouth/Throat: Oropharynx is clear and moist.  MMM  Eyes: Pupils are equal, round, and reactive to light. Conjunctivae and EOM are normal. No scleral icterus.  Neck: Normal range of motion. Neck supple.  Cardiovascular: Normal rate, regular rhythm and normal heart sounds.  Pulmonary/Chest: Effort normal and breath sounds normal. No respiratory distress. He has no wheezes. He has no rales.  Diffusely distant bs   Abdominal: Soft. Bowel sounds are normal. He exhibits no shifting  dullness, no distension, no pulsatile midline mass and no mass. There is no hepatosplenomegaly. There is generalized tenderness. There is no rigidity, no rebound, no guarding, no CVA tenderness, no tenderness at McBurney's point and negative Murphy's sign.  Mild diffuse abd tenderness w/o rebound or guarding  bs are active-not high pitched or tinkling  Obese abdomen- somewhat difficult to examine  Lymphadenopathy:    He has no cervical adenopathy.  Neurological: He is alert. He exhibits normal muscle tone. Coordination normal.  Skin: Skin is warm and dry. Capillary refill takes less than 2 seconds. No erythema. No pallor.  No jaundice or pallor  Ruddy complexion  No signs of dehydration   Psychiatric: He has a normal mood and affect.          Assessment & Plan:   Problem List Items Addressed This Visit      Cardiovascular and Mediastinum   Essential hypertension    bp high BP: (!) 148/100    May be from discomfort Will need to f/u with pcp        Other   Abdominal pain - Primary    Mild generalized abd pain in morbidly obese pt with hx of recurrent SBO (vomiting resolved/some loose stool)  Ordered abd xray  Labs  Stable/reassuring exam Disc imp of hydration to avoid hospitalization  Bland diet  If worse-go to ED/voiced understanding  Needed work note immed - out today and tomorrow with update       Relevant Orders   CBC with Differential/Platelet   Lipase   Renal function panel   Hepatic function panel   DG Abd 2 Views   Diarrhea    Mild generalized abd pain in morbidly obese pt with hx of recurrent SBO (vomiting resolved/some loose stool)  Does have IBS baseline  Ordered abd xray  Labs  Stable/reassuring exam Disc imp of hydration to avoid hospitalization  Bland diet  If worse-go to ED/voiced understanding  Needed work note immed - out today and tomorrow with update       Relevant Orders   CBC with Differential/Platelet   Lipase   Renal function  panel   Hepatic function panel   DG Abd 2 Views

## 2017-08-21 LAB — HEPATIC FUNCTION PANEL
ALBUMIN: 4.3 g/dL (ref 3.5–5.2)
ALK PHOS: 60 U/L (ref 39–117)
ALT: 25 U/L (ref 0–53)
AST: 17 U/L (ref 0–37)
Bilirubin, Direct: 0.1 mg/dL (ref 0.0–0.3)
TOTAL PROTEIN: 7.2 g/dL (ref 6.0–8.3)
Total Bilirubin: 0.5 mg/dL (ref 0.2–1.2)

## 2017-08-21 LAB — RENAL FUNCTION PANEL
Albumin: 4.3 g/dL (ref 3.5–5.2)
BUN: 12 mg/dL (ref 6–23)
CALCIUM: 9.2 mg/dL (ref 8.4–10.5)
CO2: 28 meq/L (ref 19–32)
CREATININE: 1.09 mg/dL (ref 0.40–1.50)
Chloride: 103 mEq/L (ref 96–112)
GFR: 77.52 mL/min (ref >60.00)
Glucose, Bld: 88 mg/dL (ref 70–99)
Phosphorus: 3.4 mg/dL (ref 2.3–4.6)
Potassium: 4.3 mEq/L (ref 3.5–5.1)
Sodium: 137 mEq/L (ref 135–145)

## 2017-08-21 LAB — CBC WITH DIFFERENTIAL/PLATELET
BASOS ABS: 0.1 10*3/uL (ref 0.0–0.1)
Basophils Relative: 0.8 % (ref 0.0–3.0)
EOS PCT: 1.2 % (ref 0.0–5.0)
Eosinophils Absolute: 0.1 10*3/uL (ref 0.0–0.7)
HCT: 47.2 % (ref 39.0–52.0)
Hemoglobin: 16.5 g/dL (ref 13.0–17.0)
LYMPHS ABS: 1.7 10*3/uL (ref 0.7–4.0)
Lymphocytes Relative: 23.6 % (ref 12.0–46.0)
MCHC: 35 g/dL (ref 30.0–36.0)
MCV: 88.5 fl (ref 78.0–100.0)
MONO ABS: 0.6 10*3/uL (ref 0.1–1.0)
Monocytes Relative: 7.7 % (ref 3.0–12.0)
NEUTROS PCT: 66.7 % (ref 43.0–77.0)
Neutro Abs: 4.8 10*3/uL (ref 1.4–7.7)
Platelets: 213 10*3/uL (ref 150.0–400.0)
RBC: 5.33 Mil/uL (ref 4.22–5.81)
RDW: 14.3 % (ref 11.5–15.5)
WBC: 7.2 10*3/uL (ref 4.0–10.5)

## 2017-08-21 LAB — LIPASE: LIPASE: 35 U/L (ref 11.0–59.0)

## 2017-08-22 ENCOUNTER — Telehealth: Payer: Self-pay | Admitting: Primary Care

## 2017-08-22 NOTE — Telephone Encounter (Signed)
Okay to update his letter for the second missed day, please provide.

## 2017-08-22 NOTE — Telephone Encounter (Signed)
Copied from [place] [id number]. Topic: Quick Communication - See Telephone Encounter >> Aug 22, 2017 11:02 AM Rutherford Nail, NT wrote: CRM for notification. See Telephone encounter for: 08/22/17. Patient calling and states that he took one more day off work. States that he was told that someone would update his note if he needed it. Patient would like to pick up the letter at the office when it is ready. Please advise. CB#:[phone]

## 2017-08-22 NOTE — Telephone Encounter (Signed)
Pt last seen on 08/20/17.

## 2017-08-22 NOTE — Telephone Encounter (Signed)
Printed. Left in the front office for patient.  Spoken to patient and notified that note is ready for pick up.

## 2017-09-04 ENCOUNTER — Encounter: Payer: Self-pay | Admitting: Family Medicine

## 2017-09-04 ENCOUNTER — Ambulatory Visit: Payer: BC Managed Care – PPO | Admitting: Family Medicine

## 2017-09-04 VITALS — BP 152/104 | HR 81 | Temp 97.9°F | Ht 74.0 in | Wt 398.5 lb

## 2017-09-04 DIAGNOSIS — H1033 Unspecified acute conjunctivitis, bilateral: Secondary | ICD-10-CM | POA: Diagnosis not present

## 2017-09-04 DIAGNOSIS — F17200 Nicotine dependence, unspecified, uncomplicated: Secondary | ICD-10-CM | POA: Insufficient documentation

## 2017-09-04 DIAGNOSIS — I10 Essential (primary) hypertension: Secondary | ICD-10-CM | POA: Diagnosis not present

## 2017-09-04 HISTORY — DX: Unspecified acute conjunctivitis, bilateral: H10.33

## 2017-09-04 HISTORY — DX: Nicotine dependence, unspecified, uncomplicated: F17.200

## 2017-09-04 MED ORDER — ERYTHROMYCIN 5 MG/GM OP OINT: 1.0000 "application " | TOPICAL_OINTMENT | Freq: Three times a day (TID) | OPHTHALMIC | 0 refills | Status: DC

## 2017-09-04 MED ORDER — AMLODIPINE BESYLATE 10 MG PO TABS: 10.0000 mg | ORAL_TABLET | Freq: Every day | ORAL | 3 refills | Status: DC

## 2017-09-04 NOTE — Assessment & Plan Note (Signed)
Encouraged smoking cessation 

## 2017-09-04 NOTE — Assessment & Plan Note (Signed)
Viral vs bacterial. Rx erythromycin ointment (pt prefers instead of drops). Encouraged regular use of lubricating eye drops. Handout provided. Discussed contagious nature of infection. 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

## 2017-09-04 NOTE — Progress Notes (Signed)
BP (!) 152/104 (BP Location: Left Arm, Patient Position: Sitting, Cuff Size: Large)   Pulse 81   Temp 97.9 F (36.6 C) (Oral)   Ht 6\' 2"  (1.88 m)   Wt (!) 398 lb 8 oz (180.8 kg)   SpO2 96%   BMI 51.16 kg/m    Visual Acuity Screening   Right eye Left eye Both eyes  Without correction: 20/15 20/20 20/13   With correction:        CC: check eyes Subjective:    Patient ID: [name] [name], male    DOB: 04-Oct-1970, 47 y.o.   MRN: [id number]  HPI: [name] [name] is a 47 y.o. male presenting on 09/04/2017 for Eye Problem (C/o itchy, red and feels like something is in his eyes. Both eyes were matted this morning. Sxs started 2 days ago. )   2d h/o itchy painful red eyes L>R - "sand in eyes". Matting of eyes. + sneezing, rhinorrhea, congestion. Eyes feel swollen. No vision changes, fevers/chills, ST or headache, no pain with eye movements. No h/o allergies. H/o asthma as a child. No sick contacts. Has tried visine redness relief without benefit.   BP elevated today - and last visit. Pt doesn't check BP at home however he states he has had several elevated readings recently.  Current smoker - cutting down to 1/2-1 ppd. Carries smoker's cough.  Relevant past medical, surgical, family and social history reviewed and updated as indicated. Interim medical history since our last visit reviewed. Allergies and medications reviewed and updated. Outpatient Medications Prior to Visit  Medication Sig Dispense Refill  . albuterol (ACCUNEB) 0.63 MG/3ML nebulizer solution Take 3 mLs (0.63 mg total) by nebulization every 6 (six) hours as needed for wheezing or shortness of breath. 75 mL 3  . albuterol (PROAIR HFA) 108 (90 Base) MCG/ACT inhaler Inhale 2 puffs into the lungs every 6 (six) hours as needed for wheezing or shortness of breath. 1 Inhaler 3  . benzonatate (TESSALON) 100 MG capsule Take 1-2 capsules (100-200 mg total) by mouth 3 (three) times daily as needed for cough. 20 capsule 0  .  cetirizine (ZYRTEC) 10 MG tablet Take 10 mg by mouth daily.    . nicotine (NICODERM CQ - DOSED IN MG/24 HOURS) 14 mg/24hr patch Place 1 patch (14 mg total) onto the skin daily. 28 patch 0  . omeprazole (PRILOSEC) 20 MG capsule Take 1 capsule (20 mg total) by mouth daily. 90 capsule 1  . ondansetron (ZOFRAN ODT) 4 MG disintegrating tablet Take 1 tablet (4 mg total) by mouth every 8 (eight) hours as needed for nausea or vomiting. 10 tablet 0  . polyethylene glycol (MIRALAX) packet Take 17 g by mouth daily. 30 each 2  . sildenafil (REVATIO) 20 MG tablet Take 2-5 tablets by mouth 30 minutes prior to intercourse. 50 tablet 0  . amLODipine (NORVASC) 5 MG tablet TAKE 1 TABLET BY MOUTH ONCE DAILY 90 tablet 2   No facility-administered medications prior to visit.      Per HPI unless specifically indicated in ROS section below Review of Systems     Objective:    BP (!) 152/104 (BP Location: Left Arm, Patient Position: Sitting, Cuff Size: Large)   Pulse 81   Temp 97.9 F (36.6 C) (Oral)   Ht 6\' 2"  (1.88 m)   Wt (!) 398 lb 8 oz (180.8 kg)   SpO2 96%   BMI 51.16 kg/m   Wt Readings from Last 3 Encounters:  09/04/17 (!) 398 lb 8  oz (180.8 kg)  08/20/17 (!) 398 lb 12 oz (180.9 kg)  08/17/17 (!) 410 lb (186 kg)    Physical Exam  Constitutional: He appears well-developed and well-nourished. No distress.  HENT:  Head: Normocephalic and atraumatic.  Right Ear: Hearing, tympanic membrane, external ear and ear canal normal.  Left Ear: Hearing, tympanic membrane, external ear and ear canal normal.  Nose: Nose normal. No mucosal edema or rhinorrhea. Right sinus exhibits no maxillary sinus tenderness and no frontal sinus tenderness. Left sinus exhibits no maxillary sinus tenderness and no frontal sinus tenderness.  Mouth/Throat: Uvula is midline, oropharynx is clear and moist and mucous membranes are normal. No oropharyngeal exudate, posterior oropharyngeal edema, posterior oropharyngeal erythema or  tonsillar abscesses.  Eyes: Pupils are equal, round, and reactive to light. EOM are normal. Right eye exhibits no discharge. No foreign body present in the right eye. Left eye exhibits discharge (mild). No foreign body present in the left eye. Right conjunctiva is injected. Left conjunctiva is injected. No scleral icterus.  Palpebral and bulbar injection Photophobia L>R EOMI without pain  Neck: Normal range of motion. Neck supple.  Lymphadenopathy:    He has no cervical adenopathy.  Skin: Skin is warm and dry. No rash noted.  Nursing note and vitals reviewed.     Assessment & Plan:   Problem List Items Addressed This Visit    Smoker    Encouraged smoking cessation      Essential hypertension    bp remains elevated - and pt states has been running high recently - will increase amlodipine to 10mg  daily, encouraged f/u with PCP in next 1-2 months. Discussed monitoring for ankle swelling.       Relevant Medications   amLODipine (NORVASC) 10 MG tablet   Acute conjunctivitis of both eyes - Primary    Viral vs bacterial. Rx erythromycin ointment (pt prefers instead of drops). Encouraged regular use of lubricating eye drops. Handout provided. Discussed contagious nature of infection. 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 amLODipine (NORVASC) 10 MG tablet    Sig: Take 1 tablet (10 mg total) by mouth daily.    Dispense:  30 tablet    Refill:  3  . erythromycin ophthalmic ointment    Sig: Place 1 application into both eyes 3 (three) times daily.    Dispense:  3.5 g    Refill:  0   No orders of the defined types were placed in this encounter.   Follow up plan: Return if symptoms worsen or fail to improve.  Ria Bush, MD

## 2017-09-04 NOTE — Assessment & Plan Note (Addendum)
bp remains elevated - and pt states has been running high recently - will increase amlodipine to 10mg  daily, encouraged f/u with PCP in next 1-2 months. Discussed monitoring for ankle swelling.

## 2017-09-04 NOTE — Patient Instructions (Addendum)
Increase amlodipine to 10mg  daily - and schedule follow up with [name] in next few months for blood pressure follow up.  I do think you have conjunctivitis - this is contagious Treat with antibiotic ointment, and regular lubricating eye drops.  Out of work next 3 days - letter provided today.  Let us know if not improving with treatment.   Bacterial Conjunctivitis Bacterial conjunctivitis is an infection of the clear membrane that covers the white part of your eye and the inner surface of your eyelid (conjunctiva). When the blood vessels in your conjunctiva become inflamed, your eye becomes red or pink, and it will probably feel itchy. Bacterial conjunctivitis spreads very easily from person to person (is contagious). It also spreads easily from one eye to the other eye. What are the causes? This condition is caused by several common bacteria. You may get the infection if you come into close contact with another person who is infected. You may also come into contact with items that are contaminated with the bacteria, such as a face towel, contact lens solution, or eye makeup. What increases the risk? This condition is more likely to develop in people who:  Are exposed to other people who have the infection.  Wear contact lenses.  Have a sinus infection.  Have had a recent eye injury or surgery.  Have a weak body defense system (immune system).  Have a medical condition that causes dry eyes.  What are the signs or symptoms? Symptoms of this condition include:  Eye redness.  Tearing or watery eyes.  Itchy eyes.  Burning feeling in your eyes.  Thick, yellowish discharge from an eye. This may turn into a crust on the eyelid overnight and cause your eyelids to stick together.  Swollen eyelids.  Blurred vision.  How is this diagnosed? Your health care provider can diagnose this condition based on your symptoms and medical history. Your health care provider may also take a sample of  discharge from your eye to find the cause of your infection. This is rarely done. How is this treated? Treatment for this condition includes:  Antibiotic eye drops or ointment to clear the infection more quickly and prevent the spread of infection to others.  Oral antibiotic medicines to treat infections that do not respond to drops or ointments, or last longer than 10 days.  Cool, wet cloths (cool compresses) placed on the eyes.  Artificial tears applied 2-6 times a day.  Follow these instructions at home: Medicines  Take or apply your antibiotic medicine as told by your health care provider. Do not stop taking or applying the antibiotic even if you start to feel better.  Take or apply over-the-counter and prescription medicines only as told by your health care provider.  Be very careful to avoid touching the edge of your eyelid with the eye drop bottle or the ointment tube when you apply medicines to the affected eye. This will keep you from spreading the infection to your other eye or to other people. Managing discomfort  Gently wipe away any drainage from your eye with a warm, wet washcloth or a cotton ball.  Apply a cool, clean washcloth to your eye for 10-20 minutes, 3-4 times a day. General instructions  Do not wear contact lenses until the inflammation is gone and your health care provider says it is safe to wear them again. Ask your health care provider how to sterilize or replace your contact lenses before you use them again. Wear glasses until  you can resume wearing contacts.  Avoid wearing eye makeup until the inflammation is gone. Throw away any old eye cosmetics that may be contaminated.  Change or wash your pillowcase every day.  Do not share towels or washcloths. This may spread the infection.  Wash your hands often with soap and water. Use paper towels to dry your hands.  Avoid touching or rubbing your eyes.  Do not drive or use heavy machinery if your vision  is blurred. Contact a health care provider if:  You have a fever.  Your symptoms do not get better after 10 days. Get help right away if:  You have a fever and your symptoms suddenly get worse.  You have severe pain when you move your eye.  You have facial pain, redness, or swelling.  You have sudden loss of vision. This information is not intended to replace advice given to you by your health care provider. Make sure you discuss any questions you have with your health care provider. Document Released: 02/19/2005 Document Revised: 06/30/2015 Document Reviewed: 12/02/2014 Elsevier Interactive Patient Education  2017 [employer].

## 2017-09-06 ENCOUNTER — Telehealth: Payer: Self-pay

## 2017-09-06 NOTE — Telephone Encounter (Signed)
Printed work note for pt through 09/08/2017 and gave to the pt.

## 2017-09-06 NOTE — Telephone Encounter (Signed)
Copied from [place] [id number]. Topic: General - Other >> Sep 06, 2017  9:23 AM Judyann Munson wrote: Reason for CRM: Patient is needing a doctor note for work until 09/09/17. Stated he is feeling better  just not 100%. He stated his eyes are still sensitive to light. He stated he can come in and pick up the doctors note. Please advise

## 2017-09-06 NOTE — Telephone Encounter (Signed)
Pt was seen on 09/04/17.Please advise.

## 2017-09-06 NOTE — Telephone Encounter (Signed)
Ok to provide work excuse note through the weekend.

## 2017-09-09 ENCOUNTER — Encounter (HOSPITAL_COMMUNITY): Payer: Self-pay | Admitting: Emergency Medicine

## 2017-09-09 ENCOUNTER — Emergency Department (HOSPITAL_COMMUNITY)
Admission: EM | Admit: 2017-09-09 | Discharge: 2017-09-09 | Disposition: A | Discharge status: Home / Self Care | Payer: No Typology Code available for payment source | Attending: Emergency Medicine | Admitting: Emergency Medicine

## 2017-09-09 ENCOUNTER — Other Ambulatory Visit: Payer: Self-pay

## 2017-09-09 DIAGNOSIS — Z79899 Other long term (current) drug therapy: Secondary | ICD-10-CM | POA: Diagnosis not present

## 2017-09-09 DIAGNOSIS — Z23 Encounter for immunization: Secondary | ICD-10-CM | POA: Diagnosis not present

## 2017-09-09 DIAGNOSIS — J45909 Unspecified asthma, uncomplicated: Secondary | ICD-10-CM | POA: Insufficient documentation

## 2017-09-09 DIAGNOSIS — F17210 Nicotine dependence, cigarettes, uncomplicated: Secondary | ICD-10-CM | POA: Insufficient documentation

## 2017-09-09 DIAGNOSIS — I10 Essential (primary) hypertension: Secondary | ICD-10-CM | POA: Diagnosis not present

## 2017-09-09 DIAGNOSIS — Z7721 Contact with and (suspected) exposure to potentially hazardous body fluids: Secondary | ICD-10-CM | POA: Diagnosis not present

## 2017-09-09 DIAGNOSIS — W461XXA Contact with contaminated hypodermic needle, initial encounter: Secondary | ICD-10-CM

## 2017-09-09 LAB — RAPID HIV SCREEN (HIV 1/2 AB+AG)
HIV 1/2 Antibodies: NONREACTIVE
HIV-1 P24 Antigen - HIV24: NONREACTIVE

## 2017-09-09 MED ORDER — DOXYCYCLINE HYCLATE 100 MG PO TABS
100.0000 mg | ORAL_TABLET | Freq: Once | ORAL | Status: AC
  Administered 2017-09-09: 100 mg via ORAL
  Filled 2017-09-09: qty 1

## 2017-09-09 MED ORDER — DOXYCYCLINE HYCLATE 100 MG PO CAPS: 100.0000 mg | ORAL_CAPSULE | Freq: Two times a day (BID) | ORAL | 0 refills | Status: DC

## 2017-09-09 MED ORDER — TETANUS-DIPHTH-ACELL PERTUSSIS 5-2.5-18.5 LF-MCG/0.5 IM SUSP
0.5000 mL | Freq: Once | INTRAMUSCULAR | Status: AC
  Administered 2017-09-09: 0.5 mL via INTRAMUSCULAR
  Filled 2017-09-09: qty 0.5

## 2017-09-09 NOTE — Discharge Instructions (Addendum)
Please cleanse the right hand daily with soap and water.  Please observe for any signs of red streaks going up the hand, pus like drainage, or other signs of infection.  Please use doxycycline 2 times daily with food.  Your tetanus status was updated today, please make this a part of your medical records.  Your rapid HIV test was nonreactive.  The hepatitis B and C tests will be available in 1 to 2 days.  If there is an abnormality, someone from our flow managers office will call you with necessary instructions.

## 2017-09-09 NOTE — ED Provider Notes (Signed)
[facility] EMERGENCY DEPARTMENT Provider Note   CSN: [id number] Arrival date & time: 09/09/17  1550     History   Chief Complaint Chief Complaint  Patient presents with  . Body Fluid Exposure    HPI [name] [name] is a 47 y.o. male.  Patient is a 47 year old male who presents to the emergency department following a needlestick to the hand.  The patient works with the [employer].  He says someone confiscated a tattoo needle and put it in a drawer and a glove.  When he went to get a napkin or out of the drawer his finger was struck by the needle.  He says that it barely penetrated the skin.  He says he did not think he even got 1 drop of blood from the needlestick area.  He is unsure of how long the needle has been in the drawer.  The patient is also unsure of home the needle was confiscated from.  Patient thinks that his tetanus is up-to-date but he is not sure.  He has no history of hepatitis C HIV or any other transmitted problems.     Past Medical History:  Diagnosis Date  . Asthma   . Bronchitis   . Chronic bronchitis ([place])   . Complication of anesthesia    usually wake up in the middle of procedure or surgery  . Gallstones   . GERD (gastroesophageal reflux disease)   . Hypertension   . Obesity   . Sleep apnea    "suppose to wear mask but I don't" (05/31/2015)    Patient Active Problem List   Diagnosis Date Noted  . Acute conjunctivitis of both eyes 09/04/2017  . Smoker 09/04/2017  . SBO (small bowel obstruction) ([place]) 06/26/2017  . Chronic bronchitis ([place]) 06/26/2017  . Dehydration 06/26/2017  . AKI (acute kidney injury) ([place]) 06/26/2017  . Erectile dysfunction 04/11/2017  . Lumbar back sprain 01/28/2017  . Diarrhea 11/06/2016  . History of colonic polyps 12/28/2015  . Small bowel obstruction ([place]) 05/31/2015  . Hepatic steatosis 05/31/2015  . Essential hypertension 07/31/2014  . Abdominal pain 07/31/2014  . ANXIETY 11/14/2009    . DEPENDENT EDEMA 11/09/2008  . 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  . ADHD (attention deficit hyperactivity disorder) 10/21/2006  . GERD 10/21/2006  . Morbid obesity ([place]) 07/30/2006  . OSA (obstructive sleep apnea) 07/30/2006  . Hemorrhoids 10/03/2005    Past Surgical History:  Procedure Laterality Date  . CHOLECYSTECTOMY N/A 02/14/2015   Procedure: LAPAROSCOPIC CHOLECYSTECTOMY WITH INTRAOPERATIVE CHOLANGIOGRAM;  Surgeon: Coralie Keens, MD;  Location: [facility];  Service: General;  Laterality: N/A;  . COLONOSCOPY  10/16/2005   Polyps (tubular adenoma 2007), external hemorrhoids  . COLONOSCOPY WITH PROPOFOL N/A 01/05/2016   Procedure: COLONOSCOPY WITH PROPOFOL;  Surgeon: Milus Banister, MD;  Location: [facility] END[facility]COPY;  Service: Endoscopy;  Laterality: N/A;  . FINGER SURGERY Left    reconstructive - 2 surgeries on hand  . LAPAROSCOPIC GASTRIC SLEEVE RESECTION  10/2010    at [facility] Medications    Prior to Admission medications   Medication Sig Start Date End Date Taking? Authorizing Provider  albuterol (ACCUNEB) 0.63 MG/3ML nebulizer solution Take 3 mLs (0.63 mg total) by nebulization every 6 (six) hours as needed for wheezing or shortness of breath. 05/10/17   Elby Beck, FNP  albuterol (PROAIR HFA) 108 (90 Base) MCG/ACT inhaler Inhale 2 puffs into the lungs every 6 (six) hours as  needed for wheezing or shortness of breath. 08/28/16   Copland, Frederico Hamman, MD  amLODipine (NORVASC) 10 MG tablet Take 1 tablet (10 mg total) by mouth daily. 09/04/17   Ria Bush, MD  benzonatate (TESSALON) 100 MG capsule Take 1-2 capsules (100-200 mg total) by mouth 3 (three) times daily as needed for cough. 05/10/17   Elby Beck, FNP  cetirizine (ZYRTEC) 10 MG tablet Take 10 mg by mouth daily.    [provider]  erythromycin ophthalmic ointment Place 1 application into both eyes 3 (three) times daily. 09/04/17   Ria Bush, MD  nicotine (NICODERM CQ -  DOSED IN MG/24 HOURS) 14 mg/24hr patch Place 1 patch (14 mg total) onto the skin daily. 06/30/17   Roxan Hockey, MD  omeprazole (PRILOSEC) 20 MG capsule Take 1 capsule (20 mg total) by mouth daily. 04/11/17   [name], NP  ondansetron (ZOFRAN ODT) 4 MG disintegrating tablet Take 1 tablet (4 mg total) by mouth every 8 (eight) hours as needed for nausea or vomiting. 06/29/17   Roxan Hockey, MD  polyethylene glycol (MIRALAX) packet Take 17 g by mouth daily. 06/29/17   Roxan Hockey, MD  sildenafil (REVATIO) 20 MG tablet Take 2-5 tablets by mouth 30 minutes prior to intercourse. 04/11/17   [name], NP    Family History Family History  Problem Relation Age of Onset  . Diabetes Mellitus II Mother   . Breast cancer Mother   . Ovarian cancer Mother   . Diabetes Mellitus II Father   . CAD Father   . Stroke Father   . Cancer Father        ?  . Clotting disorder Father        blood clots in brain with surgery  . Cirrhosis Maternal Grandmother   . Breast cancer Maternal Grandmother     Social History Social History   Tobacco Use  . Smoking status: Current Every Day Smoker    Packs/day: 1.00    Years: 20.00    Pack years: 20.00    Types: Cigarettes    Start date: 10/03/2016  . Smokeless tobacco: Current User    Types: Snuff  Substance Use Topics  . Alcohol use: Yes    Alcohol/week: 7.8 oz    Types: 13 Shots of liquor per week    Comment: "a couple of shots a day"  . Drug use: No     Allergies   Bee venom; Codeine; Hornet venom; and Penicillins   Review of Systems Review of Systems  Constitutional: Negative for activity change.       All ROS Neg except as noted in HPI  HENT: Negative for nosebleeds.   Eyes: Negative for photophobia and discharge.  Respiratory: Negative for cough, shortness of breath and wheezing.   Cardiovascular: Negative for chest pain and palpitations.  Gastrointestinal: Negative for abdominal pain and blood in stool.   Genitourinary: Negative for dysuria, frequency and hematuria.  Musculoskeletal: Negative for arthralgias, back pain and neck pain.  Skin: Negative.   Neurological: Negative for dizziness, seizures and speech difficulty.  Psychiatric/Behavioral: Negative for confusion and hallucinations.     Physical Exam Updated Vital Signs BP (!) 144/94 (BP Location: Right Arm)   Pulse 91   Temp 98 F (36.7 C) (Oral)   Resp 18   Ht 6\' 2"  (1.88 m)   Wt (!) 179.2 kg (395 lb)   SpO2 98%   BMI 50.71 kg/m   Physical Exam  Constitutional: He is  oriented to person, place, and time. He appears well-developed and well-nourished.  Non-toxic appearance.  HENT:  Head: Normocephalic.  Right Ear: Tympanic membrane and external ear normal.  Left Ear: Tympanic membrane and external ear normal.  Eyes: Pupils are equal, round, and reactive to light. EOM and lids are normal.  Neck: Normal range of motion. Neck supple. Carotid bruit is not present.  Cardiovascular: Normal rate, regular rhythm, normal heart sounds, intact distal pulses and normal pulses.  Pulmonary/Chest: Breath sounds normal. No respiratory distress.  Abdominal: Soft. Bowel sounds are normal. There is no tenderness. There is no guarding.  Musculoskeletal: Normal range of motion.  Patient reports a puncture wound with a needle to the distal medial aspect of the right index finger.  There is no bleeding at this time.  The area has sealed and the site of the puncture cannot be found at this time.  The patient has full range of motion of the finger.  No other puncture wounds or wounds of any kind noted of the right hand.  Lymphadenopathy:       Head (right side): No submandibular adenopathy present.       Head (left side): No submandibular adenopathy present.    He has no cervical adenopathy.  Neurological: He is alert and oriented to person, place, and time. He has normal strength. No cranial nerve deficit or sensory deficit.  There is full range  of motion of the right upper extremity.  There are no motor or sensory deficits of the fingers of the right hand.  Skin: Skin is warm and dry.  Psychiatric: He has a normal mood and affect. His speech is normal.  Nursing note and vitals reviewed.    ED Treatments / Results  Labs (all labs ordered are listed, but only abnormal results are displayed) Labs Reviewed  RAPID HIV SCREEN (HIV 1/2 AB+AG)  HEPATITIS B SURFACE ANTIGEN  HEPATITIS C ANTIBODY (REFLEX)    EKG None  Radiology No results found.  Procedures Procedures (including critical care time)  Medications Ordered in ED Medications - No data to display   Initial Impression / Assessment and Plan / ED Course  I have reviewed the triage vital signs and the nursing notes.  Pertinent labs & imaging results that were available during my care of the patient were reviewed by me and considered in my medical decision making (see chart for details).       Final Clinical Impressions(s) / ED Diagnoses MDM  Patient received a needlestick from a homemade tattoo needle at his workplace.  It is unknown how long the needle had been in the drawer, and it is also unknown from whom the needle was confiscated.  The patient's tetanus status was updated today.  The patient was treated with doxycycline to cover for the puncture wound.  The patient had bodily fluid exposure labs.  The rapid HIV was negative.  The hepatitis B and C are pending.  I have explained to the patient that if there were any abnormalities that someone from the flow managers office would notify him of needed management changes.  Questions were answered.  Feel that it is safe for the patient to be discharged home.   Final diagnoses:  Exposure to body fluid due to accidental needlestick injury    ED Discharge Orders        Ordered    doxycycline (VIBRAMYCIN) 100 MG capsule  2 times daily     09/09/17 1825  Lily Kocher, PA-C 09/09/17 Velta Addison    Nat Christen, MD 09/10/17 [phone]

## 2017-09-09 NOTE — ED Triage Notes (Addendum)
Pt states he was stuck with a tattoo needle and was told to come to ED. PT works for [employer] and states the sharp object was in a desk drawer and was taken away from an inmate and he is unsure that it was in the drawer of the desk.

## 2017-09-10 LAB — HEPATITIS B SURFACE ANTIGEN: Hepatitis B Surface Ag: NEGATIVE

## 2017-09-10 LAB — HCV COMMENT:

## 2017-09-10 LAB — HEPATITIS C ANTIBODY (REFLEX)

## 2017-11-25 ENCOUNTER — Other Ambulatory Visit: Payer: Self-pay | Admitting: Primary Care

## 2017-11-25 DIAGNOSIS — K219 Gastro-esophageal reflux disease without esophagitis: Secondary | ICD-10-CM

## 2017-12-30 ENCOUNTER — Encounter: Payer: Self-pay | Admitting: Emergency Medicine

## 2017-12-30 ENCOUNTER — Encounter: Payer: Self-pay | Admitting: Family Medicine

## 2017-12-30 ENCOUNTER — Encounter (INDEPENDENT_AMBULATORY_CARE_PROVIDER_SITE_OTHER): Payer: Self-pay

## 2017-12-30 ENCOUNTER — Ambulatory Visit (INDEPENDENT_AMBULATORY_CARE_PROVIDER_SITE_OTHER)
Admission: RE | Admit: 2017-12-30 | Discharge: 2017-12-30 | Disposition: A | Discharge status: Home / Self Care | Payer: BC Managed Care – PPO | Source: Ambulatory Visit | Attending: Family Medicine | Admitting: Family Medicine

## 2017-12-30 ENCOUNTER — Ambulatory Visit: Payer: BC Managed Care – PPO | Admitting: Family Medicine

## 2017-12-30 VITALS — BP 148/94 | HR 96 | Temp 98.2°F | Ht 74.0 in | Wt >= 6400 oz

## 2017-12-30 DIAGNOSIS — R062 Wheezing: Secondary | ICD-10-CM

## 2017-12-30 DIAGNOSIS — R05 Cough: Secondary | ICD-10-CM

## 2017-12-30 DIAGNOSIS — J22 Unspecified acute lower respiratory infection: Secondary | ICD-10-CM | POA: Diagnosis not present

## 2017-12-30 DIAGNOSIS — J42 Unspecified chronic bronchitis: Secondary | ICD-10-CM

## 2017-12-30 DIAGNOSIS — R059 Cough, unspecified: Secondary | ICD-10-CM

## 2017-12-30 DIAGNOSIS — Z720 Tobacco use: Secondary | ICD-10-CM

## 2017-12-30 MED ORDER — IPRATROPIUM-ALBUTEROL 0.5-2.5 (3) MG/3ML IN SOLN
3.0000 mL | Freq: Once | RESPIRATORY_TRACT | Status: AC
  Administered 2017-12-30: 3 mL via RESPIRATORY_TRACT

## 2017-12-30 MED ORDER — HYDROCODONE-HOMATROPINE 5-1.5 MG/5ML PO SYRP: 5.0000 mL | ORAL_SOLUTION | Freq: Three times a day (TID) | ORAL | 0 refills | Status: AC | PRN

## 2017-12-30 MED ORDER — PREDNISONE 20 MG PO TABS: 40.0000 mg | ORAL_TABLET | Freq: Every day | ORAL | 0 refills | Status: AC

## 2017-12-30 MED ORDER — DOXYCYCLINE HYCLATE 100 MG PO CAPS: 100.0000 mg | ORAL_CAPSULE | Freq: Two times a day (BID) | ORAL | 0 refills | Status: DC

## 2017-12-30 NOTE — Patient Instructions (Signed)
I have sent an antibiotic, cough medicine and prednisone to your pharmacy.  Please continue your nebulizer treatments every 4-6 hours as needed for cough/wheeze Follow up if not better in 3-4 days We will call you with xray results Follow up with [name], NP in 2-4 weeks for high blood pressure  Community-Acquired Pneumonia, Adult Pneumonia is an infection of the lungs. One type of pneumonia can happen while a person is in a hospital. A different type can happen when a person is not in a hospital (community-acquired pneumonia). It is easy for this kind to spread from person to person. It can spread to you if you breathe near an infected person who coughs or sneezes. Some symptoms include:  A dry cough.  A wet (productive) cough.  Fever.  Sweating.  Chest pain.  Follow these instructions at home:  Take over-the-counter and prescription medicines only as told by your doctor. ? Only take cough medicine if you are losing sleep. ? If you were prescribed an antibiotic medicine, take it as told by your doctor. Do not stop taking the antibiotic even if you start to feel better.  Sleep with your head and neck raised (elevated). You can do this by putting a few pillows under your head, or you can sleep in a recliner.  Do not use tobacco products. These include cigarettes, chewing tobacco, and e-cigarettes. If you need help quitting, ask your doctor.  Drink enough water to keep your pee (urine) clear or pale yellow. A shot (vaccine) can help prevent pneumonia. Shots are often suggested for:  People older than 46 years of age.  People older than 46 years of age: ? Who are having cancer treatment. ? Who have long-term (chronic) lung disease. ? Who have problems with their body's defense system (immune system).  You may also prevent pneumonia if you take these actions:  Get the flu (influenza) shot every year.  Go to the dentist as often as told.  Wash your hands often. If soap and  water are not available, use hand sanitizer.  Contact a doctor if:  You have a fever.  You lose sleep because your cough medicine does not help. Get help right away if:  You are short of breath and it gets worse.  You have more chest pain.  Your sickness gets worse. This is very serious if: ? You are an older adult. ? Your body's defense system is weak.  You cough up blood. This information is not intended to replace advice given to you by your health care provider. Make sure you discuss any questions you have with your health care provider. Document Released: 08/08/2007 Document Revised: 07/28/2015 Document Reviewed: 06/16/2014 Elsevier Interactive Patient Education  [employer].

## 2017-12-30 NOTE — Progress Notes (Signed)
Subjective:    Patient ID: [name] [name], male    DOB: 01/30/1971, 47 y.o.   MRN: [id number]  HPI This is a 47 yo male who presents today with 7 days of cough, head congestion, chest congestion. Cough productive of white phlegm, same from nose. Some post nasal drainage. Fatigued. Mid back pain. Some SOB, wheezing. Using albuterol nebulizer 4-5 times a day. Works as Curator. Has taken mucinex, tylenol cold/sinus. Has been taking left over antibiotic 4 days of TID clindamycin without improvement. No myalgias. Smokes 1/2 ppd. Daughter with similar symptoms.  Takes oxycodone 1-2 every 3 weeks for his knee.  He works as a Curator and has been out of work for 3 months to take care of his mother who has cancer. "I fight for a living." He is supposed to return to work tomorrow. He does not feel that he is able to safely work with his current symptoms.    Past Medical History:  Diagnosis Date  . Asthma   . Bronchitis   . Chronic bronchitis ([place])   . Complication of anesthesia    usually wake up in the middle of procedure or surgery  . Gallstones   . GERD (gastroesophageal reflux disease)   . Hypertension   . Obesity   . Sleep apnea    "suppose to wear mask but I don't" (05/31/2015)   Past Surgical History:  Procedure Laterality Date  . CHOLECYSTECTOMY N/A 02/14/2015   Procedure: LAPAROSCOPIC CHOLECYSTECTOMY WITH INTRAOPERATIVE CHOLANGIOGRAM;  Surgeon: Coralie Keens, MD;  Location: [facility];  Service: General;  Laterality: N/A;  . COLONOSCOPY  10/16/2005   Polyps (tubular adenoma 2007), external hemorrhoids  . COLONOSCOPY WITH PROPOFOL N/A 01/05/2016   Procedure: COLONOSCOPY WITH PROPOFOL;  Surgeon: Milus Banister, MD;  Location: [facility];  Service: Endoscopy;  Laterality: N/A;  . FINGER SURGERY Left    reconstructive - 2 surgeries on hand  . LAPAROSCOPIC GASTRIC SLEEVE RESECTION  10/2010    at [facility] History  Problem Relation Age of Onset  .  Diabetes Mellitus II Mother   . Breast cancer Mother   . Ovarian cancer Mother   . Diabetes Mellitus II Father   . CAD Father   . Stroke Father   . Cancer Father        ?  . Clotting disorder Father        blood clots in brain with surgery  . Cirrhosis Maternal Grandmother   . Breast cancer Maternal Grandmother    Social History   Tobacco Use  . Smoking status: Current Every Day Smoker    Packs/day: 1.00    Years: 20.00    Pack years: 20.00    Types: Cigarettes    Start date: 10/03/2016  . Smokeless tobacco: Current User    Types: Snuff  Substance Use Topics  . Alcohol use: Yes    Alcohol/week: 13.0 standard drinks    Types: 13 Shots of liquor per week    Comment: "a couple of shots a day"  . Drug use: No      Review of Systems Per HPI    Objective:   Physical Exam  Constitutional: He is oriented to person, place, and time. He appears well-developed and well-nourished. No distress.  HENT:  Head: Normocephalic and atraumatic.  Right Ear: External ear normal.  Left Ear: External ear normal.  Nose: Mucosal edema and rhinorrhea present.  Mouth/Throat: Uvula is midline and oropharynx is clear and moist.  Eyes: Conjunctivae are normal.  Neck: Normal range of motion. Neck supple.  Cardiovascular: Normal rate, regular rhythm and normal heart sounds.  Pulmonary/Chest: Effort normal. No stridor. No respiratory distress. He has wheezes. He has no rales.  Frequent non productive cough.   Lymphadenopathy:    He has no cervical adenopathy.  Neurological: He is alert and oriented to person, place, and time.  Skin: Skin is warm and dry. He is not diaphoretic.  Psychiatric: He has a normal mood and affect. His behavior is normal. Judgment and thought content normal.  Vitals reviewed.     BP (!) 148/94   Pulse 96   Temp 98.2 F (36.8 C)   Ht 6\' 2"  (1.88 m)   Wt (!) 410 lb (186 kg)   SpO2 97%   BMI 52.64 kg/m  Wt Readings from Last 3 Encounters:  12/30/17 (!) 410 lb  (186 kg)  09/09/17 (!) 395 lb (179.2 kg)  09/04/17 (!) 398 lb 8 oz (180.8 kg)   BP Readings from Last 3 Encounters:  12/30/17 (!) 148/94  09/09/17 (!) 144/94  09/04/17 (!) 152/104     Given duo neb treatment in office with some improvement in lung sounds and subjective improvement in cough/chest tightness    Assessment & Plan:  1. Wheeze - ipratropium-albuterol (DUONEB) 0.5-2.5 (3) MG/3ML nebulizer solution 3 mL - predniSONE (DELTASONE) 20 MG tablet; Take 2 tablets (40 mg total) by mouth daily with breakfast for 5 days.  Dispense: 10 tablet; Refill: 0 - DG Chest 2 View; Future  2. Cough - ipratropium-albuterol (DUONEB) 0.5-2.5 (3) MG/3ML nebulizer solution 3 mL - HYDROcodone-homatropine (HYCODAN) 5-1.5 MG/5ML syrup; Take 5 mLs by mouth every 8 (eight) hours as needed for up to 5 days for cough.  Dispense: 75 mL; Refill: 0 - DG Chest 2 View; Future  3. Lower respiratory infection - Provided written and verbal information regarding diagnosis and treatment. - RTC precautions reviewed - doxycycline (VIBRAMYCIN) 100 MG capsule; Take 1 capsule (100 mg total) by mouth 2 (two) times daily.  Dispense: 14 capsule; Refill: 0 - predniSONE (DELTASONE) 20 MG tablet; Take 2 tablets (40 mg total) by mouth daily with breakfast for 5 days.  Dispense: 10 tablet; Refill: 0 - HYDROcodone-homatropine (HYCODAN) 5-1.5 MG/5ML syrup; Take 5 mLs by mouth every 8 (eight) hours as needed for up to 5 days for cough.  Dispense: 75 mL; Refill: 0 - DG Chest 2 View; Future  4. Chronic bronchitis, unspecified chronic bronchitis type ([place]) - discussed diagnosis and encouraged him to contemplate smoking cessation  5. Tobacco abuse - see #4   Clarene Reamer, FNP-BC  [facility]  12/30/2017 7:39 PM

## 2018-01-02 ENCOUNTER — Telehealth: Payer: Self-pay | Admitting: Family Medicine

## 2018-01-02 NOTE — Telephone Encounter (Signed)
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 Please advise pt when ready so he can pick it up.

## 2018-01-03 NOTE — Telephone Encounter (Signed)
Ok to extend out of work note, please write, call patient and leave it for him

## 2018-01-03 NOTE — Telephone Encounter (Addendum)
Printed work note, per [employer].  Spoke with pt notifying him the note is ready to pick up. Verbalizes understanding and expresses his thanks.   [Placed work note at front office.]

## 2018-01-03 NOTE — Telephone Encounter (Signed)
Pt called back checking on work note Note not up front  Best number [phone]

## 2018-03-10 ENCOUNTER — Telehealth: Payer: Self-pay | Admitting: Primary Care

## 2018-03-10 ENCOUNTER — Encounter: Payer: Self-pay | Admitting: Family Medicine

## 2018-03-10 ENCOUNTER — Ambulatory Visit: Payer: BC Managed Care – PPO | Admitting: Family Medicine

## 2018-03-10 VITALS — BP 150/94 | HR 84 | Temp 98.2°F | Ht 74.0 in | Wt >= 6400 oz

## 2018-03-10 DIAGNOSIS — J069 Acute upper respiratory infection, unspecified: Secondary | ICD-10-CM

## 2018-03-10 DIAGNOSIS — K219 Gastro-esophageal reflux disease without esophagitis: Secondary | ICD-10-CM | POA: Diagnosis not present

## 2018-03-10 DIAGNOSIS — I10 Essential (primary) hypertension: Secondary | ICD-10-CM

## 2018-03-10 MED ORDER — AMLODIPINE BESYLATE 10 MG PO TABS: 10.0000 mg | ORAL_TABLET | Freq: Every day | ORAL | 0 refills | Status: DC

## 2018-03-10 MED ORDER — OMEPRAZOLE 20 MG PO CPDR: 20.0000 mg | DELAYED_RELEASE_CAPSULE | Freq: Every day | ORAL | 0 refills | Status: DC

## 2018-03-10 NOTE — Assessment & Plan Note (Signed)
Medication refilled. Advised follow-up to discuss persistently elevated BP with PCP.

## 2018-03-10 NOTE — Telephone Encounter (Signed)
-----   Message from Lesleigh Noe, MD sent at 03/10/2018  3:29 PM EST ----- FYI - I thought about adding a medication but also thought it was important that he see you. He said he was out of oxycodone so hopefully he will be in soon. I gave him a 3 month supple and no refills so that he scheduled with you.

## 2018-03-10 NOTE — Assessment & Plan Note (Signed)
Noted weight gain of 20lbs, which pt was unaware of. Advised moving more and follow-up with PCP.

## 2018-03-10 NOTE — Progress Notes (Signed)
Subjective:     [name] [name] is a 48 y.o. male presenting for Generalized Body Aches (Symptoms started on 03/04/18. Body aches, diarrhea, cough-dry, back pain. Feels like he has had fever at night. has taking Tylenol sinus or Tylenol cold.)     URI   This is a new problem. The current episode started in the past 7 days. The problem has been gradually improving. There has been no fever. Associated symptoms include abdominal pain, coughing, diarrhea, headaches and sneezing. Pertinent negatives include no chest pain, congestion, ear pain, nausea, plugged ear sensation, rhinorrhea, sinus pain, sore throat or vomiting. He has tried decongestant and acetaminophen for the symptoms. The treatment provided mild relief.    Hit extremely hard on day one  Hx of SBO S/p Bariatric sleeve 2011   Review of Systems  HENT: Positive for sneezing. Negative for congestion, ear pain, rhinorrhea, sinus pain and sore throat.   Respiratory: Positive for cough.   Cardiovascular: Negative for chest pain.  Gastrointestinal: Positive for abdominal pain and diarrhea. Negative for nausea and vomiting.  Musculoskeletal: Positive for arthralgias, back pain and myalgias.  Neurological: Positive for headaches.    12/20/2017: Clinic - cough - duoneb, prednisone, doxycycline, hydrocodone-homatropine  Social History   Tobacco Use  Smoking Status Former Smoker  . Packs/day: 1.00  . Years: 20.00  . Pack years: 20.00  . Types: Cigarettes  . Start date: 10/03/2016  . Last attempt to quit: 10/03/2017  . Years since quitting: 0.4  Smokeless Tobacco Current User  . Types: Snuff        Objective:    BP Readings from Last 3 Encounters:  03/10/18 (!) 150/94  12/30/17 (!) 148/94  09/09/17 (!) 144/94   Wt Readings from Last 3 Encounters:  03/10/18 (!) 416 lb 8 oz (188.9 kg)  12/30/17 (!) 410 lb (186 kg)  09/09/17 (!) 395 lb (179.2 kg)    BP (!) 150/94   Pulse 84   Temp 98.2 F (36.8 C)   Ht 6\' 2"   (1.88 m)   Wt (!) 416 lb 8 oz (188.9 kg)   SpO2 97%   BMI 53.48 kg/m    Physical Exam Constitutional:      General: He is not in acute distress.    Appearance: He is well-developed. He is not ill-appearing.  HENT:     Head: Normocephalic and atraumatic.     Right Ear: Tympanic membrane and ear canal normal.     Left Ear: Tympanic membrane and ear canal normal.     Nose: Mucosal edema and rhinorrhea present.     Right Sinus: No maxillary sinus tenderness or frontal sinus tenderness.     Left Sinus: No maxillary sinus tenderness or frontal sinus tenderness.     Mouth/Throat:     Pharynx: Uvula midline. Posterior oropharyngeal erythema present. No oropharyngeal exudate.     Tonsils: Swelling: 0 on the right. 0 on the left.  Neck:     Musculoskeletal: Neck supple.  Cardiovascular:     Rate and Rhythm: Normal rate and regular rhythm.     Heart sounds: No murmur.  Pulmonary:     Effort: Pulmonary effort is normal. No respiratory distress.     Breath sounds: Normal breath sounds.  Abdominal:     General: Bowel sounds are normal. There is no distension.     Tenderness: There is no abdominal tenderness. There is no guarding or rebound.  Lymphadenopathy:     Cervical: No cervical adenopathy.  Skin:  General: Skin is warm and dry.     Capillary Refill: Capillary refill takes less than 2 seconds.  Neurological:     Mental Status: He is alert.           Assessment & Plan:   Problem List Items Addressed This Visit      Cardiovascular and Mediastinum   Essential hypertension    Medication refilled. Advised follow-up to discuss persistently elevated BP with PCP.       Relevant Medications   amLODipine (NORVASC) 10 MG tablet     Digestive   GERD   Relevant Medications   omeprazole (PRILOSEC) 20 MG capsule     Other   Morbid obesity ([place])    Noted weight gain of 20lbs, which pt was unaware of. Advised moving more and follow-up with PCP.        Other Visit  Diagnoses    Upper respiratory tract infection, unspecified type    -  Primary     URI suspect and discussed that given symptom onset may have been the Flu.  Continue symptomatic care  Return in about 4 weeks (around 04/07/2018) for BP check and med refill .  Lesleigh Noe, MD

## 2018-03-10 NOTE — Patient Instructions (Addendum)
Based on your symptoms, it looks like you have a virus.   Antibiotics are not need for a viral infection but the following will help:   1. Drink plenty of fluids 2. Get lots of rest  Sinus Congestion 1) Neti Pot (Saline rinse) -- 2 times day -- if tolerated 2) Flonase (Store Brand ok) - once daily 3) Over the counter congestion medications  Cough 1) Cough drops can be helpful 2) Nyquil (or nighttime cough medication) 3) Honey is proven to be one of the best cough medications     Try to start moving and do some gentle stretching to help with the back pain  If you develop fevers (Temperature >100.4), chills, worsening symptoms or symptoms lasting longer than 10 days return to clinic.

## 2018-03-10 NOTE — Telephone Encounter (Signed)
[name] [name], will you please have patient scheduled with me for BP and other follow up in 4 weeks? He really needs CPE if he's willing.

## 2018-03-13 ENCOUNTER — Emergency Department (HOSPITAL_COMMUNITY)
Admission: EM | Admit: 2018-03-13 | Discharge: 2018-03-13 | Disposition: A | Discharge status: Home / Self Care | Payer: BC Managed Care – PPO | Attending: Emergency Medicine | Admitting: Emergency Medicine

## 2018-03-13 ENCOUNTER — Emergency Department (HOSPITAL_COMMUNITY): Payer: BC Managed Care – PPO

## 2018-03-13 ENCOUNTER — Other Ambulatory Visit: Payer: Self-pay

## 2018-03-13 ENCOUNTER — Encounter (HOSPITAL_COMMUNITY): Payer: Self-pay

## 2018-03-13 DIAGNOSIS — F17290 Nicotine dependence, other tobacco product, uncomplicated: Secondary | ICD-10-CM | POA: Diagnosis not present

## 2018-03-13 DIAGNOSIS — Z79899 Other long term (current) drug therapy: Secondary | ICD-10-CM | POA: Diagnosis not present

## 2018-03-13 DIAGNOSIS — I10 Essential (primary) hypertension: Secondary | ICD-10-CM | POA: Diagnosis not present

## 2018-03-13 DIAGNOSIS — R0789 Other chest pain: Secondary | ICD-10-CM | POA: Insufficient documentation

## 2018-03-13 LAB — CBC
HCT: 46.5 % (ref 39.0–52.0)
Hemoglobin: 16 g/dL (ref 13.0–17.0)
MCH: 30.5 pg (ref 26.0–34.0)
MCHC: 34.4 g/dL (ref 30.0–36.0)
MCV: 88.6 fL (ref 80.0–100.0)
Platelets: 213 10*3/uL (ref 150–400)
RBC: 5.25 MIL/uL (ref 4.22–5.81)
RDW: 12.2 % (ref 11.5–15.5)
WBC: 5.2 10*3/uL (ref 4.0–10.5)
nRBC: 0 % (ref 0.0–0.2)

## 2018-03-13 LAB — ETHANOL: Alcohol, Ethyl (B): 62 mg/dL — ABNORMAL HIGH (ref <10)

## 2018-03-13 LAB — BASIC METABOLIC PANEL
Anion gap: 9 (ref 5–15)
BUN: 8 mg/dL (ref 6–20)
CO2: 22 mmol/L (ref 22–32)
Calcium: 9.1 mg/dL (ref 8.9–10.3)
Chloride: 106 mmol/L (ref 98–111)
Creatinine, Ser: 0.88 mg/dL (ref 0.61–1.24)
GFR calc Af Amer: >60 mL/min (ref >60)
Glucose, Bld: 98 mg/dL (ref 70–99)
Potassium: 3.9 mmol/L (ref 3.5–5.1)
Sodium: 137 mmol/L (ref 135–145)

## 2018-03-13 LAB — I-STAT TROPONIN, ED
TROPONIN I, POC: 0 ng/mL (ref 0.00–0.08)
Troponin i, poc: 0 ng/mL (ref 0.00–0.08)

## 2018-03-13 MED ORDER — MORPHINE SULFATE (PF) 4 MG/ML IV SOLN
4.0000 mg | Freq: Once | INTRAVENOUS | Status: AC
  Administered 2018-03-13: 4 mg via INTRAVENOUS
  Filled 2018-03-13: qty 1

## 2018-03-13 MED ORDER — ALUM & MAG HYDROXIDE-SIMETH 200-200-20 MG/5ML PO SUSP
30.0000 mL | Freq: Once | ORAL | Status: AC
  Administered 2018-03-13: 30 mL via ORAL
  Filled 2018-03-13: qty 30

## 2018-03-13 MED ORDER — ONDANSETRON HCL 4 MG/2ML IJ SOLN
4.0000 mg | Freq: Once | INTRAMUSCULAR | Status: AC
  Administered 2018-03-13: 4 mg via INTRAVENOUS
  Filled 2018-03-13: qty 2

## 2018-03-13 NOTE — ED Triage Notes (Signed)
Pt endorses generalized chest pressure with radiation to both shoulder blades with shob, dizziness and nausea. Smokes and has htn.

## 2018-03-13 NOTE — ED Provider Notes (Signed)
[facility] EMERGENCY DEPARTMENT Provider Note   CSN: [id number] Arrival date & time: 03/13/18  7893     History   Chief Complaint Chief Complaint  Patient presents with  . Chest Pain    HPI [name] [name] is a 48 y.o. male.  The history is provided by the patient. No language interpreter was used.  Chest Pain     48 year old male with history of tobacco abuse, anxiety, OSA, chronic bronchitis presenting to ED for evaluation of chest pain.  Patient report this morning when he woke up he noticed chest pressure.  He described as "elephant sitting on my chest, affecting his left chest radiates to his right shoulder and to his back.  States it was moderate in severity and has since improved.  Also endorsed nausea, mild shortness of breath.  Denies any fever, productive cough, abdominal pain, arm pain or jaw pain.  Patient endorsed significant family history of cardiac disease.  He also report having one similar episode of chest discomfort a week ago with similar fashion but resolve.  He denies any prior history of PE or DVT, no recent surgery or prolonged bedrest.  He admits to tobacco use, as well as alcohol use.  Last alcohol consumption was last night and he cannot recall the total amount.  He denies any recreational drug use such as cocaine.  Past Medical History:  Diagnosis Date  . Asthma   . Bronchitis   . Chronic bronchitis ([place])   . Complication of anesthesia    usually wake up in the middle of procedure or surgery  . Gallstones   . GERD (gastroesophageal reflux disease)   . Hypertension   . Obesity   . Sleep apnea    "suppose to wear mask but I don't" (05/31/2015)    Patient Active Problem List   Diagnosis Date Noted  . Acute conjunctivitis of both eyes 09/04/2017  . Smoker 09/04/2017  . SBO (small bowel obstruction) ([place]) 06/26/2017  . Chronic bronchitis ([place]) 06/26/2017  . Dehydration 06/26/2017  . AKI (acute kidney injury) ([place]) 06/26/2017  .  Erectile dysfunction 04/11/2017  . Lumbar back sprain 01/28/2017  . Diarrhea 11/06/2016  . History of colonic polyps 12/28/2015  . Small bowel obstruction ([place]) 05/31/2015  . Hepatic steatosis 05/31/2015  . Essential hypertension 07/31/2014  . Abdominal pain 07/31/2014  . ANXIETY 11/14/2009  . DEPENDENT EDEMA 11/09/2008  . 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  . ADHD (attention deficit hyperactivity disorder) 10/21/2006  . GERD 10/21/2006  . Morbid obesity ([place]) 07/30/2006  . OSA (obstructive sleep apnea) 07/30/2006  . Hemorrhoids 10/03/2005    Past Surgical History:  Procedure Laterality Date  . CHOLECYSTECTOMY N/A 02/14/2015   Procedure: LAPAROSCOPIC CHOLECYSTECTOMY WITH INTRAOPERATIVE CHOLANGIOGRAM;  Surgeon: Coralie Keens, MD;  Location: [facility];  Service: General;  Laterality: N/A;  . COLONOSCOPY  10/16/2005   Polyps (tubular adenoma 2007), external hemorrhoids  . COLONOSCOPY WITH PROPOFOL N/A 01/05/2016   Procedure: COLONOSCOPY WITH PROPOFOL;  Surgeon: Milus Banister, MD;  Location: [facility];  Service: Endoscopy;  Laterality: N/A;  . FINGER SURGERY Left    reconstructive - 2 surgeries on hand  . LAPAROSCOPIC GASTRIC SLEEVE RESECTION  10/2010    at [facility] Medications    Prior to Admission medications   Medication Sig Start Date End Date Taking? Authorizing Provider  albuterol (ACCUNEB) 0.63 MG/3ML nebulizer solution Take 3 mLs (0.63 mg total) by nebulization every 6 (six)  hours as needed for wheezing or shortness of breath. 05/10/17   Elby Beck, FNP  albuterol (PROAIR HFA) 108 [phone] Base) MCG/ACT inhaler Inhale 2 puffs into the lungs every 6 (six) hours as needed for wheezing or shortness of breath. 08/28/16   Copland, Frederico Hamman, MD  amLODipine (NORVASC) 10 MG tablet Take 1 tablet (10 mg total) by mouth daily. 03/10/18   Lesleigh Noe, MD  cetirizine (ZYRTEC) 10 MG tablet Take 10 mg by mouth daily.    [provider]  omeprazole  (PRILOSEC) 20 MG capsule Take 1 capsule (20 mg total) by mouth daily. 03/10/18   Lesleigh Noe, MD  ondansetron (ZOFRAN ODT) 4 MG disintegrating tablet Take 1 tablet (4 mg total) by mouth every 8 (eight) hours as needed for nausea or vomiting. 06/29/17   Roxan Hockey, MD  oxyCODONE (OXY IR/ROXICODONE) 5 MG immediate release tablet oxycodone 5 mg tablet    [provider]  sildenafil (REVATIO) 20 MG tablet Take 2-5 tablets by mouth 30 minutes prior to intercourse. 04/11/17   [name], NP    Family History Family History  Problem Relation Age of Onset  . Diabetes Mellitus II Mother   . Breast cancer Mother   . Ovarian cancer Mother   . Diabetes Mellitus II Father   . CAD Father   . Stroke Father   . Cancer Father        ?  . Clotting disorder Father        blood clots in brain with surgery  . Cirrhosis Maternal Grandmother   . Breast cancer Maternal Grandmother     Social History Social History   Tobacco Use  . Smoking status: Former Smoker    Packs/day: 1.00    Years: 20.00    Pack years: 20.00    Types: Cigarettes    Start date: 10/03/2016    Last attempt to quit: 10/03/2017    Years since quitting: 0.4  . Smokeless tobacco: Current User    Types: Snuff  Substance Use Topics  . Alcohol use: Yes    Alcohol/week: 13.0 standard drinks    Types: 13 Shots of liquor per week    Comment: "a couple of shots a day"  . Drug use: No     Allergies   Bee venom; Codeine; Hornet venom; and Penicillins   Review of Systems Review of Systems  Cardiovascular: Positive for chest pain.  All other systems reviewed and are negative.    Physical Exam Updated Vital Signs BP (!) 131/99 (BP Location: Left Arm)   Pulse 90   Temp 98.1 F (36.7 C) (Oral)   Resp 16   SpO2 97%   Physical Exam Vitals signs and nursing note reviewed.  Constitutional:      General: He is not in acute distress.    Appearance: He is well-developed. He is obese.  HENT:     Head:  Atraumatic.  Eyes:     Conjunctiva/sclera: Conjunctivae normal.  Neck:     Musculoskeletal: Neck supple.  Cardiovascular:     Rate and Rhythm: Normal rate and regular rhythm.     Heart sounds: Normal heart sounds.  Pulmonary:     Effort: Pulmonary effort is normal.     Breath sounds: Normal breath sounds.  Abdominal:     Palpations: Abdomen is soft.     Tenderness: There is no abdominal tenderness.  Musculoskeletal:        General: No swelling.  Skin:  Findings: No rash.  Neurological:     Mental Status: He is alert and oriented to person, place, and time.      ED Treatments / Results  Labs (all labs ordered are listed, but only abnormal results are displayed) Labs Reviewed  ETHANOL - Abnormal; Notable for the following components:      Result Value   Alcohol, Ethyl (B) 62 (*)    All other components within normal limits  BASIC METABOLIC PANEL  CBC  I-STAT TROPONIN, ED  I-STAT TROPONIN, ED    EKG EKG Interpretation  Date/Time:  Thursday March 13 2018 08:16:34 EST Ventricular Rate:  89 PR Interval:  198 QRS Duration: 100 QT Interval:  380 QTC Calculation: 462 R Axis:   -41 Text Interpretation:  Normal sinus rhythm Left axis deviation Pulmonary disease pattern Incomplete right bundle branch block Abnormal ECG somewhat different than most recent but similar to multiple previous dating back to 2000 Confirmed by Merrily Pew [phone]) on 03/13/2018 9:04:53 AM   Radiology Dg Chest 2 View  Result Date: 03/13/2018 CLINICAL DATA:  Chest pressure with radiation to the shoulders, some shortness of breath and dizziness, smoking history EXAM: CHEST - 2 VIEW COMPARISON:  Chest x-ray of 12/30/2016 FINDINGS: No active infiltrate or effusion is seen. As noted previously there are mild changes of bronchitis most likely chronic in this patient with a smoking history. Mediastinal and hilar contours are unremarkable. The heart is within normal limits in size. No acute bony  abnormality is seen. IMPRESSION: 1. No active cardiopulmonary disease. 2. Possible mild chronic bronchitis. Electronically Signed   By: Ivar Drape M.D.   On: 03/13/2018 09:10    Procedures Procedures (including critical care time)  Medications Ordered in ED Medications  morphine 4 MG/ML injection 4 mg (4 mg Intravenous Given 03/13/18 0921)  ondansetron (ZOFRAN) injection 4 mg (4 mg Intravenous Given 03/13/18 0921)  alum & mag hydroxide-simeth (MAALOX/MYLANTA) 200-200-20 MG/5ML suspension 30 mL (30 mLs Oral Given 03/13/18 0921)  morphine 4 MG/ML injection 4 mg (4 mg Intravenous Given 03/13/18 1114)     Initial Impression / Assessment and Plan / ED Course  I have reviewed the triage vital signs and the nursing notes.  Pertinent labs & imaging results that were available during my care of the patient were reviewed by me and considered in my medical decision making (see chart for details).     BP 125/74 (BP Location: Right Arm)   Pulse 81   Temp 98.1 F (36.7 C) (Oral)   Resp 20   SpO2 100%    Final Clinical Impressions(s) / ED Diagnoses   Final diagnoses:  Atypical chest pain    ED Discharge Orders    None     9:00 AM Patient here with nonexertional chest discomfort which is mostly resolved.  Does have a risk factor for cardiac disease including family history, alcohol abuse and tobacco abuse.  He is obese.  He would benefit from delta troponin, work-up, and outpatient cardiology follow-up.  He does not have any significant risk factor concerning for PE.  GI cocktail given as patient endorsed heavy alcohol use and can potentially have esophagitis or GERD causing his chest discomfort.  12:35 PM EKG did not shows acute ischemic changes, normal delta troponin, mild elevated alcohol level of 62, electrolyte panels are reassuring, normal WBC and normal H&H, chest x-ray without acute concerning changes.  Possible mild chronic bronchitis were noted.  At this time patient is resting  comfortably, vital signs  stable.  No acute emergent medical condition identified.  I encourage patient to follow-up with cardiology for outpatient cardiac work-up which includes stress test as well as following up with PCP for further care.  Return precaution discussed.  Care discussed with Dr. Dayna Barker.   Domenic Moras, PA-C 03/13/18 1241    Mesner, Corene Cornea, MD 03/15/18 [phone]

## 2018-03-13 NOTE — ED Notes (Signed)
Patient verbalizes understanding of discharge instructions. Opportunity for questioning and answers were provided. Armband removed by staff, pt discharged from ED ambulatory by self\  

## 2018-03-13 NOTE — ED Notes (Signed)
Pt in X ray

## 2018-03-13 NOTE — Discharge Instructions (Addendum)
Please call and follow up closely with your doctor and with cardiologist for further evaluation of your chest pain.  You will likely benefit from a cardiac stress test outpatient.  Avoid alcohol and tobacco use.  Return promptly if your condition worsen or if you have any concerns.

## 2018-03-13 NOTE — ED Notes (Signed)
While going over DC paperwork w/ pt, cardiology referral was discussed. Pt states "Do I have to go to that one?". Informed him that this was the one to which he was referred and they'd be able to access his info and care here today. Pt stated "Nope, not going to one that someone tells me I have to go to. I'll find my own." Encouraged pt to f/u w/ cardiology regardless of practice selected. Pt voiced understanding

## 2018-03-17 ENCOUNTER — Encounter: Payer: Self-pay | Admitting: Primary Care

## 2018-03-17 ENCOUNTER — Ambulatory Visit: Payer: BC Managed Care – PPO | Admitting: Primary Care

## 2018-03-17 VITALS — BP 146/96 | HR 87 | Temp 98.0°F | Ht 74.0 in | Wt >= 6400 oz

## 2018-03-17 DIAGNOSIS — I10 Essential (primary) hypertension: Secondary | ICD-10-CM | POA: Diagnosis not present

## 2018-03-17 DIAGNOSIS — R079 Chest pain, unspecified: Secondary | ICD-10-CM | POA: Insufficient documentation

## 2018-03-17 HISTORY — DX: Chest pain, unspecified: R07.9

## 2018-03-17 MED ORDER — LISINOPRIL 10 MG PO TABS: 10.0000 mg | ORAL_TABLET | Freq: Every day | ORAL | 0 refills | Status: DC

## 2018-03-17 NOTE — Assessment & Plan Note (Signed)
Above goal in the office today despite Amlodipine 10 mg. Add in lisinopril 10 mg. Offered to change Amlodipine given ankle edema, he declines as he recently refilled his Amlodipine prescription. Follow up in 2-3 weeks for BP check and BMP.

## 2018-03-17 NOTE — Assessment & Plan Note (Signed)
Intermittent for the last 1-2 weeks, evaluated in the ED at [facility]. Overall negative acute work up in the ED but will need outpatient cardiology evaluation for stress test, etc. Referral placed to cardiology. Will work on evaluation of risk factors including BP control and lipid, glucose check. He will follow up in 2-3 weeks in our office for BP check and fasting labs.  ED notes, labs, imaging reviewed.

## 2018-03-17 NOTE — Progress Notes (Signed)
Subjective:    Patient ID: [name] [name], male    DOB: March 05, 1971, 48 y.o.   MRN: [id number]  HPI  [name] [name] is a 48 year old male with a history of hypertension, OSA, GERD who presents today for emergency department follow up.  He presented to [facility] on 03/13/17 with a chief complaint of left sided chest pressure. He described the pressure as "an elephant sitting on my chest" with radiation to right shoulder and back, also with nausea, shortness of breath.   During his stay in the ED ECG revealed NSR with rate of 89. It was abnormal and slightly different that his most recent ECG. It was noted that this ECG was similar numerous prior ECG's since 2000. No acute ischemia. Labs with negative Troponin X 3, normal electrolytes, chest xray with chronic bronchitis. Given grossly normal work up he was discharged home and asked to follow up with cardiology.  Since his ED visit he's noticed intermittent pressure to the substernal chest, including today. He's not gotten connected with a cardiologist as of yet, would like to see Dr. Lovena Le. He has a strong family history of heart disease in his father (CABG, defibrillator placement).    BP Readings from Last 3 Encounters:  03/17/18 (!) 146/96  03/13/18 (!) 147/87  03/10/18 (!) 150/94   He is complaint to his amlodipine 10 mg daily, doesn't check his BP at home. He has noticed some headaches. Denies dizziness. He will notice swelling to his lower extremities, this has been present for the last 6 months since the increase of his Amlodipine. He has been smoking intermittently for 20 years, quit for 9 years, then has been off and on cigarettes since. He is smoking less than 1/2 PPD.   Review of Systems  Constitutional: Negative for fatigue.  Eyes: Negative for visual disturbance.  Respiratory: Negative for cough and shortness of breath.   Cardiovascular: Positive for leg swelling.       Chest pressure  Neurological: Positive for headaches.  Negative for dizziness.       Past Medical History:  Diagnosis Date  . Asthma   . Bronchitis   . Chronic bronchitis ([place])   . Complication of anesthesia    usually wake up in the middle of procedure or surgery  . Gallstones   . GERD (gastroesophageal reflux disease)   . Hypertension   . Obesity   . Sleep apnea    "suppose to wear mask but I don't" (05/31/2015)     Social History   Socioeconomic History  . Marital status: Legally Separated    Spouse name: Not on file  . Number of children: 1  . Years of education: Not on file  . Highest education level: Not on file  Occupational History  . Occupation: Publishing rights manager: [employer]  Social Needs  . Financial resource strain: Not on file  . Food insecurity:    Worry: Not on file    Inability: Not on file  . Transportation needs:    Medical: Not on file    Non-medical: Not on file  Tobacco Use  . Smoking status: Former Smoker    Packs/day: 1.00    Years: 20.00    Pack years: 20.00    Types: Cigarettes    Start date: 10/03/2016    Last attempt to quit: 10/03/2017    Years since quitting: 0.4  . Smokeless tobacco: Current User    Types: Snuff  Substance and Sexual Activity  .  Alcohol use: Yes    Alcohol/week: 13.0 standard drinks    Types: 13 Shots of liquor per week    Comment: "a couple of shots a day"  . Drug use: No  . Sexual activity: Yes  Lifestyle  . Physical activity:    Days per week: Not on file    Minutes per session: Not on file  . Stress: Not on file  Relationships  . Social connections:    Talks on phone: Not on file    Gets together: Not on file    Attends religious service: Not on file    Active member of club or organization: Not on file    Attends meetings of clubs or organizations: Not on file    Relationship status: Not on file  . Intimate partner violence:    Fear of current or ex partner: Not on file    Emotionally abused: Not on file    Physically abused: Not on  file    Forced sexual activity: Not on file  Other Topics Concern  . Not on file  Social History Narrative  . Not on file    Past Surgical History:  Procedure Laterality Date  . CHOLECYSTECTOMY N/A 02/14/2015   Procedure: LAPAROSCOPIC CHOLECYSTECTOMY WITH INTRAOPERATIVE CHOLANGIOGRAM;  Surgeon: Coralie Keens, MD;  Location: [facility];  Service: General;  Laterality: N/A;  . COLONOSCOPY  10/16/2005   Polyps (tubular adenoma 2007), external hemorrhoids  . COLONOSCOPY WITH PROPOFOL N/A 01/05/2016   Procedure: COLONOSCOPY WITH PROPOFOL;  Surgeon: Milus Banister, MD;  Location: [facility];  Service: Endoscopy;  Laterality: N/A;  . FINGER SURGERY Left    reconstructive - 2 surgeries on hand  . LAPAROSCOPIC GASTRIC SLEEVE RESECTION  10/2010    at [facility] [place] History  Problem Relation Age of Onset  . Diabetes Mellitus II Mother   . Breast cancer Mother   . Ovarian cancer Mother   . Diabetes Mellitus II Father   . CAD Father   . Stroke Father   . Cancer Father        ?  . Clotting disorder Father        blood clots in brain with surgery  . Cirrhosis Maternal Grandmother   . Breast cancer Maternal Grandmother     Allergies  Allergen Reactions  . Bee Venom Anaphylaxis  . Codeine Anaphylaxis    Tolerates dilaudid  . Hornet Venom Anaphylaxis  . Penicillins Hives and Other (See Comments)    Has patient had a PCN reaction causing immediate rash, facial/tongue/throat swelling, SOB or lightheadedness with hypotensionYES Has patient had a PCN reaction causing severe rash involving mucus membranes or skin necrosis: NO Has patient had a PCN reaction that required hospitalization NO Has patient had a PCN reaction occurring within the last 10 years: NO If all of the above answers are "NO", then may proceed with Cephalosporin use.    Current Outpatient Medications on File Prior to Visit  Medication Sig Dispense Refill  . albuterol (ACCUNEB) 0.63 MG/3ML nebulizer solution Take 3 mLs  (0.63 mg total) by nebulization every 6 (six) hours as needed for wheezing or shortness of breath. 75 mL 3  . albuterol (PROAIR HFA) 108 (90 Base) MCG/ACT inhaler Inhale 2 puffs into the lungs every 6 (six) hours as needed for wheezing or shortness of breath. 1 Inhaler 3  . amLODipine (NORVASC) 10 MG tablet Take 1 tablet (10 mg total) by mouth daily. 90 tablet 0  . omeprazole (PRILOSEC)  20 MG capsule Take 1 capsule (20 mg total) by mouth daily. 90 capsule 0  . oxyCODONE (OXY IR/ROXICODONE) 5 MG immediate release tablet Take 5 mg by mouth every 6 (six) hours as needed for moderate pain.     . sildenafil (REVATIO) 20 MG tablet Take 2-5 tablets by mouth 30 minutes prior to intercourse. (Patient taking differently: Take 20 mg by mouth daily as needed. Take 2-5 tablets by mouth 30 minutes prior to intercourse.) 50 tablet 0  . ondansetron (ZOFRAN ODT) 4 MG disintegrating tablet Take 1 tablet (4 mg total) by mouth every 8 (eight) hours as needed for nausea or vomiting. (Patient not taking: Reported on 03/17/2018) 10 tablet 0   No current facility-administered medications on file prior to visit.     BP (!) 146/96   Pulse 87   Temp 98 F (36.7 C) (Oral)   Ht 6\' 2"  (1.88 m)   Wt (!) 413 lb 12 oz (187.7 kg)   SpO2 96%   BMI 53.12 kg/m    Objective:   Physical Exam  Constitutional: He appears well-nourished.  Neck: Neck supple.  Cardiovascular: Normal rate and regular rhythm.  Respiratory: Effort normal and breath sounds normal.  Skin: Skin is warm and dry.           Assessment & Plan:

## 2018-03-17 NOTE — Patient Instructions (Signed)
Start lisinopril 10 mg tablets for blood pressure. Continue Amlodipine 10 mg tablets for blood pressure.  Start monitoring your blood pressure daily, around the same time of day, for the next 2-3weeks.  Ensure that you have rested for 30 minutes prior to checking your blood pressure. Record your readings and bring them to your next visit.  You will be contacted regarding your referral to cardiology.  Please let us know if you have not been contacted within one week.   Please schedule a physical and BP check with me in 2-3 weeks. Make sure to come fasting so we can do labs. No food for four hours prior, water only.  It was a pleasure to see you today!

## 2018-03-31 NOTE — Telephone Encounter (Signed)
Raquel Sarna, did you ever see this message sent on 03/10/18?

## 2018-04-03 ENCOUNTER — Encounter: Payer: Self-pay | Admitting: Primary Care

## 2018-04-03 ENCOUNTER — Ambulatory Visit (INDEPENDENT_AMBULATORY_CARE_PROVIDER_SITE_OTHER): Payer: BC Managed Care – PPO | Admitting: Primary Care

## 2018-04-03 VITALS — BP 130/86 | HR 88 | Temp 98.1°F | Ht 74.0 in | Wt >= 6400 oz

## 2018-04-03 DIAGNOSIS — F17200 Nicotine dependence, unspecified, uncomplicated: Secondary | ICD-10-CM

## 2018-04-03 DIAGNOSIS — I10 Essential (primary) hypertension: Secondary | ICD-10-CM

## 2018-04-03 DIAGNOSIS — J42 Unspecified chronic bronchitis: Secondary | ICD-10-CM

## 2018-04-03 DIAGNOSIS — Z0000 Encounter for general adult medical examination without abnormal findings: Secondary | ICD-10-CM

## 2018-04-03 DIAGNOSIS — N529 Male erectile dysfunction, unspecified: Secondary | ICD-10-CM

## 2018-04-03 DIAGNOSIS — D124 Benign neoplasm of descending colon: Secondary | ICD-10-CM

## 2018-04-03 DIAGNOSIS — K219 Gastro-esophageal reflux disease without esophagitis: Secondary | ICD-10-CM

## 2018-04-03 DIAGNOSIS — G4733 Obstructive sleep apnea (adult) (pediatric): Secondary | ICD-10-CM

## 2018-04-03 DIAGNOSIS — K760 Fatty (change of) liver, not elsewhere classified: Secondary | ICD-10-CM | POA: Diagnosis not present

## 2018-04-03 DIAGNOSIS — M1990 Unspecified osteoarthritis, unspecified site: Secondary | ICD-10-CM | POA: Insufficient documentation

## 2018-04-03 DIAGNOSIS — Z86010 Personal history of colonic polyps: Secondary | ICD-10-CM

## 2018-04-03 DIAGNOSIS — M159 Polyosteoarthritis, unspecified: Secondary | ICD-10-CM

## 2018-04-03 DIAGNOSIS — R079 Chest pain, unspecified: Secondary | ICD-10-CM

## 2018-04-03 DIAGNOSIS — M150 Primary generalized (osteo)arthritis: Secondary | ICD-10-CM | POA: Diagnosis not present

## 2018-04-03 LAB — COMPREHENSIVE METABOLIC PANEL
ALT: 33 U/L (ref 0–53)
AST: 22 U/L (ref 0–37)
Albumin: 4.4 g/dL (ref 3.5–5.2)
Alkaline Phosphatase: 51 U/L (ref 39–117)
BUN: 13 mg/dL (ref 6–23)
CO2: 28 mEq/L (ref 19–32)
Calcium: 9.3 mg/dL (ref 8.4–10.5)
Chloride: 100 mEq/L (ref 96–112)
Creatinine, Ser: 0.94 mg/dL (ref 0.40–1.50)
GFR: 86.29 mL/min (ref >60.00)
Glucose, Bld: 99 mg/dL (ref 70–99)
Potassium: 4.2 mEq/L (ref 3.5–5.1)
Sodium: 134 mEq/L — ABNORMAL LOW (ref 135–145)
TOTAL PROTEIN: 7.1 g/dL (ref 6.0–8.3)
Total Bilirubin: 0.6 mg/dL (ref 0.2–1.2)

## 2018-04-03 LAB — LIPID PANEL
CHOL/HDL RATIO: 3
Cholesterol: 153 mg/dL (ref 0–200)
HDL: 51 mg/dL (ref >39.00)
LDL Cholesterol: 81 mg/dL (ref 0–99)
NONHDL: 102.13
Triglycerides: 104 mg/dL (ref 0.0–149.0)
VLDL: 20.8 mg/dL (ref 0.0–40.0)

## 2018-04-03 LAB — HEMOGLOBIN A1C: Hgb A1c MFr Bld: 5.1 % (ref 4.6–6.5)

## 2018-04-03 MED ORDER — LISINOPRIL 10 MG PO TABS: 10.0000 mg | ORAL_TABLET | Freq: Every day | ORAL | 3 refills | Status: DC

## 2018-04-03 NOTE — Assessment & Plan Note (Signed)
Strongly advised he quit smoking.  He is not ready. Offered my services when ready.

## 2018-04-03 NOTE — Patient Instructions (Signed)
Stop by the lab prior to leaving today. I will notify you of your results once received.   I sent refills of lisinopril to your pharmacy. This is for blood pressure. Continue taking Amlodipine 10 mg for blood pressure.   Start exercising. You should be getting 150 minutes of moderate intensity exercise weekly.  It's important to improve your diet by reducing consumption of fast food, fried food, processed snack foods, sugary drinks. Increase consumption of fresh vegetables and fruits, whole grains, water.  Ensure you are drinking 64 ounces of water daily.  Follow up with the cardiologist as scheduled.   It was a pleasure to see you today!   Preventive Care 40-64 Years, Male Preventive care refers to lifestyle choices and visits with your health care provider that can promote health and wellness. What does preventive care include?   A yearly physical exam. This is also called an annual well check.  Dental exams once or twice a year.  Routine eye exams. Ask your health care provider how often you should have your eyes checked.  Personal lifestyle choices, including: ? Daily care of your teeth and gums. ? Regular physical activity. ? Eating a healthy diet. ? Avoiding tobacco and drug use. ? Limiting alcohol use. ? Practicing safe sex. ? Taking low-dose aspirin every day starting at age 50. What happens during an annual well check? The services and screenings done by your health care provider during your annual well check will depend on your age, overall health, lifestyle risk factors, and family history of disease. Counseling Your health care provider may ask you questions about your:  Alcohol use.  Tobacco use.  Drug use.  Emotional well-being.  Home and relationship well-being.  Sexual activity.  Eating habits.  Work and work environment. Screening You may have the following tests or measurements:  Height, weight, and BMI.  Blood pressure.  Lipid and  cholesterol levels. These may be checked every 5 years, or more frequently if you are over 50 years old.  Skin check.  Lung cancer screening. You may have this screening every year starting at age 55 if you have a 30-pack-year history of smoking and currently smoke or have quit within the past 15 years.  Colorectal cancer screening. All adults should have this screening starting at age 50 and continuing until age 75. Your health care provider may recommend screening at age 45. You will have tests every 1-10 years, depending on your results and the type of screening test. People at increased risk should start screening at an earlier age. Screening tests may include: ? Guaiac-based fecal occult blood testing. ? Fecal immunochemical test (FIT). ? Stool DNA test. ? Virtual colonoscopy. ? Sigmoidoscopy. During this test, a flexible tube with a tiny camera (sigmoidoscope) is used to examine your rectum and lower colon. The sigmoidoscope is inserted through your anus into your rectum and lower colon. ? Colonoscopy. During this test, a long, thin, flexible tube with a tiny camera (colonoscope) is used to examine your entire colon and rectum.  Prostate cancer screening. Recommendations will vary depending on your family history and other risks.  Hepatitis C blood test.  Hepatitis B blood test.  Sexually transmitted disease (STD) testing.  Diabetes screening. This is done by checking your blood sugar (glucose) after you have not eaten for a while (fasting). You may have this done every 1-3 years. Discuss your test results, treatment options, and if necessary, the need for more tests with your health care provider. Vaccines   Your health care provider may recommend certain vaccines, such as:  Influenza vaccine. This is recommended every year.  Tetanus, diphtheria, and acellular pertussis (Tdap, Td) vaccine. You may need a Td booster every 10 years.  Varicella vaccine. You may need this if you have  not been vaccinated.  Zoster vaccine. You may need this after age 76.  Measles, mumps, and rubella (MMR) vaccine. You may need at least one dose of MMR if you were born in 1957 or later. You may also need a second dose.  Pneumococcal 13-valent conjugate (PCV13) vaccine. You may need this if you have certain conditions and have not been vaccinated.  Pneumococcal polysaccharide (PPSV23) vaccine. You may need one or two doses if you smoke cigarettes or if you have certain conditions.  Meningococcal vaccine. You may need this if you have certain conditions.  Hepatitis A vaccine. You may need this if you have certain conditions or if you travel or work in places where you may be exposed to hepatitis A.  Hepatitis B vaccine. You may need this if you have certain conditions or if you travel or work in places where you may be exposed to hepatitis B.  Haemophilus influenzae type b (Hib) vaccine. You may need this if you have certain risk factors. Talk to your health care provider about which screenings and vaccines you need and how often you need them. This information is not intended to replace advice given to you by your health care provider. Make sure you discuss any questions you have with your health care provider. Document Released: 03/18/2015 Document Revised: 04/11/2017 Document Reviewed: 12/21/2014 Elsevier Interactive Patient Education  2019 [employer].

## 2018-04-03 NOTE — Assessment & Plan Note (Signed)
Tetanus is up-to-date, declines influenza vaccination. Colonoscopy is up-to-date, due in 2022. Strongly advised he work on weight loss through healthy diet and regular exercise. Exam stable. Labs pending. Follow-up in 1 year for CPE.

## 2018-04-03 NOTE — Progress Notes (Signed)
Subjective:    Patient ID: [name] [name], male    DOB: April 13, 1970, 48 y.o.   MRN: [id number]  HPI  [name] [name] is a 48 year old male who presents today for complete physical and follow-up for hypertension.  Immunizations: -Tetanus: Completed in 2019 -Influenza: Declines    Diet: He endorses a fair diet Breakfast: Skips Lunch: Sandwich  Dinner: Chicken, pizza, pasta, little vegetables Snacks: Crackers Desserts: Daily  Beverages: Diet soda, liquor twice weekly, water  Exercise: He is not exercising, active at work Eye exam: Completed years ago Dental exam: No recent exam Colonoscopy: Completed in 2017, due in 2022.  BP Readings from Last 3 Encounters:  04/03/18 130/86  03/17/18 (!) 146/96  03/13/18 (!) 147/87     Review of Systems  Constitutional: Negative for unexpected weight change.  HENT: Negative for rhinorrhea.   Respiratory: Negative for cough and shortness of breath.   Cardiovascular: Negative for chest pain.  Gastrointestinal: Negative for constipation and diarrhea.  Genitourinary: Negative for difficulty urinating.  Musculoskeletal: Positive for arthralgias.       Chronic knee and back pain  Skin: Negative for rash.  Allergic/Immunologic: Negative for environmental allergies.  Neurological: Negative for dizziness, numbness and headaches.  Psychiatric/Behavioral:       Recent stress with recent passing of his mother       Past Medical History:  Diagnosis Date  . Abdominal pain 07/31/2014  . Abnormal CT scan, small bowel 12/28/2015  . Acute bronchitis 04/09/2013  . Acute conjunctivitis of both eyes 09/04/2017  . Acute pharyngitis 04/13/2013  . ADHD (attention deficit hyperactivity disorder) 10/21/2006   Qualifier: Diagnosis of  By: Maxie Better FNP, Rosalita Levan   . AKI (acute kidney injury) ([place]) 06/26/2017  . Alternating constipation and diarrhea 10/12/2015  . ANGER 10/31/2006   Qualifier: Diagnosis of  By: Maxie Better FNP, Rosalita Levan   . ANXIETY  11/14/2009   Qualifier: Diagnosis of  By: Deborra Medina MD, Tanja Port    . Asthma   . 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 Bronchitis   . Calculus of gallbladder with acute cholecystitis without obstruction 02/15/2015  . Chest pain 03/17/2018  . Chest wall pain 02/10/2015  . Cholelithiasis 02/13/2015  . Chronic bronchitis ([place])   . Complication of anesthesia    usually wake up in the middle of procedure or surgery  . Conjunctivitis 10/18/2010  . Dehydration 06/26/2017  . DEPENDENT EDEMA 11/09/2008   Qualifier: Diagnosis of  By: Maxie Better FNP, Rosalita Levan   . Diarrhea 11/06/2016  . Enteritis 12/21/2015  . Erectile dysfunction 04/11/2017  . Gallstone 02/10/2015  . Gallstones   . GASTROENTERITIS WITHOUT DEHYDRATION 05/15/2010   Qualifier: Diagnosis of  By: Deborra Medina MD, Tanja Port    . GERD 10/21/2006   Qualifier: Diagnosis of  By: Maxie Better FNP, Rosalita Levan   . GERD (gastroesophageal reflux disease)   . Hepatic steatosis 05/31/2015  . History of colonic polyps 12/28/2015  . Hypertension   . Lumbar back sprain 01/28/2017  . Mole of skin 05/28/2012  . Morbid obesity ([place]) 07/30/2006   Qualifier: Diagnosis of  By: Maxie Better FNP, Rosalita Levan   . Nausea vomiting and diarrhea 07/31/2014  . Obesity   . OSA (obstructive sleep apnea) 07/30/2006   Qualifier: Diagnosis of  By: Maxie Better FNP, Rosalita Levan   . Partial small bowel obstruction ([place]) 12/14/2015  . Right knee pain 12/01/2012  . SBO (small bowel obstruction) ([place]) 06/26/2017  .  Skin lesion 11/07/2014  . Skin tag 05/28/2012  . Sleep apnea    "suppose to wear mask but I don't" (05/31/2015)  . Small bowel obstruction ([place]) 05/31/2015  . Small bowel obstruction, partial ([place]) 12/13/2015  . Smoker 09/04/2017  . Viral gastroenteritis 03/23/2016     Social History   Socioeconomic History  . Marital status: Legally Separated    Spouse name: Not on file  . Number of children: 1  . Years of  education: Not on file  . Highest education level: Not on file  Occupational History  . Occupation: Publishing rights manager: [employer]  Social Needs  . Financial resource strain: Not on file  . Food insecurity:    Worry: Not on file    Inability: Not on file  . Transportation needs:    Medical: Not on file    Non-medical: Not on file  Tobacco Use  . Smoking status: Former Smoker    Packs/day: 1.00    Years: 20.00    Pack years: 20.00    Types: Cigarettes    Start date: 10/03/2016    Last attempt to quit: 10/03/2017    Years since quitting: 0.4  . Smokeless tobacco: Current User    Types: Snuff  Substance and Sexual Activity  . Alcohol use: Yes    Alcohol/week: 13.0 standard drinks    Types: 13 Shots of liquor per week    Comment: "a couple of shots a day"  . Drug use: No  . Sexual activity: Yes  Lifestyle  . Physical activity:    Days per week: Not on file    Minutes per session: Not on file  . Stress: Not on file  Relationships  . Social connections:    Talks on phone: Not on file    Gets together: Not on file    Attends religious service: Not on file    Active member of club or organization: Not on file    Attends meetings of clubs or organizations: Not on file    Relationship status: Not on file  . Intimate partner violence:    Fear of current or ex partner: Not on file    Emotionally abused: Not on file    Physically abused: Not on file    Forced sexual activity: Not on file  Other Topics Concern  . Not on file  Social History Narrative  . Not on file    Past Surgical History:  Procedure Laterality Date  . CHOLECYSTECTOMY N/A 02/14/2015   Procedure: LAPAROSCOPIC CHOLECYSTECTOMY WITH INTRAOPERATIVE CHOLANGIOGRAM;  Surgeon: Coralie Keens, MD;  Location: [facility];  Service: General;  Laterality: N/A;  . COLONOSCOPY  10/16/2005   Polyps (tubular adenoma 2007), external hemorrhoids  . COLONOSCOPY WITH PROPOFOL N/A 01/05/2016   Procedure:  COLONOSCOPY WITH PROPOFOL;  Surgeon: Milus Banister, MD;  Location: [facility];  Service: Endoscopy;  Laterality: N/A;  . FINGER SURGERY Left    reconstructive - 2 surgeries on hand  . LAPAROSCOPIC GASTRIC SLEEVE RESECTION  10/2010    at [facility] History  Problem Relation Age of Onset  . Diabetes Mellitus II Mother   . Breast cancer Mother   . Ovarian cancer Mother   . Diabetes Mellitus II Father   . CAD Father   . Stroke Father   . Cancer Father        ?  . Clotting disorder Father        blood clots in  brain with surgery  . Cirrhosis Maternal Grandmother   . Breast cancer Maternal Grandmother     Allergies  Allergen Reactions  . Bee Venom Anaphylaxis  . Codeine Anaphylaxis    Tolerates dilaudid  . Hornet Venom Anaphylaxis  . Penicillins Hives and Other (See Comments)    Has patient had a PCN reaction causing immediate rash, facial/tongue/throat swelling, SOB or lightheadedness with hypotensionYES Has patient had a PCN reaction causing severe rash involving mucus membranes or skin necrosis: NO Has patient had a PCN reaction that required hospitalization NO Has patient had a PCN reaction occurring within the last 10 years: NO If all of the above answers are "NO", then may proceed with Cephalosporin use.    Current Outpatient Medications on File Prior to Visit  Medication Sig Dispense Refill  . albuterol (ACCUNEB) 0.63 MG/3ML nebulizer solution Take 3 mLs (0.63 mg total) by nebulization every 6 (six) hours as needed for wheezing or shortness of breath. 75 mL 3  . albuterol (PROAIR HFA) 108 (90 Base) MCG/ACT inhaler Inhale 2 puffs into the lungs every 6 (six) hours as needed for wheezing or shortness of breath. 1 Inhaler 3  . amLODipine (NORVASC) 10 MG tablet Take 1 tablet (10 mg total) by mouth daily. 90 tablet 0  . omeprazole (PRILOSEC) 20 MG capsule Take 1 capsule (20 mg total) by mouth daily. 90 capsule 0  . ondansetron (ZOFRAN ODT) 4 MG disintegrating tablet Take  1 tablet (4 mg total) by mouth every 8 (eight) hours as needed for nausea or vomiting. 10 tablet 0  . sildenafil (REVATIO) 20 MG tablet Take 2-5 tablets by mouth 30 minutes prior to intercourse. (Patient taking differently: Take 20 mg by mouth daily as needed. Take 2-5 tablets by mouth 30 minutes prior to intercourse.) 50 tablet 0   No current facility-administered medications on file prior to visit.     BP 130/86   Pulse 88   Temp 98.1 F (36.7 C) (Oral)   Ht 6\' 2"  (1.88 m)   Wt (!) 412 lb (186.9 kg)   SpO2 99%   BMI 52.90 kg/m    Objective:   Physical Exam  Constitutional: He is oriented to person, place, and time. He appears well-nourished.  HENT:  Mouth/Throat: No oropharyngeal exudate.  Eyes: Pupils are equal, round, and reactive to light. EOM are normal.  Neck: Neck supple. No thyromegaly present.  Cardiovascular: Normal rate and regular rhythm.  Respiratory: Effort normal and breath sounds normal.  GI: Soft. Bowel sounds are normal. There is no abdominal tenderness.  Musculoskeletal: Normal range of motion.  Neurological: He is alert and oriented to person, place, and time.  Skin: Skin is warm and dry.  Psychiatric: He has a normal mood and affect.           Assessment & Plan:

## 2018-04-03 NOTE — Assessment & Plan Note (Signed)
Using sildenafil as needed, continue same.

## 2018-04-03 NOTE — Assessment & Plan Note (Signed)
Improved with lisinopril 10 mg, continue same.  Continue amlodipine 10 mg. BMP pending.

## 2018-04-03 NOTE — Assessment & Plan Note (Signed)
Discussed the importance of a healthy diet and regular exercise in order for weight loss, and to reduce the risk of any potential medical problems.  

## 2018-04-03 NOTE — Assessment & Plan Note (Signed)
Due for repeat colonoscopy in 2022. 

## 2018-04-03 NOTE — Assessment & Plan Note (Addendum)
Endorses this is located to the left knee and lower back. Is using oxycodone from an older prescription, discouraged use. Discussed that I do not prescribe this medication and that if he has continued pain off of this medication then he will need to establish with pain management.

## 2018-04-03 NOTE — Assessment & Plan Note (Signed)
Continues to be noncompliant with CPAP machine.  No sleep study for 8 years. Recommended we get him connected with pulmonology for repeat sleep study and CPAP equipment, he kindly declines. Discussed OSA in the setting of hypertension and the risks for chronic heart disease moving forward. He will update when ready.

## 2018-04-03 NOTE — Assessment & Plan Note (Addendum)
Using albuterol inhaler three times weekly on average, overall doing well. Discussed to notify if he requires use more than 3 times weekly on average.  He verbalized understanding.

## 2018-04-03 NOTE — Assessment & Plan Note (Signed)
Doing well on omeprazole daily, continue same.

## 2018-04-03 NOTE — Assessment & Plan Note (Signed)
Colonoscopy completed in 2017, due for repeat in 2020.

## 2018-04-03 NOTE — Assessment & Plan Note (Signed)
Denies chest pain since last visit. He will see cardiology in mid February.

## 2018-04-08 ENCOUNTER — Encounter: Payer: Self-pay | Admitting: *Deleted

## 2018-04-10 NOTE — Progress Notes (Signed)
Cardiology Office Note   Date:  04/10/2018   ID:  [name] [name], DOB 10-03-1970, MRN [id number]  PCP:  [name], [name]  Cardiologist:   Jenkins Rouge, [name]   No chief complaint on file.     History of Present Illness: [name] [name] is a 48 y.o. male who presents for consultation regarding chest pain. 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 ER w/u negative troponin no acute ECG Changes CXR chronic bronchitis ETOH elevated 62  Past Medical History:  Diagnosis Date  . Abdominal pain 07/31/2014  . Abnormal CT scan, small bowel 12/28/2015  . Acute bronchitis 04/09/2013  . Acute conjunctivitis of both eyes 09/04/2017  . Acute pharyngitis 04/13/2013  . ADHD (attention deficit hyperactivity disorder) 10/21/2006   Qualifier: Diagnosis of  By: [name] [name], [name] [name]   . AKI (acute kidney injury) ([place]) 06/26/2017  . Alternating constipation and diarrhea 10/12/2015  . ANGER 10/31/2006   Qualifier: Diagnosis of  By: [name] [name], [name] [name]   . ANXIETY 11/14/2009   Qualifier: Diagnosis of  By: [name] [name], [name] [name]    . Asthma   . 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   Qualifier: Diagnosis of  By: [name] [name], [name] [name]   . Bronchitis   . Calculus of gallbladder with acute cholecystitis without obstruction 02/15/2015  . Chest pain 03/17/2018  . Chest wall pain 02/10/2015  . Cholelithiasis 02/13/2015  . Chronic bronchitis ([place])   . Complication of anesthesia    usually wake up in the middle of procedure or surgery  . Conjunctivitis 10/18/2010  .  Dehydration 06/26/2017  . DEPENDENT EDEMA 11/09/2008   Qualifier: Diagnosis of  By: [name] [name], [name] [name]   . Diarrhea 11/06/2016  . Enteritis 12/21/2015  . Erectile dysfunction 04/11/2017  . Gallstone 02/10/2015  . Gallstones   . GASTROENTERITIS WITHOUT DEHYDRATION 05/15/2010   Qualifier: Diagnosis of  By: [name] [name], [name] [name]    . GERD 10/21/2006   Qualifier: Diagnosis of  By: [name] [name], [name] [name]   . GERD (gastroesophageal reflux disease)   . Hepatic steatosis 05/31/2015  . History of colonic polyps 12/28/2015  . Hypertension   . Lumbar back sprain 01/28/2017  . Mole of skin 05/28/2012  . Morbid obesity ([place]) 07/30/2006   Qualifier: Diagnosis of  By: [name] [name], [name] [name]   . Nausea vomiting and diarrhea 07/31/2014  . Obesity   . OSA (obstructive sleep apnea) 07/30/2006   Qualifier: Diagnosis of  By: [name] [name], [name] [name]   . Partial small bowel obstruction ([place]) 12/14/2015  . Right knee pain 12/01/2012  . SBO (small bowel obstruction) ([place]) 06/26/2017  . Skin lesion 11/07/2014  . Skin tag 05/28/2012  . Sleep apnea    "suppose to wear mask but I don't" (05/31/2015)  . Small bowel obstruction ([place]) 05/31/2015  . Small bowel obstruction, partial ([place]) 12/13/2015  . Smoker 09/04/2017  . Viral gastroenteritis 03/23/2016    Past Surgical History:  Procedure Laterality Date  . CHOLECYSTECTOMY N/A 02/14/2015   Procedure: LAPAROSCOPIC CHOLECYSTECTOMY WITH INTRAOPERATIVE CHOLANGIOGRAM;  Surgeon: [name] Keens, [name];  Location: [facility];  Service: General;  Laterality: N/A;  . COLONOSCOPY  10/16/2005   Polyps (tubular adenoma 2007), external hemorrhoids  . COLONOSCOPY WITH PROPOFOL N/A 01/05/2016   Procedure: COLONOSCOPY WITH PROPOFOL;  Surgeon: [name] Banister, [name];  Location: [facility];  Service: Endoscopy;  Laterality: N/A;  . FINGER SURGERY Left    reconstructive - 2 surgeries on hand  . LAPAROSCOPIC GASTRIC SLEEVE RESECTION  10/2010    at [facility]     Current Outpatient  Medications  Medication Sig Dispense Refill  . albuterol (ACCUNEB) 0.63 MG/3ML nebulizer solution Take 3 mLs (0.63 mg total) by nebulization every 6 (six) hours as needed for wheezing or shortness of breath. 75 mL 3  . albuterol (PROAIR HFA) 108 (90 Base) MCG/ACT inhaler Inhale 2 puffs into the lungs every 6 (six) hours as needed for wheezing or shortness of breath. 1 Inhaler 3  . amLODipine (NORVASC) 10 MG tablet Take 1 tablet (10 mg total) by mouth daily. 90 tablet 0  . lisinopril (PRINIVIL,ZESTRIL) 10 MG tablet Take 1 tablet (10 mg total) by mouth daily. For blood pressure. 90 tablet 3  . omeprazole (PRILOSEC) 20 MG capsule Take 1 capsule (20 mg total) by mouth daily. 90 capsule 0  . ondansetron (ZOFRAN ODT) 4 MG disintegrating tablet Take 1 tablet (4 mg total) by mouth every 8 (eight) hours as needed for nausea or vomiting. 10 tablet 0  . sildenafil (REVATIO) 20 MG tablet Take 2-5 tablets by mouth 30 minutes prior to intercourse. (Patient taking differently: Take 20 mg by mouth daily as needed. Take 2-5 tablets by mouth 30 minutes prior to intercourse.) 50 tablet 0   No current facility-administered medications for this visit.     Allergies:   Bee venom; Codeine; Hornet venom; and Penicillins    Social History:  The patient  reports that he quit smoking about 6 months ago. His smoking use included cigarettes. He started smoking about 18 months ago. He has a 20.00 pack-year smoking history. His smokeless tobacco use includes snuff. He reports current alcohol use of about 13.0 standard drinks of alcohol per week. He reports that he does not use drugs.   Family History:  The patient's family history includes Breast cancer in his maternal grandmother and mother; CAD in his father; Cancer in his father; Cirrhosis in his maternal grandmother; Clotting disorder in his father; Diabetes Mellitus II in his father and mother; Ovarian cancer in his mother; Stroke in his father.    ROS:  Please see the  history of present illness.   Otherwise, review of systems are positive for none.   All other systems are reviewed and negative.    PHYSICAL EXAM: VS:  There were no vitals taken for this visit. , BMI There is no height or weight on file to calculate BMI. Affect appropriate Healthy:  appears stated age 33: normal Neck supple with no adenopathy JVP normal no bruits no thyromegaly Lungs clear with no wheezing and good diaphragmatic motion Heart:  S1/S2 no murmur, no rub, gallop or click PMI normal Abdomen: benighn, BS positve, no tenderness, no AAA no bruit.  No HSM or HJR Distal pulses intact with no bruits No edema Neuro non-focal Skin warm and dry No muscular weakness    EKG:  03/14/18 SR LAD ICRBBB pulmonary disease pattern    Recent Labs: 03/13/2018: Hemoglobin 16.0; Platelets 213 04/03/2018: ALT 33; BUN 13; Creatinine, Ser 0.94; Potassium 4.2; Sodium 134    Lipid Panel  Component Value Date/Time   CHOL 153 04/03/2018 1149   TRIG 104.0 04/03/2018 1149   TRIG 87 03/18/2006 0953   HDL 51.00 04/03/2018 1149   CHOLHDL 3 04/03/2018 1149   VLDL 20.8 04/03/2018 1149   LDLCALC 81 04/03/2018 1149      Wt Readings from Last 3 Encounters:  04/03/18 (!) 412 lb (186.9 kg)  03/17/18 (!) 413 lb 12 oz (187.7 kg)  03/10/18 (!) 416 lb 8 oz (188.9 kg)      Other studies Reviewed: Additional studies/ records that were reviewed today include: Notes from ER, labs, CXR ECG notes from primary .    ASSESSMENT AND PLAN:  1.  Chest Pain:  Negative w/u in ER. Smoker with family history of CAD and hypertension f.u myovue  2.  GERD:  Related to previous gastric sleeve and ETOH continue prilosec f/u primary May  Be etiology for atypical chest pain 3. Smoking counseled on cessation CXR 03/13/18 chronic bronchitis has inhaler  4. HTN:  Well controlled.  Continue current medications and low sodium Dash type diet.      Current medicines are reviewed at length with the patient today.   The patient does not have concerns regarding medicines.  The following changes have been made:  no change  Labs/ tests ordered today include: Myovue  No orders of the defined types were placed in this encounter.    Disposition:   FU with cardiology PRN      Signed, Jenkins Rouge, [name]  04/10/2018 11:41 AM    [facility] [place], [place], [place]  [address] Phone: [phone]; Fax: [phone]

## 2018-04-17 ENCOUNTER — Ambulatory Visit: Payer: BC Managed Care – PPO | Admitting: Cardiovascular Disease

## 2018-04-17 ENCOUNTER — Encounter: Payer: Self-pay | Admitting: Cardiovascular Disease

## 2018-04-17 VITALS — BP 140/90 | HR 83 | Ht 74.0 in | Wt >= 6400 oz

## 2018-04-17 DIAGNOSIS — I10 Essential (primary) hypertension: Secondary | ICD-10-CM

## 2018-04-17 DIAGNOSIS — K219 Gastro-esophageal reflux disease without esophagitis: Secondary | ICD-10-CM | POA: Diagnosis not present

## 2018-04-17 DIAGNOSIS — R079 Chest pain, unspecified: Secondary | ICD-10-CM | POA: Diagnosis not present

## 2018-04-17 NOTE — Progress Notes (Signed)
Cardiology Office Note    Date:  04/18/2018   ID:  [name] [name], DOB Nov 25, 1970, MRN [id number]  PCP:  [name], NP  Cardiologist:   Jenkins Rouge, MD   No chief complaint on file.     History of Present Illness: [name] [name] is a 48 y.o. male who presents for consultation regarding chest pain. 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 Abdominal pain 07/31/2014  . Abnormal CT scan, small bowel 12/28/2015  . Acute bronchitis 04/09/2013  . Acute conjunctivitis of both eyes 09/04/2017  . Acute pharyngitis 04/13/2013  . ADHD (attention deficit hyperactivity disorder) 10/21/2006   Qualifier: Diagnosis of  By: Maxie Better FNP, Rosalita Levan   . AKI (acute kidney injury) ([place]) 06/26/2017  . Alternating constipation and diarrhea 10/12/2015  . ANGER 10/31/2006   Qualifier: Diagnosis of  By: Maxie Better FNP, Rosalita Levan   . ANXIETY 11/14/2009   Qualifier: Diagnosis of  By: Deborra Medina MD, Tanja Port    . Asthma   . 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 Bronchitis   . Calculus of gallbladder with acute cholecystitis without obstruction 02/15/2015  . Chest pain 03/17/2018  . Chest wall pain 02/10/2015  . Cholelithiasis 02/13/2015  . Chronic bronchitis ([place])   .  Complication of anesthesia    usually wake up in the middle of procedure or surgery  . Conjunctivitis 10/18/2010  . Dehydration 06/26/2017  . DEPENDENT EDEMA 11/09/2008   Qualifier: Diagnosis of  By: Maxie Better FNP, Rosalita Levan   . Diarrhea 11/06/2016  . Enteritis 12/21/2015  . Erectile dysfunction 04/11/2017  . Gallstone 02/10/2015  . Gallstones   . GASTROENTERITIS WITHOUT DEHYDRATION 05/15/2010   Qualifier: Diagnosis of  By: Deborra Medina MD, Tanja Port    . GERD 10/21/2006   Qualifier: Diagnosis of  By: Maxie Better FNP, Rosalita Levan   . GERD (gastroesophageal reflux disease)   . Hepatic steatosis 05/31/2015  . History of colonic polyps 12/28/2015  . Hypertension   . Lumbar back sprain 01/28/2017  . Mole of skin 05/28/2012  . Morbid obesity ([place]) 07/30/2006   Qualifier: Diagnosis of  By: Maxie Better FNP, Rosalita Levan   . Nausea vomiting and diarrhea 07/31/2014  . Obesity   . OSA (obstructive sleep apnea) 07/30/2006   Qualifier: Diagnosis of  By: Maxie Better FNP, Rosalita Levan   . Partial small bowel obstruction ([place]) 12/14/2015  . Right knee pain 12/01/2012  . SBO (small bowel obstruction) ([place]) 06/26/2017  . Skin lesion 11/07/2014  . Skin tag 05/28/2012  . Sleep apnea    "suppose to wear mask but I don't" (05/31/2015)  . Small bowel obstruction ([place]) 05/31/2015  . Small bowel obstruction, partial ([place]) 12/13/2015  . Smoker 09/04/2017  . Viral gastroenteritis 03/23/2016  Past Surgical History:  Procedure Laterality Date  . CHOLECYSTECTOMY N/A 02/14/2015   Procedure: LAPAROSCOPIC CHOLECYSTECTOMY WITH INTRAOPERATIVE CHOLANGIOGRAM;  Surgeon: Coralie Keens, MD;  Location: [facility];  Service: General;  Laterality: N/A;  . COLONOSCOPY  10/16/2005   Polyps (tubular adenoma 2007), external hemorrhoids  . COLONOSCOPY WITH PROPOFOL N/A 01/05/2016   Procedure: COLONOSCOPY WITH PROPOFOL;  Surgeon: Milus Banister, MD;  [facility]: [facility];  Service: Endoscopy;  Laterality: N/A;  . FINGER SURGERY Left     reconstructive - 2 surgeries on hand  . LAPAROSCOPIC GASTRIC SLEEVE RESECTION  10/2010    at [facility]     Current Outpatient Medications  Medication Sig Dispense Refill  . albuterol (ACCUNEB) 0.63 MG/3ML nebulizer solution Take 3 mLs (0.63 mg total) by nebulization every 6 (six) hours as needed for wheezing or shortness of breath. 75 mL 3  . albuterol (PROAIR HFA) 108 (90 Base) MCG/ACT inhaler Inhale 2 puffs into the lungs every 6 (six) hours as needed for wheezing or shortness of breath. 1 Inhaler 3  . amLODipine (NORVASC) 10 MG tablet Take 1 tablet (10 mg total) by mouth daily. 90 tablet 0  . lisinopril (PRINIVIL,ZESTRIL) 10 MG tablet Take 1 tablet (10 mg total) by mouth daily. For blood pressure. 90 tablet 3  . omeprazole (PRILOSEC) 20 MG capsule Take 1 capsule (20 mg total) by mouth daily. 90 capsule 0  . ondansetron (ZOFRAN ODT) 4 MG disintegrating tablet Take 1 tablet (4 mg total) by mouth every 8 (eight) hours as needed for nausea or vomiting. 10 tablet 0  . sildenafil (REVATIO) 20 MG tablet Take 2-5 tablets by mouth 30 minutes prior to intercourse. 50 tablet 0   No current facility-administered medications for this visit.     Allergies:   Bee venom; Codeine; Hornet venom; and Penicillins    Social History:  The patient  reports that he quit smoking about 6 months ago. His smoking use included cigarettes. He started smoking about 18 months ago. He has a 20.00 pack-year smoking history. His smokeless tobacco use includes snuff. He reports current alcohol use of about 13.0 standard drinks of alcohol per week. He reports that he does not use drugs.   Family History:  The patient's family history includes Breast cancer in his maternal grandmother and mother; CAD in his father; Cancer in his father; Cirrhosis in his maternal grandmother; Clotting disorder in his father; Diabetes Mellitus II in his father and mother; Ovarian cancer in his mother; Stroke in his father.    ROS:  Please see the  history of present illness.   Otherwise, review of systems are positive for none.   All other systems are reviewed and negative.    PHYSICAL EXAM: VS:  BP 140/90 (BP Location: Left Arm, Patient Position: Sitting, Cuff Size: Large)   Pulse 83   Ht 6\' 2"  (1.88 m)   Wt (!) 412 lb (186.9 kg)   SpO2 99%   BMI 52.90 kg/m  , BMI Body mass index is 52.9 kg/m. Affect appropriate Healthy:  appears stated age 74: normal Neck supple with no adenopathy JVP normal no bruits no thyromegaly Lungs clear with no wheezing and good diaphragmatic motion Heart:  S1/S2 no murmur, no rub, gallop or click PMI normal Abdomen: benighn, BS positve, no tenderness, no AAA no bruit.  No HSM or HJR Distal pulses intact with no bruits No edema Neuro non-focal Skin warm and dry No muscular weakness    EKG:  03/14/18 SR LAD ICRBBB  pulmonary disease pattern    Recent Labs: 03/13/2018: Hemoglobin 16.0; Platelets 213 04/03/2018: ALT 33; BUN 13; Creatinine, Ser 0.94; Potassium 4.2; Sodium 134    Lipid Panel    Component Value Date/Time   CHOL 153 04/03/2018 1149   TRIG 104.0 04/03/2018 1149   TRIG 87 03/18/2006 0953   HDL 51.00 04/03/2018 1149   CHOLHDL 3 04/03/2018 1149   VLDL 20.8 04/03/2018 1149   LDLCALC 81 04/03/2018 1149      Wt Readings from Last 3 Encounters:  04/17/18 (!) 412 lb (186.9 kg)  04/03/18 (!) 412 lb (186.9 kg)  03/17/18 (!) 413 lb 12 oz (187.7 kg)      Other studies Reviewed: Additional studies/ records that were reviewed today include: Notes from ER, labs, CXR ECG notes from primary .    ASSESSMENT AND PLAN:  1.  Chest Pain:  Negative w/u in ER. Smoker with family history of CAD and hypertension f/u exercise myovue  2.  GERD:  Related to previous gastric sleeve and ETOH continue prilosec f/u primary May  Be etiology for atypical chest pain 3. Smoking counseled on cessation CXR 03/13/18 chronic bronchitis has inhaler  4. HTN:  Well controlled.  Continue current  medications and low sodium Dash type diet.      Current medicines are reviewed at length with the patient today.  The patient does not have concerns regarding medicines.  The following changes have been made:  no change  Labs/ tests ordered today include: Myvovue   Orders Placed This Encounter  Procedures  . MYOCARDIAL PERFUSION IMAGING     Disposition:   FU with cardiology PRN      Signed, Jenkins Rouge, MD  04/18/2018 10:35 AM    [facility] [place], [place], [place]  [address] Phone: [phone]; Fax: [phone]

## 2018-04-17 NOTE — Patient Instructions (Addendum)
Medication Instructions:   If you need a refill on your cardiac medications before your next appointment, please call your pharmacy.   Lab work:  If you have labs (blood work) drawn today and your tests are completely normal, you will receive your results only by: . MyChart Message (if you have MyChart) OR . A paper copy in the mail If you have any lab test that is abnormal or we need to change your treatment, we will call you to review the results.  Testing/Procedures: Your physician has requested that you have en exercise stress myoview. For further information please visit [email or web address]. Please follow instruction sheet, as given.  Follow-Up: At [facility], you and your health needs are our priority.  As part of our continuing mission to provide you with exceptional heart care, we have created designated Provider Care Teams.  These Care Teams include your primary Cardiologist (physician) and Advanced Practice Providers (APPs -  Physician Assistants and Nurse Practitioners) who all work together to provide you with the care you need, when you need it. Your physician recommends that you schedule a follow-up appointment as needed with Dr. Nishan.   

## 2018-04-29 ENCOUNTER — Ambulatory Visit: Payer: BC Managed Care – PPO | Admitting: Family Medicine

## 2018-04-29 ENCOUNTER — Encounter: Payer: Self-pay | Admitting: Family Medicine

## 2018-04-29 VITALS — BP 126/78 | HR 90 | Temp 98.0°F | Ht 74.0 in | Wt >= 6400 oz

## 2018-04-29 DIAGNOSIS — J029 Acute pharyngitis, unspecified: Secondary | ICD-10-CM | POA: Diagnosis not present

## 2018-04-29 DIAGNOSIS — J069 Acute upper respiratory infection, unspecified: Secondary | ICD-10-CM

## 2018-04-29 LAB — POCT RAPID STREP A (OFFICE): Rapid Strep A Screen: NEGATIVE

## 2018-04-29 NOTE — Progress Notes (Signed)
Subjective:    Patient ID: [name] [name], male    DOB: 02/14/1971, 48 y.o.   MRN: [id number]  HPI Here for ST and congestion   48 yo pt of NP Clark   Started yesterday am  His uvula was swollen and sore  Throat felt raw and hurt to swallow Now starting to get head congestion and fatigue   Clear nasal drainage  A little headache   No fever  Some body aches   Very little cough - clearing throat   Takes tylenol occasional   Has been exposed to a lot of viruses He declines flu shots   Results for orders placed or performed in visit on 04/29/18  Rapid Strep A  Result Value Ref Range   Rapid Strep A Screen Negative Negative      Patient Active Problem List   Diagnosis Date Noted  . Viral URI 04/29/2018  . Osteoarthritis 04/03/2018  . Preventative health care 04/03/2018  . Chest pain 03/17/2018  . Smoker 09/04/2017  . COPD (chronic obstructive pulmonary disease) ([place]) 06/26/2017  . Erectile dysfunction 04/11/2017  . History of colonic polyps 12/28/2015  . Hepatic steatosis 05/31/2015  . Essential hypertension 07/31/2014  . ANXIETY 11/14/2009  . DEPENDENT EDEMA 11/09/2008  . 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  . ADHD (attention deficit hyperactivity disorder) 10/21/2006  . GERD 10/21/2006  . Morbid obesity ([place]) 07/30/2006  . OSA (obstructive sleep apnea) 07/30/2006  . Hemorrhoids 10/03/2005   Past Medical History:  Diagnosis Date  . Abdominal pain 07/31/2014  . Abnormal CT scan, small bowel 12/28/2015  . Acute bronchitis 04/09/2013  . Acute conjunctivitis of both eyes 09/04/2017  . Acute pharyngitis 04/13/2013  . ADHD (attention deficit hyperactivity disorder) 10/21/2006   Qualifier: Diagnosis of  By: Maxie Better FNP, Rosalita Levan   . AKI (acute kidney injury) ([place]) 06/26/2017  . Alternating constipation and diarrhea 10/12/2015  . ANGER 10/31/2006   Qualifier: Diagnosis of  By: Maxie Better FNP, Rosalita Levan   . ANXIETY 11/14/2009   Qualifier:  Diagnosis of  By: Deborra Medina MD, Tanja Port    . Asthma   . 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 Bronchitis   . Calculus of gallbladder with acute cholecystitis without obstruction 02/15/2015  . Chest pain 03/17/2018  . Chest wall pain 02/10/2015  . Cholelithiasis 02/13/2015  . Chronic bronchitis ([place])   . Complication of anesthesia    usually wake up in the middle of procedure or surgery  . Conjunctivitis 10/18/2010  . Dehydration 06/26/2017  . DEPENDENT EDEMA 11/09/2008   Qualifier: Diagnosis of  By: Maxie Better FNP, Rosalita Levan   . Diarrhea 11/06/2016  . Enteritis 12/21/2015  . Erectile dysfunction 04/11/2017  . Gallstone 02/10/2015  . Gallstones   . GASTROENTERITIS WITHOUT DEHYDRATION 05/15/2010   Qualifier: Diagnosis of  By: Deborra Medina MD, Tanja Port    . GERD 10/21/2006   Qualifier: Diagnosis of  By: Maxie Better FNP, Rosalita Levan   . GERD (gastroesophageal reflux disease)   . Hepatic steatosis 05/31/2015  . History of colonic polyps 12/28/2015  . Hypertension   . Lumbar back sprain 01/28/2017  . Mole of skin 05/28/2012  . Morbid obesity ([place]) 07/30/2006   Qualifier: Diagnosis of  By: Maxie Better FNP, Rosalita Levan   . Nausea vomiting and diarrhea 07/31/2014  . Obesity   . OSA (obstructive sleep apnea) 07/30/2006   Qualifier: Diagnosis of  By: Maxie Better FNP, Rosalita Levan   .  Partial small bowel obstruction ([place]) 12/14/2015  . Right knee pain 12/01/2012  . SBO (small bowel obstruction) ([place]) 06/26/2017  . Skin lesion 11/07/2014  . Skin tag 05/28/2012  . Sleep apnea    "suppose to wear mask but I don't" (05/31/2015)  . Small bowel obstruction ([place]) 05/31/2015  . Small bowel obstruction, partial ([place]) 12/13/2015  . Smoker 09/04/2017  . Viral gastroenteritis 03/23/2016   Past Surgical History:  Procedure Laterality Date  . CHOLECYSTECTOMY N/A 02/14/2015   Procedure: LAPAROSCOPIC CHOLECYSTECTOMY WITH INTRAOPERATIVE CHOLANGIOGRAM;  Surgeon:  Coralie Keens, MD;  Location: [facility];  Service: General;  Laterality: N/A;  . COLONOSCOPY  10/16/2005   Polyps (tubular adenoma 2007), external hemorrhoids  . COLONOSCOPY WITH PROPOFOL N/A 01/05/2016   Procedure: COLONOSCOPY WITH PROPOFOL;  Surgeon: Milus Banister, MD;  Location: [facility];  Service: Endoscopy;  Laterality: N/A;  . FINGER SURGERY Left    reconstructive - 2 surgeries on hand  . LAPAROSCOPIC GASTRIC SLEEVE RESECTION  10/2010    at [facility]  . Smoking status: Former Smoker    Packs/day: 1.00    Years: 20.00    Pack years: 20.00    Types: Cigarettes    Start date: 10/03/2016    Last attempt to quit: 10/03/2017    Years since quitting: 0.5  . Smokeless tobacco: Current User    Types: Snuff  Substance Use Topics  . Alcohol use: Yes    Alcohol/week: 13.0 standard drinks    Types: 13 Shots of liquor per week    Comment: "a couple of shots a day"  . Drug use: No   Family History  Problem Relation Age of Onset  . Diabetes Mellitus II Mother   . Breast cancer Mother   . Ovarian cancer Mother   . Diabetes Mellitus II Father   . CAD Father   . Stroke Father   . Cancer Father        ?  . Clotting disorder Father        blood clots in brain with surgery  . Cirrhosis Maternal Grandmother   . Breast cancer Maternal Grandmother    Allergies  Allergen Reactions  . Bee Venom Anaphylaxis  . Codeine Anaphylaxis    Tolerates dilaudid  . Hornet Venom Anaphylaxis  . Penicillins Hives and Other (See Comments)    Has patient had a PCN reaction causing immediate rash, facial/tongue/throat swelling, SOB or lightheadedness with hypotensionYES Has patient had a PCN reaction causing severe rash involving mucus membranes or skin necrosis: NO Has patient had a PCN reaction that required hospitalization NO Has patient had a PCN reaction occurring within the last 10 years: NO If all of the above answers are "NO", then may proceed with Cephalosporin use.    Current Outpatient Medications on File Prior to Visit  Medication Sig Dispense Refill  . albuterol (ACCUNEB) 0.63 MG/3ML nebulizer solution Take 3 mLs (0.63 mg total) by nebulization every 6 (six) hours as needed for wheezing or shortness of breath. 75 mL 3  . albuterol (PROAIR HFA) 108 (90 Base) MCG/ACT inhaler Inhale 2 puffs into the lungs every 6 (six) hours as needed for wheezing or shortness of breath. 1 Inhaler 3  . amLODipine (NORVASC) 10 MG tablet Take 1 tablet (10 mg total) by mouth daily. 90 tablet 0  . lisinopril (PRINIVIL,ZESTRIL) 10 MG tablet Take 1 tablet (10 mg total) by mouth daily. For blood pressure. 90 tablet 3  . omeprazole (  PRILOSEC) 20 MG capsule Take 1 capsule (20 mg total) by mouth daily. 90 capsule 0  . ondansetron (ZOFRAN ODT) 4 MG disintegrating tablet Take 1 tablet (4 mg total) by mouth every 8 (eight) hours as needed for nausea or vomiting. 10 tablet 0  . sildenafil (REVATIO) 20 MG tablet Take 2-5 tablets by mouth 30 minutes prior to intercourse. 50 tablet 0   No current facility-administered medications on file prior to visit.      Review of Systems  Constitutional: Positive for appetite change and fatigue. Negative for fever.  HENT: Positive for congestion, postnasal drip, rhinorrhea, sinus pressure, sneezing and sore throat. Negative for ear pain.   Eyes: Negative for pain and discharge.  Respiratory: Positive for cough. Negative for chest tightness, shortness of breath, wheezing and stridor.   Cardiovascular: Negative for chest pain.  Gastrointestinal: Negative for diarrhea, nausea and vomiting.  Genitourinary: Negative for frequency, hematuria and urgency.  Musculoskeletal: Negative for arthralgias and myalgias.  Skin: Negative for rash.  Neurological: Positive for headaches. Negative for dizziness, weakness and light-headedness.  Psychiatric/Behavioral: Negative for confusion and dysphoric mood.       Objective:   Physical Exam Constitutional:       General: He is not in acute distress.    Appearance: Normal appearance. He is well-developed. He is obese. He is not ill-appearing, toxic-appearing or diaphoretic.  HENT:     Head: Normocephalic and atraumatic.     Comments: Nares are injected and congested      Right Ear: Tympanic membrane, ear canal and external ear normal.     Left Ear: Tympanic membrane, ear canal and external ear normal.     Nose: Congestion and rhinorrhea present.     Mouth/Throat:     Mouth: Mucous membranes are moist.     Pharynx: Oropharynx is clear. No oropharyngeal exudate or posterior oropharyngeal erythema.     Comments: Clear pnd  Eyes:     General:        Right eye: No discharge.        Left eye: No discharge.     Conjunctiva/sclera: Conjunctivae normal.     Pupils: Pupils are equal, round, and reactive to light.  Neck:     Musculoskeletal: Normal range of motion and neck supple.  Cardiovascular:     Rate and Rhythm: Normal rate.     Heart sounds: Normal heart sounds.  Pulmonary:     Effort: Pulmonary effort is normal. No respiratory distress.     Breath sounds: Normal breath sounds. No stridor. No wheezing, rhonchi or rales.     Comments: Diffusely distant bs  Good air exch No rales/rhonchi No wheeze even on forced exp Chest:     Chest wall: No tenderness.  Lymphadenopathy:     Cervical: No cervical adenopathy.  Skin:    General: Skin is warm and dry.     Capillary Refill: Capillary refill takes less than 2 seconds.     Findings: No rash.  Neurological:     Mental Status: He is alert.     Cranial Nerves: No cranial nerve deficit.  Psychiatric:        Mood and Affect: Mood normal.           Assessment & Plan:   Problem List Items Addressed This Visit      Respiratory   Viral URI - Primary    With sore throat- now beginning congestion/ sneezing Disc sympt care  Tylenol/analgesics and throat spray prn  expecorant (dm if cough) and antihistamine prn  Fluids/rest when able  In  light of smoking/copd - alert [place] if sob/wheezing Update if not starting to improve in a week or if worsening  (esp if fever)  rst neg today       Other Visit Diagnoses    Sore throat       Relevant Orders   Rapid Strep A (Completed)

## 2018-04-29 NOTE — Patient Instructions (Addendum)
I think you are starting a cold   Tylenol and salt water gargles are helpful for sore throat  Chloraseptic throat products help also   Drink lots of fluids   For post nasal drip- an antihistamine like zyrtec or allegra may help  For congestion - saline nasal spray and mucinex (or mucinex dm if coughing) may be helpful   Get extra rest when you can   Update if not starting to improve in a week or if worsening   Alert [place] if fever over 101 or shortness of breath

## 2018-04-29 NOTE — Assessment & Plan Note (Signed)
With sore throat- now beginning congestion/ sneezing Disc sympt care  Tylenol/analgesics and throat spray prn  expecorant (dm if cough) and antihistamine prn  Fluids/rest when able  In light of smoking/copd - alert [place] if sob/wheezing Update if not starting to improve in a week or if worsening  (esp if fever)  rst neg today

## 2018-05-01 ENCOUNTER — Telehealth: Payer: Self-pay | Admitting: Primary Care

## 2018-05-01 NOTE — Telephone Encounter (Signed)
Letter printed.

## 2018-05-01 NOTE — Telephone Encounter (Signed)
Pt notified letter ready for pick up. 

## 2018-05-01 NOTE — Telephone Encounter (Signed)
Patient saw Dr Glori Bickers on 04/29/2018. [place] for work note?

## 2018-05-01 NOTE — Telephone Encounter (Signed)
Pt stated he missed Wednesday and Thursday from work and want a note to go back Monday 3.2.20. pt stated he is off work Friday. Please call pt when note is ready.

## 2018-05-07 ENCOUNTER — Telehealth (HOSPITAL_COMMUNITY): Payer: Self-pay | Admitting: *Deleted

## 2018-05-07 NOTE — Telephone Encounter (Signed)
Left message on voicemail in reference to upcoming appointment scheduled for 05/12/18. Phone number given for a call back so details instructions can be given. Kirstie Peri, RN

## 2018-05-07 NOTE — Telephone Encounter (Signed)
Patient given detailed instructions per Myocardial Perfusion Study Information Sheet for the test on 05/12/18 at 1:00. Patient notified to arrive 15 minutes early and that it is imperative to arrive on time for appointment to keep from having the test rescheduled.  If you need to cancel or reschedule your appointment, please call the office within 24 hours of your appointment. . Patient verbalized understanding.[name] [name]

## 2018-05-12 ENCOUNTER — Ambulatory Visit (HOSPITAL_COMMUNITY): Payer: BC Managed Care – PPO | Attending: Cardiovascular Disease

## 2018-05-12 DIAGNOSIS — R079 Chest pain, unspecified: Secondary | ICD-10-CM | POA: Insufficient documentation

## 2018-05-12 MED ORDER — TECHNETIUM TC 99M TETROFOSMIN IV KIT
33.0000 | PACK | Freq: Once | INTRAVENOUS | Status: AC | PRN
  Administered 2018-05-12: 33 via INTRAVENOUS
  Filled 2018-05-12: qty 33

## 2018-05-13 ENCOUNTER — Ambulatory Visit (HOSPITAL_COMMUNITY): Payer: BC Managed Care – PPO | Attending: Cardiovascular Disease

## 2018-05-13 LAB — MYOCARDIAL PERFUSION IMAGING
CHL RATE OF PERCEIVED EXERTION: 19
Estimated workload: 7 METS
Exercise duration (min): 6 min
LV dias vol: 107 mL (ref 62–150)
LV sys vol: 39 mL
MPHR: 174 {beats}/min
Peak HR: 155 {beats}/min
Percent HR: 89 %
Rest HR: 78 {beats}/min
SDS: 0
SRS: 0
SSS: 0
TID: 0.84

## 2018-05-13 MED ORDER — TECHNETIUM TC 99M TETROFOSMIN IV KIT
32.6000 | PACK | Freq: Once | INTRAVENOUS | Status: AC | PRN
  Filled 2018-05-13: qty 33

## 2018-05-15 ENCOUNTER — Ambulatory Visit: Payer: BC Managed Care – PPO | Admitting: Family Medicine

## 2018-05-15 ENCOUNTER — Telehealth: Payer: Self-pay | Admitting: Cardiovascular Disease

## 2018-05-15 ENCOUNTER — Other Ambulatory Visit: Payer: Self-pay

## 2018-05-15 ENCOUNTER — Encounter: Payer: Self-pay | Admitting: Family Medicine

## 2018-05-15 VITALS — BP 160/98 | HR 93 | Temp 97.8°F | Resp 16 | Ht 74.0 in | Wt >= 6400 oz

## 2018-05-15 DIAGNOSIS — I10 Essential (primary) hypertension: Secondary | ICD-10-CM | POA: Diagnosis not present

## 2018-05-15 DIAGNOSIS — J0140 Acute pansinusitis, unspecified: Secondary | ICD-10-CM | POA: Diagnosis not present

## 2018-05-15 MED ORDER — DOXYCYCLINE HYCLATE 100 MG PO TABS: 100.0000 mg | ORAL_TABLET | Freq: Two times a day (BID) | ORAL | 0 refills | Status: AC

## 2018-05-15 NOTE — Progress Notes (Signed)
Subjective:     [name] [name] is a 48 y.o. male presenting for Cough and Nasal Congestion     Cough  This is a new problem. The current episode started in the past 7 days. The problem has been gradually worsening. The cough is productive of purulent sputum. Associated symptoms include chills, headaches, myalgias, nasal congestion, postnasal drip, rhinorrhea, a sore throat, shortness of breath and wheezing. Pertinent negatives include no ear congestion, ear pain, fever or heartburn.   Was sick the end of February and was getting better and then seemed to get worse  #HTN - taking his medication - lisinopril recently added  Review of Systems  Constitutional: Positive for chills. Negative for fever.  HENT: Positive for congestion, postnasal drip, rhinorrhea, sinus pressure and sore throat. Negative for ear pain.   Respiratory: Positive for cough, shortness of breath and wheezing.   Gastrointestinal: Negative for heartburn.  Musculoskeletal: Positive for myalgias.  Neurological: Positive for headaches.     Social History   Tobacco Use  Smoking Status Former Smoker  . Packs/day: 1.00  . Years: 20.00  . Pack years: 20.00  . Types: Cigarettes  . Start date: 10/03/2016  . Last attempt to quit: 10/03/2017  . Years since quitting: 0.6  Smokeless Tobacco Current User  . Types: Snuff        Objective:    BP Readings from Last 3 Encounters:  05/15/18 (!) 160/98  04/29/18 126/78  04/17/18 140/90   Wt Readings from Last 3 Encounters:  05/15/18 (!) 412 lb 4 oz (187 kg)  05/12/18 (!) 416 lb (188.7 kg)  04/29/18 (!) 416 lb 5 oz (188.8 kg)    BP (!) 160/98 (BP Location: Right Arm, Patient Position: Sitting)   Pulse 93   Temp 97.8 F (36.6 C) (Oral)   Resp 16   Ht 6\' 2"  (1.88 m)   Wt (!) 412 lb 4 oz (187 kg)   SpO2 96%   BMI 52.93 kg/m    Physical Exam Constitutional:      General: He is not in acute distress.    Appearance: He is well-developed. He is not  ill-appearing.  HENT:     Head: Normocephalic and atraumatic.     Right Ear: Tympanic membrane and ear canal normal.     Left Ear: Tympanic membrane and ear canal normal.     Nose: Mucosal edema and rhinorrhea present.     Right Sinus: No maxillary sinus tenderness or frontal sinus tenderness.     Left Sinus: No maxillary sinus tenderness or frontal sinus tenderness.     Mouth/Throat:     Pharynx: Uvula midline. Posterior oropharyngeal erythema present. No oropharyngeal exudate.     Tonsils: Swelling: 0 on the right. 0 on the left.  Neck:     Musculoskeletal: Neck supple.  Cardiovascular:     Rate and Rhythm: Normal rate and regular rhythm.     Heart sounds: No murmur.  Pulmonary:     Effort: Pulmonary effort is normal. No respiratory distress.     Breath sounds: Normal breath sounds.  Lymphadenopathy:     Cervical: No cervical adenopathy.  Skin:    General: Skin is warm and dry.     Capillary Refill: Capillary refill takes less than 2 seconds.  Neurological:     Mental Status: He is alert.           Assessment & Plan:   Problem List Items Addressed This Visit  Cardiovascular and Mediastinum   Essential hypertension    BP elevated again. Endorses taking medication and good control at previous appointments. Advised home monitoring next week and if elevated calling the clinic to discuss plan. Only on lisinopril 10 so could increase but given prior good control elected to defer to PCP       Other Visit Diagnoses    Acute non-recurrent pansinusitis    -  Primary   Relevant Medications   cetirizine (ZYRTEC) 10 MG tablet   doxycycline (VIBRA-TABS) 100 MG tablet     Symptomatic care   Return if symptoms worsen or fail to improve.  Lesleigh Noe, MD

## 2018-05-15 NOTE — Patient Instructions (Addendum)
   1. Drink plenty of fluids 2. Get lots of rest  Sinus Congestion 1) Saline spray - 4 times a day to loosen secretions 2) Flonase (Store Brand ok) - once daily 3) Over the counter congestion medications  Cough 1) Cough drops can be helpful 2) Nyquil (or nighttime cough medication) 3) Honey is proven to be one of the best cough medications   Sore Throat 1) Honey as above, cough drops 2) Ibuprofen or Aleve can be helpful 3) Salt water Gargles  #Hypertension - Check Blood Pressure  - if still high then return to see [name] - Less than 140/90  If you develop fevers (Temperature >100.4), chills, worsening symptoms or symptoms lasting longer than 10 days return to clinic.

## 2018-05-15 NOTE — Telephone Encounter (Signed)
Patient aware of results. Per Dr. Marlou Porch, Excellent, low risk stress test.  No ischemia. Patient verbalized understanding. Will send copy to patient's PCP.

## 2018-05-15 NOTE — Telephone Encounter (Signed)
° ° °  Please call patient with stress test results °

## 2018-05-15 NOTE — Assessment & Plan Note (Signed)
BP elevated again. Endorses taking medication and good control at previous appointments. Advised home monitoring next week and if elevated calling the clinic to discuss plan. Only on lisinopril 10 so could increase but given prior good control elected to defer to PCP

## 2018-05-20 ENCOUNTER — Telehealth: Payer: Self-pay | Admitting: Family Medicine

## 2018-05-20 NOTE — Telephone Encounter (Signed)
Pt came in office and saw Dr Einar Pheasant 3.12.20 and is still experiencing chest congestion, cough, no fever and slight headache. Pt want call back to nurse to see what he need to do because he's unable to go to work.

## 2018-05-20 NOTE — Telephone Encounter (Signed)
In light of the pandemic of coronavirus I do not recommend he return. Have him continue to work on antibiotics.  He can use albuterol inhaler as needed. The Mucinex will cause him to cough up any congestion in the chest, make sure to take with a full glass of water. If he's running fevers, feeling increased shortness of breath, no continued improvement then have him notify. Okay to remain out of work, if he needs work excuse then okay to excuse for the rest of this week.

## 2018-05-20 NOTE — Telephone Encounter (Signed)
Spoke with patient. Patient was seen on 05/15/2018 and was diagnosed with sinusitis and prescribed Doxy. Patient is still taking his antibiotic. Patient states he is about 35% better with some of his symptoms but not resolved. He still feels bad and afraid he is not going to be able to work with cough and congestion. He is having chest congestion, cough-slightly productive with yellowish/greenish phlegm, slight headache and some SOB but patient has COPD also. Cough is worse with activity. Sinus pressure is better some. No fever. Body aches are better somewhat. He has been taking also Mucinex DM, cold and sinus Tylenol. OTC medication does help while in his system but as medication wears out he feels bad again. Does patient need to come back in for re evaluation. CB: [phone].

## 2018-05-20 NOTE — Telephone Encounter (Addendum)
Spoken and notified patient of [name] Millers comments. Patient verbalized understanding.  Will call tomorrow and let us know regarding work note.

## 2018-05-22 NOTE — Telephone Encounter (Signed)
Best number [phone] Pt called stating his sinus congestion and cough is back and pt is having lower back pain now  He has taken all antibiotics and wanted to know what he needs to do  Not feeling any better  No fever that he knows of and had not traveled anywhere  [facility] [address]

## 2018-05-22 NOTE — Telephone Encounter (Signed)
A post infectious cough may last for several weeks to one month. He can try Flonase nasal spray and Zyrtec for sinus congestion and nasal symptoms. If he feels as though he's worse then have him come in for an appointment in the afternoon.

## 2018-05-23 ENCOUNTER — Encounter: Payer: Self-pay | Admitting: *Deleted

## 2018-05-23 ENCOUNTER — Telehealth: Payer: Self-pay | Admitting: Primary Care

## 2018-05-23 ENCOUNTER — Other Ambulatory Visit: Payer: Self-pay

## 2018-05-23 ENCOUNTER — Encounter: Payer: Self-pay | Admitting: Family Medicine

## 2018-05-23 ENCOUNTER — Ambulatory Visit: Payer: BC Managed Care – PPO | Admitting: Family Medicine

## 2018-05-23 VITALS — BP 126/74 | HR 103 | Temp 98.3°F | Ht 74.0 in | Wt >= 6400 oz

## 2018-05-23 DIAGNOSIS — R05 Cough: Secondary | ICD-10-CM

## 2018-05-23 DIAGNOSIS — R059 Cough, unspecified: Secondary | ICD-10-CM

## 2018-05-23 NOTE — Progress Notes (Signed)
Subjective:    Patient ID: [name] [name], male    DOB: 1970/11/26, 48 y.o.   MRN: [id number]  HPI   48 year old male  smoker, pt of [name], with history of COPD, presents for follow up cough.  Seen initially 04/29/2008 Dx with viral URI Got better. Symptoms returned on 3/8 He was treated on 05/15/18 with doxycycline for sinus infection.  Completed antibiotic course.. with no effect.  He continued to have cough, chills, fatigue, congestion, no sinus pressure or pain ( has gotten better), no ear pain. Body aches now. Some shortness of breath.  No fever, but has had chills.  He is feeling gradually better.  Mucinex does no t help much at night.  Asthma COPD triggered some.. wheezing some off and on. Social History /Family History/Past Medical History reviewed in detail and updated in EMR if needed. Blood pressure 126/74, pulse (!) 103, temperature 98.3 F (36.8 C), temperature source Oral, height 6\' 2"  (1.88 m), weight (!) 411 lb 12 oz (186.8 kg), SpO2 96 %.   Review of Systems  Constitutional: Positive for fatigue. Negative for fever.  HENT: Positive for congestion. Negative for ear pain.   Eyes: Negative for pain.  Respiratory: Positive for cough, shortness of breath and wheezing.   Cardiovascular: Negative for chest pain, palpitations and leg swelling.  Gastrointestinal: Negative for abdominal pain.  Genitourinary: Negative for dysuria.  Musculoskeletal: Negative for arthralgias.  Neurological: Negative for syncope, light-headedness and headaches.  Psychiatric/Behavioral: Negative for dysphoric mood.       Objective:   Physical Exam Constitutional:      General: He is not in acute distress.    Appearance: Normal appearance. He is well-developed. He is obese. He is not ill-appearing or toxic-appearing.  HENT:     Head: Normocephalic and atraumatic.     Right Ear: Hearing, tympanic membrane, ear canal and external ear normal. No tenderness. No foreign body.  Tympanic membrane is not retracted or bulging.     Left Ear: Hearing, tympanic membrane, ear canal and external ear normal. No tenderness. No foreign body. Tympanic membrane is not retracted or bulging.     Nose: Congestion present. No mucosal edema or rhinorrhea.     Right Sinus: No maxillary sinus tenderness or frontal sinus tenderness.     Left Sinus: No maxillary sinus tenderness or frontal sinus tenderness.     Mouth/Throat:     Dentition: Normal dentition. No dental caries.     Pharynx: Uvula midline. No oropharyngeal exudate.     Tonsils: No tonsillar abscesses.  Eyes:     General: Lids are normal. Lids are everted, no foreign bodies appreciated.     Conjunctiva/sclera: Conjunctivae normal.     Pupils: Pupils are equal, round, and reactive to light.  Neck:     Musculoskeletal: Normal range of motion and neck supple.     Thyroid: No thyroid mass or thyromegaly.     Vascular: No carotid bruit.     Trachea: Trachea and phonation normal.  Cardiovascular:     Rate and Rhythm: Normal rate and regular rhythm.     Pulses: Normal pulses.     Heart sounds: Normal heart sounds, S1 normal and S2 normal. No murmur. No gallop.   Pulmonary:     Effort: Pulmonary effort is normal. No respiratory distress.     Breath sounds: Normal breath sounds. No wheezing, rhonchi or rales.  Abdominal:     General: Bowel sounds are normal.  Palpations: Abdomen is soft.     Tenderness: There is no abdominal tenderness. There is no guarding or rebound.     Hernia: No hernia is present.  Skin:    General: Skin is warm and dry.     Findings: No rash.  Neurological:     Mental Status: He is alert.     Deep Tendon Reflexes: Reflexes are normal and symmetric.  Psychiatric:        Speech: Speech normal.        Behavior: Behavior normal.        Judgment: Judgment normal.      Orders Placed This Encounter  Procedures   POC Influenza A&B(BINAX/QUICKVUE)       Assessment & Plan:

## 2018-05-23 NOTE — Telephone Encounter (Signed)
Pt aware of kates comments wanted to make appointment

## 2018-05-23 NOTE — Patient Instructions (Addendum)
Remain out of work until 05/26/18. Rest, fluids. Mucinex DM twice daily. Call if shortness of breath worsening. Use albuterol as needed. Go to ER if severe respiratory symptoms.

## 2018-05-23 NOTE — Telephone Encounter (Signed)
Left message asking pt to call office  See if pt would like to phone visit with dr Diona Browner

## 2018-05-29 ENCOUNTER — Encounter: Payer: Self-pay | Admitting: Family Medicine

## 2018-05-29 DIAGNOSIS — R059 Cough, unspecified: Secondary | ICD-10-CM | POA: Insufficient documentation

## 2018-05-29 DIAGNOSIS — R05 Cough: Secondary | ICD-10-CM | POA: Insufficient documentation

## 2018-05-29 LAB — POC INFLUENZA A&B (BINAX/QUICKVUE)
Influenza A, POC: NEGATIVE
Influenza B, POC: NEGATIVE

## 2018-05-29 NOTE — Assessment & Plan Note (Signed)
Symptoms improving... most likely viral URI.Marland Kitchen no clear sign of bacterial infection.  neg flu test.  Low risk for COVID19 and cannot test...still COVID19 in differential.  Encourage continue time out of work unitl 14 from onset of illness and fever resolved.  Remain out of work until 05/26/18. Rest, fluids. Mucinex DM twice daily. Call if shortness of breath worsening. Use albuterol as needed.

## 2018-06-21 ENCOUNTER — Other Ambulatory Visit: Payer: Self-pay | Admitting: Family Medicine

## 2018-06-21 DIAGNOSIS — I10 Essential (primary) hypertension: Secondary | ICD-10-CM

## 2018-07-07 ENCOUNTER — Other Ambulatory Visit: Payer: Self-pay

## 2018-07-07 ENCOUNTER — Ambulatory Visit: Payer: BC Managed Care – PPO | Admitting: Family Medicine

## 2018-07-07 ENCOUNTER — Encounter: Payer: Self-pay | Admitting: Family Medicine

## 2018-07-07 VITALS — BP 160/100 | HR 96 | Temp 98.6°F | Ht 74.0 in | Wt >= 6400 oz

## 2018-07-07 DIAGNOSIS — M545 Low back pain, unspecified: Secondary | ICD-10-CM

## 2018-07-07 NOTE — Progress Notes (Signed)
[name] [name] T. [name] Colberg, MD Primary Care and Sports Medicine [facility] [place], [address] Phone: [phone]   FAX: [phone]  [name] [name] - 48 y.o. male   MRN [id number]   Date of Birth: [birth year]  Visit Date: 07/07/2018   PCP: [name] [name], NP   Referred by: [name] [name], NP  Chief Complaint  Patient presents with   Back Pain    bent down to pick up shoes on Friday   Subjective:   [name] [name] is a 48 y.o. very pleasant male patient who presents with the following: Back Pain  ongoing for approximately: 4 d The patient has had back pain before. The back pain is localized into the lumbar spine area. They also describe no radiculopathy.  Patient presents with acute back pain. Curator. Feels betterbut with a burn sensation.  Taken some OTC meds and took some muscle relaxants.  Feels better today.  Missed three days of note. Tylenol and Methocarbimol  Has been getting better.   No numbness or tingling. No bowel or bladder incontinence. No focal weakness. Prior interventions: none Physical therapy: No Chiropractic manipulations: No Acupuncture: No Osteopathic manipulation: No Heat or cold: Minimal effect  Past Medical History, Surgical History, Family History, Medications, Allergies have been reviewed and updated if relevant.  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   ADHD (attention deficit  hyperactivity disorder) 10/21/2006   GERD 10/21/2006   Morbid obesity ([place]) 07/30/2006   OSA (obstructive sleep apnea) 07/30/2006   Hemorrhoids 10/03/2005    Past Medical History:  Diagnosis Date   Abdominal pain 07/31/2014   Abnormal CT scan, small bowel 12/28/2015   Acute bronchitis 04/09/2013   Acute conjunctivitis of both eyes 09/04/2017   Acute pharyngitis 04/13/2013   ADHD (attention deficit hyperactivity disorder) 10/21/2006   Qualifier: Diagnosis of  By: Maxie Better FNP, Rosalita Levan    AKI (acute kidney injury) ([place]) 06/26/2017   Alternating constipation and diarrhea 10/12/2015   ANGER 10/31/2006   Qualifier: Diagnosis of  By: Maxie Better FNP, Rosalita Levan    ANXIETY 11/14/2009   Qualifier: Diagnosis of  By: Deborra Medina MD, Talia     Asthma    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   Qualifier: Diagnosis of  By: Maxie Better FNP, Billie-Lynn Daniels    Bronchitis    Calculus of gallbladder with acute cholecystitis without obstruction 02/15/2015   Chest pain 03/17/2018   Chest wall pain 02/10/2015   Cholelithiasis 02/13/2015   Chronic bronchitis ([place])    Complication of anesthesia    usually wake up in the middle of procedure or surgery   Conjunctivitis 10/18/2010   Dehydration 06/26/2017   DEPENDENT EDEMA 11/09/2008   Qualifier: Diagnosis of  By: Maxie Better FNP, Rosalita Levan    Diarrhea 11/06/2016   Enteritis 12/21/2015   Erectile dysfunction 04/11/2017   Gallstone 02/10/2015   Gallstones    GASTROENTERITIS WITHOUT DEHYDRATION 05/15/2010   Qualifier: Diagnosis of  By: Deborra Medina MD, Talia     GERD  10/21/2006   Qualifier: Diagnosis of  By: Maxie Better FNP, Rosalita Levan    GERD (gastroesophageal reflux disease)    Hepatic steatosis 05/31/2015   History of colonic polyps 12/28/2015   Hypertension    Lumbar back sprain 01/28/2017   Mole of skin 05/28/2012   Morbid obesity ([place]) 07/30/2006   Qualifier: Diagnosis of  By: Maxie Better FNP, Rosalita Levan    Nausea  vomiting and diarrhea 07/31/2014   Obesity    OSA (obstructive sleep apnea) 07/30/2006   Qualifier: Diagnosis of  By: Maxie Better FNP, Rosalita Levan    Partial small bowel obstruction ([place]) 12/14/2015   Right knee pain 12/01/2012   SBO (small bowel obstruction) ([place]) 06/26/2017   Skin lesion 11/07/2014   Skin tag 05/28/2012   Sleep apnea    "suppose to wear mask but I don't" (05/31/2015)   Small bowel obstruction ([place]) 05/31/2015   Small bowel obstruction, partial ([place]) 12/13/2015   Smoker 09/04/2017   Viral gastroenteritis 03/23/2016    Past Surgical History:  Procedure Laterality Date   CHOLECYSTECTOMY N/A 02/14/2015   Procedure: LAPAROSCOPIC CHOLECYSTECTOMY WITH INTRAOPERATIVE CHOLANGIOGRAM;  Surgeon: Coralie Keens, MD;  Location: [facility];  Service: General;  Laterality: N/A;   COLONOSCOPY  10/16/2005   Polyps (tubular adenoma 2007), external hemorrhoids   COLONOSCOPY WITH PROPOFOL N/A 01/05/2016   Procedure: COLONOSCOPY WITH PROPOFOL;  Surgeon: Milus Banister, MD;  Location: [facility];  Service: Endoscopy;  Laterality: N/A;   FINGER SURGERY Left    reconstructive - 2 surgeries on hand   LAPAROSCOPIC GASTRIC SLEEVE RESECTION  10/2010    at [facility] History   Socioeconomic History   Marital status: Legally Separated    Spouse name: Not on file   Number of children: 1   Years of education: Not on file   Highest education level: Not on file  Occupational History   Occupation: Publishing rights manager: [employer] resource strain: Not on file   Food insecurity:    Worry: Not on file    Inability: Not on file   Transportation needs:    Medical: Not on file    Non-medical: Not on file  Tobacco Use   Smoking status: Former Smoker    Packs/day: 1.00    Years: 20.00    Pack years: 20.00    Types: Cigarettes    Start date: 10/03/2016    Last attempt to quit: 10/03/2017    Years since quitting: 0.7   Smokeless  tobacco: Current User    Types: Snuff  Substance and Sexual Activity   Alcohol use: Yes    Alcohol/week: 13.0 standard drinks    Types: 13 Shots of liquor per week    Comment: "a couple of shots a day"   Drug use: No   Sexual activity: Yes  Lifestyle   Physical activity:    Days per week: Not on file    Minutes per session: Not on file   Stress: Not on file  Relationships   Social connections:    Talks on phone: Not on file    Gets together: Not on file    Attends religious service: Not on file    Active member of club or organization: Not on file    Attends meetings of clubs or organizations: Not on file    Relationship status: Not on file   Intimate partner violence:    Fear of current or ex partner: Not  on file    Emotionally abused: Not on file    Physically abused: Not on file    Forced sexual activity: Not on file  Other Topics Concern   Not on file  Social History Narrative   Not on file    Family History  Problem Relation Age of Onset   Diabetes Mellitus II Mother    Breast cancer Mother    Ovarian cancer Mother    Diabetes Mellitus II Father    CAD Father    Stroke Father    Cancer Father        ?   Clotting disorder Father        blood clots in brain with surgery   Cirrhosis Maternal Grandmother    Breast cancer Maternal Grandmother     Allergies  Allergen Reactions   Bee Venom Anaphylaxis   Codeine Anaphylaxis    Tolerates dilaudid   Hornet Venom Anaphylaxis   Penicillins Hives and Other (See Comments)    Has patient had a PCN reaction causing immediate rash, facial/tongue/throat swelling, SOB or lightheadedness with hypotensionYES Has patient had a PCN reaction causing severe rash involving mucus membranes or skin necrosis: NO Has patient had a PCN reaction that required hospitalization NO Has patient had a PCN reaction occurring within the last 10 years: NO If all of the above answers are "NO", then may proceed with  Cephalosporin use.    Medication list reviewed and updated in full in [place].  GEN: No fevers, chills. Nontoxic. Primarily MSK c/o today. MSK: Detailed in the HPI GI: tolerating PO intake without difficulty Neuro: As above  Otherwise the pertinent positives of the ROS are noted above.    Objective:   Blood pressure (!) 160/100, pulse 96, temperature 98.6 F (37 C), temperature source Oral, height 6\' 2"  (1.88 m), weight (!) 408 lb 12 oz (185.4 kg).  Gen: Well-developed,well-nourished,in no acute distress; alert,appropriate and cooperative throughout examination HEENT: Normocephalic and atraumatic without obvious abnormalities.  Ears, externally no deformities Pulm: Breathing comfortably in no respiratory distress Range of motion at  the waist: Flexion, rotation and lateral bending: pain with returning to extension from flexion, all other ROM WNL  No echymosis or edema Rises to examination table with no difficulty Gait: minimally antalgic  Inspection/Deformity: No abnormality Paraspinus T:  l4-s1 ttp  B Ankle Dorsiflexion (L5,4): 5/5 B Great Toe Dorsiflexion (L5,4): 5/5 Heel Walk (L5): WNL Toe Walk (S1): WNL Rise/Squat (L4): WNL, mild pain  SENSORY B Medial Foot (L4): WNL B Dorsum (L5): WNL B Lateral (S1): WNL Light Touch: WNL Pinprick: WNL  REFLEXES Knee (L4): 2+ Ankle (S1): 2+  B SLR, seated: neg B SLR, supine: neg B FABER: neg B Reverse FABER: neg B Greater Troch: NT B Log Roll: neg B Stork: NT B Sciatic Notch: NT  Radiology: No results found.  Assessment and Plan:   Acute bilateral low back pain without sciatica  >25 minutes spent in face to face time with patient, >50% spent in counselling or coordination of care   Acute muscle injury Anatomy reviewed. 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 No red flags are present.  I appreciate the opportunity to evaluate this very friendly patient. If you have any question regarding his care or prognosis, do not hesitate to ask.  Follow-up: No follow-ups on file.  Signed,  Janiaya Ryser T. Tian Davison, MD   Allergies as of 07/07/2018      Reactions   Bee Venom Anaphylaxis   Codeine Anaphylaxis   Tolerates dilaudid   Hornet Venom Anaphylaxis   Penicillins Hives, Other (See Comments)   Has patient had a PCN reaction causing immediate rash, facial/tongue/throat swelling, SOB or lightheadedness with hypotensionYES Has patient had a PCN reaction causing severe rash involving mucus membranes or skin necrosis: NO Has patient had a PCN reaction that required hospitalization NO Has patient had a PCN reaction occurring within the last 10 years: NO If all of the above answers are "NO", then may proceed with Cephalosporin use.      Medication List       Accurate as of Jul 07, 2018 11:29 AM. Always use your most recent med list.        albuterol 108 (90 Base) MCG/ACT inhaler Commonly known as:  ProAir HFA Inhale 2 puffs into the lungs every 6 (six) hours as needed for wheezing or shortness of breath.   albuterol 0.63 MG/3ML nebulizer solution Commonly known as:  ACCUNEB Take 3 mLs (0.63 mg total) by nebulization every 6 (six) hours as needed for wheezing or shortness of breath.   amLODipine 10 MG tablet Commonly known as:  NORVASC Take 1 tablet by mouth once daily   cetirizine 10 MG tablet Commonly known as:  ZYRTEC Take 10 mg by mouth daily.   lisinopril 10 MG tablet Commonly known as:  ZESTRIL Take 1 tablet (10 mg total) by mouth daily. For blood pressure.   omeprazole 20 MG capsule Commonly known as:  PRILOSEC Take 1 capsule (20 mg total) by mouth daily.   sildenafil 20 MG tablet Commonly known as:  REVATIO Take 2-5 tablets by mouth 30 minutes prior to intercourse.

## 2018-07-10 ENCOUNTER — Telehealth: Payer: Self-pay | Admitting: Primary Care

## 2018-07-10 NOTE — Telephone Encounter (Signed)
Extended work note written.  [name] [name] notified by telephone that letter is ready to be picked up at the front desk.

## 2018-07-10 NOTE — Telephone Encounter (Signed)
Yes, very reasonable

## 2018-07-10 NOTE — Telephone Encounter (Signed)
Ok to extend work note 

## 2018-07-10 NOTE — Telephone Encounter (Signed)
Best number [phone]  Pt called stating he needs his work note to be extended to  Return to work on 07/12/2018 Please advise when ready for pick  Pt stated yesterday he had problems getting out of bed and he is doing a little better today

## 2018-07-30 ENCOUNTER — Encounter: Payer: Self-pay | Admitting: Primary Care

## 2018-07-30 ENCOUNTER — Ambulatory Visit (INDEPENDENT_AMBULATORY_CARE_PROVIDER_SITE_OTHER): Payer: BC Managed Care – PPO | Admitting: Primary Care

## 2018-07-30 VITALS — Wt >= 6400 oz

## 2018-07-30 DIAGNOSIS — R197 Diarrhea, unspecified: Secondary | ICD-10-CM

## 2018-07-30 NOTE — Progress Notes (Signed)
Subjective:    Patient ID: [name] [name], male    DOB: 1970/07/29, 48 y.o.   MRN: [id number]  HPI  Virtual Visit via Video Note  I connected with [name] on 07/30/18 at 10:40 AM EDT by a video enabled telemedicine application and verified that I am speaking with the correct person using two identifiers.  Location: Patient: Home Provider: Office   I discussed the limitations of evaluation and management by telemedicine and the availability of in person appointments. The patient expressed understanding and agreed to proceed.  History of Present Illness:  [name] [name] is a 48 year old male who presents today with a chief complaint of diarrhea.  His symptoms began two days ago with nausea, headache, fatigue, cold sweats, one episode of vomiting, and persistent diarrhea. His nausea, fatigue, cold sweats, and headache improved yesterday, diarrhea persisted. He was having diarrhea every 20-30 minutes yesterday. Today he's feeling moderately better with improvement in diarrhea which is now occurring once an hour, he has no other symptoms. He is drinking plenty of water and juice, also eating some today.   He was exposed to Covid-19 through work about 6 weeks ago, one of his co-workers passed away from [place] one month ago. Nearly 25 people within his occupation have tested positive for Covid-19. He thinks that he may have had Covid-19 about one month ago after some significant respiratory symptoms, he did quarantine for 14 days and recovered.   He denies fevers, shortness of breath, fatigue, chills, abdominal pain, bloody stools, nausea/vomiting now. He has not been to work in five days due to symptoms. He missed work on Monday and Tuesday this week and is needing a work excuse. He plans on returning Friday this week if his diarrhea has improved.    Observations/Objective:  Alert and oriented. Appears well, not sickly. No distress. Speaking in complete sentences.    Assessment and Plan:  Symptoms more representative of viral GI involvement, but cannot exclude Covid-19 given exposure from work. It is possible that he's already had Covid-19 from one month ago, he did quarantine and recover.  Will have him remain home for today and tomorrow. He has improved moderately and quickly from symptom onset which leads me to believe he has more viral GI cause for symptoms rather than Covid-19. He also has no other symptoms now except for diarrhea.   Discussed to work on hydration and intake of food over the next 24 hours. Avoid Imodium at this point.  If his diarrhea doesn't continue to improve then we will plan to have him in for lab testing. I did recommend he mention these symptoms to his employer. He will call with an update tomorrow.  Follow Up Instructions:  Continue to work on hydration with water and low calorie Gatorade.  Advance your diet as tolerated.   Please update me tomorrow regarding your symptoms.  It was a pleasure to see you today! [name], NP-C    I discussed the assessment and treatment plan with the patient. The patient was provided an opportunity to ask questions and all were answered. The patient agreed with the plan and demonstrated an understanding of the instructions.   The patient was advised to call back or seek an in-person evaluation if the symptoms worsen or if the condition fails to improve as anticipated.     [name], NP    Review of Systems  Constitutional: Negative for chills, fatigue and fever.  Respiratory: Negative for shortness of breath.  Cardiovascular: Negative for chest pain.  Gastrointestinal: Positive for diarrhea. Negative for abdominal pain, blood in stool, nausea and vomiting.  Neurological: Negative for headaches.       Past Medical History:  Diagnosis Date  . Abdominal pain 07/31/2014  . Abnormal CT scan, small bowel 12/28/2015  . Acute bronchitis 04/09/2013  . Acute conjunctivitis of  both eyes 09/04/2017  . Acute pharyngitis 04/13/2013  . ADHD (attention deficit hyperactivity disorder) 10/21/2006   Qualifier: Diagnosis of  By: Maxie Better FNP, Rosalita Levan   . AKI (acute kidney injury) ([place]) 06/26/2017  . Alternating constipation and diarrhea 10/12/2015  . ANGER 10/31/2006   Qualifier: Diagnosis of  By: Maxie Better FNP, Rosalita Levan   . ANXIETY 11/14/2009   Qualifier: Diagnosis of  By: Deborra Medina MD, Tanja Port    . Asthma   . 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 Bronchitis   . Calculus of gallbladder with acute cholecystitis without obstruction 02/15/2015  . Chest pain 03/17/2018  . Chest wall pain 02/10/2015  . Cholelithiasis 02/13/2015  . Chronic bronchitis ([place])   . Complication of anesthesia    usually wake up in the middle of procedure or surgery  . Conjunctivitis 10/18/2010  . Dehydration 06/26/2017  . DEPENDENT EDEMA 11/09/2008   Qualifier: Diagnosis of  By: Maxie Better FNP, Rosalita Levan   . Diarrhea 11/06/2016  . Enteritis 12/21/2015  . Erectile dysfunction 04/11/2017  . Gallstone 02/10/2015  . Gallstones   . GASTROENTERITIS WITHOUT DEHYDRATION 05/15/2010   Qualifier: Diagnosis of  By: Deborra Medina MD, Tanja Port    . GERD 10/21/2006   Qualifier: Diagnosis of  By: Maxie Better FNP, Rosalita Levan   . GERD (gastroesophageal reflux disease)   . Hepatic steatosis 05/31/2015  . History of colonic polyps 12/28/2015  . Hypertension   . Lumbar back sprain 01/28/2017  . Mole of skin 05/28/2012  . Morbid obesity ([place]) 07/30/2006   Qualifier: Diagnosis of  By: Maxie Better FNP, Rosalita Levan   . Nausea vomiting and diarrhea 07/31/2014  . Obesity   . OSA (obstructive sleep apnea) 07/30/2006   Qualifier: Diagnosis of  By: Maxie Better FNP, Rosalita Levan   . Partial small bowel obstruction ([place]) 12/14/2015  . Right knee pain 12/01/2012  . SBO (small bowel obstruction) ([place]) 06/26/2017  . Skin lesion 11/07/2014  . Skin tag 05/28/2012  .  Sleep apnea    "suppose to wear mask but I don't" (05/31/2015)  . Small bowel obstruction ([place]) 05/31/2015  . Small bowel obstruction, partial ([place]) 12/13/2015  . Smoker 09/04/2017  . Viral gastroenteritis 03/23/2016     Social History   Socioeconomic History  . Marital status: Legally Separated    Spouse name: Not on file  . Number of children: 1  . Years of education: Not on file  . Highest education level: Not on file  Occupational History  . Occupation: Publishing rights manager: [employer]  Social Needs  . Financial resource strain: Not on file  . Food insecurity:    Worry: Not on file    Inability: Not on file  . Transportation needs:    Medical: Not on file    Non-medical: Not on file  Tobacco Use  . Smoking status: Former Smoker    Packs/day: 1.00    Years: 20.00    Pack years: 20.00    Types: Cigarettes    Start date: 10/03/2016    Last attempt to quit: 10/03/2017  Years since quitting: 0.8  . Smokeless tobacco: Current User    Types: Snuff  Substance and Sexual Activity  . Alcohol use: Yes    Alcohol/week: 13.0 standard drinks    Types: 13 Shots of liquor per week    Comment: "a couple of shots a day"  . Drug use: No  . Sexual activity: Yes  Lifestyle  . Physical activity:    Days per week: Not on file    Minutes per session: Not on file  . Stress: Not on file  Relationships  . Social connections:    Talks on phone: Not on file    Gets together: Not on file    Attends religious service: Not on file    Active member of club or organization: Not on file    Attends meetings of clubs or organizations: Not on file    Relationship status: Not on file  . Intimate partner violence:    Fear of current or ex partner: Not on file    Emotionally abused: Not on file    Physically abused: Not on file    Forced sexual activity: Not on file  Other Topics Concern  . Not on file  Social History Narrative  . Not on file    Past Surgical History:   Procedure Laterality Date  . CHOLECYSTECTOMY N/A 02/14/2015   Procedure: LAPAROSCOPIC CHOLECYSTECTOMY WITH INTRAOPERATIVE CHOLANGIOGRAM;  Surgeon: Coralie Keens, MD;  Location: [facility];  Service: General;  Laterality: N/A;  . COLONOSCOPY  10/16/2005   Polyps (tubular adenoma 2007), external hemorrhoids  . COLONOSCOPY WITH PROPOFOL N/A 01/05/2016   Procedure: COLONOSCOPY WITH PROPOFOL;  Surgeon: Milus Banister, MD;  Location: [facility];  Service: Endoscopy;  Laterality: N/A;  . FINGER SURGERY Left    reconstructive - 2 surgeries on hand  . LAPAROSCOPIC GASTRIC SLEEVE RESECTION  10/2010    at [facility] History  Problem Relation Age of Onset  . Diabetes Mellitus II Mother   . Breast cancer Mother   . Ovarian cancer Mother   . Diabetes Mellitus II Father   . CAD Father   . Stroke Father   . Cancer Father        ?  . Clotting disorder Father        blood clots in brain with surgery  . Cirrhosis Maternal Grandmother   . Breast cancer Maternal Grandmother     Allergies  Allergen Reactions  . Bee Venom Anaphylaxis  . Codeine Anaphylaxis    Tolerates dilaudid  . Hornet Venom Anaphylaxis  . Penicillins Hives and Other (See Comments)    Has patient had a PCN reaction causing immediate rash, facial/tongue/throat swelling, SOB or lightheadedness with hypotensionYES Has patient had a PCN reaction causing severe rash involving mucus membranes or skin necrosis: NO Has patient had a PCN reaction that required hospitalization NO Has patient had a PCN reaction occurring within the last 10 years: NO If all of the above answers are "NO", then may proceed with Cephalosporin use.    Current Outpatient Medications on File Prior to Visit  Medication Sig Dispense Refill  . albuterol (ACCUNEB) 0.63 MG/3ML nebulizer solution Take 3 mLs (0.63 mg total) by nebulization every 6 (six) hours as needed for wheezing or shortness of breath. 75 mL 3  . albuterol (PROAIR HFA) 108 (90 Base) MCG/ACT  inhaler Inhale 2 puffs into the lungs every 6 (six) hours as needed for wheezing or shortness of breath. 1 Inhaler 3  .  amLODipine (NORVASC) 10 MG tablet Take 1 tablet by mouth once daily 90 tablet 2  . cetirizine (ZYRTEC) 10 MG tablet Take 10 mg by mouth daily.    Marland Kitchen lisinopril (PRINIVIL,ZESTRIL) 10 MG tablet Take 1 tablet (10 mg total) by mouth daily. For blood pressure. 90 tablet 3  . omeprazole (PRILOSEC) 20 MG capsule Take 1 capsule (20 mg total) by mouth daily. 90 capsule 0  . sildenafil (REVATIO) 20 MG tablet Take 2-5 tablets by mouth 30 minutes prior to intercourse. 50 tablet 0   Current Facility-Administered Medications on File Prior to Visit  Medication Dose Route Frequency Provider Last Rate Last Dose  . technetium tetrofosmin (TC-MYOVIEW) injection 16.1 millicurie  09.6 millicurie Intravenous Once PRN Nahser, Wonda Cheng, MD        Wt (!) 411 lb (186.4 kg)   BMI 52.77 kg/m    Objective:   Physical Exam  Constitutional: He is oriented to person, place, and time. He appears well-nourished. He does not have a sickly appearance. He does not appear ill.  Respiratory: Effort normal.  Neurological: He is alert and oriented to person, place, and time.  Psychiatric: He has a normal mood and affect.           Assessment & Plan:

## 2018-07-30 NOTE — Assessment & Plan Note (Signed)
Present for the last 48 hours, improving now. No other symptoms.   Symptoms more representative of viral GI involvement, but cannot exclude Covid-19 given exposure from work. It is possible that he's already had Covid-19 from one month ago, he did quarantine and recover.  Will have him remain home for today and tomorrow. He has improved moderately and quickly from symptom onset which leads me to believe he has more viral GI cause for symptoms rather than Covid-19. He also has no other symptoms now except for diarrhea.   Discussed to work on hydration and intake of food over the next 24 hours. Avoid Imodium at this point.  If his diarrhea doesn't continue to improve then we will plan to have him in for lab testing. I did recommend he mention these symptoms to his employer. He will call with an update tomorrow.

## 2018-07-30 NOTE — Patient Instructions (Signed)
Continue to work on hydration with water and low calorie Gatorade.  Advance your diet as tolerated.   Please update me tomorrow regarding your symptoms.  It was a pleasure to see you today! [name], NP-C

## 2018-07-31 ENCOUNTER — Telehealth: Payer: Self-pay | Admitting: Primary Care

## 2018-07-31 ENCOUNTER — Encounter: Payer: Self-pay | Admitting: Primary Care

## 2018-07-31 NOTE — Telephone Encounter (Signed)
Noted, will complete letter and place in Chan's inbox.

## 2018-07-31 NOTE — Telephone Encounter (Signed)
Spoken to patient and notified letter is ready. Left in the front office.

## 2018-07-31 NOTE — Telephone Encounter (Signed)
Patient had a virtual visit with [name] yesterday.  [name] asked patient to call back and let her know when he felt like he could go back to work. Patient said the work note can be written for patient to be off from 07/28/18 and return to work on 08/01/18. Please call patient when work note is ready.

## 2018-08-22 ENCOUNTER — Other Ambulatory Visit: Payer: Self-pay | Admitting: Family Medicine

## 2018-08-22 DIAGNOSIS — K219 Gastro-esophageal reflux disease without esophagitis: Secondary | ICD-10-CM

## 2018-09-02 ENCOUNTER — Telehealth: Payer: Self-pay

## 2018-09-02 ENCOUNTER — Ambulatory Visit (INDEPENDENT_AMBULATORY_CARE_PROVIDER_SITE_OTHER): Payer: BC Managed Care – PPO | Admitting: Family Medicine

## 2018-09-02 ENCOUNTER — Encounter: Payer: Self-pay | Admitting: Family Medicine

## 2018-09-02 DIAGNOSIS — Z20822 Contact with and (suspected) exposure to covid-19: Secondary | ICD-10-CM

## 2018-09-02 DIAGNOSIS — Z20828 Contact with and (suspected) exposure to other viral communicable diseases: Secondary | ICD-10-CM | POA: Diagnosis not present

## 2018-09-02 DIAGNOSIS — R6889 Other general symptoms and signs: Secondary | ICD-10-CM | POA: Diagnosis not present

## 2018-09-02 NOTE — Telephone Encounter (Signed)
Pt already has virtual appt scheduled 09/02/18 at 2 PM with Dr Einar Pheasant.

## 2018-09-02 NOTE — Progress Notes (Signed)
I connected with [name] [name] on 09/02/18 at  2:00 PM EDT by video and verified that I am speaking with the correct person using two identifiers.   I discussed the limitations, risks, security and privacy concerns of performing an evaluation and management service by video and the availability of in person appointments. I also discussed with the patient that there may be a patient responsible charge related to this service. The patient expressed understanding and agreed to proceed.  Patient location: Home Provider Location: [place] Participants: [name] [name] and [name] [name]   Subjective:     [name] [name] is a 48 y.o. male presenting for Chills (started yesterday-09/01/2018. Headache, body ache, congestion. 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 Negative for fever.  HENT: Positive for congestion. Negative for sore throat.   Respiratory: Positive for shortness of breath (chronic). Negative for cough.   Gastrointestinal: Positive for diarrhea. Negative for nausea and vomiting.     Social History   Tobacco Use  Smoking Status Former Smoker  . Packs/day: 1.00  . Years: 20.00  . Pack years: 20.00  . Types: Cigarettes  . Start date: 10/03/2016  . Quit date: 10/03/2017  . Years since quitting: 0.9  Smokeless Tobacco Current User  . Types: Snuff        Objective:   BP Readings from Last 3  Encounters:  07/07/18 (!) 160/100  05/23/18 126/74  05/15/18 (!) 160/98   Wt Readings from Last 3 Encounters:  07/30/18 (!) 411 lb (186.4 kg)  07/07/18 (!) 408 lb 12 oz (185.4 kg)  05/23/18 (!) 411 lb 12 oz (186.8 kg)    Physical Exam Constitutional:      Appearance: Normal appearance. He is not ill-appearing.     Comments: Lying in bed  HENT:     Head: Normocephalic and atraumatic.     Right Ear: External ear normal.     Left Ear: External ear normal.  Eyes:     Conjunctiva/sclera: Conjunctivae normal.  Pulmonary:     Effort: Pulmonary effort is normal. No respiratory distress.  Neurological:     Mental Status: He is alert. Mental status is at baseline.  Psychiatric:        Mood and Affect: Mood normal.        Behavior: Behavior normal.        Thought Content: Thought content normal.        Judgment: Judgment normal.           Assessment & Plan:   Problem List Items Addressed This Visit    None    Visit Diagnoses    Suspected Covid-19 Virus Infection    -  Primary     Given work exposures and new symptoms most likely Return/ER precautions discussed Tylenol/ibuprofen for symptoms Work note provided Referred for testing and  discussed isolation  Return if symptoms worsen or fail to improve.  [name] Noe, MD

## 2018-09-02 NOTE — Telephone Encounter (Addendum)
Patient called and advised of the request for covid testing, patient verbalized understanding. Appointment scheduled for tomorrow, 09/03/18 at 0930 at [facility], advised of location and to wear a mask for everyone in the vehicle, he verbalized understanding. Order placed.   ----- Message from Lesleigh Noe, MD sent at 09/02/2018  2:12 PM EDT ----- Works in prison system with exposures  No with chills, SOB, body aches

## 2018-09-02 NOTE — Telephone Encounter (Signed)
[place] Night - Client TELEPHONE ADVICE RECORD [facility] Patient Name: [name] [name] Gender: Male DOB: Dec 03, 1970 Age: 48 Y 36 M 9 D Return Phone Number: [phone] (Primary) Address: City/State/Zip: [place] [address] Client [facility] Night - Client Client Site [place] Physician Alma Friendly - NP Contact Type Call Who Is Calling Patient / Member / Family / Caregiver Call Type Triage / Clinical Relationship To Patient Self Return Phone Number [phone] (Primary) Chief Complaint Headache Reason for Call Symptomatic / Request for [place] states he has been freezing and shaking badly. He has had a bad headache. Translation No Nurse Assessment Nurse: Chestine Spore, RN, [place] Date/Time (Eastern Time): 09/02/2018 7:10:51 AM Confirm and document reason for call. If symptomatic, describe symptoms. ---caller states having a headache. chills. shivering on and off. cough. NO SOB. 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 * If Asymptomatic, screen for exposure and travel within the last 14 days. ---Yes Does the patient have any new or worsening symptoms? ---Yes Will a triage be completed? ---Yes Related visit to physician within the last 2 weeks? ---No Does the PT have any chronic conditions? (i.e. diabetes, asthma, this includes High risk factors for pregnancy, etc.) ---Yes List chronic conditions. ---hypertension Is this a behavioral health or substance abuse call? ---No Guidelines Guideline Title Affirmed Question Affirmed Notes Nurse Date/Time (Eastern Time) Coronavirus (COVID-19) - Diagnosed or  Suspected [1] COVID-19 infection suspected by caller or triager AND [2] mild symptoms (cough, fever, or others) AND [1] no complications or SOB Matherly, RN, [place] 09/02/2018 7:15:27 AM PLEASE NOTE: All timestamps contained within this report are represented as [place] Standard Time. CONFIDENTIALTY NOTICE: This fax transmission is intended only for the addressee. It contains information that is legally privileged, confidential or otherwise protected from use or disclosure. If you are not the intended recipient, you are strictly prohibited from reviewing, disclosing, copying using or disseminating any of this information or taking any action in reliance on or regarding this information. If you have received this fax in error, please notify us immediately by telephone so that we can arrange for its return to [place]. Phone: [phone], Toll-Free: [phone], Fax: [phone] Page: 2 of 2 Call Id: [id number] [place]. Time Eilene Ghazi Time) Disposition Final User 09/02/2018 7:20:27 AM Call PCP when Office is Open Yes Chestine Spore, RN, Silvestre Moment Disagree/Comply Comply Caller Understands Yes PreDisposition Call Doctor Care Advice Given Per Guideline CALL PCP WHEN OFFICE IS OPEN: * You need to discuss this with your doctor (or NP/PA) within the next few days. PAIN OR FEVER MEDICINES: HOW TO PROTECT OTHERS - WHEN YOU ARE SICK WITH COVID-19: * STAY HOME: Stay home from school or work if you are sick. Do NOT go to religious services, child care centers, shopping, or other public places. Do NOT use public transportation (e.g., bus, taxis, ride-sharing). Do NOT allow any visitors to your home. Leave the house only if you need to seek urgent medical care. CALL BACK IF: * Fever over 103 F (39.4 C) * Fever lasts over 3 days * Fever returns after being gone for 24 hours * Chest pain or difficulty breathing occurs * You become worse. CARE ADVICE given per CORONAVIRUS (  COVID-19) - DIAGNOSED OR SUSPECTED  (Adult) guideline. GENERAL CARE ADVICE FOR COVID-19 SYMPTOMS: * Fever: For fever over 101 F (38.3 C), take acetaminophen every 4-6 hours (Adults 650 mg) OR ibuprofen every 6-8 hours (Adults 400 mg). Before taking any medicine, read all the instructions on the package. * COVER THE COUGH: Cough and sneeze into your shirt sleeve or inner elbow. Don't cough into your hand or the air. If available, cough into a tissue and throw it into a trash can. * [place] HANDS OFTEN: Wash hands often with soap and water. After coughing or sneezing are important times. * WEAR A MASK: Wear a facemask when around others. Always wear a facemask (if available) if you have to leave your home (such as going to a medical facility). Referrals REFERRED TO PCP OFFICE

## 2018-09-03 ENCOUNTER — Other Ambulatory Visit: Payer: Self-pay

## 2018-09-03 DIAGNOSIS — Z20822 Contact with and (suspected) exposure to covid-19: Secondary | ICD-10-CM

## 2018-09-03 NOTE — Addendum Note (Signed)
Addended by: Denman George on: 09/03/2018 08:41 AM   Modules accepted: Orders

## 2018-09-06 ENCOUNTER — Other Ambulatory Visit: Payer: Self-pay | Admitting: Family Medicine

## 2018-09-08 ENCOUNTER — Encounter: Payer: Self-pay | Admitting: Family Medicine

## 2018-09-08 ENCOUNTER — Telehealth: Payer: Self-pay

## 2018-09-08 LAB — NOVEL CORONAVIRUS, NAA: SARS-CoV-2, NAA: NOT DETECTED

## 2018-09-08 NOTE — Telephone Encounter (Signed)
Noted. Note and labs printed out.

## 2018-09-08 NOTE — Telephone Encounter (Signed)
Patient called back to find out how to get a paper copy of his work note and his labs.  Patient is going to come tomorrow to pick up his papers and someone will run these out to him instead of him coming into the office.   Could you print these for [place] and place them up front??

## 2018-09-08 NOTE — Telephone Encounter (Signed)
Patient states he is still having sore throat, fever of 100-101-usually first thing in the morning and in the evenings it spikes, cold chills, bad cough. Patient does not mind going back to work but he was not suppose to go to work with having fever. Does patient need to come in for recheck? Patient states he does feel much better from how he was on June 30th when he had virtual visit.

## 2018-09-08 NOTE — Telephone Encounter (Signed)
Patient is requesting a call back to discuss returning to work. He is also wanting to know how he can get a copy of his test results. He was unable to get this off his my chart.    Best Phone- [phone]

## 2018-09-08 NOTE — Telephone Encounter (Signed)
Called to discuss symptoms.   Advised staying out of work until 24 hours w/o a fever. New note provided for work.   Discussed ER precautions and that SOB can be a late symptom, but overall improving.   Discussed that given negative result and persistent fever could consider urgent care to evaluate for other diagnosis but also that he could have been the 30% with false negative test result.   Pt had a lot of questions about accessing work note as well as test results. Has MyChart and tried to advise screen shot, but he felt he would need printed results.   Advised calling back to discuss with front desk or MA for how best to get paper copy of notes/lab results.   Lesleigh Noe

## 2018-09-15 ENCOUNTER — Telehealth: Payer: Self-pay | Admitting: Primary Care

## 2018-09-15 ENCOUNTER — Other Ambulatory Visit (INDEPENDENT_AMBULATORY_CARE_PROVIDER_SITE_OTHER): Payer: BC Managed Care – PPO

## 2018-09-15 DIAGNOSIS — R5383 Other fatigue: Secondary | ICD-10-CM | POA: Diagnosis not present

## 2018-09-15 DIAGNOSIS — W57XXXA Bitten or stung by nonvenomous insect and other nonvenomous arthropods, initial encounter: Secondary | ICD-10-CM

## 2018-09-15 NOTE — Telephone Encounter (Signed)
Best number  [phone]  Pt called stating he has been fever free  since sat 7/11.  Since his covid test came back neg.  He wanted to know if he could be tested for tick fever.  He stated he no energy and does no feel right.  He pulled 2 ticks off him last month   He needs a note to be cleared to go back to work 7/14

## 2018-09-15 NOTE — Telephone Encounter (Signed)
Pt put in following request via [facility]. Please advise:  Appointment Request From: [name]  With Provider: [name], NP [facility]  Preferred Date Range: Any date 09/15/2018 or later  Preferred Times: Any Time  Reason for visit: Request an Appointment  Comments: Is it possible that I have tick fever?

## 2018-09-15 NOTE — Telephone Encounter (Signed)
Spoken and notified patient of Tawni Millers comments. Patient has been fever free since Saturday. He pulled the tick from his back and one off his inner thigh.  Lab appt today 09/15/2018

## 2018-09-15 NOTE — Telephone Encounter (Signed)
Noted.  Please clarify that patient has been fever free without the use of medications like ibuprofen and Tylenol. If so then I will write a work note stating that he may return on the 14th.  Okay to schedule lab only appointment for tickborne illness. Which part of his body did he pull off the ticks? If his tick borne illness labs are negative then he needs to be seen in the office.

## 2018-09-15 NOTE — Telephone Encounter (Signed)
See phone note sent to [facility]. Is he wanting an appointment?

## 2018-09-16 NOTE — Telephone Encounter (Signed)
Pt called regarding note. He states he was unable to go back to work today since he did not have the note, so he is requesting it to be written for him to return tomorrow. He needs it printed so he can come by and pick it up.

## 2018-09-16 NOTE — Telephone Encounter (Signed)
Spoken to patient and inform him that letter is ready. Left in the front office for patient.

## 2018-09-16 NOTE — Telephone Encounter (Signed)
Noted. Work note printed and ready for pick up, placed in Chan's inbox.

## 2018-09-17 LAB — B. BURGDORFI ANTIBODIES BY WB

## 2018-09-17 LAB — ROCKY MTN SPOTTED FVR ABS PNL(IGG+IGM)
RMSF IgG: NOT DETECTED
RMSF IgM: NOT DETECTED

## 2018-10-27 ENCOUNTER — Encounter: Payer: Self-pay | Admitting: Primary Care

## 2018-10-27 ENCOUNTER — Other Ambulatory Visit: Payer: Self-pay

## 2018-10-27 ENCOUNTER — Ambulatory Visit (INDEPENDENT_AMBULATORY_CARE_PROVIDER_SITE_OTHER): Payer: BC Managed Care – PPO | Admitting: Primary Care

## 2018-10-27 DIAGNOSIS — R197 Diarrhea, unspecified: Secondary | ICD-10-CM

## 2018-10-27 NOTE — Assessment & Plan Note (Signed)
Acute diarrhea with vomiting lasting 48 hours total.  Appears well today, is eating and drinking without difficulty.  Suspect viral etiology that is unrelated to Covid-19. Agree that he is well to return to work this coming Wednesday as scheduled. Work note provided but also explained that he cannot go into work if his symptoms return and/or develops fevers/respiratory symptoms.

## 2018-10-27 NOTE — Progress Notes (Signed)
Subjective:    Patient ID: [name] [name], male    DOB: February 17, 1971, 48 y.o.   MRN: [id number]  HPI  Virtual Visit via Video Note  I connected with [name] on 10/27/18 at 10:20 AM EDT by a video enabled telemedicine application and verified that I am speaking with the correct person using two identifiers.  Location: Patient: Home Provider: Office   I discussed the limitations of evaluation and management by telemedicine and the availability of in person appointments. The patient expressed understanding and agreed to proceed.  History of Present Illness:  [name] is a 48 year old male with a history of hemorrhoids, hypertension, colonic polyps, anxiety disorder, diarrhea, bariatric surgery, tobacco abuse who presents today with a chief complaint of diarrhea.  His symptoms began three evenings ago with several episodes of vomiting. He then experienced diarrhea which was recurrent. His last episode of vomiting was Saturday evening, last episode of diarrhea was last night. Overall he's noticed significant improvement in symptoms, has been eating and drinking well. He did have some mid abdominal cramping that has improved.   He's taken Tylenol for a headache with improvement. He denies fevers, cough, chills, body aches. He had to call out of work on Saturday and Sunday, was told by his employer that he needed a doctors note in order to return to work. He is scheduled to return to work on August 26th this week.   Observations/Objective:  Alert and oriented. Appears well, not sickly. No distress. Speaking in complete sentences.   Assessment and Plan:  Acute diarrhea with vomiting lasting 48 hours total.  Appears well today, is eating and drinking without difficulty.  Suspect viral etiology that is unrelated to Covid-19. Agree that he is well to return to work this coming Wednesday as scheduled. Work note provided but also explained that he cannot go into work if his  symptoms return and/or develops fevers/respiratory symptoms.  Follow Up Instructions:  Continue to work on hydration as discussed.  Be sure to eat a healthy diet.  You may return to work as long as your symptoms have resolved and you are not running fevers.  It was a pleasure to see you today!    I discussed the assessment and treatment plan with the patient. The patient was provided an opportunity to ask questions and all were answered. The patient agreed with the plan and demonstrated an understanding of the instructions.   The patient was advised to call back or seek an in-person evaluation if the symptoms worsen or if the condition fails to improve as anticipated. [name], NP    Review of Systems  Constitutional: Negative for fever.  HENT: Negative for congestion.   Respiratory: Negative for cough and shortness of breath.   Gastrointestinal: Negative for abdominal pain, diarrhea and vomiting.  Musculoskeletal: Negative for myalgias.       Past Medical History:  Diagnosis Date  . Abdominal pain 07/31/2014  . Abnormal CT scan, small bowel 12/28/2015  . Acute bronchitis 04/09/2013  . Acute conjunctivitis of both eyes 09/04/2017  . Acute pharyngitis 04/13/2013  . ADHD (attention deficit hyperactivity disorder) 10/21/2006   Qualifier: Diagnosis of  By: Maxie Better FNP, Rosalita Levan   . AKI (acute kidney injury) ([place]) 06/26/2017  . Alternating constipation and diarrhea 10/12/2015  . ANGER 10/31/2006   Qualifier: Diagnosis of  By: Maxie Better FNP, Rosalita Levan   . ANXIETY 11/14/2009   Qualifier: Diagnosis of  By: Deborra Medina MD, Tanja Port    .  Asthma   . 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 Bronchitis   . Calculus of gallbladder with acute cholecystitis without obstruction 02/15/2015  . Chest pain 03/17/2018  . Chest wall pain 02/10/2015  . Cholelithiasis 02/13/2015  . Chronic bronchitis ([place])   .  Complication of anesthesia    usually wake up in the middle of procedure or surgery  . Conjunctivitis 10/18/2010  . Dehydration 06/26/2017  . DEPENDENT EDEMA 11/09/2008   Qualifier: Diagnosis of  By: Maxie Better FNP, Rosalita Levan   . Diarrhea 11/06/2016  . Enteritis 12/21/2015  . Erectile dysfunction 04/11/2017  . Gallstone 02/10/2015  . Gallstones   . GASTROENTERITIS WITHOUT DEHYDRATION 05/15/2010   Qualifier: Diagnosis of  By: Deborra Medina MD, Tanja Port    . GERD 10/21/2006   Qualifier: Diagnosis of  By: Maxie Better FNP, Rosalita Levan   . GERD (gastroesophageal reflux disease)   . Hepatic steatosis 05/31/2015  . History of colonic polyps 12/28/2015  . Hypertension   . Lumbar back sprain 01/28/2017  . Mole of skin 05/28/2012  . Morbid obesity ([place]) 07/30/2006   Qualifier: Diagnosis of  By: Maxie Better FNP, Rosalita Levan   . Nausea vomiting and diarrhea 07/31/2014  . Obesity   . OSA (obstructive sleep apnea) 07/30/2006   Qualifier: Diagnosis of  By: Maxie Better FNP, Rosalita Levan   . Partial small bowel obstruction ([place]) 12/14/2015  . Right knee pain 12/01/2012  . SBO (small bowel obstruction) ([place]) 06/26/2017  . Skin lesion 11/07/2014  . Skin tag 05/28/2012  . Sleep apnea    "suppose to wear mask but I don't" (05/31/2015)  . Small bowel obstruction ([place]) 05/31/2015  . Small bowel obstruction, partial ([place]) 12/13/2015  . Smoker 09/04/2017  . Viral gastroenteritis 03/23/2016     Social History   Socioeconomic History  . Marital status: Legally Separated    Spouse name: Not on file  . Number of children: 1  . Years of education: Not on file  . Highest education level: Not on file  Occupational History  . Occupation: Publishing rights manager: [employer]  Social Needs  . Financial resource strain: Not on file  . Food insecurity    Worry: Not on file    Inability: Not on file  . Transportation needs    Medical: Not on file    Non-medical: Not on file  Tobacco Use  . Smoking status: Former  Smoker    Packs/day: 1.00    Years: 20.00    Pack years: 20.00    Types: Cigarettes    Start date: 10/03/2016    Quit date: 10/03/2017    Years since quitting: 1.0  . Smokeless tobacco: Current User    Types: Snuff  Substance and Sexual Activity  . Alcohol use: Yes    Alcohol/week: 13.0 standard drinks    Types: 13 Shots of liquor per week    Comment: "a couple of shots a day"  . Drug use: No  . Sexual activity: Yes  Lifestyle  . Physical activity    Days per week: Not on file    Minutes per session: Not on file  . Stress: Not on file  Relationships  . Social Herbalist on phone: Not on file    Gets together: Not on file    Attends religious service: Not on file    Active member of club or organization: Not on file  Attends meetings of clubs or organizations: Not on file    Relationship status: Not on file  . Intimate partner violence    Fear of current or ex partner: Not on file    Emotionally abused: Not on file    Physically abused: Not on file    Forced sexual activity: Not on file  Other Topics Concern  . Not on file  Social History Narrative  . Not on file    Past Surgical History:  Procedure Laterality Date  . CHOLECYSTECTOMY N/A 02/14/2015   Procedure: LAPAROSCOPIC CHOLECYSTECTOMY WITH INTRAOPERATIVE CHOLANGIOGRAM;  Surgeon: Coralie Keens, MD;  Location: [facility];  Service: General;  Laterality: N/A;  . COLONOSCOPY  10/16/2005   Polyps (tubular adenoma 2007), external hemorrhoids  . COLONOSCOPY WITH PROPOFOL N/A 01/05/2016   Procedure: COLONOSCOPY WITH PROPOFOL;  Surgeon: Milus Banister, MD;  Location: [facility];  Service: Endoscopy;  Laterality: N/A;  . FINGER SURGERY Left    reconstructive - 2 surgeries on hand  . LAPAROSCOPIC GASTRIC SLEEVE RESECTION  10/2010    at [facility] History  Problem Relation Age of Onset  . Diabetes Mellitus II Mother   . Breast cancer Mother   . Ovarian cancer Mother   . Diabetes Mellitus II Father   .  CAD Father   . Stroke Father   . Cancer Father        ?  . Clotting disorder Father        blood clots in brain with surgery  . Cirrhosis Maternal Grandmother   . Breast cancer Maternal Grandmother     Allergies  Allergen Reactions  . Bee Venom Anaphylaxis  . Codeine Anaphylaxis    Tolerates dilaudid  . Hornet Venom Anaphylaxis  . Penicillins Hives and Other (See Comments)    Has patient had a PCN reaction causing immediate rash, facial/tongue/throat swelling, SOB or lightheadedness with hypotensionYES Has patient had a PCN reaction causing severe rash involving mucus membranes or skin necrosis: NO Has patient had a PCN reaction that required hospitalization NO Has patient had a PCN reaction occurring within the last 10 years: NO If all of the above answers are "NO", then may proceed with Cephalosporin use.    Current Outpatient Medications on File Prior to Visit  Medication Sig Dispense Refill  . albuterol (ACCUNEB) 0.63 MG/3ML nebulizer solution USE 1 VIAL IN NEBULIZER EVERY 6 HOURS AS NEEDED FOR WHEEZING OR  SHORTNESS  OF  BREATH 90 mL 0  . albuterol (PROAIR HFA) 108 (90 Base) MCG/ACT inhaler Inhale 2 puffs into the lungs every 6 (six) hours as needed for wheezing or shortness of breath. 1 Inhaler 3  . amLODipine (NORVASC) 10 MG tablet Take 1 tablet by mouth once daily 90 tablet 2  . cetirizine (ZYRTEC) 10 MG tablet Take 10 mg by mouth daily.    Marland Kitchen lisinopril (PRINIVIL,ZESTRIL) 10 MG tablet Take 1 tablet (10 mg total) by mouth daily. For blood pressure. 90 tablet 3  . omeprazole (PRILOSEC) 20 MG capsule Take 1 capsule by mouth once daily 90 capsule 0  . sildenafil (REVATIO) 20 MG tablet Take 2-5 tablets by mouth 30 minutes prior to intercourse. 50 tablet 0   Current Facility-Administered Medications on File Prior to Visit  Medication Dose Route Frequency Provider Last Rate Last Dose  . technetium tetrofosmin (TC-MYOVIEW) injection A999333 millicurie  A999333 millicurie Intravenous  Once PRN Nahser, Wonda Cheng, MD        Wt (!) 411 lb (186.4  kg)   BMI 52.77 kg/m    Objective:   Physical Exam  Constitutional: He is oriented to person, place, and time. He appears well-nourished. He does not have a sickly appearance. He does not appear ill.  Respiratory: Effort normal.  Neurological: He is alert and oriented to person, place, and time.  Psychiatric: He has a normal mood and affect.           Assessment & Plan:

## 2018-10-27 NOTE — Patient Instructions (Signed)
Continue to work on hydration as discussed.  Be sure to eat a healthy diet.  You may return to work as long as your symptoms have resolved and you are not running fevers.  It was a pleasure to see you today!

## 2018-10-28 ENCOUNTER — Encounter: Payer: Self-pay | Admitting: Primary Care

## 2018-11-03 ENCOUNTER — Encounter: Payer: Self-pay | Admitting: Internal Medicine

## 2018-11-03 ENCOUNTER — Ambulatory Visit (INDEPENDENT_AMBULATORY_CARE_PROVIDER_SITE_OTHER): Payer: BC Managed Care – PPO | Admitting: Internal Medicine

## 2018-11-03 DIAGNOSIS — M5441 Lumbago with sciatica, right side: Secondary | ICD-10-CM | POA: Diagnosis not present

## 2018-11-03 DIAGNOSIS — K591 Functional diarrhea: Secondary | ICD-10-CM | POA: Diagnosis not present

## 2018-11-03 MED ORDER — CYCLOBENZAPRINE HCL 10 MG PO TABS: 10.0000 mg | ORAL_TABLET | Freq: Three times a day (TID) | ORAL | 0 refills | Status: DC | PRN

## 2018-11-03 MED ORDER — PREDNISONE 10 MG PO TABS: ORAL_TABLET | ORAL | 0 refills | Status: DC

## 2018-11-03 NOTE — Progress Notes (Signed)
Virtual Visit via Video Note  I connected with [name] [name] on 11/03/18 at 10:15 AM EDT by a video enabled telemedicine application and verified that I am speaking with the correct person using two identifiers.  Location: Patient: Home Provider: Office   I discussed the limitations of evaluation and management by telemedicine and the availability of in person appointments. The patient expressed understanding and agreed to proceed.  History of Present Illness:  Pt reports diarrhea. This started 10 days ago. He reports the diarrhea is loose and water, he denies blood in his stool. He reports associated abdominal cramping and vomiting 1 week ago but this has since resolved.  He was seen for the same 8/24 by his PCP. His symptoms had improved so he was advised to stay well hydrated and monitor symptoms at that time. He denies recent changes in diet or medications. He had a colonoscopy in 2017. He has not taken anything OTC for this.  He also c/o back pain s/p fall. This occurred last Tuesday night. He was walking up stairs with groceries when he fell up the steps. He describes low back pain that he describes as achy and sharp. The pain radiates into his right leg. He denies numbness, tingling or weakness. He denies issues with bowel or bladder incontinence.  He has taken Tylenol and Hydrocodone with minimal relief.   Past Medical History:  Diagnosis Date  . Abdominal pain 07/31/2014  . Abnormal CT scan, small bowel 12/28/2015  . Acute bronchitis 04/09/2013  . Acute conjunctivitis of both eyes 09/04/2017  . Acute pharyngitis 04/13/2013  . ADHD (attention deficit hyperactivity disorder) 10/21/2006   Qualifier: Diagnosis of  By: Maxie Better FNP, Rosalita Levan   . AKI (acute kidney injury) ([place]) 06/26/2017  . Alternating constipation and diarrhea 10/12/2015  . ANGER 10/31/2006   Qualifier: Diagnosis of  By: Maxie Better FNP, Rosalita Levan   . ANXIETY 11/14/2009   Qualifier: Diagnosis of  By: Deborra Medina MD,  Tanja Port    . Asthma   . 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 Bronchitis   . Calculus of gallbladder with acute cholecystitis without obstruction 02/15/2015  . Chest pain 03/17/2018  . Chest wall pain 02/10/2015  . Cholelithiasis 02/13/2015  . Chronic bronchitis ([place])   . Complication of anesthesia    usually wake up in the middle of procedure or surgery  . Conjunctivitis 10/18/2010  . Dehydration 06/26/2017  . DEPENDENT EDEMA 11/09/2008   Qualifier: Diagnosis of  By: Maxie Better FNP, Rosalita Levan   . Diarrhea 11/06/2016  . Enteritis 12/21/2015  . Erectile dysfunction 04/11/2017  . Gallstone 02/10/2015  . Gallstones   . GASTROENTERITIS WITHOUT DEHYDRATION 05/15/2010   Qualifier: Diagnosis of  By: Deborra Medina MD, Tanja Port    . GERD 10/21/2006   Qualifier: Diagnosis of  By: Maxie Better FNP, Rosalita Levan   . GERD (gastroesophageal reflux disease)   . Hepatic steatosis 05/31/2015  . History of colonic polyps 12/28/2015  . Hypertension   . Lumbar back sprain 01/28/2017  . Mole of skin 05/28/2012  . Morbid obesity ([place]) 07/30/2006   Qualifier: Diagnosis of  By: Maxie Better FNP, Rosalita Levan   . Nausea vomiting and diarrhea 07/31/2014  . Obesity   . OSA (obstructive sleep apnea) 07/30/2006   Qualifier: Diagnosis of  By: Maxie Better FNP, Rosalita Levan   . Partial small bowel obstruction ([place]) 12/14/2015  . Right knee pain 12/01/2012  . SBO (small bowel obstruction) (  [place]) 06/26/2017  . Skin lesion 11/07/2014  . Skin tag 05/28/2012  . Sleep apnea    "suppose to wear mask but I don't" (05/31/2015)  . Small bowel obstruction ([place]) 05/31/2015  . Small bowel obstruction, partial ([place]) 12/13/2015  . Smoker 09/04/2017  . Viral gastroenteritis 03/23/2016    Current Outpatient Medications  Medication Sig Dispense Refill  . albuterol (ACCUNEB) 0.63 MG/3ML nebulizer solution USE 1 VIAL IN NEBULIZER EVERY 6 HOURS AS NEEDED FOR WHEEZING OR  SHORTNESS  OF   BREATH 90 mL 0  . albuterol (PROAIR HFA) 108 (90 Base) MCG/ACT inhaler Inhale 2 puffs into the lungs every 6 (six) hours as needed for wheezing or shortness of breath. 1 Inhaler 3  . amLODipine (NORVASC) 10 MG tablet Take 1 tablet by mouth once daily 90 tablet 2  . cetirizine (ZYRTEC) 10 MG tablet Take 10 mg by mouth daily.    Marland Kitchen lisinopril (PRINIVIL,ZESTRIL) 10 MG tablet Take 1 tablet (10 mg total) by mouth daily. For blood pressure. 90 tablet 3  . omeprazole (PRILOSEC) 20 MG capsule Take 1 capsule by mouth once daily 90 capsule 0  . sildenafil (REVATIO) 20 MG tablet Take 2-5 tablets by mouth 30 minutes prior to intercourse. 50 tablet 0   No current facility-administered medications for this visit.    Facility-Administered Medications Ordered in Other Visits  Medication Dose Route Frequency Provider Last Rate Last Dose  . technetium tetrofosmin (TC-MYOVIEW) injection A999333 millicurie  A999333 millicurie Intravenous Once PRN Nahser, Wonda Cheng, MD        Allergies  Allergen Reactions  . Bee Venom Anaphylaxis  . Codeine Anaphylaxis    Tolerates dilaudid  . Hornet Venom Anaphylaxis  . Penicillins Hives and Other (See Comments)    Has patient had a PCN reaction causing immediate rash, facial/tongue/throat swelling, SOB or lightheadedness with hypotensionYES Has patient had a PCN reaction causing severe rash involving mucus membranes or skin necrosis: NO Has patient had a PCN reaction that required hospitalization NO Has patient had a PCN reaction occurring within the last 10 years: NO If all of the above answers are "NO", then may proceed with Cephalosporin use.    Family History  Problem Relation Age of Onset  . Diabetes Mellitus II Mother   . Breast cancer Mother   . Ovarian cancer Mother   . Diabetes Mellitus II Father   . CAD Father   . Stroke Father   . Cancer Father        ?  . Clotting disorder Father        blood clots in brain with surgery  . Cirrhosis Maternal Grandmother    . Breast cancer Maternal Grandmother     Social History   Socioeconomic History  . Marital status: Legally Separated    Spouse name: Not on file  . Number of children: 1  . Years of education: Not on file  . Highest education level: Not on file  Occupational History  . Occupation: Publishing rights manager: [employer]  Social Needs  . Financial resource strain: Not on file  . Food insecurity    Worry: Not on file    Inability: Not on file  . Transportation needs    Medical: Not on file    Non-medical: Not on file  Tobacco Use  . Smoking status: Former Smoker    Packs/day: 1.00    Years: 20.00    Pack years: 20.00    Types: Cigarettes  Start date: 10/03/2016    Quit date: 10/03/2017    Years since quitting: 1.0  . Smokeless tobacco: Current User    Types: Snuff  Substance and Sexual Activity  . Alcohol use: Yes    Alcohol/week: 13.0 standard drinks    Types: 13 Shots of liquor per week    Comment: "a couple of shots a day"  . Drug use: No  . Sexual activity: Yes  Lifestyle  . Physical activity    Days per week: Not on file    Minutes per session: Not on file  . Stress: Not on file  Relationships  . Social Herbalist on phone: Not on file    Gets together: Not on file    Attends religious service: Not on file    Active member of club or organization: Not on file    Attends meetings of clubs or organizations: Not on file    Relationship status: Not on file  . Intimate partner violence    Fear of current or ex partner: Not on file    Emotionally abused: Not on file    Physically abused: Not on file    Forced sexual activity: Not on file  Other Topics Concern  . Not on file  Social History Narrative  . Not on file     Constitutional: Denies fever, malaise, fatigue, headache or abrupt weight changes.  Respiratory: Denies difficulty breathing, shortness of breath, cough or sputum production.   Cardiovascular: Denies chest pain, chest  tightness, palpitations or swelling in the hands or feet.  Gastrointestinal: Pt reports diarrhea. Denies abdominal pain, bloating, constipation, or blood in the stool.  Musculoskeletal: Pt reports back pain. Denies decrease in range of motion, difficulty with gait,  or joint swelling.  Skin: Denies redness, rashes, lesions or ulcercations.  Neurological: Denies numbness, tingling, weakness or problems with balance and coordination.   No other specific complaints in a complete review of systems (except as listed in HPI above).  Observations/Objective:  Wt Readings from Last 3 Encounters:  10/27/18 (!) 411 lb (186.4 kg)  07/30/18 (!) 411 lb (186.4 kg)  07/07/18 (!) 408 lb 12 oz (185.4 kg)    General: Appears his stated age, obese in NAD. Skin: Warm, dry and intact. No bruising or abrasion noted. Pulmonary/Chest: Normal effort. No respiratory distress.   Abdomen: No obvious distention or masses noted.  Musculoskeletal: Pain with flexion, extension and rotation of the spine. Gait not visualized. Neurological: Alert and oriented. Coordination grossly normal.   BMET    Component Value Date/Time   NA 134 (L) 04/03/2018 1149   K 4.2 04/03/2018 1149   CL 100 04/03/2018 1149   CO2 28 04/03/2018 1149   GLUCOSE 99 04/03/2018 1149   GLUCOSE 83 03/18/2006 0953   BUN 13 04/03/2018 1149   CREATININE 0.94 04/03/2018 1149   CALCIUM 9.3 04/03/2018 1149   GFRNONAA >60 03/13/2018 0828   GFRAA >60 03/13/2018 0828    Lipid Panel     Component Value Date/Time   CHOL 153 04/03/2018 1149   TRIG 104.0 04/03/2018 1149   TRIG 87 03/18/2006 0953   HDL 51.00 04/03/2018 1149   CHOLHDL 3 04/03/2018 1149   VLDL 20.8 04/03/2018 1149   LDLCALC 81 04/03/2018 1149    CBC    Component Value Date/Time   WBC 5.2 03/13/2018 0828   RBC 5.25 03/13/2018 0828   HGB 16.0 03/13/2018 0828   HCT 46.5 03/13/2018 0828   PLT 213  03/13/2018 0828   MCV 88.6 03/13/2018 0828   MCH 30.5 03/13/2018 0828   MCHC  34.4 03/13/2018 0828   RDW 12.2 03/13/2018 0828   LYMPHSABS 1.7 08/20/2017 1623   MONOABS 0.6 08/20/2017 1623   EOSABS 0.1 08/20/2017 1623   BASOSABS 0.1 08/20/2017 1623    Hgb A1C Lab Results  Component Value Date   HGBA1C 5.1 04/03/2018       Assessment and Plan:  Diarrhea:  He is not interested in stool culture, C Diff He would like to keep a food diary and see if certain foods are triggering this Advised him to take Imodium OTC as needed  Acute Bilateral Low Back Pain with Right Sided Sciatica:  RX for Pred Taper x 6 days RX for Flexeril 10 mg TID prn- sedation caution given He is requesting RX for Norco- I declined at this time Encouraged heat, massage and stretching Work note provided  Return precautions discussed   Follow Up Instructions:    I discussed the assessment and treatment plan with the patient. The patient was provided an opportunity to ask questions and all were answered. The patient agreed with the plan and demonstrated an understanding of the instructions.   The patient was advised to call back or seek an in-person evaluation if the symptoms worsen or if the condition fails to improve as anticipated.     Webb Silversmith, NP

## 2018-11-03 NOTE — Patient Instructions (Signed)

## 2018-11-06 NOTE — Telephone Encounter (Signed)
Best number [phone]  Pt called needing a work note from 8/26 to 8/30.  He stated he could not work he still had diarrhea  Pt is aware [name] is out of office till 9/14  Pt going back to work 9/4 and needs note  Please advise   PLEASE CALL PT

## 2018-11-06 NOTE — Telephone Encounter (Signed)
I have called patient. Inform patient that we are unable to write a work note for 8/26 to 8/30. [name] is out of the office and I will need her permission to be able to write a work for those dates.  Patient verbalized understanding. He will contact when [name] returns regarding this. He will let his job know as well.

## 2018-11-19 ENCOUNTER — Other Ambulatory Visit: Payer: Self-pay | Admitting: Primary Care

## 2018-11-19 DIAGNOSIS — K219 Gastro-esophageal reflux disease without esophagitis: Secondary | ICD-10-CM

## 2019-02-03 ENCOUNTER — Encounter: Payer: Self-pay | Admitting: Family Medicine

## 2019-02-03 ENCOUNTER — Ambulatory Visit: Payer: BC Managed Care – PPO | Admitting: Family Medicine

## 2019-02-03 ENCOUNTER — Telehealth: Payer: Self-pay

## 2019-02-03 ENCOUNTER — Other Ambulatory Visit: Payer: Self-pay

## 2019-02-03 DIAGNOSIS — M25569 Pain in unspecified knee: Secondary | ICD-10-CM

## 2019-02-03 MED ORDER — CYCLOBENZAPRINE HCL 10 MG PO TABS: 5.0000 mg | ORAL_TABLET | Freq: Every evening | ORAL | 0 refills | Status: DC | PRN

## 2019-02-03 NOTE — Telephone Encounter (Signed)
[facility] Night - Client Nonclinical Telephone Record AccessNurse Client [facility] Night - Client Client Site [place] Physician Alma Friendly - NP Contact Type Call Who Is Calling Patient / Member / Family / Caregiver Caller Name Mickie Felger Caller Phone Number [phone] Patient Name [name] [name] Patient DOB [birth year] Call Type Message Only Information Provided Reason for Call Request to Schedule Office Appointment Initial Comment Caller states he is needing to schedule an appt for today if possible, and asks for a call back. Refused triage. Wreck: 1.5 weeks ago, leg hurting badly. Additional Comment Provided information for a call back from the office. Disp. Time Disposition Final User 02/03/2019 7:31:50 AM General Information Provided Yes Rosana Fret Call Closed By: Rosana Fret Transaction Date/Time: 02/03/2019 7:29:54 AM (ET)

## 2019-02-03 NOTE — Telephone Encounter (Signed)
Patient already scheduled appointment with Dr.Duncan today at 12:30.
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
## 2019-02-03 NOTE — Patient Instructions (Addendum)
Ice your knee and use a knee sleeve.   Use flexeril at night if needed.  Take care.  Glad to see you.  Update [place] as needed.
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
## 2019-02-11 ENCOUNTER — Ambulatory Visit (INDEPENDENT_AMBULATORY_CARE_PROVIDER_SITE_OTHER)
Admission: RE | Admit: 2019-02-11 | Discharge: 2019-02-11 | Disposition: A | Discharge status: Home / Self Care | Payer: BC Managed Care – PPO | Source: Ambulatory Visit | Attending: Family Medicine | Admitting: Family Medicine

## 2019-02-11 ENCOUNTER — Ambulatory Visit: Payer: BC Managed Care – PPO | Admitting: Family Medicine

## 2019-02-11 ENCOUNTER — Other Ambulatory Visit: Payer: Self-pay

## 2019-02-11 ENCOUNTER — Encounter: Payer: Self-pay | Admitting: Family Medicine

## 2019-02-11 VITALS — BP 144/100 | HR 91 | Temp 97.2°F | Ht 74.0 in | Wt 399.5 lb

## 2019-02-11 DIAGNOSIS — M25552 Pain in left hip: Secondary | ICD-10-CM

## 2019-02-11 DIAGNOSIS — S83105A Unspecified dislocation of left knee, initial encounter: Secondary | ICD-10-CM

## 2019-02-11 DIAGNOSIS — M5416 Radiculopathy, lumbar region: Secondary | ICD-10-CM | POA: Diagnosis not present

## 2019-02-11 MED ORDER — PREDNISONE 20 MG PO TABS: ORAL_TABLET | ORAL | 0 refills | Status: DC
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
## 2019-02-19 ENCOUNTER — Telehealth: Payer: Self-pay | Admitting: Primary Care

## 2019-02-19 NOTE — Telephone Encounter (Signed)
Pt called schedule follow up appointment 12/23.  He wanted to let you know he has not went back to work.  He needs a work note to returned to work on 02/23/2019  Please advise when ready for pick up and please put note on mychart for pt

## 2019-02-19 NOTE — Telephone Encounter (Signed)
Yes  02/23/2019 RTW is ok

## 2019-02-19 NOTE — Telephone Encounter (Signed)
Okay to extend work note.

## 2019-02-19 NOTE — Telephone Encounter (Signed)
Letter written as instructed by Dr. Lorelei Pont.  [place] notified work note was sent to his [place].

## 2019-02-20 ENCOUNTER — Telehealth: Payer: Self-pay | Admitting: Primary Care

## 2019-02-20 NOTE — Telephone Encounter (Signed)
Pt dropped off forms for work that needs to be filled out prior to him going back to work.  Forms state that he is capable of preforming his job duties.   Pt is aware dr copland will be out of office till Monday 12/21  In dr copland in box for review and signature

## 2019-02-23 NOTE — Telephone Encounter (Signed)
Patient called back in regards to paperwork he needed filled out by Dr Lorelei Pont. He stated he is really needing this done as soon as possible for his work

## 2019-02-23 NOTE — Telephone Encounter (Addendum)
Please get me the forms and I'll work on them.  Thanks.

## 2019-02-23 NOTE — Telephone Encounter (Signed)
Forms placed in Dr. Josefine Class in box in his office.

## 2019-02-23 NOTE — Telephone Encounter (Signed)
I spoke to the patient, and he is doing fine now and should be cleared for work.   I am at home with a COVID-19 quarantine, so I can't do his paperwork.  It should be cut and dry and basically completed from [place].  Since Brigitte Pulse saw him initially, I hope that he would not mind doing this for him.  I won't be able to possibly be in the office until Christmas day - obviously not helpful.

## 2019-02-24 NOTE — Telephone Encounter (Signed)
Form done. Thanks. 

## 2019-02-24 NOTE — Telephone Encounter (Signed)
Forms faxed and placed on Robin's desk.

## 2019-02-25 ENCOUNTER — Ambulatory Visit: Payer: BC Managed Care – PPO | Admitting: Family Medicine

## 2019-02-25 NOTE — Telephone Encounter (Signed)
Patient returned the call  Advised of message below

## 2019-02-25 NOTE — Telephone Encounter (Signed)
Left message asking pt to call office Please let pt know paperwork was faxed 12/22 and there is a copy here for him  Copy for pt Copy for scan

## 2019-03-02 ENCOUNTER — Ambulatory Visit (INDEPENDENT_AMBULATORY_CARE_PROVIDER_SITE_OTHER): Payer: BC Managed Care – PPO | Admitting: Family Medicine

## 2019-03-02 ENCOUNTER — Encounter: Payer: Self-pay | Admitting: Family Medicine

## 2019-03-02 DIAGNOSIS — J988 Other specified respiratory disorders: Secondary | ICD-10-CM | POA: Diagnosis not present

## 2019-03-02 MED ORDER — ALBUTEROL SULFATE HFA 108 (90 BASE) MCG/ACT IN AERS: 2.0000 | INHALATION_SPRAY | Freq: Four times a day (QID) | RESPIRATORY_TRACT | 3 refills | Status: DC | PRN
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
## 2019-03-02 NOTE — Assessment & Plan Note (Addendum)
Symptoms concerning for viral respiratory infection. Covid test pending. I have recommended he stay home from work at least until covid test returns, then will determine RTW date. I have asked him to notify us when his covid test returns ([facility]).  Supportive care reviewed. Red flags to seek further care reviewed.  He agrees with plan.

## 2019-03-05 ENCOUNTER — Encounter: Payer: Self-pay | Admitting: Family Medicine

## 2019-03-05 NOTE — Telephone Encounter (Signed)
Patient stated that his working is requesting a note.  In order for him to return to work on Monday the notes has to have a specific date when he can return.patient wanted to set up a virtual visit with you or kate but there was no appointments open today due to office closing early .   Patient is wanting to return to work on Monday and would like this note done today

## 2019-03-27 ENCOUNTER — Encounter: Payer: Self-pay | Admitting: Family Medicine

## 2019-03-27 ENCOUNTER — Ambulatory Visit (INDEPENDENT_AMBULATORY_CARE_PROVIDER_SITE_OTHER): Payer: BC Managed Care – PPO | Admitting: Family Medicine

## 2019-03-27 VITALS — Ht 74.0 in

## 2019-03-27 DIAGNOSIS — U071 COVID-19: Secondary | ICD-10-CM | POA: Diagnosis not present

## 2019-03-27 DIAGNOSIS — J441 Chronic obstructive pulmonary disease with (acute) exacerbation: Secondary | ICD-10-CM

## 2019-03-27 HISTORY — DX: COVID-19: U07.1

## 2019-03-27 MED ORDER — PREDNISONE 10 MG PO TABS: ORAL_TABLET | ORAL | 0 refills | Status: DC

## 2019-03-27 MED ORDER — AZITHROMYCIN 250 MG PO TABS: ORAL_TABLET | ORAL | 0 refills | Status: DC

## 2019-03-27 NOTE — Progress Notes (Signed)
VIRTUAL VISIT Due to national recommendations of social distancing due to [place] 19, a virtual visit is felt to be most appropriate for this patient at this time.   I connected with the patient on 03/27/19 at 11:00 AM EST by virtual telehealth platform and verified that I am speaking with the correct person using two identifiers.   I discussed the limitations, risks, security and privacy concerns of performing an evaluation and management service by  virtual telehealth platform and the availability of in person appointments. I also discussed with the patient that there may be a patient responsible charge related to this service. The patient expressed understanding and agreed to proceed.  Patient location: Home Provider Location: [facility] Participants: Eliezer Lofts and [name]   Chief Complaint  Patient presents with  . Nasal Congestion    Tested Positive for Covid 03/16/2019   . Cough  . Headache    History of Present Illness:  49 year old male pt of [name] with history of COPD, HTN, morbid obesity, current smoking history and OSA presents following positive COVID19 test.   He reports symptoms started at the end of Dec.. tested negative on dec26 He started feeling better, symptoms resolved.  Tested again on 11/4.. negative.   Tested on 1/11.. removed from work. He has recurrence of cough..starting 1/13. 1/14.. started with body ache fatigue,  Strange taste in mouth, sneezing, mild SOB. Using albuterol as needed.  He was feeling better until today. Subjective fever, no chills. Mild SOB, wheeze.  COVID 19 screen No recent travel or known exposure to COVID19 The patient denies respiratory symptoms of COVID 19 at this time.  The importance of social distancing was discussed today.   Review of Systems  Constitutional: Positive for fever and malaise/fatigue.  HENT: Positive for congestion.   Eyes: Positive for redness.  Respiratory: Positive for cough,  shortness of breath and wheezing.   Cardiovascular: Negative for chest pain and leg swelling.  Gastrointestinal: Negative for abdominal pain, diarrhea, nausea and vomiting.  Musculoskeletal: Positive for myalgias.  Neurological: Positive for headaches. Negative for dizziness and weakness.      Past Medical History:  Diagnosis Date  . Abdominal pain 07/31/2014  . Abnormal CT scan, small bowel 12/28/2015  . Acute bronchitis 04/09/2013  . Acute conjunctivitis of both eyes 09/04/2017  . Acute pharyngitis 04/13/2013  . ADHD (attention deficit hyperactivity disorder) 10/21/2006   Qualifier: Diagnosis of  By: Maxie Better FNP, Rosalita Levan   . AKI (acute kidney injury) ([place]) 06/26/2017  . Alternating constipation and diarrhea 10/12/2015  . ANGER 10/31/2006   Qualifier: Diagnosis of  By: Maxie Better FNP, Rosalita Levan   . ANXIETY 11/14/2009   Qualifier: Diagnosis of  By: Deborra Medina MD, Tanja Port    . Asthma   . 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 Bronchitis   . Calculus of gallbladder with acute cholecystitis without obstruction 02/15/2015  . Chest pain 03/17/2018  . Chest wall pain 02/10/2015  . Cholelithiasis 02/13/2015  . Chronic bronchitis ([place])   . Complication of anesthesia    usually wake up in the middle of procedure or surgery  . Conjunctivitis 10/18/2010  . Dehydration 06/26/2017  . DEPENDENT EDEMA 11/09/2008   Qualifier: Diagnosis of  By: Maxie Better FNP, Rosalita Levan   . Diarrhea 11/06/2016  . Enteritis 12/21/2015  . Erectile dysfunction 04/11/2017  . Gallstone 02/10/2015  . Gallstones   . GASTROENTERITIS WITHOUT  DEHYDRATION 05/15/2010   Qualifier: Diagnosis of  By: Deborra Medina MD, Tanja Port    . GERD 10/21/2006   Qualifier: Diagnosis of  By: Maxie Better FNP, Rosalita Levan   . GERD (gastroesophageal reflux disease)   . Hepatic steatosis 05/31/2015  . History of colonic polyps 12/28/2015  . Hypertension   . Lumbar back sprain 01/28/2017  .  Mole of skin 05/28/2012  . Morbid obesity ([place]) 07/30/2006   Qualifier: Diagnosis of  By: Maxie Better FNP, Rosalita Levan   . Nausea vomiting and diarrhea 07/31/2014  . Obesity   . OSA (obstructive sleep apnea) 07/30/2006   Qualifier: Diagnosis of  By: Maxie Better FNP, Rosalita Levan   . Partial small bowel obstruction ([place]) 12/14/2015  . Right knee pain 12/01/2012  . SBO (small bowel obstruction) ([place]) 06/26/2017  . Skin lesion 11/07/2014  . Skin tag 05/28/2012  . Sleep apnea    "suppose to wear mask but I don't" (05/31/2015)  . Small bowel obstruction ([place]) 05/31/2015  . Small bowel obstruction, partial ([place]) 12/13/2015  . Smoker 09/04/2017  . Viral gastroenteritis 03/23/2016    reports that he quit smoking about 17 months ago. His smoking use included cigarettes. He started smoking about 2 years ago. He has a 20.00 pack-year smoking history. His smokeless tobacco use includes snuff. He reports current alcohol use of about 13.0 standard drinks of alcohol per week. He reports that he does not use drugs.   Current Outpatient Medications:  .  albuterol (ACCUNEB) 0.63 MG/3ML nebulizer solution, USE 1 VIAL IN NEBULIZER EVERY 6 HOURS AS NEEDED FOR WHEEZING OR  SHORTNESS  OF  BREATH, Disp: 90 mL, Rfl: 0 .  albuterol (PROAIR HFA) 108 (90 Base) MCG/ACT inhaler, Inhale 2 puffs into the lungs every 6 (six) hours as needed for wheezing or shortness of breath., Disp: 18 g, Rfl: 3 .  amLODipine (NORVASC) 10 MG tablet, Take 1 tablet by mouth once daily, Disp: 90 tablet, Rfl: 2 .  cetirizine (ZYRTEC) 10 MG tablet, Take 10 mg by mouth daily., Disp: , Rfl:  .  cyclobenzaprine (FLEXERIL) 10 MG tablet, Take 0.5 tablets (5 mg total) by mouth at bedtime as needed for muscle spasms (sedation caution)., Disp: 20 tablet, Rfl: 0 .  lisinopril (PRINIVIL,ZESTRIL) 10 MG tablet, Take 1 tablet (10 mg total) by mouth daily. For blood pressure., Disp: 90 tablet, Rfl: 3 .  omeprazole (PRILOSEC) 20 MG capsule, Take 1 capsule by mouth once  daily, Disp: 90 capsule, Rfl: 1 .  sildenafil (REVATIO) 20 MG tablet, Take 2-5 tablets by mouth 30 minutes prior to intercourse., Disp: 50 tablet, Rfl: 0 No current facility-administered medications for this visit.  Facility-Administered Medications Ordered in Other Visits:  .  technetium tetrofosmin (TC-MYOVIEW) injection A999333 millicurie, A999333 millicurie, Intravenous, Once PRN, Nahser, Wonda Cheng, MD   Observations/Objective: Height 6\' 2"  (1.88 m).  Physical Exam  Physical Exam Constitutional:      General: The patient is not in acute distress.Speaking in complete sentences Pulmonary:     Effort: Pulmonary effort is normal. No respiratory distress.  Neurological:     Mental Status: The patient is alert and oriented to person, place, and time.  Psychiatric:        Mood and Affect: Mood normal.        Behavior: Behavior normal.   Assessment and Plan COPD with acute exacerbation ([place]) Ginve worsening late in COVID illness.. concern for bacterial superinfection and COPD exac.   Treat with Z-pack and prednisone... but if not improving in  48 hours.. have low threshold for in person exam and possible CXR.    No red flags/need for ER visit or in-person exam at respiratory clinic at this time..     Can monitor Oxygen saturation at home with home monitor if able to obtain.  Go to ER if O2 sat < 90% on room air.    COVID-19 virus infection Symptoms were resolving now new illness.   pt out of COVID quarantine but given new illness.. remain out of work until improving.. work note provided until 03/31/2019 Reviewed home care and provided information through [facility].  Provided info about prevention of spread of COVID 19.       I discussed the assessment and treatment plan with the patient. The patient was provided an opportunity to ask questions and all were answered. The patient agreed with the plan and demonstrated an understanding of the instructions.   The patient was advised to  call back or seek an in-person evaluation if the symptoms worsen or if the condition fails to improve as anticipated.     Eliezer Lofts, MD

## 2019-03-27 NOTE — Progress Notes (Signed)
Work note sent to patient as instructed by Dr. Diona Browner.

## 2019-03-27 NOTE — Assessment & Plan Note (Signed)
Symptoms were resolving now new illness.   pt out of COVID quarantine but given new illness.. remain out of work until improving.. work note provided until 03/31/2019 Reviewed home care and provided information through [place].  Provided info about prevention of spread of COVID 19.

## 2019-03-27 NOTE — Assessment & Plan Note (Signed)
Ginve worsening late in COVID illness.. concern for bacterial superinfection and COPD exac.   Treat with Z-pack and prednisone... but if not improving in 48 hours.. have low threshold for in person exam and possible CXR.    No red flags/need for ER visit or in-person exam at respiratory clinic at this time..     Can monitor Oxygen saturation at home with home monitor if able to obtain.  Go to ER if O2 sat < 90% on room air.

## 2019-05-25 ENCOUNTER — Other Ambulatory Visit: Payer: Self-pay | Admitting: Primary Care

## 2019-05-25 DIAGNOSIS — I10 Essential (primary) hypertension: Secondary | ICD-10-CM

## 2019-06-01 NOTE — Telephone Encounter (Signed)
Pt had OV appt on 02/03/19.

## 2019-06-25 ENCOUNTER — Other Ambulatory Visit: Payer: Self-pay

## 2019-06-25 ENCOUNTER — Encounter: Payer: Self-pay | Admitting: Family Medicine

## 2019-06-25 ENCOUNTER — Telehealth (INDEPENDENT_AMBULATORY_CARE_PROVIDER_SITE_OTHER): Payer: BC Managed Care – PPO | Admitting: Family Medicine

## 2019-06-25 VITALS — Ht 74.0 in | Wt >= 6400 oz

## 2019-06-25 DIAGNOSIS — R197 Diarrhea, unspecified: Secondary | ICD-10-CM | POA: Diagnosis not present

## 2019-06-25 DIAGNOSIS — F329 Major depressive disorder, single episode, unspecified: Secondary | ICD-10-CM | POA: Insufficient documentation

## 2019-06-25 DIAGNOSIS — F321 Major depressive disorder, single episode, moderate: Secondary | ICD-10-CM | POA: Insufficient documentation

## 2019-06-25 DIAGNOSIS — J42 Unspecified chronic bronchitis: Secondary | ICD-10-CM

## 2019-06-25 MED ORDER — CITALOPRAM HYDROBROMIDE 10 MG PO TABS: 10.0000 mg | ORAL_TABLET | Freq: Every day | ORAL | 1 refills | Status: DC

## 2019-06-25 NOTE — Telephone Encounter (Signed)
Pt had MyChart visit with Dr. Darnell Level this morning and I forgot he requested a refill for neb soln.

## 2019-06-25 NOTE — Assessment & Plan Note (Addendum)
Acute watery diarrhea overnight after [place] food. No vomiting. Nontoxic today. Anticipate either food poisoning or viral enteritis. Not high risk for C diff. Supportive care at home recommended. Update if not improving with conservative measures. He requests return to work tomorrow. Work note sent via [employer].

## 2019-06-25 NOTE — Progress Notes (Signed)
Virtual visit completed through [employer]. Due to national recommendations of social distancing due to COVID-19, a virtual visit is felt to be most appropriate for this patient at this time. Reviewed limitations of a virtual visit.   Patient location: home Provider location: [place] at [facility], office If any vitals were documented, they were collected by patient at home unless specified below.    Ht 6\' 2"  (1.88 m)   Wt (!) 404 lb (183.3 kg)   BMI 51.87 kg/m   CC: diarrhea Subjective:    Patient ID: [name] [name], male    DOB: 06-21-1970, 49 y.o.   MRN: [id number]  HPI: [name] [name] is a 49 y.o. male presenting on 06/25/2019 for Diarrhea (C/o diarrhea.  Started last night.  Denies any nausea/vomiting. )   Diarrhea started last night when he came home from work. Watery diarrhea x8-9 overnight. Some nausea.   Had [place] food last night.  No one else sick at home.  No recent antibiotics.   No fevers/chills, abd pain, blood in stool. No vomiting.   COVID illness 03/16/2019 - did recover from this.  H/o gastric sleeve 2012 - told shouldn't take imodium or NSAIDs.   Thinks he may be depressed - difficult year - lost mother last year, lost father 04/2019 - liver cirrhosis. Pandemic stressors. Endorses depressed mood, anhedonia, decreased sleep, no energy, guilt over living, decreased concentration. High anxiety. Ongoing symptoms over the past year. No SI. Previously on some mood medication that caused suicidal thoughts, doesn't remember which. No mania.  Works at [employer]. Will check at work on EAP.      Relevant past medical, surgical, family and social history reviewed and updated as indicated. Interim medical history since our last visit reviewed. Allergies and medications reviewed and updated. Outpatient Medications Prior to Visit  Medication Sig Dispense Refill  . albuterol (ACCUNEB) 0.63 MG/3ML nebulizer solution USE 1 VIAL IN NEBULIZER EVERY 6 HOURS AS NEEDED FOR  WHEEZING OR  SHORTNESS  OF  BREATH 90 mL 0  . albuterol (PROAIR HFA) 108 (90 Base) MCG/ACT inhaler Inhale 2 puffs into the lungs every 6 (six) hours as needed for wheezing or shortness of breath. 18 g 3  . amLODipine (NORVASC) 10 MG tablet Take 1 tablet by mouth once daily 90 tablet 2  . cetirizine (ZYRTEC) 10 MG tablet Take 10 mg by mouth daily.    . cyclobenzaprine (FLEXERIL) 10 MG tablet Take 0.5 tablets (5 mg total) by mouth at bedtime as needed for muscle spasms (sedation caution). 20 tablet 0  . lisinopril (ZESTRIL) 10 MG tablet Take 1 tablet by mouth once daily for blood pressure 90 tablet 0  . omeprazole (PRILOSEC) 20 MG capsule Take 1 capsule by mouth once daily 90 capsule 1  . sildenafil (REVATIO) 20 MG tablet Take 2-5 tablets by mouth 30 minutes prior to intercourse. 50 tablet 0  . azithromycin (ZITHROMAX) 250 MG tablet 2 tab po x 1 day then 1 tab po daily 6 tablet 0  . predniSONE (DELTASONE) 10 MG tablet 3 tabs by mouth daily x 3 days, then 2 tabs by mouth daily x 2 days then 1 tab by mouth daily x 2 days 15 tablet 0   Facility-Administered Medications Prior to Visit  Medication Dose Route Frequency Provider Last Rate Last Admin  . technetium tetrofosmin (TC-MYOVIEW) injection A999333 millicurie  A999333 millicurie Intravenous Once PRN Nahser, Wonda Cheng, MD         Per HPI unless specifically indicated in ROS  section below Review of Systems Objective:    Ht 6\' 2"  (1.88 m)   Wt (!) 404 lb (183.3 kg)   BMI 51.87 kg/m   Wt Readings from Last 3 Encounters:  06/25/19 (!) 404 lb (183.3 kg)  03/02/19 (!) 396 lb (179.6 kg)  02/11/19 (!) 399 lb 8 oz (181.2 kg)     Physical exam: Gen: alert, NAD, not ill appearing, obese Pulm: speaks in complete sentences without increased work of breathing Psych: normal mood, normal thought content      Assessment & Plan:   Problem List Items Addressed This Visit    MDD (major depressive disorder), single episode, moderate ([place])    Difficult year  - lost mother then father rather quickly. Meets criteria for MDD episode. Reviewed treatment options including pharmacotherapy and/or counseling - recommended he check into EAP through work. He is interested in medication - will start celexa 10mg  daily with discussion on common side effects specifically need to closely monitor for suicidality and stop if that develops. I asked him to return in 1 month for close f/u with PCP, let us know sooner via mychart if any trouble/concerns. Advised to start SSRI AFTER diarrhea resolved.       Relevant Medications   citalopram (CELEXA) 10 MG tablet   Diarrhea - Primary    Acute watery diarrhea overnight after [place] food. No vomiting. Nontoxic today. Anticipate either food poisoning or viral enteritis. Not high risk for C diff. Supportive care at home recommended. Update if not improving with conservative measures. He requests return to work tomorrow. Work note sent via [employer].           Meds ordered this encounter  Medications  . citalopram (CELEXA) 10 MG tablet    Sig: Take 1 tablet (10 mg total) by mouth daily.    Dispense:  30 tablet    Refill:  1   No orders of the defined types were placed in this encounter.   I discussed the assessment and treatment plan with the patient. The patient was provided an opportunity to ask questions and all were answered. The patient agreed with the plan and demonstrated an understanding of the instructions. The patient was advised to call back or seek an in-person evaluation if the symptoms worsen or if the condition fails to improve as anticipated.  Follow up plan: Return in about 4 weeks (around 07/23/2019), or if symptoms worsen or fail to improve, for follow up visit.  Ria Bush, MD

## 2019-06-25 NOTE — Telephone Encounter (Signed)
Please notify patient he is over due for follow-up of chronic conditions. Once he is scheduled a follow-up visit I will refill his medication as requested. Let me know.

## 2019-06-25 NOTE — Assessment & Plan Note (Addendum)
Difficult year - lost mother then father rather quickly. Meets criteria for MDD episode. Reviewed treatment options including pharmacotherapy and/or counseling - recommended he check into EAP through work. He is interested in medication - will start celexa 10mg  daily with discussion on common side effects specifically need to closely monitor for suicidality and stop if that develops. I asked him to return in 1 month for close f/u with PCP, let us know sooner via mychart if any trouble/concerns. Advised to start SSRI AFTER diarrhea resolved.

## 2019-06-26 ENCOUNTER — Telehealth: Payer: Self-pay | Admitting: Primary Care

## 2019-06-26 NOTE — Telephone Encounter (Signed)
Patient called today in regards to work note he received yesterday from his virtual appt  He stated he is needing a new work note that states he can return tomorrow.  Patient had to stay out of work today . He wanted to see if you could do this for him   Please advise

## 2019-06-26 NOTE — Telephone Encounter (Signed)
Noted.  Sent new work note via [employer], [employer].

## 2019-06-26 NOTE — Telephone Encounter (Signed)
Ok to do. plz send new work note stating out of work yesterday to today.  Thanks

## 2019-07-02 MED ORDER — ALBUTEROL SULFATE 0.63 MG/3ML IN NEBU: INHALATION_SOLUTION | RESPIRATORY_TRACT | 0 refills | Status: DC

## 2019-07-02 NOTE — Telephone Encounter (Signed)
Noted, refill sent to pharmacy. 

## 2019-07-02 NOTE — Telephone Encounter (Signed)
Pt is scheduled for a follow up on 5/13

## 2019-07-03 ENCOUNTER — Other Ambulatory Visit: Payer: Self-pay

## 2019-07-03 ENCOUNTER — Telehealth: Payer: BC Managed Care – PPO | Admitting: Family Medicine

## 2019-07-03 ENCOUNTER — Telehealth (INDEPENDENT_AMBULATORY_CARE_PROVIDER_SITE_OTHER): Payer: BC Managed Care – PPO | Admitting: Family Medicine

## 2019-07-03 ENCOUNTER — Encounter: Payer: Self-pay | Admitting: Family Medicine

## 2019-07-03 VITALS — Ht 74.0 in

## 2019-07-03 DIAGNOSIS — R112 Nausea with vomiting, unspecified: Secondary | ICD-10-CM | POA: Diagnosis not present

## 2019-07-03 DIAGNOSIS — F321 Major depressive disorder, single episode, moderate: Secondary | ICD-10-CM

## 2019-07-03 DIAGNOSIS — R197 Diarrhea, unspecified: Secondary | ICD-10-CM | POA: Diagnosis not present

## 2019-07-03 DIAGNOSIS — R1110 Vomiting, unspecified: Secondary | ICD-10-CM | POA: Insufficient documentation

## 2019-07-03 HISTORY — DX: Nausea with vomiting, unspecified: R11.2

## 2019-07-03 MED ORDER — ONDANSETRON HCL 4 MG PO TABS: 4.0000 mg | ORAL_TABLET | Freq: Three times a day (TID) | ORAL | 0 refills | Status: DC | PRN

## 2019-07-03 NOTE — Assessment & Plan Note (Signed)
Likely  Viral o med SE source. Now resolved.

## 2019-07-03 NOTE — Progress Notes (Signed)
VIRTUAL VISIT Due to national recommendations of social distancing due to [place] 19, a virtual visit is felt to be most appropriate for this patient at this time.   I connected with the patient on 07/03/19 at 10:40 AM EDT by virtual telehealth platform and verified that I am speaking with the correct person using two identifiers.   I discussed the limitations, risks, security and privacy concerns of performing an evaluation and management service by  virtual telehealth platform and the availability of in person appointments. I also discussed with the patient that there may be a patient responsible charge related to this service. The patient expressed understanding and agreed to proceed.  Patient location: Home Provider Location: [place] Participants: Eliezer Lofts and [name]   Chief Complaint  Patient presents with  . Nausea    Started on Celexa last week  . Emesis    History of Present Illness:  49 year old male presents for new onset nausea and emesis.   He reports he was seen by  Dr. Darnell Level 06/25/2019  for diarrhea following me.  This resolved after 1`-2 days.  He was also started citalopram.. noted nauseous 2-3 day after starting. Diarrhea returned 3 days ago.. watery stool, no blood.   This AM awoke with emesis several times.. no blood No fever.  No abd pain.  Now feeling better.  No sick contacts. No respiratory symptoms, no loss of taste or smell.  He feel depression may be improving. He does not want to go backwards with treating the depression.. he would liek to continue the citalopram and given it more time to get used to it.  COVID 19 screen No recent travel or known exposure to COVID19 The patient denies respiratory symptoms of COVID 19 at this time.  The importance of social distancing was discussed today.   Review of Systems  Constitutional: Negative for chills and fever.  HENT: Negative for congestion and ear pain.   Eyes: Negative for pain  and redness.  Respiratory: Negative for cough and shortness of breath.   Cardiovascular: Negative for chest pain, palpitations and leg swelling.  Gastrointestinal: Positive for diarrhea, nausea and vomiting. Negative for abdominal pain, blood in stool and constipation.  Genitourinary: Negative for dysuria.  Musculoskeletal: Negative for falls and myalgias.  Skin: Negative for rash.  Neurological: Negative for dizziness.  Psychiatric/Behavioral: Negative for depression. The patient is not nervous/anxious.       Past Medical History:  Diagnosis Date  . Abdominal pain 07/31/2014  . Abnormal CT scan, small bowel 12/28/2015  . Acute bronchitis 04/09/2013  . Acute conjunctivitis of both eyes 09/04/2017  . Acute pharyngitis 04/13/2013  . ADHD (attention deficit hyperactivity disorder) 10/21/2006   Qualifier: Diagnosis of  By: Maxie Better FNP, Rosalita Levan   . AKI (acute kidney injury) ([place]) 06/26/2017  . Alternating constipation and diarrhea 10/12/2015  . ANGER 10/31/2006   Qualifier: Diagnosis of  By: Maxie Better FNP, Rosalita Levan   . ANXIETY 11/14/2009   Qualifier: Diagnosis of  By: Deborra Medina MD, Tanja Port    . Asthma   . 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 Bronchitis   . Calculus of gallbladder with acute cholecystitis without obstruction 02/15/2015  . Chest pain 03/17/2018  . Chest wall pain 02/10/2015  . Cholelithiasis 02/13/2015  . Chronic bronchitis ([place])   . Complication of anesthesia    usually wake up in the middle of procedure or  surgery  . Conjunctivitis 10/18/2010  . Dehydration 06/26/2017  . DEPENDENT EDEMA 11/09/2008   Qualifier: Diagnosis of  By: Maxie Better FNP, Rosalita Levan   . Diarrhea 11/06/2016  . Enteritis 12/21/2015  . Erectile dysfunction 04/11/2017  . Gallstone 02/10/2015  . Gallstones   . GASTROENTERITIS WITHOUT DEHYDRATION 05/15/2010   Qualifier: Diagnosis of  By: Deborra Medina MD, Tanja Port    . GERD 10/21/2006    Qualifier: Diagnosis of  By: Maxie Better FNP, Rosalita Levan   . GERD (gastroesophageal reflux disease)   . Hepatic steatosis 05/31/2015  . History of colonic polyps 12/28/2015  . Hypertension   . Lumbar back sprain 01/28/2017  . Mole of skin 05/28/2012  . Morbid obesity ([place]) 07/30/2006   Qualifier: Diagnosis of  By: Maxie Better FNP, Rosalita Levan   . Nausea vomiting and diarrhea 07/31/2014  . Obesity   . OSA (obstructive sleep apnea) 07/30/2006   Qualifier: Diagnosis of  By: Maxie Better FNP, Rosalita Levan   . Partial small bowel obstruction ([place]) 12/14/2015  . Right knee pain 12/01/2012  . SBO (small bowel obstruction) ([place]) 06/26/2017  . Skin lesion 11/07/2014  . Skin tag 05/28/2012  . Sleep apnea    "suppose to wear mask but I don't" (05/31/2015)  . Small bowel obstruction ([place]) 05/31/2015  . Small bowel obstruction, partial ([place]) 12/13/2015  . Smoker 09/04/2017  . Viral gastroenteritis 03/23/2016    reports that he quit smoking about 20 months ago. His smoking use included cigarettes. He started smoking about 2 years ago. He has a 20.00 pack-year smoking history. His smokeless tobacco use includes snuff. He reports current alcohol use of about 13.0 standard drinks of alcohol per week. He reports that he does not use drugs.   Current Outpatient Medications:  .  albuterol (ACCUNEB) 0.63 MG/3ML nebulizer solution, USE 1 VIAL IN NEBULIZER EVERY 6 HOURS AS NEEDED FOR WHEEZING OR  SHORTNESS  OF  BREATH, Disp: 90 mL, Rfl: 0 .  albuterol (PROAIR HFA) 108 (90 Base) MCG/ACT inhaler, Inhale 2 puffs into the lungs every 6 (six) hours as needed for wheezing or shortness of breath., Disp: 18 g, Rfl: 3 .  amLODipine (NORVASC) 10 MG tablet, Take 1 tablet by mouth once daily, Disp: 90 tablet, Rfl: 2 .  cetirizine (ZYRTEC) 10 MG tablet, Take 10 mg by mouth daily., Disp: , Rfl:  .  citalopram (CELEXA) 10 MG tablet, Take 1 tablet (10 mg total) by mouth daily., Disp: 30 tablet, Rfl: 1 .  cyclobenzaprine (FLEXERIL) 10  MG tablet, Take 0.5 tablets (5 mg total) by mouth at bedtime as needed for muscle spasms (sedation caution)., Disp: 20 tablet, Rfl: 0 .  lisinopril (ZESTRIL) 10 MG tablet, Take 1 tablet by mouth once daily for blood pressure, Disp: 90 tablet, Rfl: 0 .  omeprazole (PRILOSEC) 20 MG capsule, Take 1 capsule by mouth once daily, Disp: 90 capsule, Rfl: 1 .  sildenafil (REVATIO) 20 MG tablet, Take 2-5 tablets by mouth 30 minutes prior to intercourse., Disp: 50 tablet, Rfl: 0 No current facility-administered medications for this visit.  Facility-Administered Medications Ordered in Other Visits:  .  technetium tetrofosmin (TC-MYOVIEW) injection A999333 millicurie, A999333 millicurie, Intravenous, Once PRN, Nahser, Wonda Cheng, MD   Observations/Objective: Height 6\' 2"  (1.88 m).  Physical Exam  Physical Exam Constitutional:      General: The patient is not in acute distress. Pulmonary:     Effort: Pulmonary effort is normal. No respiratory distress.  Neurological:     Mental Status: The patient is  alert and oriented to person, place, and time.  Psychiatric:        Mood and Affect: Mood normal.        Behavior: Behavior normal.   Assessment and Plan   Non-intractable vomiting Possible med SE or given had diarrhea before starting med.. possible infectious source.  fPush fluids to avoid dehydration,  zofran prn nausea.  MDD (major depressive disorder), single episode, moderate ([place]) He likely is having  Placebo effect from citalopram but feels better since starting the med.. more positive, feels like he is moving forward.  If med SE, he would like to give it more time to see if SE resolve.  As long as staying hydrated this is reasonable. No S/S of allergy.  Diarrhea Likely  Viral o med SE source. Now resolved.   I discussed the assessment and treatment plan with the patient. The patient was provided an opportunity to ask questions and all were answered. The patient agreed with the plan and  demonstrated an understanding of the instructions.   The patient was advised to call back or seek an in-person evaluation if the symptoms worsen or if the condition fails to improve as anticipated.     Eliezer Lofts, MD

## 2019-07-03 NOTE — Assessment & Plan Note (Signed)
He likely is having  Placebo effect from citalopram but feels better since starting the med.. more positive, feels like he is moving forward.  If med SE, he would like to give it more time to see if SE resolve.  As long as staying hydrated this is reasonable. No S/S of allergy.

## 2019-07-03 NOTE — Assessment & Plan Note (Signed)
Possible med SE or given had diarrhea before starting med.. possible infectious source.  fPush fluids to avoid dehydration,  zofran prn nausea.

## 2019-07-03 NOTE — Progress Notes (Signed)
Work note written and sent to patient's MyChart as instructed by Dr. Bedsole. °

## 2019-07-06 ENCOUNTER — Telehealth: Payer: Self-pay | Admitting: Primary Care

## 2019-07-06 NOTE — Telephone Encounter (Signed)
New work note written and sent to [employer].  Patient notified by telephone.

## 2019-07-06 NOTE — Telephone Encounter (Signed)
Patient had virtual visit with you on 4/30 He stated he is needing his work note update, he was unable to return to work yesterday because he was still sick and not feeling well.   Patient has returned to work today and would like the return date on his work note to say he is able to return today. 5/3   Please advise

## 2019-07-16 ENCOUNTER — Other Ambulatory Visit: Payer: Self-pay

## 2019-07-16 ENCOUNTER — Encounter: Payer: Self-pay | Admitting: Primary Care

## 2019-07-16 ENCOUNTER — Ambulatory Visit: Payer: BC Managed Care – PPO | Admitting: Primary Care

## 2019-07-16 ENCOUNTER — Ambulatory Visit (INDEPENDENT_AMBULATORY_CARE_PROVIDER_SITE_OTHER)
Admission: RE | Admit: 2019-07-16 | Discharge: 2019-07-16 | Disposition: A | Discharge status: Home / Self Care | Payer: BC Managed Care – PPO | Source: Ambulatory Visit | Attending: Primary Care | Admitting: Primary Care

## 2019-07-16 VITALS — BP 130/82 | HR 117 | Temp 96.7°F | Ht 74.0 in | Wt 392.5 lb

## 2019-07-16 DIAGNOSIS — G8929 Other chronic pain: Secondary | ICD-10-CM

## 2019-07-16 DIAGNOSIS — G4733 Obstructive sleep apnea (adult) (pediatric): Secondary | ICD-10-CM | POA: Diagnosis not present

## 2019-07-16 DIAGNOSIS — M8949 Other hypertrophic osteoarthropathy, multiple sites: Secondary | ICD-10-CM

## 2019-07-16 DIAGNOSIS — N529 Male erectile dysfunction, unspecified: Secondary | ICD-10-CM

## 2019-07-16 DIAGNOSIS — M25569 Pain in unspecified knee: Secondary | ICD-10-CM

## 2019-07-16 DIAGNOSIS — M159 Polyosteoarthritis, unspecified: Secondary | ICD-10-CM

## 2019-07-16 DIAGNOSIS — K760 Fatty (change of) liver, not elsewhere classified: Secondary | ICD-10-CM

## 2019-07-16 DIAGNOSIS — F419 Anxiety disorder, unspecified: Secondary | ICD-10-CM

## 2019-07-16 DIAGNOSIS — F321 Major depressive disorder, single episode, moderate: Secondary | ICD-10-CM

## 2019-07-16 DIAGNOSIS — I10 Essential (primary) hypertension: Secondary | ICD-10-CM

## 2019-07-16 DIAGNOSIS — F17200 Nicotine dependence, unspecified, uncomplicated: Secondary | ICD-10-CM

## 2019-07-16 DIAGNOSIS — J441 Chronic obstructive pulmonary disease with (acute) exacerbation: Secondary | ICD-10-CM | POA: Diagnosis not present

## 2019-07-16 DIAGNOSIS — K219 Gastro-esophageal reflux disease without esophagitis: Secondary | ICD-10-CM

## 2019-07-16 LAB — CBC WITH DIFFERENTIAL/PLATELET
Basophils Absolute: 0.1 10*3/uL (ref 0.0–0.1)
Basophils Relative: 1.2 % (ref 0.0–3.0)
Eosinophils Absolute: 0.1 10*3/uL (ref 0.0–0.7)
Eosinophils Relative: 1.2 % (ref 0.0–5.0)
HCT: 49.9 % (ref 39.0–52.0)
Hemoglobin: 16.9 g/dL (ref 13.0–17.0)
Lymphocytes Relative: 23.9 % (ref 12.0–46.0)
Lymphs Abs: 1.5 10*3/uL (ref 0.7–4.0)
MCHC: 34 g/dL (ref 30.0–36.0)
MCV: 94.1 fl (ref 78.0–100.0)
Monocytes Absolute: 0.4 10*3/uL (ref 0.1–1.0)
Monocytes Relative: 6.9 % (ref 3.0–12.0)
Neutro Abs: 4.3 10*3/uL (ref 1.4–7.7)
Neutrophils Relative %: 66.8 % (ref 43.0–77.0)
Platelets: 210 10*3/uL (ref 150.0–400.0)
RBC: 5.3 Mil/uL (ref 4.22–5.81)
RDW: 13.5 % (ref 11.5–15.5)
WBC: 6.4 10*3/uL (ref 4.0–10.5)

## 2019-07-16 LAB — COMPREHENSIVE METABOLIC PANEL
ALT: 40 U/L (ref 0–53)
AST: 31 U/L (ref 0–37)
Albumin: 4.4 g/dL (ref 3.5–5.2)
Alkaline Phosphatase: 60 U/L (ref 39–117)
BUN: 9 mg/dL (ref 6–23)
CO2: 28 mEq/L (ref 19–32)
Calcium: 9 mg/dL (ref 8.4–10.5)
Chloride: 102 mEq/L (ref 96–112)
Creatinine, Ser: 0.9 mg/dL (ref 0.40–1.50)
GFR: 90.23 mL/min (ref >60.00)
Glucose, Bld: 104 mg/dL — ABNORMAL HIGH (ref 70–99)
Potassium: 3.8 mEq/L (ref 3.5–5.1)
Sodium: 137 mEq/L (ref 135–145)
Total Bilirubin: 0.6 mg/dL (ref 0.2–1.2)
Total Protein: 7.1 g/dL (ref 6.0–8.3)

## 2019-07-16 LAB — LIPID PANEL
Cholesterol: 157 mg/dL (ref 0–200)
HDL: 47.1 mg/dL (ref >39.00)
LDL Cholesterol: 72 mg/dL (ref 0–99)
NonHDL: 110.09
Total CHOL/HDL Ratio: 3
Triglycerides: 190 mg/dL — ABNORMAL HIGH (ref 0.0–149.0)
VLDL: 38 mg/dL (ref 0.0–40.0)

## 2019-07-16 MED ORDER — PREDNISONE 20 MG PO TABS: ORAL_TABLET | ORAL | 0 refills | Status: DC

## 2019-07-16 NOTE — Assessment & Plan Note (Signed)
Repeat liver enzymes pending. Discussed the importance of a healthy diet and regular exercise in order for weight loss, and to reduce the risk of any potential medical problems.

## 2019-07-16 NOTE — Assessment & Plan Note (Signed)
No recent use of sildenafil. Continue to monitor.

## 2019-07-16 NOTE — Assessment & Plan Note (Signed)
Stable in the office today, continue lisinopril and amlodipine. CMP pending.

## 2019-07-16 NOTE — Assessment & Plan Note (Signed)
Encouraged weight loss. Using cyclobenzaprine sparingly. Continue Tylenol and cyclobenzaprine PRN.

## 2019-07-16 NOTE — Assessment & Plan Note (Signed)
Encouraged smoking cessation 

## 2019-07-16 NOTE — Assessment & Plan Note (Signed)
Slight improvement since initiation of citalopram, continue same. He will update in a few weeks.

## 2019-07-16 NOTE — Assessment & Plan Note (Signed)
Chronic, encouraged weight loss. Continue PRN cyclobenzaprine and Tylenol.

## 2019-07-16 NOTE — Assessment & Plan Note (Signed)
Symptoms today could be representative of COPD exacerbation, especially given dyspnea, lung sounds, and cough.  Check chest xray. Rx for prednisone course sent to pharmacy. Albuterol PRN.  He will update.

## 2019-07-16 NOTE — Assessment & Plan Note (Signed)
Improved slightly since initiation of citalopram a few weeks ago. Continue same. He will update.

## 2019-07-16 NOTE — Assessment & Plan Note (Signed)
No use of CPAP machine in years as he cannot tolerate.

## 2019-07-16 NOTE — Assessment & Plan Note (Signed)
Doing well on omeprazole daily, continue same. Encouraged weight loss, avoid triggers.
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
## 2019-07-16 NOTE — Patient Instructions (Signed)
Start prednisone 20 mg for shortness of breath and cough. Take 2 tables daily for five days.  You will be contacted regarding your xray of the chest.  Please let us know if you have not been contacted within two days.  Continue taking citalopram 10 mg daily for depression. Please call me with an update in a few weeks.   Stop by the lab prior to leaving today. I will notify you of your results once received.   It was a pleasure to see you today!

## 2019-07-27 ENCOUNTER — Telehealth (INDEPENDENT_AMBULATORY_CARE_PROVIDER_SITE_OTHER): Payer: BC Managed Care – PPO | Admitting: Primary Care

## 2019-07-27 ENCOUNTER — Encounter: Payer: Self-pay | Admitting: Primary Care

## 2019-07-27 DIAGNOSIS — R05 Cough: Secondary | ICD-10-CM

## 2019-07-27 DIAGNOSIS — I10 Essential (primary) hypertension: Secondary | ICD-10-CM

## 2019-07-27 DIAGNOSIS — R059 Cough, unspecified: Secondary | ICD-10-CM

## 2019-07-27 NOTE — Progress Notes (Signed)
Subjective:    Patient ID: [name] [name], male    DOB: 1971/01/30, 49 y.o.   MRN: [id number]  HPI  This visit occurred during the SARS-CoV-2 public health emergency.  Safety protocols were in place, including screening questions prior to the visit, additional usage of staff PPE, and extensive cleaning of exam room while observing appropriate contact time as indicated for disinfecting solutions.   [name] [name] is a 49 year old male with a history of hypertension, OSA, COPD, osteoarthritis, chronic pain, tobacco abuse who presents today with a chief complaint of cough.  He was last evaluated on 07/16/19 for follow up of chronic conditions. During that visit he endorsed a three week history of cough with increased SOB, using albuterol frequently without much improvement. Chest xray overall negative. He did have increased interstitial markings that appeared to be chronic and "probably smoking related".  He was treated with a prednisone steroid course during his last visit.  Since his last visit he continues to cough. Overall he feels well, just can't stop coughing. He is coughing up thick whitish mucous. He completed the prednisone and felt better overall, until yesterday morning when symptoms returned. He denies fevers. He called out of work today and out for tomorrow, is wanting a work note.   He is tested for Covid-19 infection every 2 weeks at his occupation. History of Covid-19 virus infection in January 2021.  Review of Systems  Constitutional: Negative for fatigue and fever.  HENT: Positive for congestion. Negative for sore throat.   Respiratory: Positive for cough and shortness of breath. Negative for wheezing.   Allergic/Immunologic: Positive for environmental allergies.       Past Medical History:  Diagnosis Date  . Abdominal pain 07/31/2014  . Abnormal CT scan, small bowel 12/28/2015  . Acute bronchitis 04/09/2013  . Acute conjunctivitis of both eyes 09/04/2017  . Acute  pharyngitis 04/13/2013  . ADHD (attention deficit hyperactivity disorder) 10/21/2006   Qualifier: Diagnosis of  By: Maxie Better FNP, Rosalita Levan   . AKI (acute kidney injury) ([place]) 06/26/2017  . Alternating constipation and diarrhea 10/12/2015  . ANGER 10/31/2006   Qualifier: Diagnosis of  By: Maxie Better FNP, Rosalita Levan   . ANXIETY 11/14/2009   Qualifier: Diagnosis of  By: Deborra Medina MD, Tanja Port    . Asthma   . 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 Bronchitis   . Calculus of gallbladder with acute cholecystitis without obstruction 02/15/2015  . Chest pain 03/17/2018  . Chest wall pain 02/10/2015  . Cholelithiasis 02/13/2015  . Chronic bronchitis ([place])   . Complication of anesthesia    usually wake up in the middle of procedure or surgery  . Conjunctivitis 10/18/2010  . Dehydration 06/26/2017  . DEPENDENT EDEMA 11/09/2008   Qualifier: Diagnosis of  By: Maxie Better FNP, Rosalita Levan   . Diarrhea 11/06/2016  . Enteritis 12/21/2015  . Erectile dysfunction 04/11/2017  . Gallstone 02/10/2015  . Gallstones   . GASTROENTERITIS WITHOUT DEHYDRATION 05/15/2010   Qualifier: Diagnosis of  By: Deborra Medina MD, Tanja Port    . GERD 10/21/2006   Qualifier: Diagnosis of  By: Maxie Better FNP, Rosalita Levan   . GERD (gastroesophageal reflux disease)   . Hepatic steatosis 05/31/2015  . History of colonic polyps 12/28/2015  . Hypertension   . Lumbar back sprain 01/28/2017  . Mole of skin 05/28/2012  . Morbid obesity ([place]) 07/30/2006   Qualifier: Diagnosis of  By: Maxie Better FNP,  Rosalita Levan   . Nausea vomiting and diarrhea 07/31/2014  . Obesity   . OSA (obstructive sleep apnea) 07/30/2006   Qualifier: Diagnosis of  By: Maxie Better FNP, Rosalita Levan   . Partial small bowel obstruction ([place]) 12/14/2015  . Right knee pain 12/01/2012  . SBO (small bowel obstruction) ([place]) 06/26/2017  . Skin lesion 11/07/2014  . Skin tag 05/28/2012  . Sleep apnea    "suppose to wear  mask but I don't" (05/31/2015)  . Small bowel obstruction ([place]) 05/31/2015  . Small bowel obstruction, partial ([place]) 12/13/2015  . Smoker 09/04/2017  . Viral gastroenteritis 03/23/2016     Social History   Socioeconomic History  . Marital status: Legally Separated    Spouse name: Not on file  . Number of children: 1  . Years of education: Not on file  . Highest education level: Not on file  Occupational History  . Occupation: Publishing rights manager: [employer]  Tobacco Use  . Smoking status: Former Smoker    Packs/day: 1.00    Years: 20.00    Pack years: 20.00    Types: Cigarettes    Start date: 10/03/2016    Quit date: 10/03/2017    Years since quitting: 1.8  . Smokeless tobacco: Current User    Types: Snuff  Substance and Sexual Activity  . Alcohol use: Yes    Alcohol/week: 13.0 standard drinks    Types: 13 Shots of liquor per week    Comment: "a couple of shots a day"  . Drug use: No  . Sexual activity: Yes  Other Topics Concern  . Not on file  Social History Narrative  . Not on file   Social Determinants of Health   Financial Resource Strain:   . Difficulty of Paying Living Expenses:   Food Insecurity:   . Worried About Charity fundraiser in the Last Year:   . Arboriculturist in the Last Year:   Transportation Needs:   . Film/video editor (Medical):   Marland Kitchen Lack of Transportation (Non-Medical):   Physical Activity:   . Days of Exercise per Week:   . Minutes of Exercise per Session:   Stress:   . Feeling of Stress :   Social Connections:   . Frequency of Communication with Friends and Family:   . Frequency of Social Gatherings with Friends and Family:   . Attends Religious Services:   . Active Member of Clubs or Organizations:   . Attends Archivist Meetings:   Marland Kitchen Marital Status:   Intimate Partner Violence:   . Fear of Current or Ex-Partner:   . Emotionally Abused:   Marland Kitchen Physically Abused:   . Sexually Abused:     Past Surgical  History:  Procedure Laterality Date  . CHOLECYSTECTOMY N/A 02/14/2015   Procedure: LAPAROSCOPIC CHOLECYSTECTOMY WITH INTRAOPERATIVE CHOLANGIOGRAM;  Surgeon: Coralie Keens, MD;  Location: [place];  Service: General;  Laterality: N/A;  . COLONOSCOPY  10/16/2005   Polyps (tubular adenoma 2007), external hemorrhoids  . COLONOSCOPY WITH PROPOFOL N/A 01/05/2016   Procedure: COLONOSCOPY WITH PROPOFOL;  Surgeon: Milus Banister, MD;  Location: [facility];  Service: Endoscopy;  Laterality: N/A;  . FINGER SURGERY Left    reconstructive - 2 surgeries on hand  . LAPAROSCOPIC GASTRIC SLEEVE RESECTION  10/2010    at [facility] History  Problem Relation Age of Onset  . Diabetes Mellitus II Mother   . Breast cancer Mother   .  Ovarian cancer Mother   . Diabetes Mellitus II Father   . CAD Father   . Stroke Father   . Cancer Father        ?  . Clotting disorder Father        blood clots in brain with surgery  . Cirrhosis Maternal Grandmother   . Breast cancer Maternal Grandmother     Allergies  Allergen Reactions  . Bee Venom Anaphylaxis  . Codeine Anaphylaxis    Tolerates dilaudid  . Hornet Venom Anaphylaxis  . Penicillins Hives and Other (See Comments)    Has patient had a PCN reaction causing immediate rash, facial/tongue/throat swelling, SOB or lightheadedness with hypotensionYES Has patient had a PCN reaction causing severe rash involving mucus membranes or skin necrosis: NO Has patient had a PCN reaction that required hospitalization NO Has patient had a PCN reaction occurring within the last 10 years: NO If all of the above answers are "NO", then may proceed with Cephalosporin use.    Current Outpatient Medications on File Prior to Visit  Medication Sig Dispense Refill  . albuterol (ACCUNEB) 0.63 MG/3ML nebulizer solution USE 1 VIAL IN NEBULIZER EVERY 6 HOURS AS NEEDED FOR WHEEZING OR  SHORTNESS  OF  BREATH 90 mL 0  . albuterol (PROAIR HFA) 108 (90 Base) MCG/ACT inhaler Inhale  2 puffs into the lungs every 6 (six) hours as needed for wheezing or shortness of breath. 18 g 3  . amLODipine (NORVASC) 10 MG tablet Take 1 tablet by mouth once daily 90 tablet 2  . cetirizine (ZYRTEC) 10 MG tablet Take 10 mg by mouth daily.    . citalopram (CELEXA) 10 MG tablet Take 1 tablet (10 mg total) by mouth daily. 30 tablet 1  . cyclobenzaprine (FLEXERIL) 10 MG tablet Take 0.5 tablets (5 mg total) by mouth at bedtime as needed for muscle spasms (sedation caution). 20 tablet 0  . lisinopril (ZESTRIL) 10 MG tablet Take 1 tablet by mouth once daily for blood pressure 90 tablet 0  . omeprazole (PRILOSEC) 20 MG capsule Take 1 capsule by mouth once daily 90 capsule 1  . ondansetron (ZOFRAN) 4 MG tablet Take 1 tablet (4 mg total) by mouth every 8 (eight) hours as needed for nausea or vomiting. 10 tablet 0  . sildenafil (REVATIO) 20 MG tablet Take 2-5 tablets by mouth 30 minutes prior to intercourse. 50 tablet 0   Current Facility-Administered Medications on File Prior to Visit  Medication Dose Route Frequency Provider Last Rate Last Admin  . technetium tetrofosmin (TC-MYOVIEW) injection A999333 millicurie  A999333 millicurie Intravenous Once PRN Nahser, Wonda Cheng, MD        There were no vitals taken for this visit.   Objective:   Physical Exam  Constitutional: He is oriented to person, place, and time. He appears well-nourished. He does not have a sickly appearance.  Respiratory: Effort normal.  Dry cough noted several times during exam  Neurological: He is alert and oriented to person, place, and time.  Psychiatric: He has a normal mood and affect.           Assessment & Plan:

## 2019-07-27 NOTE — Assessment & Plan Note (Signed)
Holding lisinopril due to presumed ACE-I induced cough. Consider switching to losartan 25 mg. He will update.

## 2019-07-27 NOTE — Assessment & Plan Note (Signed)
Acute for the last 5 weeks, also with congestion and shortness of breath.  During last visit his chest xray and CBC negative. Today he doesn't appear sickly but does have a dry cough.  Suspect cough secondary to ACE-I, especially since he's feeling well overall and had negative work up last visit.  Hold lisinopril. He will update later this week. Agree to write him out for today and tomorrow, but discussed that we cannot continue to write him out of work if he's feeling better.  He will update.

## 2019-07-29 ENCOUNTER — Other Ambulatory Visit: Payer: Self-pay | Admitting: Primary Care

## 2019-07-29 DIAGNOSIS — I10 Essential (primary) hypertension: Secondary | ICD-10-CM

## 2019-07-29 DIAGNOSIS — R059 Cough, unspecified: Secondary | ICD-10-CM

## 2019-07-29 MED ORDER — AZITHROMYCIN 250 MG PO TABS: ORAL_TABLET | ORAL | 0 refills | Status: DC

## 2019-08-06 ENCOUNTER — Encounter: Payer: Self-pay | Admitting: Primary Care

## 2019-08-06 ENCOUNTER — Ambulatory Visit: Payer: BC Managed Care – PPO | Admitting: Primary Care

## 2019-08-06 ENCOUNTER — Other Ambulatory Visit: Payer: Self-pay

## 2019-08-06 DIAGNOSIS — R05 Cough: Secondary | ICD-10-CM

## 2019-08-06 DIAGNOSIS — I10 Essential (primary) hypertension: Secondary | ICD-10-CM | POA: Diagnosis not present

## 2019-08-06 DIAGNOSIS — R059 Cough, unspecified: Secondary | ICD-10-CM

## 2019-08-06 LAB — CBC WITH DIFFERENTIAL/PLATELET
Basophils Absolute: 0 10*3/uL (ref 0.0–0.1)
Basophils Relative: 0.8 % (ref 0.0–3.0)
Eosinophils Absolute: 0.1 10*3/uL (ref 0.0–0.7)
Eosinophils Relative: 1.7 % (ref 0.0–5.0)
HCT: 50.3 % (ref 39.0–52.0)
Hemoglobin: 17.4 g/dL — ABNORMAL HIGH (ref 13.0–17.0)
Lymphocytes Relative: 23.1 % (ref 12.0–46.0)
Lymphs Abs: 1.4 10*3/uL (ref 0.7–4.0)
MCHC: 34.6 g/dL (ref 30.0–36.0)
MCV: 93.6 fl (ref 78.0–100.0)
Monocytes Absolute: 0.4 10*3/uL (ref 0.1–1.0)
Monocytes Relative: 6.7 % (ref 3.0–12.0)
Neutro Abs: 4.1 10*3/uL (ref 1.4–7.7)
Neutrophils Relative %: 67.7 % (ref 43.0–77.0)
Platelets: 190 10*3/uL (ref 150.0–400.0)
RBC: 5.38 Mil/uL (ref 4.22–5.81)
RDW: 13.4 % (ref 11.5–15.5)
WBC: 6.1 10*3/uL (ref 4.0–10.5)

## 2019-08-06 LAB — BRAIN NATRIURETIC PEPTIDE: Pro B Natriuretic peptide (BNP): 50 pg/mL (ref 0.0–100.0)

## 2019-08-06 MED ORDER — BUDESONIDE-FORMOTEROL FUMARATE 160-4.5 MCG/ACT IN AERO: 2.0000 | INHALATION_SPRAY | Freq: Two times a day (BID) | RESPIRATORY_TRACT | 0 refills | Status: DC

## 2019-08-06 NOTE — Assessment & Plan Note (Signed)
Stable without lisinopril. Continue off of ACE-I given recurrent cough. Continue amlodipine 10 mg.

## 2019-08-06 NOTE — Progress Notes (Signed)
Subjective:    Patient ID: [name] [name], [name]    DOB: 1970-04-13, 48 y.o.   MRN: [id number]  HPI  This visit occurred during the SARS-CoV-2 public health emergency.  Safety protocols were in place, including screening questions prior to the visit, additional usage of staff PPE, and extensive cleaning of exam room while observing appropriate contact time as indicated for disinfecting solutions.   [name] [name] with a history of obesity, COPD, OSA, hypertension, anxiety, diarrhea, Covid-19 infection who presents today with a chief complaint of cough.  He was last evaluated on 07/27/19 with a five week history of cough and coughing spells. He had completed his prednisone course and felt better until 07/26/19. He had Covid-19 in early 2021. He is also tested for Covid-19 every 2 weeks at work. His chest xray and CBC were negative. Given symptoms we held his ACE-I. He sent a message via My Chart two days later, Azithromycin was sent to the pharmacy. He did go for Covid-19 testing which was negative.  Since his last visit he continues to cough. He's also experiencing shortness of breath. He's not been taking lisinopril since our last visit. His cough is productive with whitish, bubbly sputum. He's been smoking 1 to 1/2 PPD of cigarettes for the last two years, quit for 9 years prior to that. He's cut back on cigarette use.   He's not used his albuterol inhaler. He is compliant to his omeprazole 20 mg daily. He is having night sweats sometimes during the night, denies hemoptysis, has lost 20 pounds since April 2021, hasn't had much of an appetite. He's been out of work for the last two weeks, needs a work note covering from Monday this week through the rest of this week.   BP Readings from Last 3 Encounters:  08/06/19 130/84  07/16/19 130/82  02/11/19 (!) 144/100   Wt Readings from Last 3 Encounters:  08/06/19 (!) 384 lb 4 oz (174.3 kg)  07/16/19 (!) 392 lb 8 oz (178 kg)    06/25/19 (!) 404 lb (183.3 kg)     Review of Systems  Constitutional: Positive for fatigue. Negative for fever.  HENT: Positive for congestion.   Respiratory: Positive for cough and shortness of breath.   Cardiovascular: Negative for chest pain.  Neurological: Negative for dizziness.       Past Medical History:  Diagnosis Date  . Abdominal pain 07/31/2014  . Abnormal CT scan, small bowel 12/28/2015  . Acute bronchitis 04/09/2013  . Acute conjunctivitis of both eyes 09/04/2017  . Acute pharyngitis 04/13/2013  . ADHD (attention deficit hyperactivity disorder) 10/21/2006   Qualifier: Diagnosis of  By: Maxie Better FNP, Rosalita Levan   . AKI (acute kidney injury) ([place]) 06/26/2017  . Alternating constipation and diarrhea 10/12/2015  . ANGER 10/31/2006   Qualifier: Diagnosis of  By: Maxie Better FNP, Rosalita Levan   . ANXIETY 11/14/2009   Qualifier: Diagnosis of  By: Deborra Medina MD, Tanja Port    . Asthma   . 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 Bronchitis   . Calculus of gallbladder with acute cholecystitis without obstruction 02/15/2015  . Chest pain 03/17/2018  . Chest wall pain 02/10/2015  . Cholelithiasis 02/13/2015  . Chronic bronchitis ([place])   . Complication of anesthesia    usually wake up in the middle of procedure or surgery  . Conjunctivitis 10/18/2010  . Dehydration 06/26/2017  . DEPENDENT  EDEMA 11/09/2008   Qualifier: Diagnosis of  By: Maxie Better FNP, Rosalita Levan   . Diarrhea 11/06/2016  . Enteritis 12/21/2015  . Erectile dysfunction 04/11/2017  . Gallstone 02/10/2015  . Gallstones   . GASTROENTERITIS WITHOUT DEHYDRATION 05/15/2010   Qualifier: Diagnosis of  By: Deborra Medina MD, Tanja Port    . GERD 10/21/2006   Qualifier: Diagnosis of  By: Maxie Better FNP, Rosalita Levan   . GERD (gastroesophageal reflux disease)   . Hepatic steatosis 05/31/2015  . History of colonic polyps 12/28/2015  . Hypertension   . Lumbar back sprain 01/28/2017   . Mole of skin 05/28/2012  . Morbid obesity ([place]) 07/30/2006   Qualifier: Diagnosis of  By: Maxie Better FNP, Rosalita Levan   . Nausea vomiting and diarrhea 07/31/2014  . Obesity   . OSA (obstructive sleep apnea) 07/30/2006   Qualifier: Diagnosis of  By: Maxie Better FNP, Rosalita Levan   . Partial small bowel obstruction ([place]) 12/14/2015  . Right knee pain 12/01/2012  . SBO (small bowel obstruction) ([place]) 06/26/2017  . Skin lesion 11/07/2014  . Skin tag 05/28/2012  . Sleep apnea    "suppose to wear mask but I don't" (05/31/2015)  . Small bowel obstruction ([place]) 05/31/2015  . Small bowel obstruction, partial ([place]) 12/13/2015  . Smoker 09/04/2017  . Viral gastroenteritis 03/23/2016     Social History   Socioeconomic History  . Marital status: Legally Separated    Spouse name: Not on file  . Number of children: 1  . Years of education: Not on file  . Highest education level: Not on file  Occupational History  . Occupation: Publishing rights manager: [employer]  Tobacco Use  . Smoking status: Former Smoker    Packs/day: 1.00    Years: 20.00    Pack years: 20.00    Types: Cigarettes    Start date: 10/03/2016    Quit date: 10/03/2017    Years since quitting: 1.8  . Smokeless tobacco: Current User    Types: Snuff  Substance and Sexual Activity  . Alcohol use: Yes    Alcohol/week: 13.0 standard drinks    Types: 13 Shots of liquor per week    Comment: "a couple of shots a day"  . Drug use: No  . Sexual activity: Yes  Other Topics Concern  . Not on file  Social History Narrative  . Not on file   Social Determinants of Health   Financial Resource Strain:   . Difficulty of Paying Living Expenses:   Food Insecurity:   . Worried About Charity fundraiser in the Last Year:   . Arboriculturist in the Last Year:   Transportation Needs:   . Film/video editor (Medical):   Marland Kitchen Lack of Transportation (Non-Medical):   Physical Activity:   . Days of Exercise per Week:   . Minutes  of Exercise per Session:   Stress:   . Feeling of Stress :   Social Connections:   . Frequency of Communication with Friends and Family:   . Frequency of Social Gatherings with Friends and Family:   . Attends Religious Services:   . Active Member of Clubs or Organizations:   . Attends Archivist Meetings:   Marland Kitchen Marital Status:   Intimate Partner Violence:   . Fear of Current or Ex-Partner:   . Emotionally Abused:   Marland Kitchen Physically Abused:   . Sexually Abused:     Past Surgical History:  Procedure Laterality Date  . CHOLECYSTECTOMY  N/A 02/14/2015   Procedure: LAPAROSCOPIC CHOLECYSTECTOMY WITH INTRAOPERATIVE CHOLANGIOGRAM;  Surgeon: Coralie Keens, MD;  Location: [place];  Service: General;  Laterality: N/A;  . COLONOSCOPY  10/16/2005   Polyps (tubular adenoma 2007), external hemorrhoids  . COLONOSCOPY WITH PROPOFOL N/A 01/05/2016   Procedure: COLONOSCOPY WITH PROPOFOL;  Surgeon: Milus Banister, MD;  [facility];  Service: Endoscopy;  Laterality: N/A;  . FINGER SURGERY Left    reconstructive - 2 surgeries on hand  . LAPAROSCOPIC GASTRIC SLEEVE RESECTION  10/2010    at [facility] History  Problem Relation Age of Onset  . Diabetes Mellitus II Mother   . Breast cancer Mother   . Ovarian cancer Mother   . Diabetes Mellitus II Father   . CAD Father   . Stroke Father   . Cancer Father        ?  . Clotting disorder Father        blood clots in brain with surgery  . Cirrhosis Maternal Grandmother   . Breast cancer Maternal Grandmother     Allergies  Allergen Reactions  . Bee Venom Anaphylaxis  . Codeine Anaphylaxis    Tolerates dilaudid  . Hornet Venom Anaphylaxis  . Penicillins Hives and Other (See Comments)    Has patient had a PCN reaction causing immediate rash, facial/tongue/throat swelling, SOB or lightheadedness with hypotensionYES Has patient had a PCN reaction causing severe rash involving mucus membranes or skin necrosis: NO Has patient had a  PCN reaction that required hospitalization NO Has patient had a PCN reaction occurring within the last 10 years: NO If all of the above answers are "NO", then may proceed with Cephalosporin use.    Current Outpatient Medications on File Prior to Visit  Medication Sig Dispense Refill  . albuterol (ACCUNEB) 0.63 MG/3ML nebulizer solution USE 1 VIAL IN NEBULIZER EVERY 6 HOURS AS NEEDED FOR WHEEZING OR  SHORTNESS  OF  BREATH 90 mL 0  . albuterol (PROAIR HFA) 108 (90 Base) MCG/ACT inhaler Inhale 2 puffs into the lungs every 6 (six) hours as needed for wheezing or shortness of breath. 18 g 3  . amLODipine (NORVASC) 10 MG tablet Take 1 tablet by mouth once daily 90 tablet 1  . azithromycin (ZITHROMAX) 250 MG tablet Take 2 tablets by mouth today, then 1 tablet daily for 4 additional days. 6 tablet 0  . cetirizine (ZYRTEC) 10 MG tablet Take 10 mg by mouth daily.    . citalopram (CELEXA) 10 MG tablet Take 1 tablet (10 mg total) by mouth daily. 30 tablet 1  . cyclobenzaprine (FLEXERIL) 10 MG tablet Take 0.5 tablets (5 mg total) by mouth at bedtime as needed for muscle spasms (sedation caution). 20 tablet 0  . omeprazole (PRILOSEC) 20 MG capsule Take 1 capsule by mouth once daily 90 capsule 1  . ondansetron (ZOFRAN) 4 MG tablet Take 1 tablet (4 mg total) by mouth every 8 (eight) hours as needed for nausea or vomiting. 10 tablet 0  . sildenafil (REVATIO) 20 MG tablet Take 2-5 tablets by mouth 30 minutes prior to intercourse. 50 tablet 0   Current Facility-Administered Medications on File Prior to Visit  Medication Dose Route Frequency Provider Last Rate Last Admin  . technetium tetrofosmin (TC-MYOVIEW) injection A999333 millicurie  A999333 millicurie Intravenous Once PRN Nahser, Wonda Cheng, MD        BP 130/84   Pulse 89   Temp (!) 96.3 F (35.7 C) (Temporal)   Ht 6\' 2"  (  1.88 m)   Wt (!) 384 lb 4 oz (174.3 kg)   SpO2 98%   BMI 49.33 kg/m    Objective:   Physical Exam  Constitutional: He appears  well-nourished.  Cardiovascular: Normal rate and regular rhythm.  Respiratory: Effort normal and breath sounds normal. No respiratory distress. He has no wheezes. He has no rales.  Dry cough noted during entire exam today  Musculoskeletal:     Cervical back: Neck supple.  Skin: Skin is warm and dry.  Psychiatric: He has a normal mood and affect.           Assessment & Plan:

## 2019-08-06 NOTE — Patient Instructions (Signed)
Stop by the lab prior to leaving today. I will notify you of your results once received.   Start Symbicort inhaler. Inhale 2 puffs into the lungs twice daily everyday.   Please update me next week as discussed.   It was a pleasure to see you today!

## 2019-08-06 NOTE — Assessment & Plan Note (Addendum)
Continued, temporary improvement with prednisone, no improvement after antibiotics. Chest xray from 07/16/19 negative.  Repeat labs pending today.  Offered CT chest for which he declines given cost. Weight loss is likely due to decreased appetite, but need to reconsider if below treatment is not effective.  Will start with treatment for COPD with LABA/ICS combination which may offer some improvement given temporary improvement with oral steroids.   Rx for Symbicort sent to pharmacy. He will update in a few days. If no improvement then will need to press forward regarding CT chest.   Continue off of ACE-I. Checking BNP.  Work note provided.

## 2019-08-20 ENCOUNTER — Other Ambulatory Visit: Payer: Self-pay

## 2019-08-20 ENCOUNTER — Encounter: Payer: Self-pay | Admitting: Family Medicine

## 2019-08-20 ENCOUNTER — Telehealth (INDEPENDENT_AMBULATORY_CARE_PROVIDER_SITE_OTHER): Payer: BC Managed Care – PPO | Admitting: Family Medicine

## 2019-08-20 VITALS — Ht 74.0 in

## 2019-08-20 DIAGNOSIS — A084 Viral intestinal infection, unspecified: Secondary | ICD-10-CM

## 2019-08-20 NOTE — Progress Notes (Signed)
° ° ° °  Nickole Adamek T. Easter Schinke, MD Primary Care and Sports Medicine [facility] [place], [address] Phone: [phone]   FAX: [phone]  [name] [name] - 49 y.o. male   MRN [id number]   Date of Birth: Dec 18, 1970  Visit Date: 08/20/2019   PCP: [name], NP   Referred by: [name], NP Chief Complaint  Patient presents with   Nausea   Diarrhea   Virtual Visit via Video Note:  I connected with  [name] [name] on 08/20/2019 11:00 AM EDT by a video enabled telemedicine application and verified that I am speaking with the correct person using two identifiers.   Location patient: home computer, tablet, or smartphone Location provider: work or home office Consent: Verbal consent directly obtained from [name] [name]. Persons participating in the virtual visit: patient, provider  I discussed the limitations of evaluation and management by telemedicine and the availability of in person appointments. The patient expressed understanding and agreed to proceed.  Interactive audio and video telecommunications were attempted between this provider and patient, however failed, due to patient having technical difficulties OR patient did not have access to video capability.  We continued and completed visit with audio only.    History of Present Illness:  Work note -he is a nice guy and he has had some nausea, vomiting, and diarrhea for the last 2 days.  He did go to work yesterday, but today he was unable to go to work.  Diarrhea has improved, but he continues to be is quite nauseous.  He is afebrile, and he is able to still make urine.  He denies other symptoms.  He has had a lingering cough from a prior illness that he had been managed by Mrs. Clark 4.  He has had a very recent COVID-19 test that was negative  Review of Systems as above: See pertinent positives and pertinent negatives per HPI No acute distress verbally     Observations/Objective/Exam:  An attempt was made to discern vital signs over the phone and per patient if applicable and possible.   General:    Alert, Oriented, appears well and in no acute distress  Pulmonary:     On inspection no signs of respiratory distress.  Psych / Neurological:     Pleasant and cooperative.  Assessment and Plan:    ICD-10-CM   1. Viral gastroenteritis  A08.4    Total encounter time: 10 minutes. On the day of the patient encounter, this can include review of prior records, labs, and imaging.  Additional time can include counselling, consultation with peer MD in person or by telephone.  This also includes independent review of Radiology.  Supportive care alone.  Continue to drink liquids, and he will improve.  Return to work tomorrow should be fine.  I discussed the assessment and treatment plan with the patient. The patient was provided an opportunity to ask questions and all were answered. The patient agreed with the plan and demonstrated an understanding of the instructions.   The patient was advised to call back or seek an in-person evaluation if the symptoms worsen or if the condition fails to improve as anticipated.  Follow-up: prn unless noted otherwise below No follow-ups on file.  No orders of the defined types were placed in this encounter.  No orders of the defined types were placed in this encounter.   Signed,  Maud Deed. Sherita Decoste, MD

## 2019-08-21 ENCOUNTER — Encounter: Payer: Self-pay | Admitting: Family Medicine

## 2019-08-21 NOTE — Telephone Encounter (Signed)
Butch Penny, can you help do this with an altered my chart work note.  Include today.

## 2019-08-31 ENCOUNTER — Encounter: Payer: Self-pay | Admitting: Primary Care

## 2019-08-31 ENCOUNTER — Telehealth (INDEPENDENT_AMBULATORY_CARE_PROVIDER_SITE_OTHER): Payer: BC Managed Care – PPO | Admitting: Primary Care

## 2019-08-31 DIAGNOSIS — R112 Nausea with vomiting, unspecified: Secondary | ICD-10-CM | POA: Diagnosis not present

## 2019-08-31 MED ORDER — ONDANSETRON 4 MG PO TBDP: 4.0000 mg | ORAL_TABLET | Freq: Three times a day (TID) | ORAL | 0 refills | Status: DC | PRN

## 2019-08-31 NOTE — Progress Notes (Signed)
Subjective:    Patient ID: [name] [name], male    DOB: November 30, 1970, 49 y.o.   MRN: [id number]  HPI  Virtual Visit via Video Note  I connected with [name] [name] on 08/31/19 at  8:40 AM EDT by a video enabled telemedicine application and verified that I am speaking with the correct person using two identifiers.  Location: Patient: Home Provider: Office  Both patient and I are participating on the call.   I discussed the limitations of evaluation and management by telemedicine and the availability of in person appointments. The patient expressed understanding and agreed to proceed.  History of Present Illness:  [name] [name] is a 49 year old male with a history of COPD, hypertension, vomiting, diarrhea, tobacco abuse, GERD, morbid obesity, Covid-19 infection who presents today with a chief complaint of nausea.   He endorses mild nausea since starting citalopram 10 mg in April 2021, was prescribed Zofran at the time. Over the last two weeks he's been vomiting and dry heaving as he is constantly nauseated. He's not been able to eat much over the last 3-4 days. He has generalized abdominal cramping at times, denies abdominal pain. He also denies diarrhea, constipation.   He has not checked his temperature, he is able to keep liquids down. This morning he ate some bread with marinara sauce about 20 minutes ago, is now starting to feel nauseated.   He's been out of work since June 23rd, would like to return on June 30th. He took his citalopram dose this morning.    Observations/Objective:  Alert and oriented. Appears well, not sickly. No distress. Speaking in complete sentences.   Assessment and Plan:  Suspect nausea is secondary to citalopram as this is a known side effect and also since he really lacks other cardinal symptoms of GI illness. Stop citalopram. Rx for Zofran provided.  Checking labs to rule out infectious cause.  He is able to stay hydrated which is  reassuring.  Work note provided.   Follow Up Instructions:  Come by for labs today as discussed.  You can take the Zofran as needed for nausea and vomiting.  Stop taking citalopram.   It was a pleasure to see you today! [name], NP-C    I discussed the assessment and treatment plan with the patient. The patient was provided an opportunity to ask questions and all were answered. The patient agreed with the plan and demonstrated an understanding of the instructions.   The patient was advised to call back or seek an in-person evaluation if the symptoms worsen or if the condition fails to improve as anticipated.    [name], NP    Review of Systems  Constitutional: Negative for chills and fever.  Gastrointestinal: Positive for nausea and vomiting. Negative for abdominal pain, constipation and diarrhea.  Neurological: Negative for dizziness.       Past Medical History:  Diagnosis Date  . Abdominal pain 07/31/2014  . Abnormal CT scan, small bowel 12/28/2015  . Acute bronchitis 04/09/2013  . Acute conjunctivitis of both eyes 09/04/2017  . Acute pharyngitis 04/13/2013  . ADHD (attention deficit hyperactivity disorder) 10/21/2006   Qualifier: Diagnosis of  By: Maxie Better FNP, Rosalita Levan   . AKI (acute kidney injury) ([place]) 06/26/2017  . Alternating constipation and diarrhea 10/12/2015  . ANGER 10/31/2006   Qualifier: Diagnosis of  By: Maxie Better FNP, Rosalita Levan   . ANXIETY 11/14/2009   Qualifier: Diagnosis of  By: Deborra Medina MD, Tanja Port    .  Asthma   . 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 Bronchitis   . Calculus of gallbladder with acute cholecystitis without obstruction 02/15/2015  . Chest pain 03/17/2018  . Chest wall pain 02/10/2015  . Cholelithiasis 02/13/2015  . Chronic bronchitis ([place])   . Complication of anesthesia    usually wake up in the middle of procedure or surgery  . Conjunctivitis  10/18/2010  . Dehydration 06/26/2017  . DEPENDENT EDEMA 11/09/2008   Qualifier: Diagnosis of  By: Maxie Better FNP, Rosalita Levan   . Diarrhea 11/06/2016  . Enteritis 12/21/2015  . Erectile dysfunction 04/11/2017  . Gallstone 02/10/2015  . Gallstones   . GASTROENTERITIS WITHOUT DEHYDRATION 05/15/2010   Qualifier: Diagnosis of  By: Deborra Medina MD, Tanja Port    . GERD 10/21/2006   Qualifier: Diagnosis of  By: Maxie Better FNP, Rosalita Levan   . GERD (gastroesophageal reflux disease)   . Hepatic steatosis 05/31/2015  . History of colonic polyps 12/28/2015  . Hypertension   . Lumbar back sprain 01/28/2017  . Mole of skin 05/28/2012  . Morbid obesity ([place]) 07/30/2006   Qualifier: Diagnosis of  By: Maxie Better FNP, Rosalita Levan   . Nausea vomiting and diarrhea 07/31/2014  . Obesity   . OSA (obstructive sleep apnea) 07/30/2006   Qualifier: Diagnosis of  By: Maxie Better FNP, Rosalita Levan   . Partial small bowel obstruction ([place]) 12/14/2015  . Right knee pain 12/01/2012  . SBO (small bowel obstruction) ([place]) 06/26/2017  . Skin lesion 11/07/2014  . Skin tag 05/28/2012  . Sleep apnea    "suppose to wear mask but I don't" (05/31/2015)  . Small bowel obstruction ([place]) 05/31/2015  . Small bowel obstruction, partial ([place]) 12/13/2015  . Smoker 09/04/2017  . Viral gastroenteritis 03/23/2016     Social History   Socioeconomic History  . Marital status: Legally Separated    Spouse name: Not on file  . Number of children: 1  . Years of education: Not on file  . Highest education level: Not on file  Occupational History  . Occupation: Publishing rights manager: [employer]  Tobacco Use  . Smoking status: Former Smoker    Packs/day: 1.00    Years: 20.00    Pack years: 20.00    Types: Cigarettes    Start date: 10/03/2016    Quit date: 10/03/2017    Years since quitting: 1.9  . Smokeless tobacco: Current User    Types: Snuff  Vaping Use  . Vaping Use: Never used  Substance and Sexual Activity  . Alcohol use: Yes     Alcohol/week: 13.0 standard drinks    Types: 13 Shots of liquor per week    Comment: "a couple of shots a day"  . Drug use: No  . Sexual activity: Yes  Other Topics Concern  . Not on file  Social History Narrative  . Not on file   Social Determinants of Health   Financial Resource Strain:   . Difficulty of Paying Living Expenses:   Food Insecurity:   . Worried About Charity fundraiser in the Last Year:   . Arboriculturist in the Last Year:   Transportation Needs:   . Film/video editor (Medical):   Marland Kitchen Lack of Transportation (Non-Medical):   Physical Activity:   . Days of Exercise per Week:   . Minutes of Exercise per Session:   Stress:   . Feeling of Stress :  Social Connections:   . Frequency of Communication with Friends and Family:   . Frequency of Social Gatherings with Friends and Family:   . Attends Religious Services:   . Active Member of Clubs or Organizations:   . Attends Archivist Meetings:   Marland Kitchen Marital Status:   Intimate Partner Violence:   . Fear of Current or Ex-Partner:   . Emotionally Abused:   Marland Kitchen Physically Abused:   . Sexually Abused:     Past Surgical History:  Procedure Laterality Date  . CHOLECYSTECTOMY N/A 02/14/2015   Procedure: LAPAROSCOPIC CHOLECYSTECTOMY WITH INTRAOPERATIVE CHOLANGIOGRAM;  Surgeon: Coralie Keens, MD;  Location: [facility];  Service: General;  Laterality: N/A;  . COLONOSCOPY  10/16/2005   Polyps (tubular adenoma 2007), external hemorrhoids  . COLONOSCOPY WITH PROPOFOL N/A 01/05/2016   Procedure: COLONOSCOPY WITH PROPOFOL;  Surgeon: Milus Banister, MD;  Location: [facility];  Service: Endoscopy;  Laterality: N/A;  . FINGER SURGERY Left    reconstructive - 2 surgeries on hand  . LAPAROSCOPIC GASTRIC SLEEVE RESECTION  10/2010    [facility] History  Problem Relation Age of Onset  . Diabetes Mellitus II Mother   . Breast cancer Mother   . Ovarian cancer Mother   . Diabetes Mellitus II Father   . CAD  Father   . Stroke Father   . Cancer Father        ?  . Clotting disorder Father        blood clots in brain with surgery  . Cirrhosis Maternal Grandmother   . Breast cancer Maternal Grandmother     Allergies  Allergen Reactions  . Bee Venom Anaphylaxis  . Codeine Anaphylaxis    Tolerates dilaudid  . Hornet Venom Anaphylaxis  . Penicillins Hives and Other (See Comments)    Has patient had a PCN reaction causing immediate rash, facial/tongue/throat swelling, SOB or lightheadedness with hypotensionYES Has patient had a PCN reaction causing severe rash involving mucus membranes or skin necrosis: NO Has patient had a PCN reaction that required hospitalization NO Has patient had a PCN reaction occurring within the last 10 years: NO If all of the above answers are "NO", then may proceed with Cephalosporin use.    Current Outpatient Medications on File Prior to Visit  Medication Sig Dispense Refill  . albuterol (ACCUNEB) 0.63 MG/3ML nebulizer solution USE 1 VIAL IN NEBULIZER EVERY 6 HOURS AS NEEDED FOR WHEEZING OR  SHORTNESS  OF  BREATH 90 mL 0  . albuterol (PROAIR HFA) 108 (90 Base) MCG/ACT inhaler Inhale 2 puffs into the lungs every 6 (six) hours as needed for wheezing or shortness of breath. 18 g 3  . amLODipine (NORVASC) 10 MG tablet Take 1 tablet by mouth once daily 90 tablet 1  . budesonide-formoterol (SYMBICORT) 160-4.5 MCG/ACT inhaler Inhale 2 puffs into the lungs 2 (two) times daily. 1 Inhaler 0  . cetirizine (ZYRTEC) 10 MG tablet Take 10 mg by mouth daily.    . citalopram (CELEXA) 10 MG tablet Take 1 tablet (10 mg total) by mouth daily. 30 tablet 1  . cyclobenzaprine (FLEXERIL) 10 MG tablet Take 0.5 tablets (5 mg total) by mouth at bedtime as needed for muscle spasms (sedation caution). 20 tablet 0  . omeprazole (PRILOSEC) 20 MG capsule Take 1 capsule by mouth once daily 90 capsule 1  . sildenafil (REVATIO) 20 MG tablet Take 2-5 tablets by mouth 30 minutes prior to intercourse. 50  tablet 0   Current  Facility-Administered Medications on File Prior to Visit  Medication Dose Route Frequency Provider Last Rate Last Admin  . technetium tetrofosmin (TC-MYOVIEW) injection 25.9 millicurie  10.2 millicurie Intravenous Once PRN Nahser, Wonda Cheng, MD        Ht 6\' 2"  (1.88 m)   Wt (!) 376 lb (170.6 kg) Comment: per patient  BMI 48.28 kg/m    Objective:   Physical Exam Constitutional:      Comments: Appears tired  Pulmonary:     Effort: Pulmonary effort is normal.  Neurological:     Mental Status: He is alert and oriented to person, place, and time.  Psychiatric:        Mood and Affect: Mood normal.            Assessment & Plan:

## 2019-08-31 NOTE — Assessment & Plan Note (Signed)
Suspect nausea is secondary to citalopram as this is a known side effect and also since he really lacks other cardinal symptoms of GI illness. Stop citalopram. Rx for Zofran provided.  Checking labs to rule out infectious cause.  He is able to stay hydrated which is reassuring.  Work note provided.

## 2019-08-31 NOTE — Patient Instructions (Signed)
Come by for labs today as discussed.  You can take the Zofran as needed for nausea and vomiting.  Stop taking citalopram.   It was a pleasure to see you today! [name], NP-C

## 2019-09-01 ENCOUNTER — Telehealth: Payer: Self-pay | Admitting: Primary Care

## 2019-09-01 NOTE — Telephone Encounter (Signed)
Spoken to [name] and confirm the work note. Latest one was dated on 08/31/2019

## 2019-09-01 NOTE — Telephone Encounter (Signed)
David with the  [employer] [place] [facility] called today He is needing to verify the work notes that the patient has brought to them. He stated they have received several notes from the patient and wanted to make sure that they are from [place] and that the notes are true.   Call Back # [phone]

## 2019-09-02 NOTE — Telephone Encounter (Signed)
FYI. His job called yesterday due to the numerous amount of work note from the office.

## 2019-09-12 ENCOUNTER — Emergency Department (HOSPITAL_COMMUNITY)
Admission: EM | Admit: 2019-09-12 | Discharge: 2019-09-12 | Disposition: A | Discharge status: Home / Self Care | Payer: BC Managed Care – PPO | Attending: Emergency Medicine | Admitting: Emergency Medicine

## 2019-09-12 ENCOUNTER — Other Ambulatory Visit: Payer: Self-pay

## 2019-09-12 ENCOUNTER — Encounter (HOSPITAL_COMMUNITY): Payer: Self-pay | Admitting: Emergency Medicine

## 2019-09-12 ENCOUNTER — Emergency Department (HOSPITAL_COMMUNITY): Payer: BC Managed Care – PPO

## 2019-09-12 DIAGNOSIS — N281 Cyst of kidney, acquired: Secondary | ICD-10-CM | POA: Insufficient documentation

## 2019-09-12 DIAGNOSIS — Z79899 Other long term (current) drug therapy: Secondary | ICD-10-CM | POA: Insufficient documentation

## 2019-09-12 DIAGNOSIS — F17220 Nicotine dependence, chewing tobacco, uncomplicated: Secondary | ICD-10-CM | POA: Diagnosis not present

## 2019-09-12 DIAGNOSIS — R112 Nausea with vomiting, unspecified: Secondary | ICD-10-CM

## 2019-09-12 DIAGNOSIS — J441 Chronic obstructive pulmonary disease with (acute) exacerbation: Secondary | ICD-10-CM | POA: Diagnosis not present

## 2019-09-12 DIAGNOSIS — Q6102 Congenital multiple renal cysts: Secondary | ICD-10-CM

## 2019-09-12 DIAGNOSIS — I10 Essential (primary) hypertension: Secondary | ICD-10-CM | POA: Diagnosis not present

## 2019-09-12 DIAGNOSIS — F1090 Alcohol use, unspecified, uncomplicated: Secondary | ICD-10-CM

## 2019-09-12 DIAGNOSIS — R1084 Generalized abdominal pain: Secondary | ICD-10-CM | POA: Diagnosis present

## 2019-09-12 LAB — URINALYSIS, ROUTINE W REFLEX MICROSCOPIC
Bilirubin Urine: NEGATIVE
Glucose, UA: NEGATIVE mg/dL
Hgb urine dipstick: NEGATIVE
Ketones, ur: NEGATIVE mg/dL
Leukocytes,Ua: NEGATIVE
Nitrite: NEGATIVE
Protein, ur: NEGATIVE mg/dL
Specific Gravity, Urine: 1.015 (ref 1.005–1.030)
pH: 5 (ref 5.0–8.0)

## 2019-09-12 LAB — COMPREHENSIVE METABOLIC PANEL
ALT: 49 U/L — ABNORMAL HIGH (ref 0–44)
AST: 35 U/L (ref 15–41)
Albumin: 4 g/dL (ref 3.5–5.0)
Alkaline Phosphatase: 53 U/L (ref 38–126)
Anion gap: 10 (ref 5–15)
BUN: 6 mg/dL (ref 6–20)
CO2: 24 mmol/L (ref 22–32)
Calcium: 8.8 mg/dL — ABNORMAL LOW (ref 8.9–10.3)
Chloride: 102 mmol/L (ref 98–111)
Creatinine, Ser: 1.03 mg/dL (ref 0.61–1.24)
GFR calc Af Amer: >60 mL/min (ref >60)
GFR calc non Af Amer: >60 mL/min (ref >60)
Glucose, Bld: 93 mg/dL (ref 70–99)
Potassium: 3.7 mmol/L (ref 3.5–5.1)
Sodium: 136 mmol/L (ref 135–145)
Total Bilirubin: 0.6 mg/dL (ref 0.3–1.2)
Total Protein: 6.7 g/dL (ref 6.5–8.1)

## 2019-09-12 LAB — CBC
HCT: 50.3 % (ref 39.0–52.0)
Hemoglobin: 17.6 g/dL — ABNORMAL HIGH (ref 13.0–17.0)
MCH: 32.4 pg (ref 26.0–34.0)
MCHC: 35 g/dL (ref 30.0–36.0)
MCV: 92.6 fL (ref 80.0–100.0)
Platelets: 196 10*3/uL (ref 150–400)
RBC: 5.43 MIL/uL (ref 4.22–5.81)
RDW: 12.7 % (ref 11.5–15.5)
WBC: 7.5 10*3/uL (ref 4.0–10.5)
nRBC: 0 % (ref 0.0–0.2)

## 2019-09-12 LAB — LIPASE, BLOOD: Lipase: 30 U/L (ref 11–51)

## 2019-09-12 MED ORDER — IOHEXOL 300 MG/ML  SOLN
100.0000 mL | Freq: Once | INTRAMUSCULAR | Status: AC | PRN
  Administered 2019-09-12: 100 mL via INTRAVENOUS

## 2019-09-12 MED ORDER — METOCLOPRAMIDE HCL 5 MG/ML IJ SOLN
10.0000 mg | Freq: Once | INTRAMUSCULAR | Status: AC
  Administered 2019-09-12: 10 mg via INTRAVENOUS
  Filled 2019-09-12: qty 2

## 2019-09-12 MED ORDER — FAMOTIDINE IN NACL 20-0.9 MG/50ML-% IV SOLN
20.0000 mg | Freq: Once | INTRAVENOUS | Status: AC
  Administered 2019-09-12: 20 mg via INTRAVENOUS
  Filled 2019-09-12: qty 50

## 2019-09-12 MED ORDER — SODIUM CHLORIDE 0.9% FLUSH
3.0000 mL | Freq: Once | INTRAVENOUS | Status: AC
  Administered 2019-09-12: 3 mL via INTRAVENOUS

## 2019-09-12 MED ORDER — OMEPRAZOLE 40 MG PO CPDR
40.0000 mg | DELAYED_RELEASE_CAPSULE | Freq: Every day | ORAL | 0 refills | Status: DC
Start: 2019-09-12 — End: 2020-01-07

## 2019-09-12 MED ORDER — SODIUM CHLORIDE 0.9 % IV SOLN: Freq: Once | INTRAVENOUS | Status: DC

## 2019-09-12 MED ORDER — SUCRALFATE 1 G PO TABS: 1.0000 g | ORAL_TABLET | Freq: Three times a day (TID) | ORAL | 0 refills | Status: DC

## 2019-09-12 NOTE — ED Notes (Signed)
AVS reviewed with pt. PT ambulatory out of dept.

## 2019-09-12 NOTE — ED Provider Notes (Signed)
[facility] EMERGENCY DEPARTMENT Provider Note   CSN: [id number] Arrival date & time: 09/12/19  2263     History Chief Complaint  Patient presents with  . Abdominal Pain    [name] [name] is a 49 y.o. male with pertinent past medical history of gastric bypass surgery, cholecystectomy, partial SBO presents to the ED for nausea associated with vomiting and abdominal pain for the last 3 to 4 weeks.  States the cough nausea has been constant but it worsens 20 to 40 minutes after eating.  Has had minimal vomiting, mostly dry heaving after meals.  His abdominal pain is "all over", states occasionally worse in the lower abdomen.  Abdominal pain does worsen after meals.  His bowel movements are sometimes loose, sometimes he is constipated, sometimes his stools are very small.  He had several bowel movements yesterday, is passing gas normally.  4 to 5 days ago he noticed some very dark stools, describes it as the color of Oreos.  States in the last 3 to 4 weeks and symptoms began he has lost approximately 20 pounds, from 396 to 373 due to decreased eating.  Around the time where in his symptoms started he had just been put on citalopram for depression.  His PCP told him the nausea was likely a side effect from this medicine.  He stopped taking this medicine 1.5 weeks ago but states the nausea, vomiting and abdominal pain continue.  He takes Zofran which gives him temporary relief of his nausea but it never last 8 hours and the nausea returned.  Does not take ibuprofen or aspirin products.  No recent endoscopies.  Does not have a GI doctor.  Admits ever since losing her mother a year ago he drinks a 12 pack of beer and 7-8 shots of fireball every a couple of days.  Denies any withdrawal issues in between his binge drinking.  No fever.  Fully vaccinated for COVID-19.  Takes 20 mg omeprazole with foods.   HPI     Past Medical History:  Diagnosis Date  . Abdominal pain 07/31/2014  .  Abnormal CT scan, small bowel 12/28/2015  . Acute bronchitis 04/09/2013  . Acute conjunctivitis of both eyes 09/04/2017  . Acute pharyngitis 04/13/2013  . ADHD (attention deficit hyperactivity disorder) 10/21/2006   Qualifier: Diagnosis of  By: Maxie Better FNP, Rosalita Levan   . AKI (acute kidney injury) ([place]) 06/26/2017  . Alternating constipation and diarrhea 10/12/2015  . ANGER 10/31/2006   Qualifier: Diagnosis of  By: Maxie Better FNP, Rosalita Levan   . ANXIETY 11/14/2009   Qualifier: Diagnosis of  By: Deborra Medina MD, Tanja Port    . Asthma   . 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 Bronchitis   . Calculus of gallbladder with acute cholecystitis without obstruction 02/15/2015  . Chest pain 03/17/2018  . Chest wall pain 02/10/2015  . Cholelithiasis 02/13/2015  . Chronic bronchitis ([place])   . Complication of anesthesia    usually wake up in the middle of procedure or surgery  . Conjunctivitis 10/18/2010  . Dehydration 06/26/2017  . DEPENDENT EDEMA 11/09/2008   Qualifier: Diagnosis of  By: Maxie Better FNP, Rosalita Levan   . Diarrhea 11/06/2016  . Enteritis 12/21/2015  . Erectile dysfunction 04/11/2017  . Gallstone 02/10/2015  . Gallstones   . GASTROENTERITIS WITHOUT DEHYDRATION 05/15/2010   Qualifier: Diagnosis of  By: Deborra Medina MD, Tanja Port    . GERD 10/21/2006  Qualifier: Diagnosis of  By: Maxie Better FNP, Rosalita Levan   . GERD (gastroesophageal reflux disease)   . Hepatic steatosis 05/31/2015  . History of colonic polyps 12/28/2015  . Hypertension   . Lumbar back sprain 01/28/2017  . Mole of skin 05/28/2012  . Morbid obesity ([place]) 07/30/2006   Qualifier: Diagnosis of  By: Maxie Better FNP, Rosalita Levan   . Nausea vomiting and diarrhea 07/31/2014  . Obesity   . OSA (obstructive sleep apnea) 07/30/2006   Qualifier: Diagnosis of  By: Maxie Better FNP, Rosalita Levan   . Partial small bowel obstruction ([place]) 12/14/2015  . Right knee pain 12/01/2012  .  SBO (small bowel obstruction) ([place]) 06/26/2017  . Skin lesion 11/07/2014  . Skin tag 05/28/2012  . Sleep apnea    "suppose to wear mask but I don't" (05/31/2015)  . Small bowel obstruction ([place]) 05/31/2015  . Small bowel obstruction, partial ([place]) 12/13/2015  . Smoker 09/04/2017  . Viral gastroenteritis 03/23/2016    Patient Active Problem List   Diagnosis Date Noted  . Nausea and vomiting 07/03/2019  . MDD (major depressive disorder), single episode, moderate ([place]) 06/25/2019  . COVID-19 virus infection 03/27/2019  . Cough 05/29/2018  . Osteoarthritis 04/03/2018  . Preventative health care 04/03/2018  . Chest pain 03/17/2018  . Smoker 09/04/2017  . COPD with acute exacerbation ([place]) 06/26/2017  . Erectile dysfunction 04/11/2017  . Diarrhea 11/06/2016  . History of colonic polyps 12/28/2015  . Hepatic steatosis 05/31/2015  . Essential hypertension 07/31/2014  . Chronic knee pain 12/01/2012  . Anxiety 11/14/2009  . DEPENDENT EDEMA 11/09/2008  . 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  . ADHD (attention deficit hyperactivity disorder) 10/21/2006  . GERD 10/21/2006  . Morbid obesity ([place]) 07/30/2006  . OSA (obstructive sleep apnea) 07/30/2006  . Hemorrhoids 10/03/2005    Past Surgical History:  Procedure Laterality Date  . CHOLECYSTECTOMY N/A 02/14/2015   Procedure: LAPAROSCOPIC CHOLECYSTECTOMY WITH INTRAOPERATIVE CHOLANGIOGRAM;  Surgeon: Coralie Keens, MD;  Location: [place];  Service: General;  Laterality: N/A;  . COLONOSCOPY  10/16/2005   Polyps (tubular adenoma 2007), external hemorrhoids  . COLONOSCOPY WITH PROPOFOL N/A 01/05/2016   Procedure: COLONOSCOPY WITH PROPOFOL;  Surgeon: Milus Banister, MD;  Location: [facility];  Service: Endoscopy;  Laterality: N/A;  . FINGER SURGERY Left    reconstructive - 2 surgeries on hand  . LAPAROSCOPIC GASTRIC SLEEVE RESECTION  10/2010    at [facility] History  Problem Relation Age of Onset  . Diabetes Mellitus II  Mother   . Breast cancer Mother   . Ovarian cancer Mother   . Diabetes Mellitus II Father   . CAD Father   . Stroke Father   . Cancer Father        ?  . Clotting disorder Father        blood clots in brain with surgery  . Cirrhosis Maternal Grandmother   . Breast cancer Maternal Grandmother     Social History   Tobacco Use  . Smoking status: Former Smoker    Packs/day: 1.00    Years: 20.00    Pack years: 20.00    Types: Cigarettes    Start date: 10/03/2016    Quit date: 10/03/2017    Years since quitting: 1.9  . Smokeless tobacco: Current User    Types: Snuff  Vaping Use  . Vaping Use: Never used  Substance Use Topics  . Alcohol use: Yes    Alcohol/week: 13.0 standard  drinks    Types: 13 Shots of liquor per week    Comment: "a couple of shots a day"  . Drug use: No    Home Medications Prior to Admission medications   Medication Sig Start Date End Date Taking? Authorizing Provider  albuterol (ACCUNEB) 0.63 MG/3ML nebulizer solution USE 1 VIAL IN NEBULIZER EVERY 6 HOURS AS NEEDED FOR WHEEZING OR  SHORTNESS  OF  BREATH 07/02/19  Yes [name], NP  albuterol (PROAIR HFA) 108 (90 Base) MCG/ACT inhaler Inhale 2 puffs into the lungs every 6 (six) hours as needed for wheezing or shortness of breath. 03/02/19  Yes Ria Bush, MD  amLODipine (NORVASC) 10 MG tablet Take 1 tablet by mouth once daily 07/29/19  Yes [name], NP  budesonide-formoterol [facility]) 160-4.5 MCG/ACT inhaler Inhale 2 puffs into the lungs 2 (two) times daily. 08/06/19  Yes [name], NP  cetirizine (ZYRTEC) 10 MG tablet Take 10 mg by mouth daily.   Yes [provider]  cyclobenzaprine (FLEXERIL) 10 MG tablet Take 0.5 tablets (5 mg total) by mouth at bedtime as needed for muscle spasms (sedation caution). 02/03/19  Yes Tonia Ghent, MD  ondansetron (ZOFRAN ODT) 4 MG disintegrating tablet Take 1 tablet (4 mg total) by mouth every 8 (eight) hours as needed for nausea or  vomiting. 08/31/19  Yes [name], NP  sildenafil (REVATIO) 20 MG tablet Take 2-5 tablets by mouth 30 minutes prior to intercourse. 04/11/17  Yes [name], NP  omeprazole (PRILOSEC) 40 MG capsule Take 1 capsule (40 mg total) by mouth daily. 09/12/19 10/12/19  Kinnie Feil, PA-C  sucralfate (CARAFATE) 1 g tablet Take 1 tablet (1 g total) by mouth 4 (four) times daily -  with meals and at bedtime. 09/12/19 10/12/19  Kinnie Feil, PA-C    Allergies    Bee venom, Codeine, Hornet venom, and Penicillins  Review of Systems   Review of Systems  Gastrointestinal: Positive for abdominal pain, blood in stool, nausea and vomiting.  All other systems reviewed and are negative.   Physical Exam Updated Vital Signs BP (!) 147/109   Pulse 89   Temp 99 F (37.2 C) (Oral)   Resp 18   Ht 6\' 2"  (1.88 m)   Wt (!) 185 kg   SpO2 98%   BMI 52.37 kg/m   Physical Exam Vitals and nursing note reviewed.  Constitutional:      Appearance: He is well-developed.     Comments: Non toxic.  Obese male.  HENT:     Head: Normocephalic and atraumatic.     Nose: Nose normal.     Mouth/Throat:     Comments: Moist mucous membranes Eyes:     Conjunctiva/sclera: Conjunctivae normal.  Cardiovascular:     Rate and Rhythm: Normal rate and regular rhythm.     Heart sounds: Normal heart sounds.  Pulmonary:     Effort: Pulmonary effort is normal.     Breath sounds: Normal breath sounds.  Abdominal:     General: Bowel sounds are normal.     Palpations: Abdomen is soft.     Tenderness: There is abdominal tenderness (Diffuse lower abdominal and periumbilical tenderness.) in the right lower quadrant, periumbilical area, suprapubic area and left lower quadrant.     Comments: No G/R/R. No suprapubic or CVA tenderness. Negative Murphy's and McBurney's  Musculoskeletal:        General: Normal range of motion.     Cervical back: Normal  range of motion.  Skin:    General: Skin is warm and dry.      Capillary Refill: Capillary refill takes less than 2 seconds.  Neurological:     Mental Status: He is alert.  Psychiatric:        Behavior: Behavior normal.     ED Results / Procedures / Treatments   Labs (all labs ordered are listed, but only abnormal results are displayed) Labs Reviewed  COMPREHENSIVE METABOLIC PANEL - Abnormal; Notable for the following components:      Result Value   Calcium 8.8 (*)    ALT 49 (*)    All other components within normal limits  CBC - Abnormal; Notable for the following components:   Hemoglobin 17.6 (*)    All other components within normal limits  LIPASE, BLOOD  URINALYSIS, ROUTINE W REFLEX MICROSCOPIC    EKG None  Radiology No results found.  Procedures Procedures (including critical care time)  Medications Ordered in ED Medications  0.9 %  sodium chloride infusion (has no administration in time range)  sodium chloride flush (NS) 0.9 % injection 3 mL (3 mLs Intravenous Given 09/12/19 0815)  famotidine (PEPCID) IVPB 20 mg premix (0 mg Intravenous Stopped 09/12/19 0851)  metoCLOPramide (REGLAN) injection 10 mg (10 mg Intravenous Given 09/12/19 0815)  iohexol (OMNIPAQUE) 300 MG/ML solution 100 mL (100 mLs Intravenous Contrast Given 09/12/19 0932)    ED Course  I have reviewed the triage vital signs and the nursing notes.  Pertinent labs & imaging results that were available during my care of the patient were reviewed by me and considered in my medical decision making (see chart for details).  Clinical Course as of Sep 12 1315  Sat Sep 12, 2019  0703 Hemoglobin(!): 17.6 [CG]  0738 Omeprazole daily takes thinks 20 mg with breakfast    [CG]    Clinical Course User Index [CG] Arlean Hopping   MDM Rules/Calculators/A&P                           49 year old male with history of obesity status post gastric bypass, cholecystectomy presents for 4 weeks of nausea, vomiting and abdominal pain that is significantly worse after  eating.  Admits to binge drinking every 2 to 3 days.  I reviewed patient's available medical records, triage nursing notes to assist with history and assist with MDM.  In summary, patient seen by PCP recently for the symptoms.  PCP suspected side effect of citalopram.  However, patient has stopped this medicine and continued to have symptoms.  Chief complain involves an extensive number of treatment options and is a complaint that carries with it a high risk of complications and morbidity and mortality  Highest on differential diagnosis is gastritis, uncontrolled GERD, alcoholic pancreatitis.  He reports some loose and dark stools 4 to 5 days ago.  Possible ulcer.  Less likely diverticulitis, SBO, gastric sleeve complication.  He has no urinary symptoms.  ER work-up including lab work, urinalysis initiated in triage.  These were personally visualized and interpreted.  ER work-up so far benign.  Normal LFTs, lipase, creatinine.  UA without infection, bilirubin or RBCs.  Given surgical history, history of partial SBO and worsening symptoms we will obtain a CT.  I have ordered Reglan, Pepcid, n.p.o.  Will reassess.  1316: CT personally visualized, shows stable right kidney cysts, hepatic steatosis and otherwise non acute. Pt re-evaluated and no clinical decline, no vomiting. Discussed  findings with patient. Discussed strong suspicion for uncontrolled GERD, gastritis or ulcer given ETOH use. Will dc with omeprazole, carafate, zofran. Encouraged alcohol cessation, GI follow up. Return precautions given. He is in agreement.  Final Clinical Impression(s) / ED Diagnoses Final diagnoses:  Nausea and vomiting in adult  Multiple renal cysts  Heavy alcohol use    Rx / DC Orders ED Discharge Orders         Ordered    omeprazole (PRILOSEC) 40 MG capsule  Daily     Discontinue  Reprint     09/12/19 1235    sucralfate (CARAFATE) 1 g tablet  3 times daily with meals & bedtime     Discontinue  Reprint      09/12/19 1235           Arlean Hopping 09/12/19 1317    Dorie Rank, MD 09/12/19 1718

## 2019-09-12 NOTE — Discharge Instructions (Addendum)
You were seen in the ER for abdominal pain. Labs were normal. CT showed stable right kidney cysts. No other findings to explain your symptoms.    I suspect your symptoms may be from gastritis, acid reflux or possibly ulcer.  All of these are managed similarly.   We will treat this with anti-acid medicines.  Start taking over the counter omeprazole to 40 mg daily, take on an empty stomach first thing in the morning and wait 20-30 min before eating.   Use sucralfate solution or tabs 20 min before meals and at bed time.   Avoid irritating foods and liquids such as alcohol, greasy/fatty or acidic foods. Avoid ibuprofen containing products.   Follow up with primary care doctor for further management of this if it persits.   Return to the ER for fever, chills, blood in vomit or stool, worsening localized abdominal pain to right upper or right lower abdomen, inability to tolerate fluids.

## 2019-09-12 NOTE — ED Triage Notes (Signed)
Patient reports pain across his abdomen for several weeks with emesis and diarrhea , denies fever or chills .

## 2019-09-17 ENCOUNTER — Other Ambulatory Visit: Payer: Self-pay

## 2019-09-17 ENCOUNTER — Encounter: Payer: Self-pay | Admitting: Primary Care

## 2019-09-17 ENCOUNTER — Telehealth (INDEPENDENT_AMBULATORY_CARE_PROVIDER_SITE_OTHER): Payer: BC Managed Care – PPO | Admitting: Primary Care

## 2019-09-17 DIAGNOSIS — F1010 Alcohol abuse, uncomplicated: Secondary | ICD-10-CM | POA: Diagnosis not present

## 2019-09-17 DIAGNOSIS — K219 Gastro-esophageal reflux disease without esophagitis: Secondary | ICD-10-CM | POA: Diagnosis not present

## 2019-09-17 DIAGNOSIS — F321 Major depressive disorder, single episode, moderate: Secondary | ICD-10-CM

## 2019-09-17 DIAGNOSIS — F1011 Alcohol abuse, in remission: Secondary | ICD-10-CM | POA: Insufficient documentation

## 2019-09-17 NOTE — Patient Instructions (Signed)
Continue omeprazole 40 mg daily until the prescription is out. Reduce down to omeprazole 20 mg thereafter.  Continue Carafate for another 1-2 weeks as needed.  Please update me in a few weeks.  It was a pleasure to see you today! [name], NP-C

## 2019-09-17 NOTE — Assessment & Plan Note (Signed)
Heavy drinker with 12 pack of beer and 7-8 shots of fireball 2-3 times weekly. No alcohol in four days given effects of gastritis.   Encouraged continued cessation.  We will need to monitor.

## 2019-09-17 NOTE — Assessment & Plan Note (Signed)
Continue off of citalopram for now, he will update in a few weeks after GI symptoms resolve. Consider resuming at that point.

## 2019-09-17 NOTE — Assessment & Plan Note (Signed)
Suspect gastritis/GERD due to heavy alcohol consumption for which he never disclosed during our prior visits. Strongly advised he remain abstinent from alcohol. Continue omeprazole 40 mg daily x 30 days, then reduce down to 20 mg. Continue Carafate for another 1-2 weeks.  Work note provided.  He will update in a few weeks.

## 2019-09-17 NOTE — Progress Notes (Signed)
Subjective:    Patient ID: [name] [name], male    DOB: 14-Dec-1970, 49 y.o.   MRN: [id number]  HPI  Virtual Visit via Video Note  I connected with [name] [name] on 09/17/19 at 10:20 AM EDT by a video enabled telemedicine application and verified that I am speaking with the correct person using two identifiers.  Location: Patient: Home Provider: Office Participants: Patient and myself   I discussed the limitations of evaluation and management by telemedicine and the availability of in person appointments. The patient expressed understanding and agreed to proceed.  History of Present Illness:  [name] [name] is a 49 year old male with a history of OSA, COPD, hypertension, morbid obesity, diarrhea, nausea and vomiting, gastric bypass, tobacco abuse who presents today for emergency department follow up.  He presented to [facility] on 09/12/19 with a chief complaint of abdominal pain, nausea and vomiting. Symptoms began 3-4 weeks prior, abdominal pain is generalized but mostly to the lower abdomen. Normal bowel movements and passing gas, but darker colored stools over the last 3-4 days. He admitted to drinking 12 pack of beer and 7-8 shots of fireball liquor every several days.   During his stay in the ED he underwent labs which were grossly unremarkable. UA was negative for infection. He underwent CT abdomen/pelvis which showed stable right renal cysts, fatty liver, otherwise no acute process. He was trated with Reglan, Pepcid. Given his grossly unremarkable work up his symptoms were suspected to be secondary to GERD, gastritis, or ETOH induced ulcer. He was discharged home with prescriptions for omeprazole, Carafate, Zofran and was encouraged to stop drinking alcohol.  Since his ED visit he's feeling a lot better. He's not had any alcohol in four days. He's compliant to omeprazole 40 mg daily and Carafate several times daily. He's sleeping well, no vomiting or diarrhea. He's needing excuse from  work starting July 6th through July 15th, he plans on returning to work on July 16th.   Observations/Objective:  Alert and oriented. Appears well, not sickly. No distress. Speaking in complete sentences.   Assessment and Plan:  Suspect gastritis/GERD due to heavy alcohol consumption for which he never disclosed during our prior visits. Strongly advised he remain abstinent from alcohol. Continue omeprazole 40 mg daily x 30 days, then reduce down to 20 mg. Continue Carafate for another 1-2 weeks.  Work note provided.  He will update in a few weeks.  Follow Up Instructions:  Continue omeprazole 40 mg daily until the prescription is out. Reduce down to omeprazole 20 mg thereafter.  Continue Carafate for another 1-2 weeks as needed.  Please update me in a few weeks.  It was a pleasure to see you today! [name], NP-C    I discussed the assessment and treatment plan with the patient. The patient was provided an opportunity to ask questions and all were answered. The patient agreed with the plan and demonstrated an understanding of the instructions.   The patient was advised to call back or seek an in-person evaluation if the symptoms worsen or if the condition fails to improve as anticipated.    [name] [name], NP    Review of Systems  Constitutional: Negative for fever.  Respiratory: Negative for shortness of breath.   Gastrointestinal: Negative for abdominal pain, blood in stool, constipation, diarrhea, nausea and vomiting.       Past Medical History:  Diagnosis Date  . Abdominal pain 07/31/2014  . Abnormal CT scan, small bowel 12/28/2015  . Acute  bronchitis 04/09/2013  . Acute conjunctivitis of both eyes 09/04/2017  . Acute pharyngitis 04/13/2013  . ADHD (attention deficit hyperactivity disorder) 10/21/2006   Qualifier: Diagnosis of  By: Maxie Better FNP, Rosalita Levan   . AKI (acute kidney injury) ([place]) 06/26/2017  . Alternating constipation and diarrhea 10/12/2015    . ANGER 10/31/2006   Qualifier: Diagnosis of  By: Maxie Better FNP, Rosalita Levan   . ANXIETY 11/14/2009   Qualifier: Diagnosis of  By: Deborra Medina MD, Tanja Port    . Asthma   . 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 Bronchitis   . Calculus of gallbladder with acute cholecystitis without obstruction 02/15/2015  . Chest pain 03/17/2018  . Chest wall pain 02/10/2015  . Cholelithiasis 02/13/2015  . Chronic bronchitis ([place])   . Complication of anesthesia    usually wake up in the middle of procedure or surgery  . Conjunctivitis 10/18/2010  . Dehydration 06/26/2017  . DEPENDENT EDEMA 11/09/2008   Qualifier: Diagnosis of  By: Maxie Better FNP, Rosalita Levan   . Diarrhea 11/06/2016  . Enteritis 12/21/2015  . Erectile dysfunction 04/11/2017  . Gallstone 02/10/2015  . Gallstones   . GASTROENTERITIS WITHOUT DEHYDRATION 05/15/2010   Qualifier: Diagnosis of  By: Deborra Medina MD, Tanja Port    . GERD 10/21/2006   Qualifier: Diagnosis of  By: Maxie Better FNP, Rosalita Levan   . GERD (gastroesophageal reflux disease)   . Hepatic steatosis 05/31/2015  . History of colonic polyps 12/28/2015  . Hypertension   . Lumbar back sprain 01/28/2017  . Mole of skin 05/28/2012  . Morbid obesity ([place]) 07/30/2006   Qualifier: Diagnosis of  By: Maxie Better FNP, Rosalita Levan   . Nausea vomiting and diarrhea 07/31/2014  . Obesity   . OSA (obstructive sleep apnea) 07/30/2006   Qualifier: Diagnosis of  By: Maxie Better FNP, Rosalita Levan   . Partial small bowel obstruction ([place]) 12/14/2015  . Right knee pain 12/01/2012  . SBO (small bowel obstruction) ([place]) 06/26/2017  . Skin lesion 11/07/2014  . Skin tag 05/28/2012  . Sleep apnea    "suppose to wear mask but I don't" (05/31/2015)  . Small bowel obstruction ([place]) 05/31/2015  . Small bowel obstruction, partial ([place]) 12/13/2015  . Smoker 09/04/2017  . Viral gastroenteritis 03/23/2016     Social History   Socioeconomic History  .  Marital status: Legally Separated    Spouse name: Not on file  . Number of children: 1  . Years of education: Not on file  . Highest education level: Not on file  Occupational History  . Occupation: Publishing rights manager: [employer]  Tobacco Use  . Smoking status: Former Smoker    Packs/day: 1.00    Years: 20.00    Pack years: 20.00    Types: Cigarettes    Start date: 10/03/2016    Quit date: 10/03/2017    Years since quitting: 1.9  . Smokeless tobacco: Current User    Types: Snuff  Vaping Use  . Vaping Use: Never used  Substance and Sexual Activity  . Alcohol use: Yes    Alcohol/week: 13.0 standard drinks    Types: 13 Shots of liquor per week    Comment: "a couple of shots a day"  . Drug use: No  . Sexual activity: Yes  Other Topics Concern  . Not on file  Social History Narrative  . Not on file   Social Determinants of Health   Financial  Resource Strain:   . Difficulty of Paying Living Expenses:   Food Insecurity:   . Worried About Charity fundraiser in the Last Year:   . Arboriculturist in the Last Year:   Transportation Needs:   . Film/video editor (Medical):   Marland Kitchen Lack of Transportation (Non-Medical):   Physical Activity:   . Days of Exercise per Week:   . Minutes of Exercise per Session:   Stress:   . Feeling of Stress :   Social Connections:   . Frequency of Communication with Friends and Family:   . Frequency of Social Gatherings with Friends and Family:   . Attends Religious Services:   . Active Member of Clubs or Organizations:   . Attends Archivist Meetings:   Marland Kitchen Marital Status:   Intimate Partner Violence:   . Fear of Current or Ex-Partner:   . Emotionally Abused:   Marland Kitchen Physically Abused:   . Sexually Abused:     Past Surgical History:  Procedure Laterality Date  . CHOLECYSTECTOMY N/A 02/14/2015   Procedure: LAPAROSCOPIC CHOLECYSTECTOMY WITH INTRAOPERATIVE CHOLANGIOGRAM;  Surgeon: Coralie Keens, MD;  Location: [facility];  Service: General;  Laterality: N/A;  . COLONOSCOPY  10/16/2005   Polyps (tubular adenoma 2007), external hemorrhoids  . COLONOSCOPY WITH PROPOFOL N/A 01/05/2016   Procedure: COLONOSCOPY WITH PROPOFOL;  Surgeon: Milus Banister, MD;  Location: [facility];  Service: Endoscopy;  Laterality: N/A;  . FINGER SURGERY Left    reconstructive - 2 surgeries on hand  . LAPAROSCOPIC GASTRIC SLEEVE RESECTION  10/2010    at [facility] History  Problem Relation Age of Onset  . Diabetes Mellitus II Mother   . Breast cancer Mother   . Ovarian cancer Mother   . Diabetes Mellitus II Father   . CAD Father   . Stroke Father   . Cancer Father        ?  . Clotting disorder Father        blood clots in brain with surgery  . Cirrhosis Maternal Grandmother   . Breast cancer Maternal Grandmother     Allergies  Allergen Reactions  . Bee Venom Anaphylaxis  . Codeine Anaphylaxis    Tolerates dilaudid  . Hornet Venom Anaphylaxis  . Penicillins Hives and Other (See Comments)    Has patient had a PCN reaction causing immediate rash, facial/tongue/throat swelling, SOB or lightheadedness with hypotensionYES Has patient had a PCN reaction causing severe rash involving mucus membranes or skin necrosis: NO Has patient had a PCN reaction that required hospitalization NO Has patient had a PCN reaction occurring within the last 10 years: NO If all of the above answers are "NO", then may proceed with Cephalosporin use.    Current Outpatient Medications on File Prior to Visit  Medication Sig Dispense Refill  . albuterol (ACCUNEB) 0.63 MG/3ML nebulizer solution USE 1 VIAL IN NEBULIZER EVERY 6 HOURS AS NEEDED FOR WHEEZING OR  SHORTNESS  OF  BREATH 90 mL 0  . albuterol (PROAIR HFA) 108 (90 Base) MCG/ACT inhaler Inhale 2 puffs into the lungs every 6 (six) hours as needed for wheezing or shortness of breath. 18 g 3  . amLODipine (NORVASC) 10 MG tablet Take 1 tablet by mouth once daily 90 tablet 1  .  budesonide-formoterol (SYMBICORT) 160-4.5 MCG/ACT inhaler Inhale 2 puffs into the lungs 2 (two) times daily. 1 Inhaler 0  . cetirizine (ZYRTEC) 10 MG tablet Take 10 mg by mouth daily.    Marland Kitchen  cyclobenzaprine (FLEXERIL) 10 MG tablet Take 0.5 tablets (5 mg total) by mouth at bedtime as needed for muscle spasms (sedation caution). 20 tablet 0  . omeprazole (PRILOSEC) 40 MG capsule Take 1 capsule (40 mg total) by mouth daily. 30 capsule 0  . ondansetron (ZOFRAN ODT) 4 MG disintegrating tablet Take 1 tablet (4 mg total) by mouth every 8 (eight) hours as needed for nausea or vomiting. 15 tablet 0  . sildenafil (REVATIO) 20 MG tablet Take 2-5 tablets by mouth 30 minutes prior to intercourse. 50 tablet 0  . sucralfate (CARAFATE) 1 g tablet Take 1 tablet (1 g total) by mouth 4 (four) times daily -  with meals and at bedtime. 120 tablet 0   Current Facility-Administered Medications on File Prior to Visit  Medication Dose Route Frequency Provider Last Rate Last Admin  . technetium tetrofosmin (TC-MYOVIEW) injection 76.8 millicurie  08.8 millicurie Intravenous Once PRN Nahser, Wonda Cheng, MD        There were no vitals taken for this visit.   Objective:   Physical Exam Constitutional:      Appearance: Normal appearance. He is not ill-appearing.  Pulmonary:     Effort: Pulmonary effort is normal.  Neurological:     Mental Status: He is alert and oriented to person, place, and time.  Psychiatric:        Mood and Affect: Mood normal.            Assessment & Plan:

## 2019-09-24 ENCOUNTER — Telehealth: Payer: Self-pay | Admitting: Primary Care

## 2019-09-24 NOTE — Telephone Encounter (Signed)
Patient called office stating his job needs [name] [name] to fill-out Correct Loss Job function form because they did not accept the first work note. He has faxed over the form and needs you to fill out the form and get that faxed back to him at [phone] ASAP.

## 2019-09-24 NOTE — Telephone Encounter (Signed)
As of right now I do not see form on fax or in my inbox. Will check back later.

## 2019-09-25 NOTE — Telephone Encounter (Signed)
Completed and handed to [place] for faxing.

## 2019-09-25 NOTE — Telephone Encounter (Signed)
Patient called back. Patient said he needs form faxed back today. Form put on Kate's desk.  Patient said he can be notified through my chart when paperwork is complete.  Patient can't go back to work until they receive the paperwork.

## 2019-10-12 ENCOUNTER — Emergency Department (HOSPITAL_COMMUNITY): Payer: BC Managed Care – PPO

## 2019-10-12 ENCOUNTER — Encounter (HOSPITAL_COMMUNITY): Payer: Self-pay | Admitting: Emergency Medicine

## 2019-10-12 ENCOUNTER — Inpatient Hospital Stay (HOSPITAL_COMMUNITY)
Admission: EM | Admit: 2019-10-12 | Discharge: 2019-10-14 | DRG: 191 | Disposition: A | Discharge status: Home / Self Care | Payer: BC Managed Care – PPO | Attending: Family Medicine | Admitting: Family Medicine

## 2019-10-12 ENCOUNTER — Telehealth: Payer: Self-pay

## 2019-10-12 DIAGNOSIS — Z8041 Family history of malignant neoplasm of ovary: Secondary | ICD-10-CM

## 2019-10-12 DIAGNOSIS — F1011 Alcohol abuse, in remission: Secondary | ICD-10-CM | POA: Diagnosis present

## 2019-10-12 DIAGNOSIS — Z6841 Body Mass Index (BMI) 40.0 and over, adult: Secondary | ICD-10-CM

## 2019-10-12 DIAGNOSIS — Z8616 Personal history of COVID-19: Secondary | ICD-10-CM

## 2019-10-12 DIAGNOSIS — J440 Chronic obstructive pulmonary disease with acute lower respiratory infection: Secondary | ICD-10-CM | POA: Diagnosis present

## 2019-10-12 DIAGNOSIS — I10 Essential (primary) hypertension: Secondary | ICD-10-CM | POA: Diagnosis present

## 2019-10-12 DIAGNOSIS — F17290 Nicotine dependence, other tobacco product, uncomplicated: Secondary | ICD-10-CM | POA: Diagnosis present

## 2019-10-12 DIAGNOSIS — Z79899 Other long term (current) drug therapy: Secondary | ICD-10-CM

## 2019-10-12 DIAGNOSIS — J209 Acute bronchitis, unspecified: Secondary | ICD-10-CM | POA: Diagnosis present

## 2019-10-12 DIAGNOSIS — Z7951 Long term (current) use of inhaled steroids: Secondary | ICD-10-CM

## 2019-10-12 DIAGNOSIS — Z23 Encounter for immunization: Secondary | ICD-10-CM

## 2019-10-12 DIAGNOSIS — Z885 Allergy status to narcotic agent status: Secondary | ICD-10-CM

## 2019-10-12 DIAGNOSIS — Z832 Family history of diseases of the blood and blood-forming organs and certain disorders involving the immune mechanism: Secondary | ICD-10-CM

## 2019-10-12 DIAGNOSIS — J441 Chronic obstructive pulmonary disease with (acute) exacerbation: Secondary | ICD-10-CM | POA: Diagnosis not present

## 2019-10-12 DIAGNOSIS — Z823 Family history of stroke: Secondary | ICD-10-CM

## 2019-10-12 DIAGNOSIS — Z833 Family history of diabetes mellitus: Secondary | ICD-10-CM

## 2019-10-12 DIAGNOSIS — Z803 Family history of malignant neoplasm of breast: Secondary | ICD-10-CM

## 2019-10-12 DIAGNOSIS — G4733 Obstructive sleep apnea (adult) (pediatric): Secondary | ICD-10-CM | POA: Diagnosis present

## 2019-10-12 DIAGNOSIS — N529 Male erectile dysfunction, unspecified: Secondary | ICD-10-CM | POA: Diagnosis present

## 2019-10-12 DIAGNOSIS — Z8249 Family history of ischemic heart disease and other diseases of the circulatory system: Secondary | ICD-10-CM

## 2019-10-12 DIAGNOSIS — F1010 Alcohol abuse, uncomplicated: Secondary | ICD-10-CM | POA: Diagnosis present

## 2019-10-12 DIAGNOSIS — K219 Gastro-esophageal reflux disease without esophagitis: Secondary | ICD-10-CM | POA: Diagnosis present

## 2019-10-12 DIAGNOSIS — Z91030 Bee allergy status: Secondary | ICD-10-CM

## 2019-10-12 DIAGNOSIS — Z880 Allergy status to penicillin: Secondary | ICD-10-CM

## 2019-10-12 DIAGNOSIS — Z9119 Patient's noncompliance with other medical treatment and regimen: Secondary | ICD-10-CM

## 2019-10-12 LAB — SARS CORONAVIRUS 2 BY RT PCR (HOSPITAL ORDER, PERFORMED IN ~~LOC~~ HOSPITAL LAB): SARS Coronavirus 2: NEGATIVE

## 2019-10-12 LAB — BASIC METABOLIC PANEL
Anion gap: 12 (ref 5–15)
BUN: 11 mg/dL (ref 6–20)
CO2: 24 mmol/L (ref 22–32)
Calcium: 9 mg/dL (ref 8.9–10.3)
Chloride: 103 mmol/L (ref 98–111)
Creatinine, Ser: 0.95 mg/dL (ref 0.61–1.24)
GFR calc Af Amer: >60 mL/min (ref >60)
GFR calc non Af Amer: >60 mL/min (ref >60)
Glucose, Bld: 110 mg/dL — ABNORMAL HIGH (ref 70–99)
Potassium: 4 mmol/L (ref 3.5–5.1)
Sodium: 139 mmol/L (ref 135–145)

## 2019-10-12 LAB — CBC
HCT: 49.7 % (ref 39.0–52.0)
Hemoglobin: 16.9 g/dL (ref 13.0–17.0)
MCH: 31.6 pg (ref 26.0–34.0)
MCHC: 34 g/dL (ref 30.0–36.0)
MCV: 92.9 fL (ref 80.0–100.0)
Platelets: 211 10*3/uL (ref 150–400)
RBC: 5.35 MIL/uL (ref 4.22–5.81)
RDW: 11.9 % (ref 11.5–15.5)
WBC: 6.3 10*3/uL (ref 4.0–10.5)
nRBC: 0 % (ref 0.0–0.2)

## 2019-10-12 MED ORDER — ALBUTEROL SULFATE HFA 108 (90 BASE) MCG/ACT IN AERS: 6.0000 | INHALATION_SPRAY | Freq: Once | RESPIRATORY_TRACT | Status: DC

## 2019-10-12 MED ORDER — IPRATROPIUM BROMIDE HFA 17 MCG/ACT IN AERS
2.0000 | INHALATION_SPRAY | Freq: Once | RESPIRATORY_TRACT | Status: AC
  Administered 2019-10-12: 2 via RESPIRATORY_TRACT
  Filled 2019-10-12: qty 12.9

## 2019-10-12 MED ORDER — DM-GUAIFENESIN ER 30-600 MG PO TB12
1.0000 | ORAL_TABLET | Freq: Once | ORAL | Status: AC
  Administered 2019-10-12: 1 via ORAL

## 2019-10-12 MED ORDER — AMLODIPINE BESYLATE 10 MG PO TABS
10.0000 mg | ORAL_TABLET | Freq: Every day | ORAL | Status: DC
  Administered 2019-10-13 – 2019-10-14 (×2): 10 mg via ORAL
  Filled 2019-10-12: qty 1
  Filled 2019-10-12: qty 2

## 2019-10-12 MED ORDER — ALBUTEROL SULFATE HFA 108 (90 BASE) MCG/ACT IN AERS
8.0000 | INHALATION_SPRAY | Freq: Once | RESPIRATORY_TRACT | Status: AC
  Administered 2019-10-12: 8 via RESPIRATORY_TRACT
  Filled 2019-10-12: qty 6.7

## 2019-10-12 MED ORDER — ALBUTEROL (5 MG/ML) CONTINUOUS INHALATION SOLN
10.0000 mg/h | INHALATION_SOLUTION | RESPIRATORY_TRACT | Status: DC
  Administered 2019-10-12: 10 mg/h via RESPIRATORY_TRACT
  Filled 2019-10-12: qty 20

## 2019-10-12 MED ORDER — ENOXAPARIN SODIUM 40 MG/0.4ML ~~LOC~~ SOLN: 40.0000 mg | SUBCUTANEOUS | Status: DC

## 2019-10-12 MED ORDER — METHYLPREDNISOLONE SODIUM SUCC 40 MG IJ SOLR
40.0000 mg | Freq: Two times a day (BID) | INTRAMUSCULAR | Status: DC
  Administered 2019-10-13: 40 mg via INTRAVENOUS
  Filled 2019-10-12: qty 1

## 2019-10-12 MED ORDER — BUDESONIDE 0.25 MG/2ML IN SUSP
0.2500 mg | Freq: Two times a day (BID) | RESPIRATORY_TRACT | Status: DC
  Administered 2019-10-13 – 2019-10-14 (×3): 0.25 mg via RESPIRATORY_TRACT
  Filled 2019-10-12 (×3): qty 2

## 2019-10-12 MED ORDER — IPRATROPIUM BROMIDE 0.02 % IN SOLN: 0.5000 mg | RESPIRATORY_TRACT | Status: DC

## 2019-10-12 MED ORDER — ONDANSETRON HCL 4 MG/2ML IJ SOLN: 4.0000 mg | Freq: Four times a day (QID) | INTRAMUSCULAR | Status: DC | PRN

## 2019-10-12 MED ORDER — SODIUM CHLORIDE 0.9% FLUSH: 3.0000 mL | Freq: Once | INTRAVENOUS | Status: DC

## 2019-10-12 MED ORDER — PANTOPRAZOLE SODIUM 40 MG PO TBEC
40.0000 mg | DELAYED_RELEASE_TABLET | Freq: Every day | ORAL | Status: DC
  Administered 2019-10-13 – 2019-10-14 (×2): 40 mg via ORAL
  Filled 2019-10-12 (×2): qty 1

## 2019-10-12 MED ORDER — ALBUTEROL SULFATE (2.5 MG/3ML) 0.083% IN NEBU: 2.5000 mg | INHALATION_SOLUTION | RESPIRATORY_TRACT | Status: DC

## 2019-10-12 MED ORDER — ONDANSETRON HCL 4 MG PO TABS: 4.0000 mg | ORAL_TABLET | Freq: Four times a day (QID) | ORAL | Status: DC | PRN

## 2019-10-12 MED ORDER — ALBUTEROL SULFATE (2.5 MG/3ML) 0.083% IN NEBU
2.5000 mg | INHALATION_SOLUTION | RESPIRATORY_TRACT | Status: DC | PRN
  Administered 2019-10-13: 2.5 mg via RESPIRATORY_TRACT
  Filled 2019-10-12 (×2): qty 3

## 2019-10-12 MED ORDER — SUCRALFATE 1 G PO TABS
1.0000 g | ORAL_TABLET | Freq: Three times a day (TID) | ORAL | Status: DC
  Administered 2019-10-13 – 2019-10-14 (×4): 1 g via ORAL
  Filled 2019-10-12 (×4): qty 1

## 2019-10-12 MED ORDER — AEROCHAMBER PLUS FLO-VU LARGE MISC: 1.0000 | Freq: Once | Status: DC

## 2019-10-12 MED ORDER — METHYLPREDNISOLONE SODIUM SUCC 125 MG IJ SOLR
125.0000 mg | Freq: Once | INTRAMUSCULAR | Status: AC
  Administered 2019-10-12: 125 mg via INTRAVENOUS
  Filled 2019-10-12: qty 2

## 2019-10-12 MED ORDER — DM-GUAIFENESIN ER 30-600 MG PO TB12
1.0000 | ORAL_TABLET | Freq: Two times a day (BID) | ORAL | Status: DC
  Filled 2019-10-12: qty 1

## 2019-10-12 MED ORDER — SODIUM CHLORIDE 0.9 % IV SOLN
500.0000 mg | INTRAVENOUS | Status: DC
  Administered 2019-10-13 (×2): 500 mg via INTRAVENOUS
  Filled 2019-10-12 (×2): qty 500

## 2019-10-12 NOTE — Telephone Encounter (Signed)
Per chart review tab pt is presently at [facility] ED. FYI to Avie Echevaria NP.

## 2019-10-12 NOTE — ED Notes (Signed)
Patient given bag meal with drink. 

## 2019-10-12 NOTE — ED Provider Notes (Signed)
[facility] EMERGENCY DEPARTMENT Provider Note   CSN: [id number] Arrival date & time: 10/12/19  1009     History Chief Complaint  Patient presents with  . Shortness of Breath  . Nasal Congestion    [name] [name] is a 49 y.o. male.  [name] [name] is a 49 y.o. male with a history of COPD, hypertension, GERD, prior Covid infection, who presents to the emergency department for evaluation of 1 of shortness of breath.  He reports symptoms started on Tuesday while he was at work with sneezing coughing and shortness of breath.  He states that symptoms have gotten persistently worse over the past few days especially since the weekend.  Patient states he feels like he cannot catch his breath.  He continues to have persistent nasal congestion and hacking cough, he states he feels like stuff is rattling in his chest but he cannot seem to cough it up.  He has not had any fevers, had Covid back in January, has not gotten his Covid vaccine.  He reports chest pain is only present with coughing, no chest pain at rest.  No lower extremity edema.  He states that he has been told previously that he has COPD, but this feels worse than any episode of bronchitis or acute exacerbation he has had before.        Past Medical History:  Diagnosis Date  . Abdominal pain 07/31/2014  . Abnormal CT scan, small bowel 12/28/2015  . Acute bronchitis 04/09/2013  . Acute conjunctivitis of both eyes 09/04/2017  . Acute pharyngitis 04/13/2013  . ADHD (attention deficit hyperactivity disorder) 10/21/2006   Qualifier: Diagnosis of  By: Maxie Better FNP, Rosalita Levan   . AKI (acute kidney injury) ([place]) 06/26/2017  . Alternating constipation and diarrhea 10/12/2015  . ANGER 10/31/2006   Qualifier: Diagnosis of  By: Maxie Better FNP, Rosalita Levan   . ANXIETY 11/14/2009   Qualifier: Diagnosis of  By: Deborra Medina MD, Tanja Port    . Asthma   . 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   Qualifier: Diagnosis of  By: Maxie Better  FNP, Rosalita Levan   . Bronchitis   . Calculus of gallbladder with acute cholecystitis without obstruction 02/15/2015  . Chest pain 03/17/2018  . Chest wall pain 02/10/2015  . Cholelithiasis 02/13/2015  . Chronic bronchitis ([place])   . Complication of anesthesia    usually wake up in the middle of procedure or surgery  . Conjunctivitis 10/18/2010  . Dehydration 06/26/2017  . DEPENDENT EDEMA 11/09/2008   Qualifier: Diagnosis of  By: Maxie Better FNP, Rosalita Levan   . Diarrhea 11/06/2016  . Enteritis 12/21/2015  . Erectile dysfunction 04/11/2017  . Gallstone 02/10/2015  . Gallstones   . GASTROENTERITIS WITHOUT DEHYDRATION 05/15/2010   Qualifier: Diagnosis of  By: Deborra Medina MD, Tanja Port    . GERD 10/21/2006   Qualifier: Diagnosis of  By: Maxie Better FNP, Rosalita Levan   . GERD (gastroesophageal reflux disease)   . Hepatic steatosis 05/31/2015  . History of colonic polyps 12/28/2015  . Hypertension   . Lumbar back sprain 01/28/2017  . Mole of skin 05/28/2012  . Morbid obesity ([place]) 07/30/2006   Qualifier: Diagnosis of  By: Maxie Better FNP, Rosalita Levan   . Nausea vomiting and diarrhea 07/31/2014  . Obesity   . OSA (obstructive sleep apnea) 07/30/2006   Qualifier: Diagnosis of  By: Maxie Better FNP, Rosalita Levan   . Partial small bowel obstruction ([place]) 12/14/2015  . Right knee pain 12/01/2012  . SBO (small bowel obstruction) ([place])  06/26/2017  . Skin lesion 11/07/2014  . Skin tag 05/28/2012  . Sleep apnea    "suppose to wear mask but I don't" (05/31/2015)  . Small bowel obstruction ([place]) 05/31/2015  . Small bowel obstruction, partial ([place]) 12/13/2015  . Smoker 09/04/2017  . Viral gastroenteritis 03/23/2016    Patient Active Problem List   Diagnosis Date Noted  . Alcohol abuse 09/17/2019  . Nausea and vomiting 07/03/2019  . MDD (major depressive disorder), single episode, moderate ([place]) 06/25/2019  . COVID-19 virus infection 03/27/2019  . Cough 05/29/2018  . Osteoarthritis 04/03/2018  . Preventative  health care 04/03/2018  . Chest pain 03/17/2018  . Smoker 09/04/2017  . COPD with acute exacerbation ([place]) 06/26/2017  . Erectile dysfunction 04/11/2017  . Diarrhea 11/06/2016  . History of colonic polyps 12/28/2015  . Hepatic steatosis 05/31/2015  . Essential hypertension 07/31/2014  . Chronic knee pain 12/01/2012  . Anxiety 11/14/2009  . DEPENDENT EDEMA 11/09/2008  . 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  . ADHD (attention deficit hyperactivity disorder) 10/21/2006  . GERD 10/21/2006  . Morbid obesity ([place]) 07/30/2006  . OSA (obstructive sleep apnea) 07/30/2006  . Hemorrhoids 10/03/2005    Past Surgical History:  Procedure Laterality Date  . CHOLECYSTECTOMY N/A 02/14/2015   Procedure: LAPAROSCOPIC CHOLECYSTECTOMY WITH INTRAOPERATIVE CHOLANGIOGRAM;  Surgeon: Coralie Keens, MD;  Location: [facility];  Service: General;  Laterality: N/A;  . COLONOSCOPY  10/16/2005   Polyps (tubular adenoma 2007), external hemorrhoids  . COLONOSCOPY WITH PROPOFOL N/A 01/05/2016   Procedure: COLONOSCOPY WITH PROPOFOL;  Surgeon: Milus Banister, MD;  Location: [facility];  Service: Endoscopy;  Laterality: N/A;  . FINGER SURGERY Left    reconstructive - 2 surgeries on hand  . LAPAROSCOPIC GASTRIC SLEEVE RESECTION  10/2010    at [facility] History  Problem Relation Age of Onset  . Diabetes Mellitus II Mother   . Breast cancer Mother   . Ovarian cancer Mother   . Diabetes Mellitus II Father   . CAD Father   . Stroke Father   . Cancer Father        ?  . Clotting disorder Father        blood clots in brain with surgery  . Cirrhosis Maternal Grandmother   . Breast cancer Maternal Grandmother     Social History   Tobacco Use  . Smoking status: Former Smoker    Packs/day: 1.00    Years: 20.00    Pack years: 20.00    Types: Cigarettes    Start date: 10/03/2016    Quit date: 10/03/2017    Years since quitting: 2.0  . Smokeless tobacco: Current User    Types: Snuff  Vaping  Use  . Vaping Use: Never used  Substance Use Topics  . Alcohol use: Yes    Alcohol/week: 13.0 standard drinks    Types: 13 Shots of liquor per week    Comment: "a couple of shots a day"  . Drug use: No    Home Medications Prior to Admission medications   Medication Sig Start Date End Date Taking? Authorizing Provider  albuterol (ACCUNEB) 0.63 MG/3ML nebulizer solution USE 1 VIAL IN NEBULIZER EVERY 6 HOURS AS NEEDED FOR WHEEZING OR  SHORTNESS  OF  BREATH 07/02/19   [name], NP  albuterol (PROAIR HFA) 108 (90 Base) MCG/ACT inhaler Inhale 2 puffs into the lungs every 6 (six) hours as needed for wheezing or shortness of breath. 03/02/19  Ria Bush, MD  amLODipine (NORVASC) 10 MG tablet Take 1 tablet by mouth once daily 07/29/19   [name], NP  budesonide-formoterol [facility]) 160-4.5 MCG/ACT inhaler Inhale 2 puffs into the lungs 2 (two) times daily. 08/06/19   [name], NP  cetirizine (ZYRTEC) 10 MG tablet Take 10 mg by mouth daily.    [provider]  cyclobenzaprine (FLEXERIL) 10 MG tablet Take 0.5 tablets (5 mg total) by mouth at bedtime as needed for muscle spasms (sedation caution). 02/03/19   Tonia Ghent, MD  omeprazole (PRILOSEC) 40 MG capsule Take 1 capsule (40 mg total) by mouth daily. 09/12/19 10/12/19  Kinnie Feil, PA-C  ondansetron (ZOFRAN ODT) 4 MG disintegrating tablet Take 1 tablet (4 mg total) by mouth every 8 (eight) hours as needed for nausea or vomiting. 08/31/19   [name], NP  sildenafil (REVATIO) 20 MG tablet Take 2-5 tablets by mouth 30 minutes prior to intercourse. 04/11/17   [name], NP  sucralfate (CARAFATE) 1 g tablet Take 1 tablet (1 g total) by mouth 4 (four) times daily -  with meals and at bedtime. 09/12/19 10/12/19  Kinnie Feil, PA-C    Allergies    Bee venom, Codeine, Hornet venom, and Penicillins  Review of Systems   Review of Systems  Constitutional: Negative for chills and fever.    HENT: Negative.   Respiratory: Positive for cough and shortness of breath.   Cardiovascular: Positive for chest pain (w/ cough).  Gastrointestinal: Negative for abdominal pain, nausea and vomiting.  Genitourinary: Negative for dysuria and frequency.  Musculoskeletal: Negative for arthralgias and myalgias.  Skin: Negative for color change and rash.  Neurological: Negative for dizziness, syncope and light-headedness.  All other systems reviewed and are negative.   Physical Exam Updated Vital Signs BP (!) 151/91 (BP Location: Left Arm)   Pulse 93   Temp 98.1 F (36.7 C) (Oral)   Resp (!) 22   SpO2 99%   Physical Exam Vitals and nursing note reviewed.  Constitutional:      General: He is not in acute distress.    Appearance: He is well-developed. He is obese. He is ill-appearing. He is not diaphoretic.     Comments: Somewhat ill-appearing with increased respiratory effort  HENT:     Head: Normocephalic and atraumatic.     Mouth/Throat:     Mouth: Mucous membranes are moist.     Pharynx: Oropharynx is clear.  Eyes:     General:        Right eye: No discharge.        Left eye: No discharge.     Pupils: Pupils are equal, round, and reactive to light.  Cardiovascular:     Rate and Rhythm: Normal rate and regular rhythm.     Heart sounds: Normal heart sounds. No murmur heard.  No friction rub. No gallop.   Pulmonary:     Effort: Tachypnea and accessory muscle usage present. No respiratory distress.     Breath sounds: Wheezing present. No rales.     Comments: Patient is tachypneic with increased work of breathing and accessory muscle use, despite O2 sats of 98-100% on room air, on exam he has diffuse expiratory wheezing bilaterally with decreased air movement Chest:     Chest wall: No tenderness.  Abdominal:     General: Bowel sounds are normal. There is no distension.     Palpations: Abdomen is soft. There is no mass.  Tenderness: There is no abdominal tenderness. There  is no guarding.     Comments: Abdomen soft, nondistended, nontender to palpation in all quadrants without guarding or peritoneal signs  Musculoskeletal:        General: No deformity.     Cervical back: Neck supple.     Right lower leg: No tenderness. No edema.     Left lower leg: No tenderness. No edema.  Skin:    General: Skin is warm and dry.     Capillary Refill: Capillary refill takes less than 2 seconds.  Neurological:     Mental Status: He is alert.     Coordination: Coordination normal.     Comments: Speech is clear, able to follow commands Moves extremities without ataxia, coordination intact  Psychiatric:        Mood and Affect: Mood normal.        Behavior: Behavior normal.     ED Results / Procedures / Treatments   Labs (all labs ordered are listed, but only abnormal results are displayed) Labs Reviewed  BASIC METABOLIC PANEL - Abnormal; Notable for the following components:      Result Value   Glucose, Bld 110 (*)    All other components within normal limits  SARS CORONAVIRUS 2 BY RT PCR (HOSPITAL ORDER, [facility])  CBC    EKG None  Radiology DG Chest 2 View  Result Date: 10/12/2019 CLINICAL DATA:  Shortness of breath, chest pain EXAM: CHEST - 2 VIEW COMPARISON:  07/16/2019 FINDINGS: The heart size and mediastinal contours are within normal limits. Both lungs are clear. The visualized skeletal structures are unremarkable. IMPRESSION: No active cardiopulmonary disease. Electronically Signed   By: Kathreen Devoid   On: 10/12/2019 10:58    Procedures .Critical Care Performed by: Jacqlyn Larsen, PA-C Authorized by: Jacqlyn Larsen, PA-C   Critical care provider statement:    Critical care time (minutes):  45   Critical care was necessary to treat or prevent imminent or life-threatening deterioration of the following conditions:  Respiratory failure   Critical care was time spent personally by me on the following activities:  Discussions  with consultants, evaluation of patient's response to treatment, examination of patient, ordering and performing treatments and interventions, ordering and review of laboratory studies, ordering and review of radiographic studies, pulse oximetry, re-evaluation of patient's condition, obtaining history from patient or surrogate and review of old charts   (including critical care time)  Medications Ordered in ED Medications  sodium chloride flush (NS) 0.9 % injection 3 mL (has no administration in time range)  AeroChamber Plus Flo-Vu Large MISC 1 each (has no administration in time range)  dextromethorphan-guaiFENesin (MUCINEX DM) 30-600 MG per 12 hr tablet 1 tablet (has no administration in time range)  albuterol (PROVENTIL,VENTOLIN) solution continuous neb (0 mg/hr Nebulization Stopped 10/12/19 2036)  methylPREDNISolone sodium succinate (SOLU-MEDROL) 125 mg/2 mL injection 125 mg (125 mg Intravenous Given 10/12/19 1812)  albuterol (VENTOLIN HFA) 108 (90 Base) MCG/ACT inhaler 8 puff (8 puffs Inhalation Given 10/12/19 1739)  ipratropium (ATROVENT HFA) inhaler 2 puff (2 puffs Inhalation Given 10/12/19 1740)    ED Course  I have reviewed the triage vital signs and the nursing notes.  Pertinent labs & imaging results that were available during my care of the patient were reviewed by me and considered in my medical decision making (see chart for details).    MDM Rules/Calculators/A&P  49 year old male presents with shortness of breath and increased work of breathing, with diffuse wheezing noted on exam, he has increased respiratory effort and accessory muscle use, despite this he has normal O2 sats, he is coughing repeatedly.  Reports chest pain but only during cough.  States he can only stand to walk short distances due to increased SOB when walking. Has had COVID previously, is unvaccinated.  Will get basic labs, EKG, chest x-ray and Covid test.  Will give bronchodilators and  Solu-Medrol.  I have independently ordered, reviewed and interpreted all labs and imaging: CBC: No leukocytosis, normal hemoglobin BMP: Glucose 165, CO2 21 but no other electrolyte derangements, normal anion gap, normal renal function Covid: Negative Chest x-ray: No active cardiopulmonary disease  After initial inhalers patient had some improvement in his breathing, but continues to have some wheezing and tachypnea, now that Covid has returned negative will give an hour continuous albuterol as well as cough medication.  After patient had continuous albuterol treatment he states that he feels that he can take a deeper breath, but when he gets up to walk even a short distance he becomes tachypneic with increased respiratory effort again and states he feels like he just ran a mile.  He continues to have frequent cough  Case discussed with Dr. Hal Hope who will see and admit the patient.  Final Clinical Impression(s) / ED Diagnoses Final diagnoses:  COPD exacerbation [facility])    Rx / DC Orders ED Discharge Orders    None       Janet Berlin 10/13/19 1424    Virgel Manifold, MD 10/19/19 [phone]

## 2019-10-12 NOTE — Telephone Encounter (Signed)
[facility] - Client TELEPHONE ADVICE RECORD Access[facility]urse Patient Name: [name] [name] Gender: Male DOB: 05-Dec-1970 Age: 49 Y 9 M 19 D Return Phone Number: [phone] (Primary) Address: City/State/Zip: [place] [address] Client [facility] - Client Client Site [place] - Day Physician Alma Friendly - NP Contact Type Call Who Is Calling Patient / Member / Family / Caregiver Call Type Triage / Clinical Relationship To Patient Self Return Phone Number [phone] (Primary) Chief Complaint BREATHING - shortness of breath or sounds breathless Reason for Call Symptomatic / Request for [name] states he has a cough with shortness of breath. Translation No Nurse Assessment Nurse: Thad Ranger, RN, Denise Date/Time (Eastern Time): 10/12/2019 8:37:26 AM Confirm and document reason for call. If symptomatic, describe symptoms. ---Caller states he has a cough with shortness of breath. Has the patient had close contact with a person known or suspected to have the novel coronavirus illness OR traveled / lives in area with major community spread (including international travel) in the last 14 days from the onset of symptoms? * If Asymptomatic, screen for exposure and travel within the last 14 days. ---No Does the patient have any new or worsening symptoms? ---Yes Will a triage be completed? ---Yes Related visit to physician within the last 2 weeks? ---No Does the PT have any chronic conditions? (i.e. diabetes, asthma, this includes High risk factors for pregnancy, etc.) ---Yes List chronic conditions. ---COPD, chronic bronchitis Is this a behavioral health or substance abuse call? ---No Guidelines Guideline Title Affirmed Question Affirmed Notes Nurse Date/Time (Eastern Time) Breathing Difficulty [1] MODERATE difficulty breathing (e.g., speaks in phrases, SOB even at rest,  pulse 100-120) AND [2] NEWonset or WORSE than normal Carmon, RN, Langley Gauss 10/12/2019 8:39:01 AM PLEASE NOTE: All timestamps contained within this report are represented as [place] Standard Time. CONFIDENTIALTY NOTICE: This fax transmission is intended only for the addressee. It contains information that is legally privileged, confidential or otherwise protected from use or disclosure. If you are not the intended recipient, you are strictly prohibited from reviewing, disclosing, copying using or disseminating any of this information or taking any action in reliance on or regarding this information. If you have received this fax in error, please notify us immediately by telephone so that we can arrange for its return to [place]. Phone: [phone], Toll-Free: [phone], Fax: [phone] Page: 2 of 2 Call Id: [id number] [place]. Time Eilene Ghazi Time) Disposition Final User 10/12/2019 8:34:48 AM Send to Urgent Queue Memory Argue 10/12/2019 8:49:20 AM Call Completed Thad Ranger, RN, Langley Gauss 10/12/2019 8:42:16 AM Go to ED Now Yes Thad Ranger, RN, Yevette Edwards Disagree/Comply Disagree Caller Understands Yes PreDisposition Call Doctor Care Advice Given Per Guideline GO TO ED NOW: NOTE TO TRIAGER - DRIVING: * Another adult should drive. BRING MEDICINES: * Please bring a list of your current medicines when you go to the Emergency Department (ER). CARE ADVICE given per Breathing Difficulty (Adult) guideline. Comments User: Romeo Apple, RN Date/Time Eilene Ghazi Time): 10/12/2019 8:42:14 AM Advised will notify MD of pt refusal to go to ER and MDO will cb soon. User: Romeo Apple, RN Date/Time Eilene Ghazi Time): 10/12/2019 8:49:05 AM Called MDO BL to give pt report. RN was trans back to [facility] triage and placed on lengthly hold. Returned call to the pt and advised to call MDO for appt. Referrals GO TO FACILITY REFUSED

## 2019-10-12 NOTE — Telephone Encounter (Signed)
Noted  

## 2019-10-12 NOTE — ED Triage Notes (Signed)
Pt. Stated, I started having sneezing and then SOB and Chest pain on Tuesday. Its just got worse or stayed the same.

## 2019-10-12 NOTE — H&P (Signed)
History and Physical    Manraj Yeo GMW:[id number] DOB: February 25, 1971 DOA: 10/12/2019  PCP: [name], NP  Patient coming from: Home.  Chief Complaint: Shortness of breath.  HPI: [name] [name] is a 49 y.o. male with history of hypertension, ongoing tobacco and alcohol abuse presents to the ER because of shortness of breath with wheezing and productive cough for the last 4 to 5 days. Patient has some chest pain on coughing otherwise chest pain-free. Denies fever chills. Patient states he has been using his albuterol inhaler despite which patient is still short of breath.  ED Course: In the ER on exam patient has bilateral expiratory wheeze with chest x-ray showing nothing acute Covid test was negative EKG showed normal sinus rhythm. Troponins were negative. Labs are largely unremarkable. Patient admitted for acute bronchitis.  Review of Systems: As per HPI, rest all negative.   Past Medical History:  Diagnosis Date  . Abdominal pain 07/31/2014  . Abnormal CT scan, small bowel 12/28/2015  . Acute bronchitis 04/09/2013  . Acute conjunctivitis of both eyes 09/04/2017  . Acute pharyngitis 04/13/2013  . ADHD (attention deficit hyperactivity disorder) 10/21/2006   Qualifier: Diagnosis of  By: Maxie Better FNP, Rosalita Levan   . AKI (acute kidney injury) ([place]) 06/26/2017  . Alternating constipation and diarrhea 10/12/2015  . ANGER 10/31/2006   Qualifier: Diagnosis of  By: Maxie Better FNP, Rosalita Levan   . ANXIETY 11/14/2009   Qualifier: Diagnosis of  By: Deborra Medina MD, Tanja Port    . Asthma   . 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 Bronchitis   . Calculus of gallbladder with acute cholecystitis without obstruction 02/15/2015  . Chest pain 03/17/2018  . Chest wall pain 02/10/2015  . Cholelithiasis 02/13/2015  . Chronic bronchitis ([place])   . Complication of anesthesia    usually wake up in the middle of procedure or surgery  .  Conjunctivitis 10/18/2010  . Dehydration 06/26/2017  . DEPENDENT EDEMA 11/09/2008   Qualifier: Diagnosis of  By: Maxie Better FNP, Rosalita Levan   . Diarrhea 11/06/2016  . Enteritis 12/21/2015  . Erectile dysfunction 04/11/2017  . Gallstone 02/10/2015  . Gallstones   . GASTROENTERITIS WITHOUT DEHYDRATION 05/15/2010   Qualifier: Diagnosis of  By: Deborra Medina MD, Tanja Port    . GERD 10/21/2006   Qualifier: Diagnosis of  By: Maxie Better FNP, Rosalita Levan   . GERD (gastroesophageal reflux disease)   . Hepatic steatosis 05/31/2015  . History of colonic polyps 12/28/2015  . Hypertension   . Lumbar back sprain 01/28/2017  . Mole of skin 05/28/2012  . Morbid obesity ([place]) 07/30/2006   Qualifier: Diagnosis of  By: Maxie Better FNP, Rosalita Levan   . Nausea vomiting and diarrhea 07/31/2014  . Obesity   . OSA (obstructive sleep apnea) 07/30/2006   Qualifier: Diagnosis of  By: Maxie Better FNP, Rosalita Levan   . Partial small bowel obstruction ([place]) 12/14/2015  . Right knee pain 12/01/2012  . SBO (small bowel obstruction) ([place]) 06/26/2017  . Skin lesion 11/07/2014  . Skin tag 05/28/2012  . Sleep apnea    "suppose to wear mask but I don't" (05/31/2015)  . Small bowel obstruction ([place]) 05/31/2015  . Small bowel obstruction, partial ([place]) 12/13/2015  . Smoker 09/04/2017  . Viral gastroenteritis 03/23/2016    Past Surgical History:  Procedure Laterality Date  . CHOLECYSTECTOMY N/A 02/14/2015   Procedure: LAPAROSCOPIC CHOLECYSTECTOMY WITH INTRAOPERATIVE CHOLANGIOGRAM;  Surgeon: Coralie Keens, MD;  Location: [facility];  Service: General;  Laterality: N/A;  . COLONOSCOPY  10/16/2005   Polyps (tubular adenoma 2007), external hemorrhoids  . COLONOSCOPY WITH PROPOFOL N/A 01/05/2016   Procedure: COLONOSCOPY WITH PROPOFOL;  Surgeon: Milus Banister, MD;  Location: [facility];  Service: Endoscopy;  Laterality: N/A;  . FINGER SURGERY Left    reconstructive - 2 surgeries on hand  . LAPAROSCOPIC GASTRIC SLEEVE RESECTION  10/2010    at  [place]     reports that he quit smoking about 2 years ago. His smoking use included cigarettes. He started smoking about 3 years ago. He has a 20.00 pack-year smoking history. His smokeless tobacco use includes snuff. He reports current alcohol use of about 13.0 standard drinks of alcohol per week. He reports that he does not use drugs.  Allergies  Allergen Reactions  . Bee Venom Anaphylaxis  . Codeine Anaphylaxis    Tolerates dilaudid  . Hornet Venom Anaphylaxis  . Penicillins Hives and Other (See Comments)    Has patient had a PCN reaction causing immediate rash, facial/tongue/throat swelling, SOB or lightheadedness with hypotensionYES Has patient had a PCN reaction causing severe rash involving mucus membranes or skin necrosis: NO Has patient had a PCN reaction that required hospitalization NO Has patient had a PCN reaction occurring within the last 10 years: NO If all of the above answers are "NO", then may proceed with Cephalosporin use.    Family History  Problem Relation Age of Onset  . Diabetes Mellitus II Mother   . Breast cancer Mother   . Ovarian cancer Mother   . Diabetes Mellitus II Father   . CAD Father   . Stroke Father   . Cancer Father        ?  . Clotting disorder Father        blood clots in brain with surgery  . Cirrhosis Maternal Grandmother   . Breast cancer Maternal Grandmother     Prior to Admission medications   Medication Sig Start Date End Date Taking? Authorizing Provider  albuterol (ACCUNEB) 0.63 MG/3ML nebulizer solution USE 1 VIAL IN NEBULIZER EVERY 6 HOURS AS NEEDED FOR WHEEZING OR  SHORTNESS  OF  BREATH Patient taking differently: Take 1 ampule by nebulization every 6 (six) hours as needed for wheezing or shortness of breath.  07/02/19  Yes [name], NP  albuterol (PROAIR HFA) 108 (90 Base) MCG/ACT inhaler Inhale 2 puffs into the lungs every 6 (six) hours as needed for wheezing or shortness of breath. 03/02/19  Yes Ria Bush, MD    amLODipine (NORVASC) 10 MG tablet Take 1 tablet by mouth once daily Patient taking differently: Take 10 mg by mouth daily.  07/29/19  Yes [name], NP  budesonide-formoterol [facility]) 160-4.5 MCG/ACT inhaler Inhale 2 puffs into the lungs 2 (two) times daily. 08/06/19  Yes [name], NP  cetirizine (ZYRTEC) 10 MG tablet Take 10 mg by mouth daily.   Yes [provider]  cyclobenzaprine (FLEXERIL) 10 MG tablet Take 0.5 tablets (5 mg total) by mouth at bedtime as needed for muscle spasms (sedation caution). 02/03/19  Yes Tonia Ghent, MD  omeprazole (PRILOSEC) 40 MG capsule Take 1 capsule (40 mg total) by mouth daily. 09/12/19 10/12/19 Yes Carmon Sails J, PA-C  ondansetron (ZOFRAN ODT) 4 MG disintegrating tablet Take 1 tablet (4 mg total) by mouth every 8 (eight) hours as needed for nausea or vomiting. 08/31/19  Yes [name], NP  sildenafil (REVATIO) 20 MG tablet Take 2-5  tablets by mouth 30 minutes prior to intercourse. 04/11/17  Yes [name], NP  sucralfate (CARAFATE) 1 g tablet Take 1 tablet (1 g total) by mouth 4 (four) times daily -  with meals and at bedtime. 09/12/19 10/12/19 Yes Kinnie Feil, PA-C    Physical Exam: Constitutional: Moderately built and nourished. Vitals:   10/12/19 1512 10/12/19 1645 10/12/19 1934 10/12/19 2108  BP: (!) 142/90 (!) 151/91  (!) 158/102  Pulse: 93   (!) 109  Resp: 15 (!) 22  (!) 22  Temp:    98.2 F (36.8 C)  TempSrc:    Oral  SpO2: 100% 99% 96% 92%   Eyes: Anicteric no pallor. ENMT: No discharge from the ears eyes nose or mouth. Neck: No mass felt. No neck rigidity. No JVD appreciated. Respiratory: Bilateral expiratory wheeze heard. Cardiovascular: S1-S2 heard. Abdomen: Soft nontender bowel sounds present. Musculoskeletal: No edema. Skin: No rash. Neurologic: Alert awake oriented to time place and person. Moves all extremities. Psychiatric: Appears normal. Normal affect.   Labs on Admission: I  have personally reviewed following labs and imaging studies  CBC: Recent Labs  Lab 10/12/19 1044  WBC 6.3  HGB 16.9  HCT 49.7  MCV 92.9  PLT 175   Basic Metabolic Panel: Recent Labs  Lab 10/12/19 1044  NA 139  K 4.0  CL 103  CO2 24  GLUCOSE 110*  BUN 11  CREATININE 0.95  CALCIUM 9.0   GFR: CrCl cannot be calculated (Unknown ideal weight.). Liver Function Tests: No results for input(s): AST, ALT, ALKPHOS, BILITOT, PROT, ALBUMIN in the last 168 hours. No results for input(s): LIPASE, AMYLASE in the last 168 hours. No results for input(s): AMMONIA in the last 168 hours. Coagulation Profile: No results for input(s): INR, PROTIME in the last 168 hours. Cardiac Enzymes: No results for input(s): CKTOTAL, CKMB, CKMBINDEX, TROPONINI in the last 168 hours. BNP (last 3 results) Recent Labs    08/06/19 1116  PROBNP 50.0   HbA1C: No results for input(s): HGBA1C in the last 72 hours. CBG: No results for input(s): GLUCAP in the last 168 hours. Lipid Profile: No results for input(s): CHOL, HDL, LDLCALC, TRIG, CHOLHDL, LDLDIRECT in the last 72 hours. Thyroid Function Tests: No results for input(s): TSH, T4TOTAL, FREET4, T3FREE, THYROIDAB in the last 72 hours. Anemia Panel: No results for input(s): VITAMINB12, FOLATE, FERRITIN, TIBC, IRON, RETICCTPCT in the last 72 hours. Urine analysis:    Component Value Date/Time   COLORURINE YELLOW 09/12/2019 0413   APPEARANCEUR CLEAR 09/12/2019 0413   LABSPEC 1.015 09/12/2019 0413   PHURINE 5.0 09/12/2019 0413   GLUCOSEU NEGATIVE 09/12/2019 0413   HGBUR NEGATIVE 09/12/2019 0413   BILIRUBINUR NEGATIVE 09/12/2019 0413   BILIRUBINUR Neg 11/06/2016 1018   KETONESUR NEGATIVE 09/12/2019 0413   PROTEINUR NEGATIVE 09/12/2019 0413   UROBILINOGEN 0.2 11/06/2016 1018   UROBILINOGEN 1.0 05/18/2008 1235   NITRITE NEGATIVE 09/12/2019 0413   LEUKOCYTESUR NEGATIVE 09/12/2019 0413   Sepsis  Labs: @LABRCNTIP (procalcitonin:4,lacticidven:4) ) Recent Results (from the past 240 hour(s))  SARS Coronavirus 2 by RT PCR (hospital order, performed in [facility] [place] hospital lab) Nasopharyngeal Nasopharyngeal Swab     Status: None   Collection Time: 10/12/19  5:43 PM   Specimen: Nasopharyngeal Swab  Result Value Ref Range Status   SARS Coronavirus 2 NEGATIVE NEGATIVE Final    Comment: (NOTE) SARS-CoV-2 target nucleic acids are NOT DETECTED.  The SARS-CoV-2 RNA is generally detectable in upper and lower respiratory specimens during the acute  phase of infection. The lowest concentration of SARS-CoV-2 viral copies this assay can detect is 250 copies / mL. A negative result does not preclude SARS-CoV-2 infection and should not be used as the sole basis for treatment or other patient management decisions.  A negative result may occur with improper specimen collection / handling, submission of specimen other than nasopharyngeal swab, presence of viral mutation(s) within the areas targeted by this assay, and inadequate number of viral copies (<250 copies / mL). A negative result must be combined with clinical observations, patient history, and epidemiological information.  Fact Sheet for Patients:   StrictlyIdeas.no  Fact Sheet for Healthcare Providers: BankingDealers.co.za  This test is not yet approved or  cleared by the [place] FDA and has been authorized for detection and/or diagnosis of SARS-CoV-2 by FDA under an Emergency Use Authorization (EUA).  This EUA will remain in effect (meaning this test can be used) for the duration of the COVID-19 declaration under Section 564(b)(1) of the Act, 21 U.S.C. section 360bbb-3(b)(1), unless the authorization is terminated or revoked sooner.  Performed at [facility], [place] [address] [place], [place] [address]      Radiological Exams on Admission: DG Chest 2 View  Result Date:  10/12/2019 CLINICAL DATA:  Shortness of breath, chest pain EXAM: CHEST - 2 VIEW COMPARISON:  07/16/2019 FINDINGS: The heart size and mediastinal contours are within normal limits. Both lungs are clear. The visualized skeletal structures are unremarkable. IMPRESSION: No active cardiopulmonary disease. Electronically Signed   By: Kathreen Devoid   On: 10/12/2019 10:58    EKG: Independently reviewed. Normal sinus rhythm.  Assessment/Plan Principal Problem:   Acute bronchitis Active Problems:   Morbid obesity (HCC)   GERD   OSA (obstructive sleep apnea)   Alcohol abuse    1. Acute bronchitis -patient has been placed on nebulizer IV steroids Pulmicort Zithromax. 2. Hypertension on amlodipine. 3. Tobacco and alcohol abuse advised about quitting and on CIWA. 4. History of GERD on PPI. 5. History of sleep apnea.   DVT prophylaxis: Lovenox. Code Status: Full code. Family Communication: Discussed with patient. Disposition Plan: Home. Consults called: None. Admission status: Observation.   Rise Patience MD [facility] Pager [phone].  If 7PM-7AM, please contact night-coverage [email or web address] Password [email or web address]  10/12/2019, 11:20 PM

## 2019-10-12 NOTE — ED Triage Notes (Signed)
Pt. Stated, [name] [name] been SOB since Tuesday. I started out sneezing then all of a sudden got SOB and Chest pain.

## 2019-10-12 NOTE — Telephone Encounter (Signed)
Carrie at front desk said that access nurse was on the phone that pt had SOB and chest congestion and refused to go to ED. When I went on phone no one was there. 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 Presently pt does not have a thermometer and pt does not think he has a fever. Pt has a prod cough with white phlegm. Pt has SOB upon exertion and has slight SOB when sitting and laying.pt has wheezing on and off. Pt feels like something is stuck in throat but cannot cough it up. Pt is having chest pain on both sides of lungs and pt thinks that is from congestion but cannot get a deep breath.pt said has had this problem for 5 months and no one can tell him what is causing it. Pt does not have other covid symptoms. Pt said he is not going to an UC that he wants to be seen at his doctor's office. I explained to pt that due to his respiratory symptoms I cannot bring him into the office to be seen and with pts symptoms it sounds like he needs in person visit so a provider can listen to his lungs and do any needed testing such as CXR if needed. 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 I advised that our guidelines are different than the UC & ED guidelines and pt said he guesses that we do not have to keep an oath. I advised pt again of our guidelines and why we have them and I spoke with Avie Echevaria NP who advised that pt does need to go to UC to be seen for eval. Pt said he wants to come to his doctors office and not go to a stranger to be seen. I explained again the guidelines and apologized that it was not my intent to upset him and that I did understand that he did not feel well and asked pt which UC he would go to and pt said he was not aware of an [facility]; I advised pt the location of 3 [facility] and pt said his insurance does not cover UC or ED.  Then pt said he would probably go to [facility] and he "thanked me for not helping him" and pt hung up while I was talking. FYI to Avie Echevaria NP.

## 2019-10-13 ENCOUNTER — Other Ambulatory Visit: Payer: Self-pay

## 2019-10-13 ENCOUNTER — Encounter (HOSPITAL_COMMUNITY): Payer: Self-pay | Admitting: Internal Medicine

## 2019-10-13 ENCOUNTER — Inpatient Hospital Stay (HOSPITAL_COMMUNITY): Payer: BC Managed Care – PPO

## 2019-10-13 DIAGNOSIS — J440 Chronic obstructive pulmonary disease with acute lower respiratory infection: Secondary | ICD-10-CM | POA: Diagnosis present

## 2019-10-13 DIAGNOSIS — F17290 Nicotine dependence, other tobacco product, uncomplicated: Secondary | ICD-10-CM | POA: Diagnosis present

## 2019-10-13 DIAGNOSIS — Z8041 Family history of malignant neoplasm of ovary: Secondary | ICD-10-CM | POA: Diagnosis not present

## 2019-10-13 DIAGNOSIS — Z6841 Body Mass Index (BMI) 40.0 and over, adult: Secondary | ICD-10-CM | POA: Diagnosis not present

## 2019-10-13 DIAGNOSIS — G4733 Obstructive sleep apnea (adult) (pediatric): Secondary | ICD-10-CM | POA: Diagnosis present

## 2019-10-13 DIAGNOSIS — Z9119 Patient's noncompliance with other medical treatment and regimen: Secondary | ICD-10-CM | POA: Diagnosis not present

## 2019-10-13 DIAGNOSIS — N529 Male erectile dysfunction, unspecified: Secondary | ICD-10-CM | POA: Diagnosis present

## 2019-10-13 DIAGNOSIS — Z833 Family history of diabetes mellitus: Secondary | ICD-10-CM | POA: Diagnosis not present

## 2019-10-13 DIAGNOSIS — J441 Chronic obstructive pulmonary disease with (acute) exacerbation: Secondary | ICD-10-CM | POA: Diagnosis present

## 2019-10-13 DIAGNOSIS — R0602 Shortness of breath: Secondary | ICD-10-CM

## 2019-10-13 DIAGNOSIS — Z91030 Bee allergy status: Secondary | ICD-10-CM | POA: Diagnosis not present

## 2019-10-13 DIAGNOSIS — Z8616 Personal history of COVID-19: Secondary | ICD-10-CM | POA: Diagnosis not present

## 2019-10-13 DIAGNOSIS — I10 Essential (primary) hypertension: Secondary | ICD-10-CM | POA: Diagnosis present

## 2019-10-13 DIAGNOSIS — Z7951 Long term (current) use of inhaled steroids: Secondary | ICD-10-CM | POA: Diagnosis not present

## 2019-10-13 DIAGNOSIS — Z23 Encounter for immunization: Secondary | ICD-10-CM | POA: Diagnosis not present

## 2019-10-13 DIAGNOSIS — K219 Gastro-esophageal reflux disease without esophagitis: Secondary | ICD-10-CM | POA: Diagnosis present

## 2019-10-13 DIAGNOSIS — Z79899 Other long term (current) drug therapy: Secondary | ICD-10-CM | POA: Diagnosis not present

## 2019-10-13 DIAGNOSIS — Z832 Family history of diseases of the blood and blood-forming organs and certain disorders involving the immune mechanism: Secondary | ICD-10-CM | POA: Diagnosis not present

## 2019-10-13 DIAGNOSIS — Z885 Allergy status to narcotic agent status: Secondary | ICD-10-CM | POA: Diagnosis not present

## 2019-10-13 DIAGNOSIS — F1010 Alcohol abuse, uncomplicated: Secondary | ICD-10-CM | POA: Diagnosis present

## 2019-10-13 DIAGNOSIS — J209 Acute bronchitis, unspecified: Secondary | ICD-10-CM

## 2019-10-13 DIAGNOSIS — Z803 Family history of malignant neoplasm of breast: Secondary | ICD-10-CM | POA: Diagnosis not present

## 2019-10-13 DIAGNOSIS — Z880 Allergy status to penicillin: Secondary | ICD-10-CM | POA: Diagnosis not present

## 2019-10-13 DIAGNOSIS — Z823 Family history of stroke: Secondary | ICD-10-CM | POA: Diagnosis not present

## 2019-10-13 HISTORY — DX: Chronic obstructive pulmonary disease with (acute) exacerbation: J44.1

## 2019-10-13 LAB — CBC
HCT: 48.5 % (ref 39.0–52.0)
HCT: 49.6 % (ref 39.0–52.0)
Hemoglobin: 16.7 g/dL (ref 13.0–17.0)
Hemoglobin: 16.9 g/dL (ref 13.0–17.0)
MCH: 31.9 pg (ref 26.0–34.0)
MCH: 31.5 pg (ref 26.0–34.0)
MCHC: 34.4 g/dL (ref 30.0–36.0)
MCHC: 34.1 g/dL (ref 30.0–36.0)
MCV: 92.6 fL (ref 80.0–100.0)
MCV: 92.4 fL (ref 80.0–100.0)
Platelets: 187 10*3/uL (ref 150–400)
Platelets: 210 10*3/uL (ref 150–400)
RBC: 5.24 MIL/uL (ref 4.22–5.81)
RBC: 5.37 MIL/uL (ref 4.22–5.81)
RDW: 11.8 % (ref 11.5–15.5)
RDW: 11.9 % (ref 11.5–15.5)
WBC: 7.2 10*3/uL (ref 4.0–10.5)
WBC: 6.9 10*3/uL (ref 4.0–10.5)
nRBC: 0 % (ref 0.0–0.2)
nRBC: 0 % (ref 0.0–0.2)

## 2019-10-13 LAB — ECHOCARDIOGRAM COMPLETE
Height: 74 in
Weight: 6251.2 oz

## 2019-10-13 LAB — BASIC METABOLIC PANEL
Anion gap: 13 (ref 5–15)
BUN: 11 mg/dL (ref 6–20)
CO2: 21 mmol/L — ABNORMAL LOW (ref 22–32)
Calcium: 9.2 mg/dL (ref 8.9–10.3)
Chloride: 102 mmol/L (ref 98–111)
Creatinine, Ser: 0.97 mg/dL (ref 0.61–1.24)
GFR calc Af Amer: >60 mL/min (ref >60)
GFR calc non Af Amer: >60 mL/min (ref >60)
Glucose, Bld: 165 mg/dL — ABNORMAL HIGH (ref 70–99)
Potassium: 3.8 mmol/L (ref 3.5–5.1)
Sodium: 136 mmol/L (ref 135–145)

## 2019-10-13 LAB — CREATININE, SERUM
Creatinine, Ser: 1.09 mg/dL (ref 0.61–1.24)
GFR calc Af Amer: >60 mL/min (ref >60)
GFR calc non Af Amer: >60 mL/min (ref >60)

## 2019-10-13 LAB — TROPONIN I (HIGH SENSITIVITY): Troponin I (High Sensitivity): 2 ng/L (ref <18)

## 2019-10-13 LAB — HIV ANTIBODY (ROUTINE TESTING W REFLEX): HIV Screen 4th Generation wRfx: NONREACTIVE

## 2019-10-13 MED ORDER — IOHEXOL 350 MG/ML SOLN
75.0000 mL | Freq: Once | INTRAVENOUS | Status: AC | PRN
  Administered 2019-10-13: 75 mL via INTRAVENOUS

## 2019-10-13 MED ORDER — ADULT MULTIVITAMIN W/MINERALS CH
1.0000 | ORAL_TABLET | Freq: Every day | ORAL | Status: DC
  Administered 2019-10-13 – 2019-10-14 (×2): 1 via ORAL
  Filled 2019-10-13 (×2): qty 1

## 2019-10-13 MED ORDER — METHYLPREDNISOLONE SODIUM SUCC 40 MG IJ SOLR
40.0000 mg | Freq: Every day | INTRAMUSCULAR | Status: DC
  Administered 2019-10-14: 40 mg via INTRAVENOUS
  Filled 2019-10-13: qty 1

## 2019-10-13 MED ORDER — LORAZEPAM 1 MG PO TABS
1.0000 mg | ORAL_TABLET | ORAL | Status: DC | PRN
  Administered 2019-10-13: 1 mg via ORAL
  Filled 2019-10-13: qty 1

## 2019-10-13 MED ORDER — POTASSIUM CHLORIDE CRYS ER 20 MEQ PO TBCR
40.0000 meq | EXTENDED_RELEASE_TABLET | Freq: Once | ORAL | Status: AC
  Administered 2019-10-13: 40 meq via ORAL
  Filled 2019-10-13: qty 2

## 2019-10-13 MED ORDER — THIAMINE HCL 100 MG/ML IJ SOLN: 100.0000 mg | Freq: Every day | INTRAMUSCULAR | Status: DC

## 2019-10-13 MED ORDER — ENOXAPARIN SODIUM 40 MG/0.4ML ~~LOC~~ SOLN
40.0000 mg | Freq: Two times a day (BID) | SUBCUTANEOUS | Status: DC
  Administered 2019-10-13 – 2019-10-14 (×2): 40 mg via SUBCUTANEOUS
  Filled 2019-10-13 (×2): qty 0.4

## 2019-10-13 MED ORDER — IPRATROPIUM-ALBUTEROL 0.5-2.5 (3) MG/3ML IN SOLN
3.0000 mL | Freq: Two times a day (BID) | RESPIRATORY_TRACT | Status: DC
  Administered 2019-10-13 – 2019-10-14 (×4): 3 mL via RESPIRATORY_TRACT
  Filled 2019-10-13 (×4): qty 3

## 2019-10-13 MED ORDER — PNEUMOCOCCAL VAC POLYVALENT 25 MCG/0.5ML IJ INJ
0.5000 mL | INJECTION | Freq: Once | INTRAMUSCULAR | Status: AC
  Administered 2019-10-13: 0.5 mL via INTRAMUSCULAR
  Filled 2019-10-13: qty 0.5

## 2019-10-13 MED ORDER — FOLIC ACID 1 MG PO TABS
1.0000 mg | ORAL_TABLET | Freq: Every day | ORAL | Status: DC
  Administered 2019-10-14: 1 mg via ORAL
  Filled 2019-10-13: qty 1

## 2019-10-13 MED ORDER — FUROSEMIDE 10 MG/ML IJ SOLN
60.0000 mg | Freq: Once | INTRAMUSCULAR | Status: AC
  Administered 2019-10-13: 60 mg via INTRAVENOUS
  Filled 2019-10-13: qty 6

## 2019-10-13 MED ORDER — OXYCODONE-ACETAMINOPHEN 5-325 MG PO TABS
1.0000 | ORAL_TABLET | Freq: Once | ORAL | Status: AC
  Administered 2019-10-13: 1 via ORAL
  Filled 2019-10-13: qty 1

## 2019-10-13 MED ORDER — LORAZEPAM 2 MG/ML IJ SOLN
1.0000 mg | INTRAMUSCULAR | Status: DC | PRN
  Administered 2019-10-13 (×2): 2 mg via INTRAVENOUS
  Filled 2019-10-13 (×2): qty 1

## 2019-10-13 MED ORDER — THIAMINE HCL 100 MG PO TABS
100.0000 mg | ORAL_TABLET | Freq: Every day | ORAL | Status: DC
  Administered 2019-10-13 – 2019-10-14 (×2): 100 mg via ORAL
  Filled 2019-10-13 (×2): qty 1

## 2019-10-13 NOTE — Progress Notes (Signed)
  Echocardiogram 2D Echocardiogram has been performed.  [name] [name] 10/13/2019, 4:56 PM

## 2019-10-13 NOTE — Consult Note (Addendum)
NAME:  [name] [name], MRN:  [id number], DOB:  11/15/1970, LOS: 0 ADMISSION DATE:  10/12/2019, CONSULTATION DATE:  10/13/19 REFERRING MD:  Dwyane Dee  CHIEF COMPLAINT:  SOB   Brief History   [name] [name] is a 49 y.o. male who was admitted 8/9 with dyspnea and pleuritic chest pain felt to be 2/2 bronchitis. CTA and echo being ordered.  History of present illness   [name] [name] is a 48 y.o. male who has a PMH as outlined below.  He presented to [facility] ED 8/10 with dyspnea, chest pain, wheezing, productive cough x 4 - 5 days.  Chest pain mainly while coughing, relieved with rest.  Had tried using albuterol inhalers without much relief.  Workup in ED essentially negative.  He was admitted with presumptive diagnosis of bronchitis.  Has had similar episodes in the past, also attributed to bronchitis.  CXR did have mild vascular congestion.  He is an active smoker and does drink alcohol.  He is obese.  Denies any hx of prior VTE or malignancy.  No recent hemoptysis, LE edema, recent travel.  Over the past 1 week he feels that his dyspnea has worsened significantly to the point he can barely walk to the bathroom without becoming symptomatic.  This is apparently new for him.  He works as a Designer, industrial/product and states that when healthy, he is on his feet most of the day without problem. He has also noticed increased in "bubbly sputum" when he coughs.  He was unsure what to make of this so never informed anybody.  He also endorses orthopnea after being specifically asked.  Since admission, he has been hemodynamically stable.  He is on room air with sat 96%.  He does reportedly have a hx of OSA but is not compliant with CPAP therapy.   Past Medical History  has Benign neoplasm of descending colon; Morbid obesity ([place]); Anxiety; ADHD (attention deficit hyperactivity disorder); Hemorrhoids; GERD; OSA (obstructive sleep apnea); DEPENDENT EDEMA; Chronic knee pain; Essential hypertension; Hepatic steatosis;  History of colonic polyps; Diarrhea; Erectile dysfunction; COPD with acute exacerbation ([place]); Smoker; Chest pain; Osteoarthritis; Preventative health care; Cough; COVID-19 virus infection; MDD (major depressive disorder), single episode, moderate ([place]); Nausea and vomiting; Alcohol abuse; Acute bronchitis; and COPD exacerbation (HCC) on their problem list.  [place] [facility] Events   8/9 > admit.  Consults:  PCCM.  Procedures:  None.  Significant Diagnostic Tests:  CXR 8/9 > mild vascular congestion. CTA chest 8/10 > Echo 8/10 >   Micro Data:  COVID 8/9 > neg.  Antimicrobials:  Zithromax 8/9 >    Interim history/subjective:  Comfortable with rest but easily dyspneic with exertion.  Objective:  Blood pressure (!) 172/116, pulse (!) 102, temperature 97.7 F (36.5 C), temperature source Oral, resp. rate 18, SpO2 96 %.        Intake/Output Summary (Last 24 hours) at 10/13/2019 1454 Last data filed at 10/13/2019 0210 Gross per 24 hour  Intake 250 ml  Output --  Net 250 ml   There were no vitals filed for this visit.  Examination: General: Adult male, in NAD. Neuro: A&O x 3, no deficits. HEENT: [place]/AT. Sclerae anicteric.  EOMI. Cardiovascular: Tachy, regular, no M/R/G.  Lungs: Respirations even and unlabored.  CTA bilaterally, No W/R/R.  Abdomen: Obese.  BS x 4, soft, NT/ND.  Musculoskeletal: No gross deformities, no edema.  Skin: Intact, warm, no rashes.  Assessment & Plan:   Dyspnea - no clear etiology at this point; however, current working  diagnosis is bronchitis given his hx.  He does have dyspnea (especially exertional) with chest pain and does have RF's for VTE (obesity, smoking); however, it is reassuring that he has not had any hypoxia and is stable on room air (he has had persistent sinus tach since admission though). - Assess CTA chest for completeness sake and to ensure no PE. - Assess echo. - Continue empiric zithromax, solumedrol (drop to QD dosing),  pulmicort for now. - Bronchial hygiene.  Mild vascular congestion - feels he has had worsening orthopnea, DOE and "bubbly sputum" recently. - 40mg  lasix x 1. - Assess echo.  Tobacco dependence. - Tobacco cessation counseling.  Hx OSA (reportedly had sleep study some 10 - 15 years ago and is not compliant with CPAP due to discomfort and intolerance of mask). - Would recommend repeat outpatient sleep study and then follow up with pulmonary.   Rest per primary team.  Nothing further to add.  PCCM will sign off.  If above imaging reveals findings for which it is felt that pulmonary should be on board, please do call back.  Additionally, once pt is ready for discharge, please call our office at [phone] and have pt set up with one of our sleep physicians for outpatient follow up. Please call us back if we can be of any further assistance.   Labs   CBC: Recent Labs  Lab 10/12/19 1044 10/12/19 2318 10/13/19 0408  WBC 6.3 7.2 6.9  HGB 16.9 16.7 16.9  HCT 49.7 48.5 49.6  MCV 92.9 92.6 92.4  PLT 211 187 585   Basic Metabolic Panel: Recent Labs  Lab 10/12/19 1044 10/12/19 2318 10/13/19 0408  NA 139  --  136  K 4.0  --  3.8  CL 103  --  102  CO2 24  --  21*  GLUCOSE 110*  --  165*  BUN 11  --  11  CREATININE 0.95 1.09 0.97  CALCIUM 9.0  --  9.2   GFR: CrCl cannot be calculated (Unknown ideal weight.). Recent Labs  Lab 10/12/19 1044 10/12/19 2318 10/13/19 0408  WBC 6.3 7.2 6.9   Liver Function Tests: No results for input(s): AST, ALT, ALKPHOS, BILITOT, PROT, ALBUMIN in the last 168 hours. No results for input(s): LIPASE, AMYLASE in the last 168 hours. No results for input(s): AMMONIA in the last 168 hours. ABG    Component Value Date/Time   TCO2 26 11/07/2014 1023    Coagulation Profile: No results for input(s): INR, PROTIME in the last 168 hours. Cardiac Enzymes: No results for input(s): CKTOTAL, CKMB, CKMBINDEX, TROPONINI in the last 168  hours. HbA1C: Hgb A1c MFr Bld  Date/Time Value Ref Range Status  04/03/2018 11:49 AM 5.1 4.6 - 6.5 % Final    Comment:    Glycemic Control Guidelines for People with Diabetes:Non Diabetic:  <6%Goal of Therapy: <7%Additional Action Suggested:  >8%   12/21/2015 08:31 AM 5.0 4.6 - 6.5 % Final    Comment:    Glycemic Control Guidelines for People with Diabetes:Non Diabetic:  <6%Goal of Therapy: <7%Additional Action Suggested:  >8%    CBG: No results for input(s): GLUCAP in the last 168 hours.  Review of Systems:   All negative; except for those that are bolded, which indicate positives.  Constitutional: weight loss, weight gain, night sweats, fevers, chills, fatigue, weakness.  HEENT: headaches, sore throat, sneezing, nasal congestion, post nasal drip, difficulty swallowing, tooth/dental problems, visual complaints, visual changes, ear aches. Neuro: difficulty  with speech, weakness, numbness, ataxia. CV:  chest pain, orthopnea, PND, swelling in lower extremities, dizziness, palpitations, syncope.  Resp: cough, hemoptysis, dyspnea, wheezing, DOE. GI: heartburn, indigestion, abdominal pain, nausea, vomiting, diarrhea, constipation, change in bowel habits, loss of appetite, hematemesis, melena, hematochezia.  GU: dysuria, change in color of urine, urgency or frequency, flank pain, hematuria. MSK: joint pain or swelling, decreased range of motion. Psych: change in mood or affect, depression, anxiety, suicidal ideations, homicidal ideations. Skin: rash, itching, bruising.   Past medical history  He,  has a past medical history of Abdominal pain (07/31/2014), Abnormal CT scan, small bowel (12/28/2015), Acute bronchitis (04/09/2013), Acute conjunctivitis of both eyes (09/04/2017), Acute pharyngitis (04/13/2013), ADHD (attention deficit hyperactivity disorder) (10/21/2006), AKI (acute kidney injury) ([place]) (06/26/2017), Alternating constipation and diarrhea (10/12/2015), ANGER (10/31/2006), ANXIETY (11/14/2009),  Asthma, Benign neoplasm of descending colon (10/21/2006), Bronchitis, Calculus of gallbladder with acute cholecystitis without obstruction (02/15/2015), Chest pain (03/17/2018), Chest wall pain (02/10/2015), Cholelithiasis (02/13/2015), Chronic bronchitis ([place]), Complication of anesthesia, Conjunctivitis (10/18/2010), Dehydration (06/26/2017), DEPENDENT EDEMA (11/09/2008), Diarrhea (11/06/2016), Enteritis (12/21/2015), Erectile dysfunction (04/11/2017), Gallstone (02/10/2015), Gallstones, GASTROENTERITIS WITHOUT DEHYDRATION (05/15/2010), GERD (10/21/2006), GERD (gastroesophageal reflux disease), Hepatic steatosis (05/31/2015), History of colonic polyps (12/28/2015), Hypertension, Lumbar back sprain (01/28/2017), Mole of skin (05/28/2012), Morbid obesity ([place]) (07/30/2006), Nausea vomiting and diarrhea (07/31/2014), Obesity, OSA (obstructive sleep apnea) (07/30/2006), Partial small bowel obstruction ([place]) (12/14/2015), Right knee pain (12/01/2012), SBO (small bowel obstruction) ([place]) (06/26/2017), Skin lesion (11/07/2014), Skin tag (05/28/2012), Sleep apnea, Small bowel obstruction ([place]) (05/31/2015), Small bowel obstruction, partial ([place]) (12/13/2015), Smoker (09/04/2017), and Viral gastroenteritis (03/23/2016).   Surgical History    Past Surgical History:  Procedure Laterality Date  . CHOLECYSTECTOMY N/A 02/14/2015   Procedure: LAPAROSCOPIC CHOLECYSTECTOMY WITH INTRAOPERATIVE CHOLANGIOGRAM;  Surgeon: Coralie Keens, MD;  Location: [place];  Service: General;  Laterality: N/A;  . COLONOSCOPY  10/16/2005   Polyps (tubular adenoma 2007), external hemorrhoids  . COLONOSCOPY WITH PROPOFOL N/A 01/05/2016   Procedure: COLONOSCOPY WITH PROPOFOL;  Surgeon: Milus Banister, MD;  Location: [facility];  Service: Endoscopy;  Laterality: N/A;  . FINGER SURGERY Left    reconstructive - 2 surgeries on hand  . LAPAROSCOPIC GASTRIC SLEEVE RESECTION  10/2010    at [place]   reports that he quit smoking about 2 years ago. His  smoking use included cigarettes. He started smoking about 3 years ago. He has a 20.00 pack-year smoking history. His smokeless tobacco use includes snuff. He reports current alcohol use of about 13.0 standard drinks of alcohol per week. He reports that he does not use drugs.   Family history   His family history includes Breast cancer in his maternal grandmother and mother; CAD in his father; Cancer in his father; Cirrhosis in his maternal grandmother; Clotting disorder in his father; Diabetes Mellitus II in his father and mother; Ovarian cancer in his mother; Stroke in his father.   Allergies Allergies  Allergen Reactions  . Bee Venom Anaphylaxis  . Codeine Anaphylaxis    Tolerates dilaudid  . Hornet Venom Anaphylaxis  . Penicillins Hives and Other (See Comments)    Has patient had a PCN reaction causing immediate rash, facial/tongue/throat swelling, SOB or lightheadedness with hypotensionYES Has patient had a PCN reaction causing severe rash involving mucus membranes or skin necrosis: NO Has patient had a PCN reaction that required hospitalization NO Has patient had a PCN reaction occurring within the last 10 years: NO If all of the above answers are "  NO", then may proceed with Cephalosporin use.     Home meds  Prior to Admission medications   Medication Sig Start Date End Date Taking? Authorizing Provider  albuterol (ACCUNEB) 0.63 MG/3ML nebulizer solution USE 1 VIAL IN NEBULIZER EVERY 6 HOURS AS NEEDED FOR WHEEZING OR  SHORTNESS  OF  BREATH Patient taking differently: Take 1 ampule by nebulization every 6 (six) hours as needed for wheezing or shortness of breath.  07/02/19  Yes [name], NP  albuterol (PROAIR HFA) 108 (90 Base) MCG/ACT inhaler Inhale 2 puffs into the lungs every 6 (six) hours as needed for wheezing or shortness of breath. 03/02/19  Yes Ria Bush, MD  amLODipine (NORVASC) 10 MG tablet Take 1 tablet by mouth once daily Patient taking differently: Take 10  mg by mouth daily.  07/29/19  Yes [name], NP  budesonide-formoterol [facility]) 160-4.5 MCG/ACT inhaler Inhale 2 puffs into the lungs 2 (two) times daily. 08/06/19  Yes [name], NP  cetirizine (ZYRTEC) 10 MG tablet Take 10 mg by mouth daily.   Yes [provider]  cyclobenzaprine (FLEXERIL) 10 MG tablet Take 0.5 tablets (5 mg total) by mouth at bedtime as needed for muscle spasms (sedation caution). 02/03/19  Yes Tonia Ghent, MD  omeprazole (PRILOSEC) 40 MG capsule Take 1 capsule (40 mg total) by mouth daily. 09/12/19 10/12/19 Yes Carmon Sails J, PA-C  ondansetron (ZOFRAN ODT) 4 MG disintegrating tablet Take 1 tablet (4 mg total) by mouth every 8 (eight) hours as needed for nausea or vomiting. 08/31/19  Yes [name], NP  sildenafil (REVATIO) 20 MG tablet Take 2-5 tablets by mouth 30 minutes prior to intercourse. 04/11/17  Yes [name], NP  sucralfate (CARAFATE) 1 g tablet Take 1 tablet (1 g total) by mouth 4 (four) times daily -  with meals and at bedtime. 09/12/19 10/12/19 Yes Kinnie Feil, PA-C     Montey Hora, [facility] 10/13/2019, 2:54 PM

## 2019-10-13 NOTE — Progress Notes (Signed)
PROGRESS NOTE    Cline Draheim  UJW:[id number] DOB: 1971/02/14 DOA: 10/12/2019 PCP: [name], NP   Brief Narrative: [name] [name] is a 49 y.o. male with history of hypertension, ongoing tobacco and alcohol abuse presents to the ER because of shortness of breath with wheezing and productive cough for the last 4 to 5 days. Patient has some chest pain on coughing otherwise chest pain-free. Denies fever chills. Patient states he has been using his albuterol inhaler despite which patient is still short of breath.  In the ER on exam patient has bilateral expiratory wheeze with chest x-ray showing nothing,  acute Covid test was negative. EKG showed normal sinus rhythm. Troponins were negative. Labs are largely unremarkable. Patient is admitted for COPD with acute bronchitis,  started on IV antibiotics,  Solu-Medrol and breathing treatment.  Pulmonology consulted echocardiogram and CTA chest ordered to rule out PE.     Assessment & Plan:   Principal Problem:   Acute bronchitis Active Problems:   Morbid obesity (HCC)   GERD   OSA (obstructive sleep apnea)   Alcohol abuse   COPD exacerbation ([place])    1.COPD exacerbation with acute bronchitis - Continue DuoNeb nebulizer,  IV steroids,  Pulmicort,  Zithromax. He does not appear hypoxic on exam but his shortness of breath with talking and exertion. Pulmonology consulted,  recommended echo and CTA chest to rule out PE.  2. Hypertension : Continue amlodipine. 1. Tobacco and alcohol abuse advised about quitting and on CIWA. 2. History of GERD on PPI. 3. History of sleep apnea.  Needs outpatient sleep studies.   DVT prophylaxis: Lovenox. Code Status: Full code. Family Communication: Discussed with patient. Disposition Plan: Home. Consults called: None. Patient is from:  home Anticipated discharge:  home Anticipated discharge home in 1 to 2 days.   Consultants:   Pulmonology  Procedures: Antimicrobials:    Anti-infectives (From admission, onward)   Start     Dose/Rate Route Frequency Ordered Stop   10/12/19 2330  azithromycin (ZITHROMAX) 500 mg in sodium chloride 0.9 % 250 mL IVPB     Discontinue     500 mg 250 mL/hr over 60 Minutes Intravenous Every 24 hours 10/12/19 2320        Subjective: Patient was seen and examined at bedside.  Patient has been upset, has not received proper attention while in the ED hallway.  Patient denies any chest pain but appears short of breath while talking.  Objective: Vitals:   10/13/19 0616 10/13/19 1052 10/13/19 1131 10/13/19 1307  BP: (!) 172/95 (!) 171/94 (!) 192/119 (!) 172/116  Pulse: 100 (!) 108 (!) 110 (!) 102  Resp: 16 18 18 18   Temp:   98.7 F (37.1 C) 97.7 F (36.5 C)  TempSrc:   Oral Oral  SpO2: 95% 97% 96% 96%    Intake/Output Summary (Last 24 hours) at 10/13/2019 1453 Last data filed at 10/13/2019 0210 Gross per 24 hour  Intake 250 ml  Output --  Net 250 ml   There were no vitals filed for this visit.  Examination:  General exam: Appears calm and comfortable  Respiratory system: Clear to auscultation. Respiratory effort normal. Cardiovascular system: S1 & S2 heard, RRR. No JVD, murmurs, rubs, gallops or clicks. No pedal edema. Gastrointestinal system: Abdomen is nondistended, soft and nontender. No organomegaly or masses felt. Normal bowel sounds heard. Central nervous system: Alert and oriented. No focal neurological deficits. Extremities: Symmetric 5 x 5 power. Skin: No rashes, lesions or ulcers Psychiatry: Judgement  and insight appear normal. Mood & affect appropriate.     Data Reviewed: I have personally reviewed following labs and imaging studies  CBC: Recent Labs  Lab 10/12/19 1044 10/12/19 2318 10/13/19 0408  WBC 6.3 7.2 6.9  HGB 16.9 16.7 16.9  HCT 49.7 48.5 49.6  MCV 92.9 92.6 92.4  PLT 211 187 956   Basic Metabolic Panel: Recent Labs  Lab 10/12/19 1044 10/12/19 2318 10/13/19 0408  NA 139  --  136   K 4.0  --  3.8  CL 103  --  102  CO2 24  --  21*  GLUCOSE 110*  --  165*  BUN 11  --  11  CREATININE 0.95 1.09 0.97  CALCIUM 9.0  --  9.2   GFR: CrCl cannot be calculated (Unknown ideal weight.). Liver Function Tests: No results for input(s): AST, ALT, ALKPHOS, BILITOT, PROT, ALBUMIN in the last 168 hours. No results for input(s): LIPASE, AMYLASE in the last 168 hours. No results for input(s): AMMONIA in the last 168 hours. Coagulation Profile: No results for input(s): INR, PROTIME in the last 168 hours. Cardiac Enzymes: No results for input(s): CKTOTAL, CKMB, CKMBINDEX, TROPONINI in the last 168 hours. BNP (last 3 results) Recent Labs    08/06/19 1116  PROBNP 50.0   HbA1C: No results for input(s): HGBA1C in the last 72 hours. CBG: No results for input(s): GLUCAP in the last 168 hours. Lipid Profile: No results for input(s): CHOL, HDL, LDLCALC, TRIG, CHOLHDL, LDLDIRECT in the last 72 hours. Thyroid Function Tests: No results for input(s): TSH, T4TOTAL, FREET4, T3FREE, THYROIDAB in the last 72 hours. Anemia Panel: No results for input(s): VITAMINB12, FOLATE, FERRITIN, TIBC, IRON, RETICCTPCT in the last 72 hours. Sepsis Labs: No results for input(s): PROCALCITON, LATICACIDVEN in the last 168 hours.  Recent Results (from the past 240 hour(s))  SARS Coronavirus 2 by RT PCR (hospital order, performed in [facility] hospital lab) Nasopharyngeal Nasopharyngeal Swab     Status: None   Collection Time: 10/12/19  5:43 PM   Specimen: Nasopharyngeal Swab  Result Value Ref Range Status   SARS Coronavirus 2 NEGATIVE NEGATIVE Final    Comment: (NOTE) SARS-CoV-2 target nucleic acids are NOT DETECTED.  The SARS-CoV-2 RNA is generally detectable in upper and lower respiratory specimens during the acute phase of infection. The lowest concentration of SARS-CoV-2 viral copies this assay can detect is 250 copies / mL. A negative result does not preclude SARS-CoV-2 infection and should  not be used as the sole basis for treatment or other patient management decisions.  A negative result may occur with improper specimen collection / handling, submission of specimen other than nasopharyngeal swab, presence of viral mutation(s) within the areas targeted by this assay, and inadequate number of viral copies (<250 copies / mL). A negative result must be combined with clinical observations, patient history, and epidemiological information.  Fact Sheet for Patients:   StrictlyIdeas.no  Fact Sheet for Healthcare Providers: BankingDealers.co.za  This test is not yet approved or  cleared by the [place] FDA and has been authorized for detection and/or diagnosis of SARS-CoV-2 by FDA under an Emergency Use Authorization (EUA).  This EUA will remain in effect (meaning this test can be used) for the duration of the COVID-19 declaration under Section 564(b)(1) of the Act, 21 U.S.C. section 360bbb-3(b)(1), unless the authorization is terminated or revoked sooner.  Performed at [facility], [place] [address] [place], [place] [address]  Radiology Studies: DG Chest 2 View  Result Date: 10/12/2019 CLINICAL DATA:  Shortness of breath, chest pain EXAM: CHEST - 2 VIEW COMPARISON:  07/16/2019 FINDINGS: The heart size and mediastinal contours are within normal limits. Both lungs are clear. The visualized skeletal structures are unremarkable. IMPRESSION: No active cardiopulmonary disease. Electronically Signed   By: Kathreen Devoid   On: 10/12/2019 10:58        Scheduled Meds: . AeroChamber Plus Flo-Vu Large  1 each Other Once  . amLODipine  10 mg Oral Daily  . budesonide (PULMICORT) nebulizer solution  0.25 mg Nebulization BID  . enoxaparin (LOVENOX) injection  40 mg Subcutaneous Q12H  . folic acid  1 mg Oral Daily  . ipratropium-albuterol  3 mL Nebulization BID  . [START ON 10/14/2019] methylPREDNISolone (SOLU-MEDROL)  injection  40 mg Intravenous Daily  . multivitamin with minerals  1 tablet Oral Daily  . pantoprazole  40 mg Oral Daily  . sodium chloride flush  3 mL Intravenous Once  . sucralfate  1 g Oral TID WC & HS  . thiamine  100 mg Oral Daily   Or  . thiamine  100 mg Intravenous Daily   Continuous Infusions: . azithromycin Stopped (10/13/19 0210)     LOS: 0 days    Time spent: 25 mins.    Shawna Clamp, MD [facility]   If 7PM-7AM, please contact night-coverage

## 2019-10-14 DIAGNOSIS — J441 Chronic obstructive pulmonary disease with (acute) exacerbation: Principal | ICD-10-CM

## 2019-10-14 MED ORDER — METHYLPREDNISOLONE 4 MG PO TBPK
ORAL_TABLET | ORAL | 0 refills | Status: DC
Start: 2019-10-14 — End: 2020-01-07

## 2019-10-14 MED ORDER — AZITHROMYCIN 500 MG PO TABS
500.0000 mg | ORAL_TABLET | Freq: Every day | ORAL | 0 refills | Status: AC
Start: 2019-10-14 — End: 2019-10-17

## 2019-10-14 MED ORDER — AZITHROMYCIN 250 MG PO TABS: 500.0000 mg | ORAL_TABLET | Freq: Every day | ORAL | Status: DC

## 2019-10-14 MED ORDER — TRELEGY ELLIPTA 100-62.5-25 MCG/INH IN AEPB: 1.0000 | INHALATION_SPRAY | Freq: Every day | RESPIRATORY_TRACT | 0 refills | Status: DC

## 2019-10-14 MED ORDER — GUAIFENESIN ER 600 MG PO TB12
600.0000 mg | ORAL_TABLET | Freq: Two times a day (BID) | ORAL | 0 refills | Status: DC
Start: 2019-10-14 — End: 2020-01-07

## 2019-10-14 NOTE — TOC Initial Note (Signed)
Transition of Care [facility]) - Initial/Assessment Note    Patient Details  Name: [name] [name] MRN: [id number] Date of Birth: 02-18-1971  Transition of Care [facility] CM/SW Contact:    Alexander Mt, LCSW Phone Number: 10/14/2019, 11:01 AM  Clinical Narrative:                 Acknowledging MD consult for substance use assessment. CSW called and spoke with pt as he has discharge orders and this writer is off the floor. CSW introduced self, role, reason for call. Confirmed home address and PCP. Offered to make f/u appointment, pt states he will arrange himself, and if he changes his mind then I can arrange for him prior to d/c. Pt states no concerns regarding any substance use and declines and additional resources/needs. Pt has ride home today. He would like to speak with someone regarding his Emergency Department stay, deferred this to RN leadership and made Rod Holler (RN AD) and Ave Filter (bedside RN) aware. No additional needs at this time  Expected Discharge Plan: Home/Self Care Barriers to Discharge: No Barriers Identified   Patient Goals and CMS Choice Patient states their goals for this hospitalization and ongoing recovery are:: return home, talk to someone about his ED experience   Choice offered to / list presented to : Patient  Expected Discharge Plan and Services Expected Discharge Plan: Home/Self Care In-house Referral: Clinical Social Work Discharge Planning Services: CM Consult Post Acute Care Choice: NA Living arrangements for the past 2 months: Single Family Home Expected Discharge Date: 10/14/19                 Prior Living Arrangements/Services Living arrangements for the past 2 months: Single Family Home Lives with:: Self Patient language and need for interpreter reviewed:: Yes (no needs) Do you feel safe going back to the place where you live?: Yes      Need for Family Participation in Patient Care: Yes (Comment) (assistance as needed) Care giver support system in place?:  Yes (comment) (family and friends)   Criminal Activity/Legal Involvement Pertinent to Current Situation/Hospitalization: No - Comment as needed  Activities of Daily Living Home Assistive Devices/Equipment: None ADL Screening (condition at time of admission) Patient's cognitive ability adequate to safely complete daily activities?: Yes Is the patient deaf or have difficulty hearing?: No Does the patient have difficulty seeing, even when wearing glasses/contacts?: No Does the patient have difficulty concentrating, remembering, or making decisions?: No Patient able to express need for assistance with ADLs?: Yes Does the patient have difficulty dressing or bathing?: No Independently performs ADLs?: Yes (appropriate for developmental age) Does the patient have difficulty walking or climbing stairs?: No Weakness of Legs: Both Weakness of Arms/Hands: Both  Permission Sought/Granted Permission sought to share information with : PCP Permission granted to share information with : Yes, Verbal Permission Granted     Permission granted to share info w AGENCY: PCP- pt will call to arrange his own appointment   Emotional Assessment Appearance:: Other (Comment Required Attitude/Demeanor/Rapport: Engaged, Other (comment) Affect (typically observed): Accepting, Adaptable, Appropriate, Other (comment) Orientation: : Oriented to Self, Oriented to Place, Oriented to  Time, Oriented to Situation Alcohol / Substance Use: Alcohol Use Psych Involvement: No (comment)  Admission diagnosis:  Acute bronchitis [J20.9] COPD exacerbation ([place]) [J44.1] Patient Active Problem List   Diagnosis Date Noted  . COPD exacerbation ([place]) 10/13/2019  . Acute bronchitis 10/12/2019  . Alcohol abuse 09/17/2019  . Nausea and vomiting 07/03/2019  . MDD (major depressive disorder), single  episode, moderate ([place]) 06/25/2019  . COVID-19 virus infection 03/27/2019  . Cough 05/29/2018  . Osteoarthritis 04/03/2018  .  Preventative health care 04/03/2018  . Chest pain 03/17/2018  . Smoker 09/04/2017  . COPD with acute exacerbation ([place]) 06/26/2017  . Erectile dysfunction 04/11/2017  . Diarrhea 11/06/2016  . History of colonic polyps 12/28/2015  . Hepatic steatosis 05/31/2015  . Essential hypertension 07/31/2014  . Chronic knee pain 12/01/2012  . Anxiety 11/14/2009  . DEPENDENT EDEMA 11/09/2008  . 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  . ADHD (attention deficit hyperactivity disorder) 10/21/2006  . GERD 10/21/2006  . Morbid obesity ([place]) 07/30/2006  . OSA (obstructive sleep apnea) 07/30/2006  . Hemorrhoids 10/03/2005   PCP:  [name], NP Pharmacy:   [facility] [address] [place] [address] Phone: [phone] Fax: [phone]  Readmission Risk Interventions Readmission Risk Prevention Plan 10/14/2019  Post Dischage Appt Complete  Medication Screening Complete  Transportation Screening Complete  Some recent data might be hidden

## 2019-10-14 NOTE — Progress Notes (Signed)
CM provided him a coupon to assist with the cost of his Trelegy Ellipta inhaler. Pt happy with assistance in the cost of $30/ month.

## 2019-10-14 NOTE — Discharge Summary (Signed)
Physician Discharge Summary  [name] [name] VQM:[id number] DOB: 09-19-1970 DOA: 10/12/2019  PCP: [name] [name], NP  Admit date: 10/12/2019  Discharge date: 10/14/2019  Admitted From: Home Disposition:  Home  Recommendations for Outpatient Follow-up:  Follow up with PCP in 1-2 weeks. Please obtain BMP/CBC in one week. Advised to take Zithromax for 3 more days to complete 5-day course for bronchitis. Advised to take Medrol pack , take it as directed.   Advised to follow-up with pulmonology for outpatient PFTs and sleep studies.  Home Health: No Equipment/Devices: No  Discharge Condition: Stable CODE STATUS:Full code Diet recommendation: Heart Healthy   Brief [facility] course: [name] [name] is a 49 y.o. male with history of hypertension, ongoing tobacco and alcohol abuse presents to the ER because of shortness of breath with wheezing and productive cough for the last 4 to 5 days. Patient has some chest pain on coughing otherwise chest pain-free. Denies fever chills. Patient states he has been using his albuterol inhaler despite which patient is still short of breath.  Patient reports he called his primary care physician and was told to go to the emergency room. In the ER on exam patient has bilateral expiratory wheeze with chest x-ray showing nothing,  acute Covid test was negative. EKG showed normal sinus rhythm. Troponins were negative. Labs are largely unremarkable. Patient was admitted for COPD exacerbation with acute bronchitis,  started on IV antibiotics,  Solu-Medrol and breathing treatment.  Pulmonology consulted , Echocardiogram shows LVEF of 55 to 60%.  Normal left ventricle function. CTA chest ruled out pulmonary embolism, and pneumonia.  Pulmonology recommended outpatient PFTs and sleep studies, Patient is cleared from pulmonology to be discharged.  Patient feels better,  Patient is being discharged on Trelegy 1 puff daily.  Patient was explained in detail about the  medication compliance and follow-ups.  Advised to take Zithromax for 3 more days to complete 5-day treatment.  He was managed for below problems.  Discharge Diagnoses:  Principal Problem:   Acute bronchitis Active Problems:   Morbid obesity (HCC)   GERD   OSA (obstructive sleep apnea)   Alcohol abuse   COPD exacerbation ([place])  1.COPD exacerbation with acute bronchitis - Continue DuoNeb nebulizer,  IV steroids,  Pulmicort,  Zithromax. He does not appear hypoxic on exam but his shortness of breath with talking and exertion. Pulmonology consulted,  recommended echo and CTA chest to rule out PE. Echocardiogram unremarkable EF: 55 to 60%.  CTA chest ruled out PE and pneumonia.    2. Hypertension : Continue amlodipine.  Tobacco and alcohol abuse advised about quitting and on CIWA.  History of GERD on PPI. History of sleep apnea.  Needs outpatient sleep studies.    Discharge Instructions  Discharge Instructions     Call MD for:  difficulty breathing, headache or visual disturbances   Complete by: As directed    Call MD for:  persistant dizziness or light-headedness   Complete by: As directed    Call MD for:  persistant nausea and vomiting   Complete by: As directed    Call MD for:  severe uncontrolled pain   Complete by: As directed    Call MD for:  temperature >100.4   Complete by: As directed    Diet - low sodium heart healthy   Complete by: As directed    Diet Carb Modified   Complete by: As directed    Discharge instructions   Complete by: As directed    Advised to follow-up  with primary care physician in 1 week. Advised to take Zithromax for 3 more days to complete 5-day course for bronchitis Advised to take Medrol pack take it as directed.   Advised to follow-up with pulmonology for outpatient PFTs and sleep studies.   Increase activity slowly   Complete by: As directed       Allergies as of 10/14/2019       Reactions   Bee Venom Anaphylaxis   Codeine  Anaphylaxis   Tolerates dilaudid   Hornet Venom Anaphylaxis   Penicillins Hives, Other (See Comments)   Has patient had a PCN reaction causing immediate rash, facial/tongue/throat swelling, SOB or lightheadedness with hypotensionYES Has patient had a PCN reaction causing severe rash involving mucus membranes or skin necrosis: NO Has patient had a PCN reaction that required hospitalization NO Has patient had a PCN reaction occurring within the last 10 years: NO If all of the above answers are "NO", then may proceed with Cephalosporin use.        Medication List     TAKE these medications    albuterol 108 (90 Base) MCG/ACT inhaler Commonly known as: ProAir HFA Inhale 2 puffs into the lungs every 6 (six) hours as needed for wheezing or shortness of breath. What changed: Another medication with the same name was changed. Make sure you understand how and when to take each.   albuterol 0.63 MG/3ML nebulizer solution Commonly known as: ACCUNEB USE 1 VIAL IN NEBULIZER EVERY 6 HOURS AS NEEDED FOR WHEEZING OR  SHORTNESS  OF  BREATH What changed:  how much to take how to take this when to take this reasons to take this additional instructions   amLODipine 10 MG tablet Commonly known as: NORVASC Take 1 tablet by mouth once daily   azithromycin 500 MG tablet Commonly known as: Zithromax Take 1 tablet (500 mg total) by mouth daily for 3 days. Take 1 tablet daily for 3 days.   budesonide-formoterol 160-4.5 MCG/ACT inhaler Commonly known as: SYMBICORT Inhale 2 puffs into the lungs 2 (two) times daily.   cetirizine 10 MG tablet Commonly known as: ZYRTEC Take 10 mg by mouth daily.   cyclobenzaprine 10 MG tablet Commonly known as: FLEXERIL Take 0.5 tablets (5 mg total) by mouth at bedtime as needed for muscle spasms (sedation caution).   guaiFENesin 600 MG 12 hr tablet Commonly known as: Mucinex Take 1 tablet (600 mg total) by mouth 2 (two) times daily.   methylPREDNISolone 4 MG  Tbpk tablet Commonly known as: MEDROL DOSEPAK Take it as directed.   omeprazole 40 MG capsule Commonly known as: PRILOSEC Take 1 capsule (40 mg total) by mouth daily.   ondansetron 4 MG disintegrating tablet Commonly known as: Zofran ODT Take 1 tablet (4 mg total) by mouth every 8 (eight) hours as needed for nausea or vomiting.   sildenafil 20 MG tablet Commonly known as: REVATIO Take 2-5 tablets by mouth 30 minutes prior to intercourse.   sucralfate 1 g tablet Commonly known as: CARAFATE Take 1 tablet (1 g total) by mouth 4 (four) times daily -  with meals and at bedtime.   Trelegy Ellipta 100-62.5-25 MCG/INH Aepb Generic drug: Fluticasone-Umeclidin-Vilant Inhale 1 puff into the lungs daily.        Follow-up Information     [name] [name], NP Follow up in 1 week(s).   Specialty: Internal Medicine Contact information: [address] [place] [address] [phone]         [facility] Follow up in 2 week(s).   Specialty: Pulmonology Why: Outpatient PFTs and sleep studies Contact information: [place] [address] [phone]               Allergies  Allergen Reactions   Bee Venom Anaphylaxis   Codeine Anaphylaxis    Tolerates dilaudid   Hornet Venom Anaphylaxis   Penicillins Hives and Other (See Comments)    Has patient had a PCN reaction causing immediate rash, facial/tongue/throat swelling, SOB or lightheadedness with hypotensionYES Has patient had a PCN reaction causing severe rash involving mucus membranes or skin necrosis: NO Has patient had a PCN reaction that required hospitalization NO Has patient had a PCN reaction occurring within the last 10 years: NO If all of the above answers are "NO", then may proceed with Cephalosporin use.    Consultations: Pulmonology   Procedures/Studies: DG Chest 2 View  Result Date: 10/12/2019 CLINICAL DATA:  Shortness of breath, chest pain EXAM:  CHEST - 2 VIEW COMPARISON:  07/16/2019 FINDINGS: The heart size and mediastinal contours are within normal limits. Both lungs are clear. The visualized skeletal structures are unremarkable. IMPRESSION: No active cardiopulmonary disease. Electronically Signed   By: Kathreen Devoid   On: 10/12/2019 10:58   CT ANGIO CHEST PE W OR WO CONTRAST  Result Date: 10/13/2019 CLINICAL DATA:  50 year old male with chest pain and shortness of breath. EXAM: CT ANGIOGRAPHY CHEST WITH CONTRAST TECHNIQUE: Multidetector CT imaging of the chest was performed using the standard protocol during bolus administration of intravenous contrast. Multiplanar CT image reconstructions and MIPs were obtained to evaluate the vascular anatomy. CONTRAST:  150 mL OMNIPAQUE IOHEXOL 350 MG/ML SOLN (2 different contrast bolus attempts 75 mL each) COMPARISON:  Chest CTA 02/16/2015. CT Abdomen and Pelvis 09/12/2019 chest radiographs 10/12/2019. FINDINGS: Cardiovascular: Inadequate initial contrast bolus timing in the pulmonary arterial tree. The patient was brought back for repeat contrast dose an imaging attempt with adequate contrast timing on the 2nd set of images. No central, hilar, or upper lobe pulmonary artery filling defect. The proximal and segmental lower lobe branches also appear within normal limits, but the distal lower lobe branches are obscured by motion on both imaging attempts. Negative visible aorta. No cardiomegaly or pericardial effusion. No calcified coronary artery atherosclerosis is evident. Mediastinum/Nodes: Negative. Lungs/Pleura: Major airways are patent. Mild respiratory motion in the bilateral lower lobe basal segments. But both lungs appear clear. No pleural effusion. Upper Abdomen: Chronic postoperative changes along the greater curve of the stomach. Absent gallbladder. Negative visible liver, spleen, pancreas, adrenal glands, and kidneys. Musculoskeletal: No acute osseous abnormality identified. Mild chronic gynecomastia.  Review of the MIP images confirms the above findings. IMPRESSION: 1. Two CTA attempts, with adequate pulmonary artery contrast timing the second time. No pulmonary artery embolus is identified, although on both attempts the distal lower lobe arteries are degraded by motion. 2. Both lungs appear clear.  No acute finding in the chest. Electronically Signed   By: Genevie Ann M.D.   On: 10/13/2019 21:45   ECHOCARDIOGRAM COMPLETE  Result Date: 10/13/2019    ECHOCARDIOGRAM REPORT   Patient Name:   [name] [name] Date of Exam: 10/13/2019 Medical Rec #:  [id number]        Height:       74.0 in Accession #:    [id number]       Weight:       390.7 lb Date of Birth:  1970-09-08  BSA:          2.889 m Patient Age:    79 years         BP:           172/116 mmHg Patient Gender: M                HR:           102 bpm. Exam Location:  Inpatient Procedure: 2D Echo Indications:    dyspnea 786.09  History:        Patient has no prior history of Echocardiogram examinations.                 Risk Factors:Current Smoker and Sleep Apnea.  Sonographer:    Johny Chess Referring Phys: [id number] RAHUL P DESAI  Sonographer Comments: Suboptimal parasternal window and Technically difficult study due to poor echo windows. Image acquisition challenging due to patient body habitus. IMPRESSIONS  1. Left ventricular ejection fraction, by estimation, is 55 to 60%. The left ventricle has normal function. The left ventricle has no regional wall motion abnormalities. Indeterminate diastolic filling due to E-A fusion.  2. Right ventricular systolic function is mildly reduced. The right ventricular size is normal. Tricuspid regurgitation signal is inadequate for assessing PA pressure.  3. The mitral valve is normal in structure. No evidence of mitral valve regurgitation. No evidence of mitral stenosis.  4. The aortic valve was not well visualized. Aortic valve regurgitation is not visualized. No aortic stenosis is present.  Conclusion(s)/Recommendation(s): Technically limited study. In particular, the parasternal and subcostal views provide very little in the way of interpretable information. Valve structure is very poorly seen, but Doppler shows no evidence of hemodynamically significant abnormalities. Diastolic function and filling pressures cannot be assessed due to tachycardia and E-A fusion. Overall, left ventricular function appears normal, although analysis of regional wall motion is less confident. FINDINGS  Left Ventricle: Left ventricular ejection fraction, by estimation, is 55 to 60%. The left ventricle has normal function. The left ventricle has no regional wall motion abnormalities. The left ventricular internal cavity size was normal in size. Indeterminate diastolic filling due to E-A fusion. Right Ventricle: The right ventricular size is normal. No increase in right ventricular wall thickness. Right ventricular systolic function is mildly reduced. Tricuspid regurgitation signal is inadequate for assessing PA pressure. Left Atrium: Left atrial size was normal in size. Right Atrium: Right atrial size was normal in size. Pericardium: There is no evidence of pericardial effusion. Mitral Valve: The mitral valve is normal in structure. No evidence of mitral valve regurgitation. No evidence of mitral valve stenosis. Tricuspid Valve: The tricuspid valve is not well visualized. Tricuspid valve regurgitation is not demonstrated. Aortic Valve: The aortic valve was not well visualized. Aortic valve regurgitation is not visualized. No aortic stenosis is present. Pulmonic Valve: The pulmonic valve was not well visualized. Aorta: The aortic root and ascending aorta are structurally normal, with no evidence of dilitation. Venous: The inferior vena cava was not well visualized. IAS/Shunts: The interatrial septum was not well visualized.  LEFT VENTRICLE PLAX 2D LVOT diam:     2.70 cm  Diastology LV SV:         121      LV e' lateral:  11.00 cm/s LV SV Index:   42 LVOT Area:     5.73 cm  RIGHT VENTRICLE RV S prime:     10.30 cm/s TAPSE (M-mode): 2.2 cm LEFT ATRIUM  Index       RIGHT ATRIUM           Index LA Vol (A2C):   45.2 ml 15.65 ml/m RA Area:     12.10 cm LA Vol (A4C):   37.3 ml 12.91 ml/m RA Volume:   26.30 ml  9.10 ml/m LA Biplane Vol: 41.6 ml 14.40 ml/m  AORTIC VALVE LVOT Vmax:   124.00 cm/s LVOT Vmean:  79.800 cm/s LVOT VTI:    0.212 m  AORTA Ao Root diam: 3.70 cm Ao Asc diam:  3.30 cm  SHUNTS Systemic VTI:  0.21 m Systemic Diam: 2.70 cm Dani Gobble Croitoru MD Electronically signed by Sanda Klein MD Signature Date/Time: 10/13/2019/5:02:15 PM    Final    Echocardiogram, CTA chest   Subjective: Patient was seen and examined at bedside.  No overnight events.  Patient reports feeling much better.  Patient denies any further difficulty breathing.  Patient is still complains of coughing.  Patient wants to be discharged home.  Discharge Exam: Vitals:   10/14/19 0550 10/14/19 0834  BP: (!) 160/104   Pulse: 85   Resp: 18   Temp: 98.3 F (36.8 C)   SpO2: 98% 98%   Vitals:   10/13/19 2014 10/14/19 0024 10/14/19 0550 10/14/19 0834  BP:  (!) 147/104 (!) 160/104   Pulse:  96 85   Resp:  18 18   Temp:  97.8 F (36.6 C) 98.3 F (36.8 C)   TempSrc:  Oral Oral   SpO2: 99% 97% 98% 98%  Weight:      Height:        General: Pt is alert, awake, not in acute distress Cardiovascular: RRR, S1/S2 +, no rubs, no gallops Respiratory: CTA bilaterally, no wheezing, no rhonchi Abdominal: Soft, NT, ND, bowel sounds + Extremities: no edema, no cyanosis    The results of significant diagnostics from this hospitalization (including imaging, microbiology, ancillary and laboratory) are listed below for reference.     Microbiology: Recent Results (from the past 240 hour(s))  SARS Coronavirus 2 by RT PCR (hospital order, performed in [facility] hospital lab) Nasopharyngeal Nasopharyngeal Swab     Status: None    Collection Time: 10/12/19  5:43 PM   Specimen: Nasopharyngeal Swab  Result Value Ref Range Status   SARS Coronavirus 2 NEGATIVE NEGATIVE Final    Comment: (NOTE) SARS-CoV-2 target nucleic acids are NOT DETECTED.  The SARS-CoV-2 RNA is generally detectable in upper and lower respiratory specimens during the acute phase of infection. The lowest concentration of SARS-CoV-2 viral copies this assay can detect is 250 copies / mL. A negative result does not preclude SARS-CoV-2 infection and should not be used as the sole basis for treatment or other patient management decisions.  A negative result may occur with improper specimen collection / handling, submission of specimen other than nasopharyngeal swab, presence of viral mutation(s) within the areas targeted by this assay, and inadequate number of viral copies (<250 copies / mL). A negative result must be combined with clinical observations, patient history, and epidemiological information.  Fact Sheet for Patients:   StrictlyIdeas.no  Fact Sheet for Healthcare Providers: BankingDealers.co.za  This test is not yet approved or  cleared by the [place] FDA and has been authorized for detection and/or diagnosis of SARS-CoV-2 by FDA under an Emergency Use Authorization (EUA).  This EUA will remain in effect (meaning this test can be used) for the duration of the COVID-19 declaration under Section 564(b)(1) of the Act, 21 U.S.C.  section 360bbb-3(b)(1), unless the authorization is terminated or revoked sooner.  Performed at [facility], [place] [address] [place], [place] [address]      Labs: BNP (last 3 results) No results for input(s): BNP in the last 8760 hours. Basic Metabolic Panel: Recent Labs  Lab 10/12/19 1044 10/12/19 2318 10/13/19 0408  NA 139  --  136  K 4.0  --  3.8  CL 103  --  102  CO2 24  --  21*  GLUCOSE 110*  --  165*  BUN 11  --  11  CREATININE 0.95  1.09 0.97  CALCIUM 9.0  --  9.2   Liver Function Tests: No results for input(s): AST, ALT, ALKPHOS, BILITOT, PROT, ALBUMIN in the last 168 hours. No results for input(s): LIPASE, AMYLASE in the last 168 hours. No results for input(s): AMMONIA in the last 168 hours. CBC: Recent Labs  Lab 10/12/19 1044 10/12/19 2318 10/13/19 0408  WBC 6.3 7.2 6.9  HGB 16.9 16.7 16.9  HCT 49.7 48.5 49.6  MCV 92.9 92.6 92.4  PLT 211 187 210   Cardiac Enzymes: No results for input(s): CKTOTAL, CKMB, CKMBINDEX, TROPONINI in the last 168 hours. BNP: Invalid input(s): POCBNP CBG: No results for input(s): GLUCAP in the last 168 hours. D-Dimer No results for input(s): DDIMER in the last 72 hours. Hgb A1c No results for input(s): HGBA1C in the last 72 hours. Lipid Profile No results for input(s): CHOL, HDL, LDLCALC, TRIG, CHOLHDL, LDLDIRECT in the last 72 hours. Thyroid function studies No results for input(s): TSH, T4TOTAL, T3FREE, THYROIDAB in the last 72 hours.  Invalid input(s): FREET3 Anemia work up No results for input(s): VITAMINB12, FOLATE, FERRITIN, TIBC, IRON, RETICCTPCT in the last 72 hours. Urinalysis    Component Value Date/Time   COLORURINE YELLOW 09/12/2019 0413   APPEARANCEUR CLEAR 09/12/2019 0413   LABSPEC 1.015 09/12/2019 0413   PHURINE 5.0 09/12/2019 0413   GLUCOSEU NEGATIVE 09/12/2019 0413   HGBUR NEGATIVE 09/12/2019 0413   BILIRUBINUR NEGATIVE 09/12/2019 0413   BILIRUBINUR Neg 11/06/2016 1018   KETONESUR NEGATIVE 09/12/2019 0413   PROTEINUR NEGATIVE 09/12/2019 0413   UROBILINOGEN 0.2 11/06/2016 1018   UROBILINOGEN 1.0 05/18/2008 1235   NITRITE NEGATIVE 09/12/2019 0413   LEUKOCYTESUR NEGATIVE 09/12/2019 0413   Sepsis Labs Invalid input(s): PROCALCITONIN,  WBC,  LACTICIDVEN Microbiology Recent Results (from the past 240 hour(s))  SARS Coronavirus 2 by RT PCR (hospital order, performed in [facility] hospital lab) Nasopharyngeal Nasopharyngeal Swab     Status: None    Collection Time: 10/12/19  5:43 PM   Specimen: Nasopharyngeal Swab  Result Value Ref Range Status   SARS Coronavirus 2 NEGATIVE NEGATIVE Final    Comment: (NOTE) SARS-CoV-2 target nucleic acids are NOT DETECTED.  The SARS-CoV-2 RNA is generally detectable in upper and lower respiratory specimens during the acute phase of infection. The lowest concentration of SARS-CoV-2 viral copies this assay can detect is 250 copies / mL. A negative result does not preclude SARS-CoV-2 infection and should not be used as the sole basis for treatment or other patient management decisions.  A negative result may occur with improper specimen collection / handling, submission of specimen other than nasopharyngeal swab, presence of viral mutation(s) within the areas targeted by this assay, and inadequate number of viral copies (<250 copies / mL). A negative result must be combined with clinical observations, patient history, and epidemiological information.  Fact Sheet for Patients:   StrictlyIdeas.no  Fact Sheet for Healthcare Providers:  BankingDealers.co.za  This test is not yet approved or  cleared by the [place] and has been authorized for detection and/or diagnosis of SARS-CoV-2 by FDA under an Emergency Use Authorization (EUA).  This EUA will remain in effect (meaning this test can be used) for the duration of the COVID-19 declaration under Section 564(b)(1) of the Act, 21 U.S.C. section 360bbb-3(b)(1), unless the authorization is terminated or revoked sooner.  Performed at [facility], [place] [address] [place], [place] [address]      Time coordinating discharge: Over 30 minutes  SIGNED:   Shawna Clamp, MD  [facility] 10/14/2019, 10:45 AM   If 7PM-7AM, please contact night-coverage [email or web address]

## 2019-10-14 NOTE — Progress Notes (Signed)
D/c instruction reviewed with patient. All questions answered at this time. [employer]ar[employer] manager provided.

## 2019-10-14 NOTE — Discharge Instructions (Signed)
Advised to follow-up with primary care physician in 1 week. Advised to take Zithromax for 3 more days to complete 5-day course for bronchitis Advised to take Medrol pack take it as directed.   Advised to follow-up with pulmonology for outpatient PFTs and sleep studies.

## 2019-11-02 ENCOUNTER — Encounter: Payer: Self-pay | Admitting: Family Medicine

## 2019-11-02 ENCOUNTER — Telehealth (INDEPENDENT_AMBULATORY_CARE_PROVIDER_SITE_OTHER): Payer: BC Managed Care – PPO | Admitting: Family Medicine

## 2019-11-02 DIAGNOSIS — R112 Nausea with vomiting, unspecified: Secondary | ICD-10-CM | POA: Diagnosis not present

## 2019-11-02 DIAGNOSIS — F1010 Alcohol abuse, uncomplicated: Secondary | ICD-10-CM | POA: Diagnosis not present

## 2019-11-02 DIAGNOSIS — R197 Diarrhea, unspecified: Secondary | ICD-10-CM | POA: Diagnosis not present

## 2019-11-02 MED ORDER — ONDANSETRON 4 MG PO TBDP: 4.0000 mg | ORAL_TABLET | Freq: Three times a day (TID) | ORAL | 0 refills | Status: DC | PRN

## 2019-11-02 NOTE — Progress Notes (Signed)
Virtual Visit via Video Note  I connected with [name] [name] on 11/02/19 at 11:00 AM EDT by a video enabled telemedicine application and verified that I am speaking with the correct person using two identifiers.  Location: Patient: In his home Provider: Franklin [name] Persons participating in virtual visit: Patient and provider   I discussed the limitations of evaluation and management by telemedicine and the availability of in person appointments. The patient expressed understanding and agreed to proceed.  History of Present Illness: Chief Complaint  Patient presents with  . Diarrhea    upset stomach and nausea started Thursday   This is a 49 year old male who presents today for virtual visit for above chief complaint.  Patient reports that 5 days ago he had episodes of nausea with diarrhea starting the following day.  He reports this happens to him frequently when he drinks too much alcohol.  He reports that he has a history of gastritis and has been taking Carafate as needed as well as omeprazole 40 mg.  He has not had any alcohol to drink in the last 4 to 5 days.  He reports that he typically has 8-10 beers and several shots of liquor.  He reports fewer bowel movements in the last 24 hours but says that he had between 6 and 7 loose stools yesterday.  He has had 2 doses of ondansetron with relief.  No vomiting, no fever.  Able to take liquids and foods without difficulty.  No history of pancreatitis.  He requests that he needs a return to work note and that is the primary reason for his visit today.  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 Observations/Objective: Patient is alert and answers questions appropriately.  He appears to be supine in bed.  Visible skin is unremarkable.  Respirations are even and unlabored without increased work of breathing.  No audible wheeze or witnessed cough.  Mood and affect are appropriate. There were no  vitals taken for this visit. Wt Readings from Last 3 Encounters:  10/13/19 (!) 390 lb 11.2 oz (177.2 kg)  09/12/19 (!) 407 lb 13.6 oz (185 kg)  08/31/19 (!) 376 lb (170.6 kg)   BP Readings from Last 3 Encounters:  10/14/19 (!) 141/95  09/12/19 (!) 143/115  08/06/19 130/84    Assessment and Plan: 1. Diarrhea of presumed infectious origin -Per patient, this is improving.  Discussed taking Imodium which he said he could not take due to history of gastric sleeve. -Ondansetron for nausea should help with loose stools as well. -Discussed follow-up precautions if he does not continue to improve with fewer bowel movements each day -Encouraged adequate hydration -Patient is not vaccinated against COVID-19 and I encouraged him to be tested prior to resuming work.  He reports that he will be to get tested with a rapid test tomorrow prior to starting his shift.  2. Non-intractable vomiting with nausea, unspecified vomiting type - ondansetron (ZOFRAN ODT) 4 MG disintegrating tablet; Take 1 tablet (4 mg total) by mouth every 8 (eight) hours as needed for nausea or vomiting.  Dispense: 15 tablet; Refill: 0  3. Alcohol abuse -Discussed recommendations for alcohol for men as 2 drinks per day maximum and encouraged patient to continue to abstain from alcohol while feeling poorly and to consume no more than recommended amount on a daily basis  -Encouraged him to consider getting vaccinated against COVID-19  [name] Reamer, FNP-BC  [facility]  at [facility], [place]  11/02/2019 11:26 AM   Follow Up Instructions:    I discussed the assessment and treatment plan with the patient. The patient was provided an opportunity to ask questions and all were answered. The patient agreed with the plan and demonstrated an understanding of the instructions.   The patient was advised to call back or seek an in-person evaluation if the symptoms worsen or if the condition fails to  improve as anticipated.   Elby Beck, FNP

## 2019-11-05 ENCOUNTER — Encounter: Payer: Self-pay | Admitting: Family Medicine

## 2019-11-05 NOTE — Progress Notes (Signed)
Will you please email work excuse to patient? He is unable to print at home.

## 2019-11-07 ENCOUNTER — Other Ambulatory Visit: Payer: Self-pay | Admitting: Primary Care

## 2019-11-07 DIAGNOSIS — K219 Gastro-esophageal reflux disease without esophagitis: Secondary | ICD-10-CM

## 2019-11-10 NOTE — Telephone Encounter (Signed)
Pt called the office to add to this note. He was out Saturday, Sunday, Monday, and Tuesday. He will return to work tomorrow. Had a neg Covid test Friday.

## 2019-11-11 ENCOUNTER — Encounter: Payer: Self-pay | Admitting: Family Medicine

## 2019-11-11 NOTE — Telephone Encounter (Signed)
This has been emailed to patient.

## 2019-11-11 NOTE — Telephone Encounter (Signed)
Will you please email his work note to him ?

## 2019-11-23 ENCOUNTER — Other Ambulatory Visit: Payer: Self-pay

## 2019-11-23 ENCOUNTER — Ambulatory Visit (INDEPENDENT_AMBULATORY_CARE_PROVIDER_SITE_OTHER): Payer: BC Managed Care – PPO | Admitting: Family Medicine

## 2019-11-23 ENCOUNTER — Encounter: Payer: Self-pay | Admitting: Family Medicine

## 2019-11-23 VITALS — BP 140/90 | HR 101 | Temp 96.7°F | Wt 377.5 lb

## 2019-11-23 DIAGNOSIS — S86819A Strain of other muscle(s) and tendon(s) at lower leg level, unspecified leg, initial encounter: Secondary | ICD-10-CM | POA: Insufficient documentation

## 2019-11-23 DIAGNOSIS — S86812A Strain of other muscle(s) and tendon(s) at lower leg level, left leg, initial encounter: Secondary | ICD-10-CM | POA: Diagnosis not present

## 2019-11-23 NOTE — Assessment & Plan Note (Signed)
Advised continued rest for up to 1 week, but patient wanted to return to work sooner, so agreed for return in 2 days. Ok for rare NSAIDs and to walk on incline and wear compression sleeve. Return if not improved in 2-4 weeks.

## 2019-11-23 NOTE — Patient Instructions (Signed)
Calf strain/injury - rest - anti-inflammatory medication - when feeling better, walking and walking up a slight incline can be helpful  If not improvement return to see Dr. Lorelei Pont in 2-4 weeks
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
## 2019-11-26 ENCOUNTER — Encounter: Payer: Self-pay | Admitting: Family Medicine

## 2019-11-30 ENCOUNTER — Other Ambulatory Visit: Payer: BC Managed Care – PPO

## 2019-11-30 DIAGNOSIS — Z20822 Contact with and (suspected) exposure to covid-19: Secondary | ICD-10-CM

## 2019-12-01 ENCOUNTER — Encounter: Payer: Self-pay | Admitting: Family Medicine

## 2019-12-02 LAB — NOVEL CORONAVIRUS, NAA

## 2019-12-11 ENCOUNTER — Encounter: Payer: Self-pay | Admitting: Family Medicine

## 2020-01-07 ENCOUNTER — Other Ambulatory Visit: Payer: Self-pay

## 2020-01-07 ENCOUNTER — Ambulatory Visit: Payer: BC Managed Care – PPO | Admitting: Internal Medicine

## 2020-01-07 ENCOUNTER — Encounter: Payer: Self-pay | Admitting: Internal Medicine

## 2020-01-07 VITALS — BP 160/92 | HR 101 | Temp 98.3°F | Resp 16 | Ht 74.0 in | Wt 388.0 lb

## 2020-01-07 DIAGNOSIS — R112 Nausea with vomiting, unspecified: Secondary | ICD-10-CM | POA: Diagnosis not present

## 2020-01-07 DIAGNOSIS — R5383 Other fatigue: Secondary | ICD-10-CM | POA: Diagnosis not present

## 2020-01-07 DIAGNOSIS — R197 Diarrhea, unspecified: Secondary | ICD-10-CM

## 2020-01-07 DIAGNOSIS — R519 Headache, unspecified: Secondary | ICD-10-CM

## 2020-01-07 DIAGNOSIS — R52 Pain, unspecified: Secondary | ICD-10-CM

## 2020-01-07 DIAGNOSIS — R0981 Nasal congestion: Secondary | ICD-10-CM | POA: Diagnosis not present

## 2020-01-07 DIAGNOSIS — R059 Cough, unspecified: Secondary | ICD-10-CM

## 2020-01-07 LAB — POCT INFLUENZA A/B
Influenza A, POC: NEGATIVE
Influenza B, POC: NEGATIVE

## 2020-01-07 MED ORDER — ONDANSETRON HCL 4 MG PO TABS: 4.0000 mg | ORAL_TABLET | Freq: Three times a day (TID) | ORAL | 0 refills | Status: DC | PRN

## 2020-01-07 NOTE — Progress Notes (Signed)
Subjective:    Patient ID: [name] [name], male    DOB: [birth year], 49 y.o.   MRN: [id number]  HPI  Pt presents to the clinic today with c/o fatigue, a slight headache, nasal congestion, cough, SOB, nausea, vomiting and diarrhea. This started last night. He reports the headache is not that bad. He is not blowing anything out of his nose. The cough is mostly nonproductive. He denies runny nose, ear pain, sore throat or chest pain. He denies fever or chills but reports body aches. He can still taste and smell. He ate Beef Stew for dinner last night. 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 He did not get a Covid vaccine but does get tested weekly. He has not taken any meds OTC for this. He reports he had Covid in Feburary 2021.  Review of Systems      Past Medical History:  Diagnosis Date  . Abdominal pain 07/31/2014  . Abnormal CT scan, small bowel 12/28/2015  . Acute bronchitis 04/09/2013  . Acute conjunctivitis of both eyes 09/04/2017  . Acute pharyngitis 04/13/2013  . ADHD (attention deficit hyperactivity disorder) 10/21/2006   Qualifier: Diagnosis of  By: Maxie Better FNP, Rosalita Levan   . AKI (acute kidney injury) ([place]) 06/26/2017  . Alternating constipation and diarrhea 10/12/2015  . ANGER 10/31/2006   Qualifier: Diagnosis of  By: Maxie Better FNP, Rosalita Levan   . ANXIETY 11/14/2009   Qualifier: Diagnosis of  By: Deborra Medina MD, Tanja Port    . Asthma   . 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 Bronchitis   . Calculus of gallbladder with acute cholecystitis without obstruction 02/15/2015  . Chest pain 03/17/2018  . Chest wall pain 02/10/2015  . Cholelithiasis 02/13/2015  . Chronic bronchitis ([place])   . Complication of anesthesia    usually wake up in the middle of procedure or surgery  . Conjunctivitis 10/18/2010  . Dehydration 06/26/2017  . DEPENDENT EDEMA 11/09/2008   Qualifier: Diagnosis of  By: Maxie Better FNP,  Rosalita Levan   . Diarrhea 11/06/2016  . Enteritis 12/21/2015  . Erectile dysfunction 04/11/2017  . Gallstone 02/10/2015  . Gallstones   . GASTROENTERITIS WITHOUT DEHYDRATION 05/15/2010   Qualifier: Diagnosis of  By: Deborra Medina MD, Tanja Port    . GERD 10/21/2006   Qualifier: Diagnosis of  By: Maxie Better FNP, Rosalita Levan   . GERD (gastroesophageal reflux disease)   . Hepatic steatosis 05/31/2015  . History of colonic polyps 12/28/2015  . Hypertension   . Lumbar back sprain 01/28/2017  . Mole of skin 05/28/2012  . Morbid obesity ([place]) 07/30/2006   Qualifier: Diagnosis of  By: Maxie Better FNP, Rosalita Levan   . Nausea vomiting and diarrhea 07/31/2014  . Obesity   . OSA (obstructive sleep apnea) 07/30/2006   Qualifier: Diagnosis of  By: Maxie Better FNP, Rosalita Levan   . Partial small bowel obstruction ([place]) 12/14/2015  . Right knee pain 12/01/2012  . SBO (small bowel obstruction) ([place]) 06/26/2017  . Skin lesion 11/07/2014  . Skin tag 05/28/2012  . Sleep apnea    "suppose to wear mask but I don't" (05/31/2015)  . Small bowel obstruction ([place]) 05/31/2015  . Small bowel obstruction, partial ([place]) 12/13/2015  . Smoker 09/04/2017  . Viral gastroenteritis 03/23/2016    Current Outpatient Medications  Medication Sig Dispense Refill  . albuterol (ACCUNEB) 0.63 MG/3ML nebulizer solution USE 1 VIAL IN NEBULIZER EVERY 6 HOURS AS NEEDED  FOR WHEEZING OR  SHORTNESS  OF  BREATH (Patient taking differently: Take 1 ampule by nebulization every 6 (six) hours as needed for wheezing or shortness of breath. ) 90 mL 0  . albuterol (PROAIR HFA) 108 (90 Base) MCG/ACT inhaler Inhale 2 puffs into the lungs every 6 (six) hours as needed for wheezing or shortness of breath. 18 g 3  . amLODipine (NORVASC) 10 MG tablet Take 1 tablet by mouth once daily (Patient taking differently: Take 10 mg by mouth daily. ) 90 tablet 1  . budesonide-formoterol (SYMBICORT) 160-4.5 MCG/ACT inhaler Inhale 2 puffs into the lungs 2 (two) times daily. 1  Inhaler 0  . cetirizine (ZYRTEC) 10 MG tablet Take 10 mg by mouth daily.    . Fluticasone-Umeclidin-Vilant (TRELEGY ELLIPTA) 100-62.5-25 MCG/INH AEPB Inhale 1 puff into the lungs daily. 1 each 0  . omeprazole (PRILOSEC) 20 MG capsule Take 1 capsule by mouth once daily 90 capsule 0  . ondansetron (ZOFRAN ODT) 4 MG disintegrating tablet Take 1 tablet (4 mg total) by mouth every 8 (eight) hours as needed for nausea or vomiting. 15 tablet 0  . sildenafil (REVATIO) 20 MG tablet Take 2-5 tablets by mouth 30 minutes prior to intercourse. 50 tablet 0  . ondansetron (ZOFRAN) 4 MG tablet Take 1 tablet (4 mg total) by mouth every 8 (eight) hours as needed. 20 tablet 0  . sucralfate (CARAFATE) 1 g tablet Take 1 tablet (1 g total) by mouth 4 (four) times daily -  with meals and at bedtime. 120 tablet 0   No current facility-administered medications for this visit.   Facility-Administered Medications Ordered in Other Visits  Medication Dose Route Frequency Provider Last Rate Last Admin  . technetium tetrofosmin (TC-MYOVIEW) injection 09.6 millicurie  04.5 millicurie Intravenous Once PRN Nahser, Wonda Cheng, MD        Allergies  Allergen Reactions  . Bee Venom Anaphylaxis  . Codeine Anaphylaxis    Tolerates dilaudid  . Hornet Venom Anaphylaxis  . Penicillins Hives and Other (See Comments)    Has patient had a PCN reaction causing immediate rash, facial/tongue/throat swelling, SOB or lightheadedness with hypotensionYES Has patient had a PCN reaction causing severe rash involving mucus membranes or skin necrosis: NO Has patient had a PCN reaction that required hospitalization NO Has patient had a PCN reaction occurring within the last 10 years: NO If all of the above answers are "NO", then may proceed with Cephalosporin use.    Family History  Problem Relation Age of Onset  . Diabetes Mellitus II Mother   . Breast cancer Mother   . Ovarian cancer Mother   . Diabetes Mellitus II Father   . CAD Father    . Stroke Father   . Cancer Father        ?  . Clotting disorder Father        blood clots in brain with surgery  . Cirrhosis Maternal Grandmother   . Breast cancer Maternal Grandmother     Social History   Socioeconomic History  . Marital status: Legally Separated    Spouse name: Not on file  . Number of children: 1  . Years of education: Not on file  . Highest education level: Not on file  Occupational History  . Occupation: Publishing rights manager: [employer]  Tobacco Use  . Smoking status: Former Smoker    Packs/day: 1.00    Years: 20.00    Pack years: 20.00    Types: Cigarettes  Start date: 10/03/2016    Quit date: 10/03/2017    Years since quitting: 2.2  . Smokeless tobacco: Current User    Types: Snuff  Vaping Use  . Vaping Use: Never used  Substance and Sexual Activity  . Alcohol use: Yes    Alcohol/week: 13.0 standard drinks    Types: 13 Shots of liquor per week    Comment: "a couple of shots a day"  . Drug use: No  . Sexual activity: Yes  Other Topics Concern  . Not on file  Social History Narrative  . Not on file   Social Determinants of Health   Financial Resource Strain:   . Difficulty of Paying Living Expenses: Not on file  Food Insecurity:   . Worried About Charity fundraiser in the Last Year: Not on file  . Ran Out of Food in the Last Year: Not on file  Transportation Needs:   . Lack of Transportation (Medical): Not on file  . Lack of Transportation (Non-Medical): Not on file  Physical Activity:   . Days of Exercise per Week: Not on file  . Minutes of Exercise per Session: Not on file  Stress:   . Feeling of Stress : Not on file  Social Connections:   . Frequency of Communication with Friends and Family: Not on file  . Frequency of Social Gatherings with Friends and Family: Not on file  . Attends Religious Services: Not on file  . Active Member of Clubs or Organizations: Not on file  . Attends Archivist Meetings:  Not on file  . Marital Status: Not on file  Intimate Partner Violence:   . Fear of Current or Ex-Partner: Not on file  . Emotionally Abused: Not on file  . Physically Abused: Not on file  . Sexually Abused: Not on file     Constitutional: Pt reports fatigue and malaise. Denies fever, headache or abrupt weight changes.  HEENT: Pt reports nasal congestion. Denies eye pain, eye redness, ear pain, ringing in the ears, wax buildup, runny nose, bloody nose, or sore throat. Respiratory: Pt reports cough and SOB. Denies difficulty breathing, or sputum production.   Cardiovascular: Denies chest pain, chest tightness, palpitations or swelling in the hands or feet.  Gastrointestinal: Pt reports nausea, vomiting and diarrhea. Denies abdominal pain, bloating, constipation, or blood in the stool.   No other specific complaints in a complete review of systems (except as listed in HPI above).  Objective:   Physical Exam   BP (!) 160/92 (BP Location: Right Arm, Patient Position: Sitting, Cuff Size: Large)   Pulse (!) 101   Temp 98.3 F (36.8 C) (Temporal)   Resp 16   Ht 6\' 2"  (1.88 m)   Wt (!) 388 lb (176 kg)   SpO2 96%   BMI 49.82 kg/m  Wt Readings from Last 3 Encounters:  01/07/20 (!) 388 lb (176 kg)  11/23/19 (!) 377 lb 8 oz (171.2 kg)  10/13/19 (!) 390 lb 11.2 oz (177.2 kg)    General: Appears his stated age, obese, in NAD. Skin: Warm, dry and intact. No rashesnoted. HEENT: Head: normal shape and size; Eyes: sclera white, no icterus, conjunctiva pink, PERRLA and EOMs intact;  Neck:  No adenopathy noted. Cardiovascular: Tachycardic. Pulmonary/Chest: Normal effort with diminished vesicular breath sounds. No respiratory distress. No wheezes, rales or ronchi noted.  Abdomen: Soft and nontender. Normal bowel sounds.  Neurological: Alert and oriented.    BMET    Component Value Date/Time  NA 136 10/13/2019 0408   K 3.8 10/13/2019 0408   CL 102 10/13/2019 0408   CO2 21 (L)  10/13/2019 0408   GLUCOSE 165 (H) 10/13/2019 0408   GLUCOSE 83 03/18/2006 0953   BUN 11 10/13/2019 0408   CREATININE 0.97 10/13/2019 0408   CALCIUM 9.2 10/13/2019 0408   GFRNONAA >60 10/13/2019 0408   GFRAA >60 10/13/2019 0408    Lipid Panel     Component Value Date/Time   CHOL 157 07/16/2019 1148   TRIG 190.0 (H) 07/16/2019 1148   TRIG 87 03/18/2006 0953   HDL 47.10 07/16/2019 1148   CHOLHDL 3 07/16/2019 1148   VLDL 38.0 07/16/2019 1148   LDLCALC 72 07/16/2019 1148    CBC    Component Value Date/Time   WBC 6.9 10/13/2019 0408   RBC 5.37 10/13/2019 0408   HGB 16.9 10/13/2019 0408   HCT 49.6 10/13/2019 0408   PLT 210 10/13/2019 0408   MCV 92.4 10/13/2019 0408   MCH 31.5 10/13/2019 0408   MCHC 34.1 10/13/2019 0408   RDW 11.9 10/13/2019 0408   LYMPHSABS 1.4 08/06/2019 1116   MONOABS 0.4 08/06/2019 1116   EOSABS 0.1 08/06/2019 1116   BASOSABS 0.0 08/06/2019 1116    Hgb A1C Lab Results  Component Value Date   HGBA1C 5.1 04/03/2018           Assessment & Plan:   Acute Headache, Nasal Congestion, Cough, SOB, N/V/D, Body Aches:  DDX include viral illness, Covid, flu Covid swab today Flu swab today Discussed symptomatic care: rest and fluids Discussed self quarantine, masking, social distancing and frequent handwashing Encouraged Flonase for congestion and headache He can take Delsym as needed for cough Continue Symbicort, Trelegy and Albuterol RX for Zofran 4 mg TID prn for nausea Avoid antidiarrheals  We will call you with the results of your flu/covid test, return precautions discussed Webb Silversmith, NP This visit occurred during the SARS-CoV-2 public health emergency.  Safety protocols were in place, including screening questions prior to the visit, additional usage of staff PPE, and extensive cleaning of exam room while observing appropriate contact time as indicated for disinfecting solutions.

## 2020-01-07 NOTE — Patient Instructions (Signed)
COVID-19 COVID-19 is a respiratory infection that is caused by a virus called severe acute respiratory syndrome coronavirus 2 (SARS-CoV-2). The disease is also known as coronavirus disease or novel coronavirus. In some people, the virus may not cause any symptoms. In others, it may cause a serious infection. The infection can get worse quickly and can lead to complications, such as:  Pneumonia, or infection of the lungs.  Acute respiratory distress syndrome or ARDS. This is a condition in which fluid build-up in the lungs prevents the lungs from filling with air and passing oxygen into the blood.  Acute respiratory failure. This is a condition in which there is not enough oxygen passing from the lungs to the body or when carbon dioxide is not passing from the lungs out of the body.  Sepsis or septic shock. This is a serious bodily reaction to an infection.  Blood clotting problems.  Secondary infections due to bacteria or fungus.  Organ failure. This is when your body's organs stop working. The virus that causes COVID-19 is contagious. This means that it can spread from person to person through droplets from coughs and sneezes (respiratory secretions). What are the causes? This illness is caused by a virus. You may catch the virus by:  Breathing in droplets from an infected person. Droplets can be spread by a person breathing, speaking, singing, coughing, or sneezing.  Touching something, like a table or a doorknob, that was exposed to the virus (contaminated) and then touching your mouth, nose, or eyes. What increases the risk? Risk for infection You are more likely to be infected with this virus if you:  Are within 6 feet (2 meters) of a person with COVID-19.  Provide care for or live with a person who is infected with COVID-19.  Spend time in crowded indoor spaces or live in shared housing. Risk for serious illness You are more likely to become seriously ill from the virus if you:   Are 50 years of age or older. The higher your age, the more you are at risk for serious illness.  Live in a nursing home or long-term care facility.  Have cancer.  Have a long-term (chronic) disease such as: ? Chronic lung disease, including chronic obstructive pulmonary disease or asthma. ? A long-term disease that lowers your body's ability to fight infection (immunocompromised). ? Heart disease, including heart failure, a condition in which the arteries that lead to the heart become narrow or blocked (coronary artery disease), a disease which makes the heart muscle thick, weak, or stiff (cardiomyopathy). ? Diabetes. ? Chronic kidney disease. ? Sickle cell disease, a condition in which red blood cells have an abnormal "sickle" shape. ? Liver disease.  Are obese. What are the signs or symptoms? Symptoms of this condition can range from mild to severe. Symptoms may appear any time from 2 to 14 days after being exposed to the virus. They include:  A fever or chills.  A cough.  Difficulty breathing.  Headaches, body aches, or muscle aches.  Runny or stuffy (congested) nose.  A sore throat.  New loss of taste or smell. Some people may also have stomach problems, such as nausea, vomiting, or diarrhea. Other people may not have any symptoms of COVID-19. How is this diagnosed? This condition may be diagnosed based on:  Your signs and symptoms, especially if: ? You live in an area with a COVID-19 outbreak. ? You recently traveled to or from an area where the virus is common. ? You   provide care for or live with a person who was diagnosed with COVID-19. ? You were exposed to a person who was diagnosed with COVID-19.  A physical exam.  Lab tests, which may include: ? Taking a sample of fluid from the back of your nose and throat (nasopharyngeal fluid), your nose, or your throat using a swab. ? A sample of mucus from your lungs (sputum). ? Blood tests.  Imaging tests, which  may include, X-rays, CT scan, or ultrasound. How is this treated? At present, there is no medicine to treat COVID-19. Medicines that treat other diseases are being used on a trial basis to see if they are effective against COVID-19. Your health care provider will talk with you about ways to treat your symptoms. For most people, the infection is mild and can be managed at home with rest, fluids, and over-the-counter medicines. Treatment for a serious infection usually takes places in a hospital intensive care unit (ICU). It may include one or more of the following treatments. These treatments are given until your symptoms improve.  Receiving fluids and medicines through an IV.  Supplemental oxygen. Extra oxygen is given through a tube in the nose, a face mask, or a hood.  Positioning you to lie on your stomach (prone position). This makes it easier for oxygen to get into the lungs.  Continuous positive airway pressure (CPAP) or bi-level positive airway pressure (BPAP) machine. This treatment uses mild air pressure to keep the airways open. A tube that is connected to a motor delivers oxygen to the body.  Ventilator. This treatment moves air into and out of the lungs by using a tube that is placed in your windpipe.  Tracheostomy. This is a procedure to create a hole in the neck so that a breathing tube can be inserted.  Extracorporeal membrane oxygenation (ECMO). This procedure gives the lungs a chance to recover by taking over the functions of the heart and lungs. It supplies oxygen to the body and removes carbon dioxide. Follow these instructions at home: Lifestyle  If you are sick, stay home except to get medical care. Your health care provider will tell you how long to stay home. Call your health care provider before you go for medical care.  Rest at home as told by your health care provider.  Do not use any products that contain nicotine or tobacco, such as cigarettes, e-cigarettes, and  chewing tobacco. If you need help quitting, ask your health care provider.  Return to your normal activities as told by your health care provider. Ask your health care provider what activities are safe for you. General instructions  Take over-the-counter and prescription medicines only as told by your health care provider.  Drink enough fluid to keep your urine pale yellow.  Keep all follow-up visits as told by your health care provider. This is important. How is this prevented?  There is no vaccine to help prevent COVID-19 infection. However, there are steps you can take to protect yourself and others from this virus. To protect yourself:   Do not travel to areas where COVID-19 is a risk. The areas where COVID-19 is reported change often. To identify high-risk areas and travel restrictions, check the CDC travel website: wwwnc.cdc.gov/travel/notices  If you live in, or must travel to, an area where COVID-19 is a risk, take precautions to avoid infection. ? Stay away from people who are sick. ? Wash your hands often with soap and water for 20 seconds. If soap and water   are not available, use an alcohol-based hand sanitizer. ? Avoid touching your mouth, face, eyes, or nose. ? Avoid going out in public, follow guidance from your state and local health authorities. ? If you must go out in public, wear a cloth face covering or face mask. Make sure your mask covers your nose and mouth. ? Avoid crowded indoor spaces. Stay at least 6 feet (2 meters) away from others. ? Disinfect objects and surfaces that are frequently touched every day. This may include:  Counters and tables.  Doorknobs and light switches.  Sinks and faucets.  Electronics, such as phones, remote controls, keyboards, computers, and tablets. To protect others: If you have symptoms of COVID-19, take steps to prevent the virus from spreading to others.  If you think you have a COVID-19 infection, contact your health care  provider right away. Tell your health care team that you think you may have a COVID-19 infection.  Stay home. Leave your house only to seek medical care. Do not use public transport.  Do not travel while you are sick.  Wash your hands often with soap and water for 20 seconds. If soap and water are not available, use alcohol-based hand sanitizer.  Stay away from other members of your household. Let healthy household members care for children and pets, if possible. If you have to care for children or pets, wash your hands often and wear a mask. If possible, stay in your own room, separate from others. Use a different bathroom.  Make sure that all people in your household wash their hands well and often.  Cough or sneeze into a tissue or your sleeve or elbow. Do not cough or sneeze into your hand or into the air.  Wear a cloth face covering or face mask. Make sure your mask covers your nose and mouth. Where to find more information  Centers for Disease Control and Prevention: www.cdc.gov/coronavirus/2019-ncov/index.html  World Health Organization: www.who.int/health-topics/coronavirus Contact a health care provider if:  You live in or have traveled to an area where COVID-19 is a risk and you have symptoms of the infection.  You have had contact with someone who has COVID-19 and you have symptoms of the infection. Get help right away if:  You have trouble breathing.  You have pain or pressure in your chest.  You have confusion.  You have bluish lips and fingernails.  You have difficulty waking from sleep.  You have symptoms that get worse. These symptoms may represent a serious problem that is an emergency. Do not wait to see if the symptoms will go away. Get medical help right away. Call your local emergency services (911 in the [place] Do not drive yourself to the hospital. Let the emergency medical personnel know if you think you have COVID-19. Summary  COVID-19 is a  respiratory infection that is caused by a virus. It is also known as coronavirus disease or novel coronavirus. It can cause serious infections, such as pneumonia, acute respiratory distress syndrome, acute respiratory failure, or sepsis.  The virus that causes COVID-19 is contagious. This means that it can spread from person to person through droplets from breathing, speaking, singing, coughing, or sneezing.  You are more likely to develop a serious illness if you are 50 years of age or older, have a weak immune system, live in a nursing home, or have chronic disease.  There is no medicine to treat COVID-19. Your health care provider will talk with you about ways to treat your symptoms.    Take steps to protect yourself and others from infection. Wash your hands often and disinfect objects and surfaces that are frequently touched every day. Stay away from people who are sick and wear a mask if you are sick. This information is not intended to replace advice given to you by your health care provider. Make sure you discuss any questions you have with your health care provider. Document Revised: 12/19/2018 Document Reviewed: 03/27/2018 Elsevier Patient Education  2020 Elsevier Inc.  

## 2020-01-08 LAB — NOVEL CORONAVIRUS, NAA: SARS-CoV-2, NAA: NOT DETECTED

## 2020-01-08 LAB — SARS-COV-2, NAA 2 DAY TAT

## 2020-01-09 ENCOUNTER — Encounter: Payer: Self-pay | Admitting: Internal Medicine

## 2020-01-11 ENCOUNTER — Encounter: Payer: Self-pay | Admitting: Internal Medicine

## 2020-01-12 ENCOUNTER — Telehealth: Payer: Self-pay | Admitting: Primary Care

## 2020-01-12 NOTE — Telephone Encounter (Signed)
See previous email message

## 2020-01-12 NOTE — Telephone Encounter (Signed)
Pt called in wanted to know about getting written out of work until Thursday , he is starting to feel better but he is not at 100%. Wanted to know if he needed to come back in.  Please advise

## 2020-01-19 ENCOUNTER — Encounter: Payer: Self-pay | Admitting: Primary Care

## 2020-01-19 ENCOUNTER — Ambulatory Visit: Payer: BC Managed Care – PPO | Admitting: Primary Care

## 2020-01-19 ENCOUNTER — Other Ambulatory Visit: Payer: Self-pay

## 2020-01-19 VITALS — BP 154/88 | HR 100 | Temp 98.1°F | Ht 74.0 in | Wt 397.0 lb

## 2020-01-19 DIAGNOSIS — F331 Major depressive disorder, recurrent, moderate: Secondary | ICD-10-CM

## 2020-01-19 DIAGNOSIS — J449 Chronic obstructive pulmonary disease, unspecified: Secondary | ICD-10-CM | POA: Diagnosis not present

## 2020-01-19 DIAGNOSIS — I10 Essential (primary) hypertension: Secondary | ICD-10-CM

## 2020-01-19 DIAGNOSIS — F1010 Alcohol abuse, uncomplicated: Secondary | ICD-10-CM

## 2020-01-19 DIAGNOSIS — F411 Generalized anxiety disorder: Secondary | ICD-10-CM | POA: Diagnosis not present

## 2020-01-19 MED ORDER — ESCITALOPRAM OXALATE 10 MG PO TABS: 10.0000 mg | ORAL_TABLET | Freq: Every day | ORAL | 1 refills | Status: DC

## 2020-01-19 MED ORDER — TRELEGY ELLIPTA 100-62.5-25 MCG/INH IN AEPB: 1.0000 | INHALATION_SPRAY | Freq: Every day | RESPIRATORY_TRACT | 3 refills | Status: DC

## 2020-01-19 NOTE — Assessment & Plan Note (Signed)
He has become aware of his heavy alcohol intake and is working on cutting back.  Discussed potential side effects of alcohol abuse with SSRI treatment.

## 2020-01-19 NOTE — Assessment & Plan Note (Signed)
Chronic with GAD-7 score 20 today.  Suspect that his nausea while on citalopram was secondary to heavy alcohol use in combination with SSRI.  Continue therapy sessions.  Prescription for Lexapro 10 mg sent to pharmacy.  Discussed to refrain from heavy alcohol use while taking. Patient is to take 1/2 tablet daily for 8 days, then advance to 1 full tablet thereafter. We discussed possible side effects of headache, GI upset, drowsiness, and SI/HI. If thoughts of SI/HI develop, we discussed to present to the emergency immediately. Patient verbalized understanding.   Follow up in 6 weeks for re-evaluation.

## 2020-01-19 NOTE — Patient Instructions (Signed)
Use the Treligy inhaler daily.  Start escitalopram (Lexapro) 10 mg once daily for anxiety and depression. Start by taking 1/2 tablet daily for about one week, then increase to 1 full tablet thereafter.  Continue to meet with your therapist.   Please schedule a follow up visit for 6 weeks for follow up of anxiety/depression.  It was a pleasure to see you today!

## 2020-01-19 NOTE — Assessment & Plan Note (Signed)
Above goal in the office today on amlodipine 10 mg alone.  Discussed the need to work on blood pressure control.  He is motivated to work on lifestyle changes and would like a chance to lose weight.  We discussed the dangers of uncontrolled hypertension, he verbalized understanding.  We will plan to see him back in 6 weeks for follow-up of anxiety and depression, we will also recheck blood pressure at that time.

## 2020-01-19 NOTE — Assessment & Plan Note (Signed)
Did well with Trelegy Ellipta inhaler, has been out for the last month.  Refills provided today.

## 2020-01-19 NOTE — Assessment & Plan Note (Signed)
Chronic with PHQ-9 score 17 today.  Suspect that his nausea while on citalopram was secondary to heavy alcohol use in combination with SSRI.  Continue therapy sessions.  Prescription for Lexapro 10 mg sent to pharmacy.  Discussed to refrain from heavy alcohol use while taking. Patient is to take 1/2 tablet daily for 8 days, then advance to 1 full tablet thereafter. We discussed possible side effects of headache, GI upset, drowsiness, and SI/HI. If thoughts of SI/HI develop, we discussed to present to the emergency immediately. Patient verbalized understanding.   Follow up in 6 weeks for re-evaluation.

## 2020-01-19 NOTE — Progress Notes (Signed)
Subjective:    Patient ID: [name] [name], male    DOB: [birth year], 49 y.o.   MRN: [id number]  HPI  This visit occurred during the SARS-CoV-2 public health emergency.  Safety protocols were in place, including screening questions prior to the visit, additional usage of staff PPE, and extensive cleaning of exam room while observing appropriate contact time as indicated for disinfecting solutions.   [name] [name] is a 49 year old male with a history of hypertension, COPD, OSA, GERD, anxiety, ADHD who presents today to discuss mental health.  He is also needing a refill of his inhaler.  He is currently seeing a therapist for anxiety and heavy alcohol use, has been seeing her for the last month. He's had three sessions total, overall feels this has been beneficial. His therapist has recommended that he go on medication for anxiety and depression.  Symptoms include feeling anxious, restlessness, fatigue, little motivation to do anything. Symptoms initially began several years ago since the passing of his parents. He's also having marital problems which has caused stress. He is aware of his heavy alcohol intake, is drinking socially and will drink 10 to 12 ounces of liquor and 4-5 beers at a time.  He has become aware of his heavy alcohol intake and has been cutting back.  GAD 7 score of 20 and PHQ 9 score of 17 today. He was once on citalopram which caused nausea, but he's not sure if the citalopram caused nausea or if it was the combination of alcohol and citalopram as he was drinking heavily at the time.   BP Readings from Last 3 Encounters:  01/19/20 (!) 154/88  01/07/20 (!) 160/92  11/23/19 140/90     Review of Systems  Constitutional: Positive for fatigue.  Gastrointestinal: Negative for abdominal pain and nausea.  Psychiatric/Behavioral: The patient is nervous/anxious.        See HPI.       Past Medical History:  Diagnosis Date   Abdominal pain 07/31/2014   Abnormal CT scan,  small bowel 12/28/2015   Acute bronchitis 04/09/2013   Acute conjunctivitis of both eyes 09/04/2017   Acute pharyngitis 04/13/2013   ADHD (attention deficit hyperactivity disorder) 10/21/2006   Qualifier: Diagnosis of  By: Maxie Better FNP, Rosalita Levan    AKI (acute kidney injury) ([place]) 06/26/2017   Alternating constipation and diarrhea 10/12/2015   ANGER 10/31/2006   Qualifier: Diagnosis of  By: Maxie Better FNP, Rosalita Levan    ANXIETY 11/14/2009   Qualifier: Diagnosis of  By: Deborra Medina MD, Talia     Asthma    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   Qualifier: Diagnosis of  By: Maxie Better FNP, Billie-Lynn Daniels    Bronchitis    Calculus of gallbladder with acute cholecystitis without obstruction 02/15/2015   Chest pain 03/17/2018   Chest wall pain 02/10/2015   Cholelithiasis 02/13/2015   Chronic bronchitis ([place])    Complication of anesthesia    usually wake up in the middle of procedure or surgery   Conjunctivitis 10/18/2010   Dehydration 06/26/2017   DEPENDENT EDEMA 11/09/2008   Qualifier: Diagnosis of  By: Maxie Better FNP, Rosalita Levan    Diarrhea 11/06/2016   Enteritis 12/21/2015   Erectile dysfunction 04/11/2017   Gallstone 02/10/2015   Gallstones    GASTROENTERITIS WITHOUT DEHYDRATION 05/15/2010   Qualifier: Diagnosis of  By: Deborra Medina MD, Talia     GERD 10/21/2006   Qualifier: Diagnosis of  By: Maxie Better FNP, Rosalita Levan    GERD (gastroesophageal reflux disease)  Hepatic steatosis 05/31/2015   History of colonic polyps 12/28/2015   Hypertension    Lumbar back sprain 01/28/2017   Mole of skin 05/28/2012   Morbid obesity ([place]) 07/30/2006   Qualifier: Diagnosis of  By: Maxie Better FNP, Rosalita Levan    Nausea vomiting and diarrhea 07/31/2014   Obesity    OSA (obstructive sleep apnea) 07/30/2006   Qualifier: Diagnosis of  By: Maxie Better FNP, Rosalita Levan    Partial small bowel obstruction ([place]) 12/14/2015   Right knee pain 12/01/2012   SBO (small bowel  obstruction) ([place]) 06/26/2017   Skin lesion 11/07/2014   Skin tag 05/28/2012   Sleep apnea    "suppose to wear mask but I don't" (05/31/2015)   Small bowel obstruction ([place]) 05/31/2015   Small bowel obstruction, partial ([place]) 12/13/2015   Smoker 09/04/2017   Viral gastroenteritis 03/23/2016     Social History   Socioeconomic History   Marital status: Legally Separated    Spouse name: Not on file   Number of children: 1   Years of education: Not on file   Highest education level: Not on file  Occupational History   Occupation: Curator    Employer: [employer]  Tobacco Use   Smoking status: Former Smoker    Packs/day: 1.00    Years: 20.00    Pack years: 20.00    Types: Cigarettes    Start date: 10/03/2016    Quit date: 10/03/2017    Years since quitting: 2.2   Smokeless tobacco: Current User    Types: Snuff  Vaping Use   Vaping Use: Never used  Substance and Sexual Activity   Alcohol use: Yes    Alcohol/week: 13.0 standard drinks    Types: 13 Shots of liquor per week    Comment: "a couple of shots a day"   Drug use: No   Sexual activity: Yes  Other Topics Concern   Not on file  Social History Narrative   Not on file   Social Determinants of Health   Financial Resource Strain:    Difficulty of Paying Living Expenses: Not on file  Food Insecurity:    Worried About [place] in the Last Year: Not on file   [employer] of Food in the Last Year: Not on file  Transportation Needs:    Lack of Transportation (Medical): Not on file   Lack of Transportation (Non-Medical): Not on file  Physical Activity:    Days of Exercise per Week: Not on file   Minutes of Exercise per Session: Not on file  Stress:    Feeling of Stress : Not on file  Social Connections:    Frequency of Communication with Friends and Family: Not on file   Frequency of Social Gatherings with Friends and Family: Not on file   Attends Religious Services: Not on  file   Active Member of Clubs or Organizations: Not on file   Attends Archivist Meetings: Not on file   Marital Status: Not on file  Intimate Partner Violence:    Fear of Current or Ex-Partner: Not on file   Emotionally Abused: Not on file   Physically Abused: Not on file   Sexually Abused: Not on file    Past Surgical History:  Procedure Laterality Date   CHOLECYSTECTOMY N/A 02/14/2015   Procedure: LAPAROSCOPIC CHOLECYSTECTOMY WITH INTRAOPERATIVE CHOLANGIOGRAM;  Surgeon: Coralie Keens, MD;  Location: [facility];  Service: General;  Laterality: N/A;   COLONOSCOPY  10/16/2005   Polyps (  tubular adenoma 2007), external hemorrhoids   COLONOSCOPY WITH PROPOFOL N/A 01/05/2016   Procedure: COLONOSCOPY WITH PROPOFOL;  Surgeon: Milus Banister, MD;  Location: [facility];  Service: Endoscopy;  Laterality: N/A;   FINGER SURGERY Left    reconstructive - 2 surgeries on hand   LAPAROSCOPIC GASTRIC SLEEVE RESECTION  10/2010    at [facility] History  Problem Relation Age of Onset   Diabetes Mellitus II Mother    Breast cancer Mother    Ovarian cancer Mother    Diabetes Mellitus II Father    CAD Father    Stroke Father    Cancer Father        ?   Clotting disorder Father        blood clots in brain with surgery   Cirrhosis Maternal Grandmother    Breast cancer Maternal Grandmother     Allergies  Allergen Reactions   Bee Venom Anaphylaxis   Codeine Anaphylaxis    Tolerates dilaudid   Hornet Venom Anaphylaxis   Penicillins Hives and Other (See Comments)    Has patient had a PCN reaction causing immediate rash, facial/tongue/throat swelling, SOB or lightheadedness with hypotensionYES Has patient had a PCN reaction causing severe rash involving mucus membranes or skin necrosis: NO Has patient had a PCN reaction that required hospitalization NO Has patient had a PCN reaction occurring within the last 10 years: NO If all of the above answers are  "NO", then may proceed with Cephalosporin use.    Current Outpatient Medications on File Prior to Visit  Medication Sig Dispense Refill   albuterol (ACCUNEB) 0.63 MG/3ML nebulizer solution USE 1 VIAL IN NEBULIZER EVERY 6 HOURS AS NEEDED FOR WHEEZING OR  SHORTNESS  OF  BREATH (Patient taking differently: Take 1 ampule by nebulization every 6 (six) hours as needed for wheezing or shortness of breath. ) 90 mL 0   albuterol (PROAIR HFA) 108 (90 Base) MCG/ACT inhaler Inhale 2 puffs into the lungs every 6 (six) hours as needed for wheezing or shortness of breath. 18 g 3   amLODipine (NORVASC) 10 MG tablet Take 1 tablet by mouth once daily (Patient taking differently: Take 10 mg by mouth daily. ) 90 tablet 1   cetirizine (ZYRTEC) 10 MG tablet Take 10 mg by mouth daily.     omeprazole (PRILOSEC) 20 MG capsule Take 1 capsule by mouth once daily 90 capsule 0   ondansetron (ZOFRAN ODT) 4 MG disintegrating tablet Take 1 tablet (4 mg total) by mouth every 8 (eight) hours as needed for nausea or vomiting. 15 tablet 0   sildenafil (REVATIO) 20 MG tablet Take 2-5 tablets by mouth 30 minutes prior to intercourse. 50 tablet 0   sucralfate (CARAFATE) 1 g tablet Take 1 tablet (1 g total) by mouth 4 (four) times daily -  with meals and at bedtime. 120 tablet 0   Current Facility-Administered Medications on File Prior to Visit  Medication Dose Route Frequency Provider Last Rate Last Admin   technetium tetrofosmin (TC-MYOVIEW) injection 01.6 millicurie  01.0 millicurie Intravenous Once PRN Nahser, Wonda Cheng, MD        BP (!) 154/88    Pulse 100    Temp 98.1 F (36.7 C) (Temporal)    Ht 6\' 2"  (1.88 m)    Wt (!) 397 lb (180.1 kg)    SpO2 97%    BMI 50.97 kg/m    Objective:   Physical Exam Cardiovascular:     Rate and Rhythm:  Normal rate and regular rhythm.  Pulmonary:     Effort: Pulmonary effort is normal.     Breath sounds: Normal breath sounds.  Musculoskeletal:     Cervical back: Neck supple.    Skin:    General: Skin is warm and dry.            Assessment & Plan:

## 2020-02-02 ENCOUNTER — Telehealth: Payer: Self-pay

## 2020-02-02 NOTE — Telephone Encounter (Signed)
Noted, will evaluate. 

## 2020-02-02 NOTE — Telephone Encounter (Signed)
[facility] - Client TELEPHONE ADVICE RECORD [facility] Patient Name: [name] [name] Gender: Male DOB: 06-15-1970 Age: 49 Y 1 M 9 D Return Phone Number: [phone] (Primary) Address: City/State/Zip: [place] [address] Client [facility] - Client Client Site [facility] - Day Physician Alma Friendly - NP Contact Type Call Who Is Calling Patient / Member / Family / Caregiver Call Type Triage / Clinical Relationship To Patient Self Return Phone Number [phone] (Primary) Chief Complaint Vomiting Reason for Call Symptomatic / Request for [place] states he has vomiting, diarrhea and body aches. Caller states he has had urine output in the last 8 hours. Translation No Nurse Assessment Nurse: Sumner Boast, RN, [place] Derry Date/Time Eilene Ghazi Time): 02/02/2020 9:43:47 AM Confirm and document reason for call. If symptomatic, describe symptoms. ---Caller states he has vomiting, diarrhea, and body aches. Symptoms started 3 days ago. States diarrhea is gone but still has nausea. Has vomited X 1 in last 24 hours. No abd pain. No fever. No blood. Does the patient have any new or worsening symptoms? ---Yes Will a triage be completed? ---Yes Related visit to physician within the last 2 weeks? ---No Does the PT have any chronic conditions? (i.e. diabetes, asthma, this includes High risk factors for pregnancy, etc.) ---Yes List chronic conditions. ---COPD Is this a behavioral health or substance abuse call? ---No Guidelines Guideline Title Affirmed Question Affirmed Notes Nurse Date/Time Eilene Ghazi Time) Vomiting [1] MILD or MODERATE vomiting AND [2] present > 48 hours (2 days) (Exception: mild vomiting with associated diarrhea) Sumner Boast, RN, [place] Derry 02/02/2020 9:47:56 AM Disp. Time Eilene Ghazi Time) Disposition Final User 02/02/2020 9:51:44 AM See PCP within 24 Hours Yes Sumner Boast,  RN, [place] D[place] PLEASE NOTE: All timestamps contained within this report are represented as [place] Standard Time. CONFIDENTIALTY NOTICE: This fax transmission is intended only for the addressee. It contains information that is legally privileged, confidential or otherwise protected from use or disclosure. If you are not the intended recipient, you are strictly prohibited from reviewing, disclosing, copying using or disseminating any of this information or taking any action in reliance on or regarding this information. If you have received this fax in error, please notify us immediately by telephone so that we can arrange for its return to [place]. Phone: [phone], Toll-Free: [phone], Fax: [phone] Page: 2 of 2 Call Id: [id number] [place] Disagree/Comply Comply Caller Understands Yes PreDisposition Call Doctor Care Advice Given Per Guideline SEE PCP WITHIN 24 HOURS: CALL BACK IF: * You become worse CARE ADVICE per Vomiting (Adult) guideline. Referrals REFERRED TO PCP OFFICE

## 2020-02-03 ENCOUNTER — Encounter: Payer: Self-pay | Admitting: Primary Care

## 2020-02-03 ENCOUNTER — Other Ambulatory Visit: Payer: Self-pay

## 2020-02-03 ENCOUNTER — Telehealth (INDEPENDENT_AMBULATORY_CARE_PROVIDER_SITE_OTHER): Payer: BC Managed Care – PPO | Admitting: Primary Care

## 2020-02-03 DIAGNOSIS — R112 Nausea with vomiting, unspecified: Secondary | ICD-10-CM | POA: Diagnosis not present

## 2020-02-03 DIAGNOSIS — R197 Diarrhea, unspecified: Secondary | ICD-10-CM | POA: Diagnosis not present

## 2020-02-03 NOTE — Assessment & Plan Note (Signed)
Acute symptoms with N/V/D x 48 hours, improved now. Suspect viral etiology given symptoms and duration. He will get Covid-19 tested at work.  Discussed importance of hydration with water and electrolytes. Advance diet slowly as tolerated.   Agree that he may return to work, work note provided.  He will update if anything changes.

## 2020-02-03 NOTE — Patient Instructions (Signed)
Ensure you are consuming 64 ounces of water daily.  Please notify me if your symptoms return.  It was a pleasure to see you today! [name], NP-C

## 2020-02-03 NOTE — Progress Notes (Addendum)
Subjective:    Patient ID: [name] [name], male    DOB: [birth year], 49 y.o.   MRN: [id number]  HPI  Virtual Visit via Video Note  I connected with [name] [name] on 02/03/20 at 10:40 AM EST by a video enabled telemedicine application and verified that I am speaking with the correct person using two identifiers.  Location: Patient: Home Provider: Office Participants: Patient and myself   I discussed the limitations of evaluation and management by telemedicine and the availability of in person appointments. The patient expressed understanding and agreed to proceed.  We attempted to connect via video but he could not enable his camera. We conducted his visit via phone which lasted 5 min and 47 sec.  History of Present Illness:  [name] [name] is a 49 year old male with a history of diarrhea, hypertension, nausea and vomiting, Covid-19 infection, morbid obesity who presents today with a chief complaint of diarrhea.  Symptoms began three mornings ago with nausea, vomiting, diarrhea, body aches that woke him from sleep. Over the last several days he was experincing numerous episodes of vomiting and diarrhea. He had some "left over" Thanksgiving food the night prior for which he suspects to be the cause as nothing else has changed. He's tolerated Lexapro well since initiation a few weeks ago.   He's had a decrease in appetite up until today, was able to eat peanut butter crackers today without difficulty. He's had no diarrhea or vomiting since yesterday, and is feeling much better. He's been hydrating well with water. He will get Covid-19 tested tomorrow at work. He is needing a work excuse from Saturday the 27th through today the 1st. He would like to return to work tomorrow.    Observations/Objective:  Alert and oriented. No distress. Speaking in complete sentences.   Assessment and Plan:  Acute symptoms with N/V/D x 48 hours, improved now. Suspect viral etiology given symptoms  and duration. He will get Covid-19 tested at work.  Discussed importance of hydration with water and electrolytes. Advance diet slowly as tolerated.   Agree that he may return to work, work note provided.  He will update if anything changes.  Follow Up Instructions:  Ensure you are consuming 64 ounces of water daily.  Please notify me if your symptoms return.  It was a pleasure to see you today! [name] Bossier, NP-C    I discussed the assessment and treatment plan with the patient. The patient was provided an opportunity to ask questions and all were answered. The patient agreed with the plan and demonstrated an understanding of the instructions.   The patient was advised to call back or seek an in-person evaluation if the symptoms worsen or if the condition fails to improve as anticipated.    [name] [name], NP    Review of Systems  Constitutional: Positive for chills. Negative for fever.  Gastrointestinal: Positive for diarrhea, nausea and vomiting.  Musculoskeletal: Positive for myalgias.       Past Medical History:  Diagnosis Date  . Abdominal pain 07/31/2014  . Abnormal CT scan, small bowel 12/28/2015  . Acute bronchitis 04/09/2013  . Acute conjunctivitis of both eyes 09/04/2017  . Acute pharyngitis 04/13/2013  . ADHD (attention deficit hyperactivity disorder) 10/21/2006   Qualifier: Diagnosis of  By: Maxie Better FNP, Rosalita Levan   . AKI (acute kidney injury) ([place]) 06/26/2017  . Alternating constipation and diarrhea 10/12/2015  . ANGER 10/31/2006   Qualifier: Diagnosis of  By: Maxie Better FNP, Rosalita Levan   .  ANXIETY 11/14/2009   Qualifier: Diagnosis of  By: Deborra Medina MD, Tanja Port    . Asthma   . 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 Bronchitis   . Calculus of gallbladder with acute cholecystitis without obstruction 02/15/2015  . Chest pain 03/17/2018  . Chest wall pain 02/10/2015  . Cholelithiasis  02/13/2015  . Chronic bronchitis ([place])   . Complication of anesthesia    usually wake up in the middle of procedure or surgery  . Conjunctivitis 10/18/2010  . COPD exacerbation ([place]) 10/13/2019  . COPD with acute exacerbation ([place]) 06/26/2017  . Dehydration 06/26/2017  . DEPENDENT EDEMA 11/09/2008   Qualifier: Diagnosis of  By: Maxie Better FNP, Rosalita Levan   . Diarrhea 11/06/2016  . Enteritis 12/21/2015  . Erectile dysfunction 04/11/2017  . Gallstone 02/10/2015  . Gallstones   . GASTROENTERITIS WITHOUT DEHYDRATION 05/15/2010   Qualifier: Diagnosis of  By: Deborra Medina MD, Tanja Port    . GERD 10/21/2006   Qualifier: Diagnosis of  By: Maxie Better FNP, Rosalita Levan   . GERD (gastroesophageal reflux disease)   . Hepatic steatosis 05/31/2015  . History of colonic polyps 12/28/2015  . Hypertension   . Lumbar back sprain 01/28/2017  . Mole of skin 05/28/2012  . Morbid obesity ([place]) 07/30/2006   Qualifier: Diagnosis of  By: Maxie Better FNP, Rosalita Levan   . Nausea vomiting and diarrhea 07/31/2014  . Obesity   . OSA (obstructive sleep apnea) 07/30/2006   Qualifier: Diagnosis of  By: Maxie Better FNP, Rosalita Levan   . Partial small bowel obstruction ([place]) 12/14/2015  . Right knee pain 12/01/2012  . SBO (small bowel obstruction) ([place]) 06/26/2017  . Skin lesion 11/07/2014  . Skin tag 05/28/2012  . Sleep apnea    "suppose to wear mask but I don't" (05/31/2015)  . Small bowel obstruction ([place]) 05/31/2015  . Small bowel obstruction, partial ([place]) 12/13/2015  . Smoker 09/04/2017  . Viral gastroenteritis 03/23/2016     Social History   Socioeconomic History  . Marital status: Legally Separated    Spouse name: Not on file  . Number of children: 1  . Years of education: Not on file  . Highest education level: Not on file  Occupational History  . Occupation: Publishing rights manager: [employer]  Tobacco Use  . Smoking status: Former Smoker    Packs/day: 1.00    Years: 20.00    Pack years: 20.00    Types:  Cigarettes    Start date: 10/03/2016    Quit date: 10/03/2017    Years since quitting: 2.3  . Smokeless tobacco: Current User    Types: Snuff  Vaping Use  . Vaping Use: Never used  Substance and Sexual Activity  . Alcohol use: Yes    Alcohol/week: 13.0 standard drinks    Types: 13 Shots of liquor per week    Comment: "a couple of shots a day"  . Drug use: No  . Sexual activity: Yes  Other Topics Concern  . Not on file  Social History Narrative  . Not on file   Social Determinants of Health   Financial Resource Strain:   . Difficulty of Paying Living Expenses: Not on file  Food Insecurity:   . Worried About Charity fundraiser in the Last Year: Not on file  . Ran Out of Food in the Last Year: Not on file  Transportation Needs:   . Lack of Transportation (Medical):  Not on file  . Lack of Transportation (Non-Medical): Not on file  Physical Activity:   . Days of Exercise per Week: Not on file  . Minutes of Exercise per Session: Not on file  Stress:   . Feeling of Stress : Not on file  Social Connections:   . Frequency of Communication with Friends and Family: Not on file  . Frequency of Social Gatherings with Friends and Family: Not on file  . Attends Religious Services: Not on file  . Active Member of Clubs or Organizations: Not on file  . Attends Archivist Meetings: Not on file  . Marital Status: Not on file  Intimate Partner Violence:   . Fear of Current or Ex-Partner: Not on file  . Emotionally Abused: Not on file  . Physically Abused: Not on file  . Sexually Abused: Not on file    Past Surgical History:  Procedure Laterality Date  . CHOLECYSTECTOMY N/A 02/14/2015   Procedure: LAPAROSCOPIC CHOLECYSTECTOMY WITH INTRAOPERATIVE CHOLANGIOGRAM;  Surgeon: Coralie Keens, MD;  Location: [facility];  Service: General;  Laterality: N/A;  . COLONOSCOPY  10/16/2005   Polyps (tubular adenoma 2007), external hemorrhoids  . COLONOSCOPY WITH PROPOFOL N/A 01/05/2016    Procedure: COLONOSCOPY WITH PROPOFOL;  Surgeon: Milus Banister, MD;  Location: [facility];  Service: Endoscopy;  Laterality: N/A;  . FINGER SURGERY Left    reconstructive - 2 surgeries on hand  . LAPAROSCOPIC GASTRIC SLEEVE RESECTION  10/2010    at [facility] History  Problem Relation Age of Onset  . Diabetes Mellitus II Mother   . Breast cancer Mother   . Ovarian cancer Mother   . Diabetes Mellitus II Father   . CAD Father   . Stroke Father   . Cancer Father        ?  . Clotting disorder Father        blood clots in brain with surgery  . Cirrhosis Maternal Grandmother   . Breast cancer Maternal Grandmother     Allergies  Allergen Reactions  . Bee Venom Anaphylaxis  . Codeine Anaphylaxis    Tolerates dilaudid  . Hornet Venom Anaphylaxis  . Penicillins Hives and Other (See Comments)    Has patient had a PCN reaction causing immediate rash, facial/tongue/throat swelling, SOB or lightheadedness with hypotensionYES Has patient had a PCN reaction causing severe rash involving mucus membranes or skin necrosis: NO Has patient had a PCN reaction that required hospitalization NO Has patient had a PCN reaction occurring within the last 10 years: NO If all of the above answers are "NO", then may proceed with Cephalosporin use.    Current Outpatient Medications on File Prior to Visit  Medication Sig Dispense Refill  . albuterol (ACCUNEB) 0.63 MG/3ML nebulizer solution USE 1 VIAL IN NEBULIZER EVERY 6 HOURS AS NEEDED FOR WHEEZING OR  SHORTNESS  OF  BREATH (Patient taking differently: Take 1 ampule by nebulization every 6 (six) hours as needed for wheezing or shortness of breath. ) 90 mL 0  . albuterol (PROAIR HFA) 108 (90 Base) MCG/ACT inhaler Inhale 2 puffs into the lungs every 6 (six) hours as needed for wheezing or shortness of breath. 18 g 3  . amLODipine (NORVASC) 10 MG tablet Take 1 tablet by mouth once daily (Patient taking differently: Take 10 mg by mouth daily. ) 90 tablet 1   . cetirizine (ZYRTEC) 10 MG tablet Take 10 mg by mouth daily.    Marland Kitchen escitalopram (LEXAPRO) 10 MG  tablet Take 1 tablet (10 mg total) by mouth daily. For anxiety and depression. 30 tablet 1  . Fluticasone-Umeclidin-Vilant (TRELEGY ELLIPTA) 100-62.5-25 MCG/INH AEPB Inhale 1 puff into the lungs daily. 3 each 3  . omeprazole (PRILOSEC) 20 MG capsule Take 1 capsule by mouth once daily 90 capsule 0  . ondansetron (ZOFRAN ODT) 4 MG disintegrating tablet Take 1 tablet (4 mg total) by mouth every 8 (eight) hours as needed for nausea or vomiting. 15 tablet 0  . sildenafil (REVATIO) 20 MG tablet Take 2-5 tablets by mouth 30 minutes prior to intercourse. 50 tablet 0   Current Facility-Administered Medications on File Prior to Visit  Medication Dose Route Frequency Provider Last Rate Last Admin  . technetium tetrofosmin (TC-MYOVIEW) injection 91.7 millicurie  91.5 millicurie Intravenous Once PRN Nahser, Wonda Cheng, MD        Ht 6\' 2"  (1.88 m)   Wt (!) 397 lb (180.1 kg)   BMI 50.97 kg/m    Objective:   Physical Exam Constitutional:      General: He is not in acute distress. Pulmonary:     Effort: Pulmonary effort is normal.     Comments: No cough during visit  Neurological:     Mental Status: He is alert and oriented to person, place, and time.  Psychiatric:        Mood and Affect: Mood normal.            Assessment & Plan:

## 2020-02-15 ENCOUNTER — Encounter: Payer: Self-pay | Admitting: Primary Care

## 2020-02-15 ENCOUNTER — Telehealth: Payer: Self-pay | Admitting: Primary Care

## 2020-02-15 ENCOUNTER — Telehealth (INDEPENDENT_AMBULATORY_CARE_PROVIDER_SITE_OTHER): Payer: BC Managed Care – PPO | Admitting: Primary Care

## 2020-02-15 ENCOUNTER — Other Ambulatory Visit: Payer: Self-pay

## 2020-02-15 VITALS — Ht 74.0 in | Wt 375.0 lb

## 2020-02-15 DIAGNOSIS — Z1152 Encounter for screening for COVID-19: Secondary | ICD-10-CM

## 2020-02-15 DIAGNOSIS — R112 Nausea with vomiting, unspecified: Secondary | ICD-10-CM | POA: Diagnosis not present

## 2020-02-15 MED ORDER — SUCRALFATE 1 G PO TABS: 1.0000 g | ORAL_TABLET | Freq: Three times a day (TID) | ORAL | 0 refills | Status: DC

## 2020-02-15 NOTE — Telephone Encounter (Signed)
Pt called in wanted to know about getting a covid test tomorrow when he gets his labs done

## 2020-02-15 NOTE — Progress Notes (Signed)
Subjective:    Patient ID: [name] [name], male    DOB: 1971/01/03, 49 y.o.   MRN: [id number]  HPI     [name] [name] - 49 y.o. male   MRN [id number]   Date of Birth: [birth year]  PCP: [name], NP  This service was provided via telemedicine. Phone Visit performed on 02/15/2020    Rationale for phone visit along with limitations reviewed. I discussed the limitations, risks, security and privacy concerns of performing a phone visit and the availability of in person appointments. I also discussed with the patient that there may be a patient responsible charge related to this service. Patient consented to telephone encounter.    Location of patient: Home Location of provider: Office [facility] [employer] Participants: Patient and myself Name of referring provider: N/A   Names of persons and role in encounter: Provider: [name], NP  Patient: [name] [name]  Other: N/A   Time on call: 13 min - 45 sec   Subjective: Chief Complaint  Patient presents with   Nausea    See my chart message      HPI:  [name] [name] is a 49 year old male with a history of hypertension, OSA, COPD, nausea and vomiting, Covid-19 infection, GAD, MDD, alcohol abuse, cholecystectomy who presents today with a chief complaint of nausea and vomiting.  Recent episode of same symptoms, resolved for a few days and was able to return to work, but symptoms have now returned which began 8 days ago.   Each morning he wakes up feeling nauseated, also feels nauseated when he rises from bed after taking a nap. Intermittent throughout the day.  Also with upper umbilical abdominal cramping that is intermittent. He does have generalized abdominal cramping at times. He has had a few drinks of alcohol over the last three weeks, significantly reduced from his normal intake. He is compliant to his escitalopram (Lexapro) 10 mg daily, has noticed improvement in anxiety. He had a tough time with  citalopram which caused nausea and vomiting, he was mixing with alcohol at the time.  He is compliant to his omeprazole 20 mg which helps a lot with GERD symptoms. He was prescribed Carafate once, this helped significantly with symptoms of nausea and vomiting. He's never undergone H pylori.  He does not believe that he has had an endoscopy, he did undergo colonoscopy in 2017, recall due in 2022.  He has not seen GI for his nausea and vomiting symptoms before.  He denies diarrhea, bloody stools, unexplained weight loss.  He is needing a note to return to work, last day of work was Sunday December 5th, he needs to go back December 15th.  Objective/Observations:  No physical exam or vital signs collected unless specifically identified below.   Ht 6\' 2"  (1.88 m)    Wt (!) 375 lb (170.1 kg)    BMI 48.15 kg/m    Respiratory status: speaks in complete sentences without evident shortness of breath.   Assessment/Plan:  No problem-specific Assessment & Plan notes found for this encounter.   I discussed the assessment and treatment plan with the patient. The patient was provided an opportunity to ask questions and all were answered. The patient agreed with the plan and demonstrated an understanding of the instructions.  Lab Orders  No laboratory test(s) ordered today    No orders of the defined types were placed in this encounter.   The patient was advised to call back or seek an in-person  evaluation if the symptoms worsen or if the condition fails to improve as anticipated.  [name], NP    Review of Systems  Constitutional: Negative for fever.  Gastrointestinal: Positive for abdominal pain, nausea and vomiting. Negative for blood in stool, constipation and diarrhea.       Past Medical History:  Diagnosis Date   Abdominal pain 07/31/2014   Abnormal CT scan, small bowel 12/28/2015   Acute bronchitis 04/09/2013   Acute conjunctivitis of both eyes 09/04/2017   Acute  pharyngitis 04/13/2013   ADHD (attention deficit hyperactivity disorder) 10/21/2006   Qualifier: Diagnosis of  By: Maxie Better FNP, Rosalita Levan    AKI (acute kidney injury) ([place]) 06/26/2017   Alternating constipation and diarrhea 10/12/2015   ANGER 10/31/2006   Qualifier: Diagnosis of  By: Maxie Better FNP, Rosalita Levan    ANXIETY 11/14/2009   Qualifier: Diagnosis of  By: Deborra Medina MD, Talia     Asthma    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   Qualifier: Diagnosis of  By: Maxie Better FNP, Billie-Lynn Daniels    Bronchitis    Calculus of gallbladder with acute cholecystitis without obstruction 02/15/2015   Chest pain 03/17/2018   Chest wall pain 02/10/2015   Cholelithiasis 02/13/2015   Chronic bronchitis ([place])    Complication of anesthesia    usually wake up in the middle of procedure or surgery   Conjunctivitis 10/18/2010   COPD exacerbation ([place]) 10/13/2019   COPD with acute exacerbation ([place]) 06/26/2017   Dehydration 06/26/2017   DEPENDENT EDEMA 11/09/2008   Qualifier: Diagnosis of  By: Maxie Better FNP, Rosalita Levan    Diarrhea 11/06/2016   Enteritis 12/21/2015   Erectile dysfunction 04/11/2017   Gallstone 02/10/2015   Gallstones    GASTROENTERITIS WITHOUT DEHYDRATION 05/15/2010   Qualifier: Diagnosis of  By: Deborra Medina MD, Talia     GERD 10/21/2006   Qualifier: Diagnosis of  By: Maxie Better FNP, Rosalita Levan    GERD (gastroesophageal reflux disease)    Hepatic steatosis 05/31/2015   History of colonic polyps 12/28/2015   Hypertension    Lumbar back sprain 01/28/2017   Mole of skin 05/28/2012   Morbid obesity ([place]) 07/30/2006   Qualifier: Diagnosis of  By: Maxie Better FNP, Billie-Lynn Daniels    Nausea vomiting and diarrhea 07/31/2014   Obesity    OSA (obstructive sleep apnea) 07/30/2006   Qualifier: Diagnosis of  By: Maxie Better FNP, Billie-Lynn Daniels    Partial small bowel obstruction ([place]) 12/14/2015   Right knee pain 12/01/2012   SBO (small bowel obstruction) ([place])  06/26/2017   Skin lesion 11/07/2014   Skin tag 05/28/2012   Sleep apnea    "suppose to wear mask but I don't" (05/31/2015)   Small bowel obstruction ([place]) 05/31/2015   Small bowel obstruction, partial ([place]) 12/13/2015   Smoker 09/04/2017   Viral gastroenteritis 03/23/2016     Social History   Socioeconomic History   Marital status: Legally Separated    Spouse name: Not on file   Number of children: 1   Years of education: Not on file   Highest education level: Not on file  Occupational History   Occupation: Curator    Employer: [employer]  Tobacco Use   Smoking status: Former Smoker    Packs/day: 1.00    Years: 20.00    Pack years: 20.00    Types: Cigarettes    Start date: 10/03/2016    Quit date: 10/03/2017    Years since quitting: 2.3   Smokeless tobacco: Current User  Types: Snuff  Vaping Use   Vaping Use: Never used  Substance and Sexual Activity   Alcohol use: Yes    Alcohol/week: 13.0 standard drinks    Types: 13 Shots of liquor per week    Comment: "a couple of shots a day"   Drug use: No   Sexual activity: Yes  Other Topics Concern   Not on file  Social History Narrative   Not on file   Social Determinants of Health   Financial Resource Strain: Not on file  Food Insecurity: Not on file  Transportation Needs: Not on file  Physical Activity: Not on file  Stress: Not on file  Social Connections: Not on file  Intimate Partner Violence: Not on file    Past Surgical History:  Procedure Laterality Date   CHOLECYSTECTOMY N/A 02/14/2015   Procedure: LAPAROSCOPIC CHOLECYSTECTOMY WITH INTRAOPERATIVE CHOLANGIOGRAM;  Surgeon: Coralie Keens, MD;  Location: [facility] OR;  Service: General;  Laterality: N/A;   COLONOSCOPY  10/16/2005   Polyps (tubular adenoma 2007), external hemorrhoids   COLONOSCOPY WITH PROPOFOL N/A 01/05/2016   Procedure: COLONOSCOPY WITH PROPOFOL;  Surgeon: Milus Banister, MD;  Location: [facility] ENDOSCOPY;  Service:  Endoscopy;  Laterality: N/A;   FINGER SURGERY Left    reconstructive - 2 surgeries on hand   LAPAROSCOPIC GASTRIC SLEEVE RESECTION  10/2010    at [facility] History  Problem Relation Age of Onset   Diabetes Mellitus II Mother    Breast cancer Mother    Ovarian cancer Mother    Diabetes Mellitus II Father    CAD Father    Stroke Father    Cancer Father        ?   Clotting disorder Father        blood clots in brain with surgery   Cirrhosis Maternal Grandmother    Breast cancer Maternal Grandmother     Allergies  Allergen Reactions   Bee Venom Anaphylaxis   Codeine Anaphylaxis    Tolerates dilaudid   Hornet Venom Anaphylaxis   Penicillins Hives and Other (See Comments)    Has patient had a PCN reaction causing immediate rash, facial/tongue/throat swelling, SOB or lightheadedness with hypotensionYES Has patient had a PCN reaction causing severe rash involving mucus membranes or skin necrosis: NO Has patient had a PCN reaction that required hospitalization NO Has patient had a PCN reaction occurring within the last 10 years: NO If all of the above answers are "NO", then may proceed with Cephalosporin use.    Current Outpatient Medications on File Prior to Visit  Medication Sig Dispense Refill   albuterol (ACCUNEB) 0.63 MG/3ML nebulizer solution USE 1 VIAL IN NEBULIZER EVERY 6 HOURS AS NEEDED FOR WHEEZING OR  SHORTNESS  OF  BREATH (Patient taking differently: Take 1 ampule by nebulization every 6 (six) hours as needed for wheezing or shortness of breath.) 90 mL 0   albuterol (PROAIR HFA) 108 (90 Base) MCG/ACT inhaler Inhale 2 puffs into the lungs every 6 (six) hours as needed for wheezing or shortness of breath. 18 g 3   amLODipine (NORVASC) 10 MG tablet Take 1 tablet by mouth once daily (Patient taking differently: Take 10 mg by mouth daily.) 90 tablet 1   cetirizine (ZYRTEC) 10 MG tablet Take 10 mg by mouth daily.     escitalopram (LEXAPRO) 10 MG tablet  Take 1 tablet (10 mg total) by mouth daily. For anxiety and depression. 30 tablet 1   Fluticasone-Umeclidin-Vilant (TRELEGY ELLIPTA) 100-62.5-25 MCG/INH AEPB  Inhale 1 puff into the lungs daily. 3 each 3   omeprazole (PRILOSEC) 20 MG capsule Take 1 capsule by mouth once daily 90 capsule 0   ondansetron (ZOFRAN ODT) 4 MG disintegrating tablet Take 1 tablet (4 mg total) by mouth every 8 (eight) hours as needed for nausea or vomiting. 15 tablet 0   sildenafil (REVATIO) 20 MG tablet Take 2-5 tablets by mouth 30 minutes prior to intercourse. 50 tablet 0   Current Facility-Administered Medications on File Prior to Visit  Medication Dose Route Frequency Provider Last Rate Last Admin   technetium tetrofosmin (TC-MYOVIEW) injection 07.6 millicurie  80.8 millicurie Intravenous Once PRN Nahser, Wonda Cheng, MD        Ht 6\' 2"  (1.88 m)    Wt (!) 375 lb (170.1 kg)    BMI 48.15 kg/m    Objective:   Physical Exam Constitutional:      General: He is not in acute distress. Pulmonary:     Effort: Pulmonary effort is normal.  Neurological:     Mental Status: He is alert and oriented to person, place, and time.  Psychiatric:        Mood and Affect: Mood normal.            Assessment & Plan:

## 2020-02-15 NOTE — Assessment & Plan Note (Addendum)
Chronic for months now, seems to wax and wane, has become recurrent throughout the year 2021.  Differentials include H. pylori ulceration, gastric or duodenal ulceration, uncontrolled GERD, side effects from SSRI, pancreatitis.  Recommended that we test for H. pylori, but he does not believe that he can come off his omeprazole.  For this reason we will send him to GI as he likely needs an upper endoscopy anyway.  Checking labs today including lipase, CBC with differential, BMP.  Continue omeprazole 20 mg daily for GERD.  I am uncertain that his Lexapro is the main cause for symptoms, but will keep in mind.  He is doing much better overall regarding mood on Lexapro.  He is status post cholecystectomy years ago. CT scan from July 2021 reviewed, grossly unremarkable.  Agreed to refill Carafate to use as needed for now so that he can return to work.  Referral placed to GI for evaluation.  Work note provided.

## 2020-02-15 NOTE — Telephone Encounter (Signed)
Yes, that is fine. Is he having any Covid symptoms, he did not mention this during our visit. He may just need the test in order to return to work.

## 2020-02-15 NOTE — Telephone Encounter (Signed)
Ok to order? He will have to change to drive up and have labs done outside?

## 2020-02-15 NOTE — Telephone Encounter (Signed)
Called patient is screening for work. No symptoms. Have added to appointment note. Has the number to call and informed to drive to back of building.

## 2020-02-15 NOTE — Telephone Encounter (Signed)
Noted  

## 2020-02-16 ENCOUNTER — Other Ambulatory Visit (INDEPENDENT_AMBULATORY_CARE_PROVIDER_SITE_OTHER): Payer: BC Managed Care – PPO

## 2020-02-16 DIAGNOSIS — R112 Nausea with vomiting, unspecified: Secondary | ICD-10-CM | POA: Diagnosis not present

## 2020-02-16 DIAGNOSIS — Z1152 Encounter for screening for COVID-19: Secondary | ICD-10-CM

## 2020-02-16 NOTE — Addendum Note (Signed)
Addended by: Cloyd Stagers on: 02/16/2020 03:17 PM   Modules accepted: Orders

## 2020-02-17 LAB — BASIC METABOLIC PANEL
BUN: 15 mg/dL (ref 6–23)
CO2: 23 mEq/L (ref 19–32)
Calcium: 8.8 mg/dL (ref 8.4–10.5)
Chloride: 104 mEq/L (ref 96–112)
Creatinine, Ser: 1.17 mg/dL (ref 0.40–1.50)
GFR: 73.9 mL/min (ref >60.00)
Glucose, Bld: 107 mg/dL — ABNORMAL HIGH (ref 70–99)
Potassium: 4.1 mEq/L (ref 3.5–5.1)
Sodium: 136 mEq/L (ref 135–145)

## 2020-02-17 LAB — CBC WITH DIFFERENTIAL/PLATELET
Basophils Absolute: 0.1 10*3/uL (ref 0.0–0.1)
Basophils Relative: 1.2 % (ref 0.0–3.0)
Eosinophils Absolute: 0.1 10*3/uL (ref 0.0–0.7)
Eosinophils Relative: 1 % (ref 0.0–5.0)
HCT: 51.5 % (ref 39.0–52.0)
Hemoglobin: 17.8 g/dL — ABNORMAL HIGH (ref 13.0–17.0)
Lymphocytes Relative: 21.6 % (ref 12.0–46.0)
Lymphs Abs: 1.5 10*3/uL (ref 0.7–4.0)
MCHC: 34.6 g/dL (ref 30.0–36.0)
MCV: 93 fl (ref 78.0–100.0)
Monocytes Absolute: 0.4 10*3/uL (ref 0.1–1.0)
Monocytes Relative: 6 % (ref 3.0–12.0)
Neutro Abs: 4.9 10*3/uL (ref 1.4–7.7)
Neutrophils Relative %: 70.2 % (ref 43.0–77.0)
Platelets: 215 10*3/uL (ref 150.0–400.0)
RBC: 5.54 Mil/uL (ref 4.22–5.81)
RDW: 13.1 % (ref 11.5–15.5)
WBC: 7 10*3/uL (ref 4.0–10.5)

## 2020-02-17 LAB — SARS-COV-2, NAA 2 DAY TAT

## 2020-02-17 LAB — LIPASE: Lipase: 39 U/L (ref 11.0–59.0)

## 2020-02-17 LAB — NOVEL CORONAVIRUS, NAA: SARS-CoV-2, NAA: NOT DETECTED

## 2020-02-17 LAB — SPECIMEN STATUS REPORT

## 2020-02-25 ENCOUNTER — Telehealth: Payer: Self-pay | Admitting: Primary Care

## 2020-02-25 NOTE — Telephone Encounter (Signed)
See my chart message

## 2020-03-01 ENCOUNTER — Telehealth: Payer: Self-pay | Admitting: Primary Care

## 2020-03-01 NOTE — Telephone Encounter (Signed)
Pt called in due to his job is wanting to verify the doctors note DOS 12/3 and 12/23 , I do not see the 12/23 date.

## 2020-03-01 NOTE — Telephone Encounter (Signed)
Pt notified of Dr. Tower's comments and verbalized understanding  

## 2020-03-01 NOTE — Telephone Encounter (Signed)
Please cal his job and the number is [phone] and ask for shannon walker

## 2020-03-01 NOTE — Telephone Encounter (Signed)
Pt has a virtual appointment on Thursday and wanted to know what he can take in the meantime.

## 2020-03-01 NOTE — Telephone Encounter (Signed)
appt notes only say "sick, coughing, stopped up"

## 2020-03-01 NOTE — Telephone Encounter (Signed)
Fluids and rest  Mucinex DM may help cough and congestion  Nasal saline spray  Tylenol if fever   If severe- UC or ER

## 2020-03-03 ENCOUNTER — Other Ambulatory Visit: Payer: BC Managed Care – PPO

## 2020-03-03 ENCOUNTER — Encounter: Payer: Self-pay | Admitting: Family Medicine

## 2020-03-03 ENCOUNTER — Other Ambulatory Visit: Payer: Self-pay | Admitting: Family Medicine

## 2020-03-03 ENCOUNTER — Telehealth (INDEPENDENT_AMBULATORY_CARE_PROVIDER_SITE_OTHER): Payer: BC Managed Care – PPO | Admitting: Family Medicine

## 2020-03-03 DIAGNOSIS — J069 Acute upper respiratory infection, unspecified: Secondary | ICD-10-CM

## 2020-03-03 MED ORDER — BENZONATATE 200 MG PO CAPS
200.0000 mg | ORAL_CAPSULE | Freq: Three times a day (TID) | ORAL | 1 refills | Status: DC | PRN
Start: 2020-03-03 — End: 2021-06-16

## 2020-03-03 MED ORDER — FLUTICASONE PROPIONATE 50 MCG/ACT NA SUSP: 2.0000 | Freq: Every day | NASAL | 6 refills | Status: AC

## 2020-03-03 NOTE — Progress Notes (Signed)
Virtual Visit via Telephone Note  I connected with [name] [name] on 03/03/20 at  8:30 AM EST by telephone and verified that I am speaking with the correct person using two identifiers.  Location: Patient: home Provider: office   I discussed the limitations, risks, security and privacy concerns of performing an evaluation and management service by telephone and the availability of in person appointments. I also discussed with the patient that there may be a patient responsible charge related to this service. The patient expressed understanding and agreed to proceed.   Parties involved in encounter  Patient: [name] [name]   Provider:  Loura Pardon MD   History of Present Illness: 49 yo pt of NP Carlis Abbott presents with cough and congestion  Former smoker quit in 2019- copd is on his problem list  Uses trelegy ellipta and albuterol   Symptoms- since Friday        (did not get tested for covid )  Sneezing Cough-occ phlem (clear)  Congestion -clear  No sinus pain or headache  No sore throat or ear pain   No wheeze  No tight chest -surprised by that   Some chills on and off- sat and Sunday  Aches on and off  That is better  Has not taken temp   No new GI symptoms   Otc:  Taking mucinex - DM  Tylenol cold   Has tried saline nasal spray  No steroid ns   He is not immunized for covid  Had covid   No sick contacts  Has been out of work since friday   Patient Active Problem List   Diagnosis Date Noted  . Viral URI with cough 03/03/2020  . Chronic obstructive pulmonary disease ([place]) 01/19/2020  . Strain of calf muscle 11/23/2019  . Acute bronchitis 10/12/2019  . Alcohol abuse 09/17/2019  . Nausea and vomiting 07/03/2019  . MDD (major depressive disorder) 06/25/2019  . COVID-19 virus infection 03/27/2019  . Cough 05/29/2018  . Osteoarthritis 04/03/2018  . Preventative health care 04/03/2018  . Chest pain 03/17/2018  . Smoker 09/04/2017  . Erectile dysfunction  04/11/2017  . Diarrhea 11/06/2016  . History of colonic polyps 12/28/2015  . Hepatic steatosis 05/31/2015  . Essential hypertension 07/31/2014  . Chronic knee pain 12/01/2012  . GAD (generalized anxiety disorder) 11/14/2009  . DEPENDENT EDEMA 11/09/2008  . 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  . ADHD (attention deficit hyperactivity disorder) 10/21/2006  . GERD 10/21/2006  . Morbid obesity ([place]) 07/30/2006  . OSA (obstructive sleep apnea) 07/30/2006  . Hemorrhoids 10/03/2005   Past Medical History:  Diagnosis Date  . Abdominal pain 07/31/2014  . Abnormal CT scan, small bowel 12/28/2015  . Acute bronchitis 04/09/2013  . Acute conjunctivitis of both eyes 09/04/2017  . Acute pharyngitis 04/13/2013  . ADHD (attention deficit hyperactivity disorder) 10/21/2006   Qualifier: Diagnosis of  By: Maxie Better FNP, Rosalita Levan   . AKI (acute kidney injury) ([place]) 06/26/2017  . Alternating constipation and diarrhea 10/12/2015  . ANGER 10/31/2006   Qualifier: Diagnosis of  By: Maxie Better FNP, Rosalita Levan   . ANXIETY 11/14/2009   Qualifier: Diagnosis of  By: Deborra Medina MD, Tanja Port    . Asthma   . 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 Bronchitis   . Calculus of gallbladder with acute cholecystitis without obstruction 02/15/2015  . Chest pain 03/17/2018  . Chest wall pain 02/10/2015  . Cholelithiasis 02/13/2015  .  Chronic bronchitis ([place])   . Complication of anesthesia    usually wake up in the middle of procedure or surgery  . Conjunctivitis 10/18/2010  . COPD exacerbation ([place]) 10/13/2019  . COPD with acute exacerbation ([place]) 06/26/2017  . Dehydration 06/26/2017  . DEPENDENT EDEMA 11/09/2008   Qualifier: Diagnosis of  By: Maxie Better FNP, Rosalita Levan   . Diarrhea 11/06/2016  . Enteritis 12/21/2015  . Erectile dysfunction 04/11/2017  . Gallstone 02/10/2015  . Gallstones   . GASTROENTERITIS WITHOUT DEHYDRATION 05/15/2010    Qualifier: Diagnosis of  By: Deborra Medina MD, Tanja Port    . GERD 10/21/2006   Qualifier: Diagnosis of  By: Maxie Better FNP, Rosalita Levan   . GERD (gastroesophageal reflux disease)   . Hepatic steatosis 05/31/2015  . History of colonic polyps 12/28/2015  . Hypertension   . Lumbar back sprain 01/28/2017  . Mole of skin 05/28/2012  . Morbid obesity ([place]) 07/30/2006   Qualifier: Diagnosis of  By: Maxie Better FNP, Rosalita Levan   . Nausea vomiting and diarrhea 07/31/2014  . Obesity   . OSA (obstructive sleep apnea) 07/30/2006   Qualifier: Diagnosis of  By: Maxie Better FNP, Rosalita Levan   . Partial small bowel obstruction ([place]) 12/14/2015  . Right knee pain 12/01/2012  . SBO (small bowel obstruction) ([place]) 06/26/2017  . Skin lesion 11/07/2014  . Skin tag 05/28/2012  . Sleep apnea    "suppose to wear mask but I don't" (05/31/2015)  . Small bowel obstruction ([place]) 05/31/2015  . Small bowel obstruction, partial ([place]) 12/13/2015  . Smoker 09/04/2017  . Viral gastroenteritis 03/23/2016   Past Surgical History:  Procedure Laterality Date  . CHOLECYSTECTOMY N/A 02/14/2015   Procedure: LAPAROSCOPIC CHOLECYSTECTOMY WITH INTRAOPERATIVE CHOLANGIOGRAM;  Surgeon: Coralie Keens, MD;  Location: [facility];  Service: General;  Laterality: N/A;  . COLONOSCOPY  10/16/2005   Polyps (tubular adenoma 2007), external hemorrhoids  . COLONOSCOPY WITH PROPOFOL N/A 01/05/2016   Procedure: COLONOSCOPY WITH PROPOFOL;  Surgeon: Milus Banister, MD;  Location: [facility];  Service: Endoscopy;  Laterality: N/A;  . FINGER SURGERY Left    reconstructive - 2 surgeries on hand  . LAPAROSCOPIC GASTRIC SLEEVE RESECTION  10/2010    at [facility] Use  . Smoking status: Former Smoker    Packs/day: 1.00    Years: 20.00    Pack years: 20.00    Types: Cigarettes    Start date: 10/03/2016    Quit date: 10/03/2017    Years since quitting: 2.4  . Smokeless tobacco: Current User    Types: Snuff  Vaping Use  . Vaping Use: Never  used  Substance Use Topics  . Alcohol use: Yes    Alcohol/week: 13.0 standard drinks    Types: 13 Shots of liquor per week    Comment: "a couple of shots a day"  . Drug use: No   Family History  Problem Relation Age of Onset  . Diabetes Mellitus II Mother   . Breast cancer Mother   . Ovarian cancer Mother   . Diabetes Mellitus II Father   . CAD Father   . Stroke Father   . Cancer Father        ?  . Clotting disorder Father        blood clots in brain with surgery  . Cirrhosis Maternal Grandmother   . Breast cancer Maternal Grandmother    Allergies  Allergen Reactions  . Bee Venom Anaphylaxis  . Codeine Anaphylaxis  Tolerates dilaudid  . Hornet Venom Anaphylaxis  . Penicillins Hives and Other (See Comments)    Has patient had a PCN reaction causing immediate rash, facial/tongue/throat swelling, SOB or lightheadedness with hypotensionYES Has patient had a PCN reaction causing severe rash involving mucus membranes or skin necrosis: NO Has patient had a PCN reaction that required hospitalization NO Has patient had a PCN reaction occurring within the last 10 years: NO If all of the above answers are "NO", then may proceed with Cephalosporin use.   Current Outpatient Medications on File Prior to Visit  Medication Sig Dispense Refill  . albuterol (ACCUNEB) 0.63 MG/3ML nebulizer solution USE 1 VIAL IN NEBULIZER EVERY 6 HOURS AS NEEDED FOR WHEEZING OR  SHORTNESS  OF  BREATH (Patient taking differently: Take 1 ampule by nebulization every 6 (six) hours as needed for wheezing or shortness of breath.) 90 mL 0  . albuterol (PROAIR HFA) 108 (90 Base) MCG/ACT inhaler Inhale 2 puffs into the lungs every 6 (six) hours as needed for wheezing or shortness of breath. 18 g 3  . amLODipine (NORVASC) 10 MG tablet Take 1 tablet by mouth once daily (Patient taking differently: Take 10 mg by mouth daily.) 90 tablet 1  . cetirizine (ZYRTEC) 10 MG tablet Take 10 mg by mouth daily.    Marland Kitchen escitalopram  (LEXAPRO) 10 MG tablet Take 1 tablet (10 mg total) by mouth daily. For anxiety and depression. 30 tablet 1  . Fluticasone-Umeclidin-Vilant (TRELEGY ELLIPTA) 100-62.5-25 MCG/INH AEPB Inhale 1 puff into the lungs daily. 3 each 3  . omeprazole (PRILOSEC) 20 MG capsule Take 1 capsule by mouth once daily 90 capsule 0  . ondansetron (ZOFRAN ODT) 4 MG disintegrating tablet Take 1 tablet (4 mg total) by mouth every 8 (eight) hours as needed for nausea or vomiting. 15 tablet 0  . sildenafil (REVATIO) 20 MG tablet Take 2-5 tablets by mouth 30 minutes prior to intercourse. 50 tablet 0  . sucralfate (CARAFATE) 1 g tablet Take 1 tablet (1 g total) by mouth 4 (four) times daily -  with meals and at bedtime. As needed. 120 tablet 0   Current Facility-Administered Medications on File Prior to Visit  Medication Dose Route Frequency Provider Last Rate Last Admin  . technetium tetrofosmin (TC-MYOVIEW) injection 32.6 millicurie  32.6 millicurie Intravenous Once PRN Nahser, Deloris Ping, MD       Review of Systems  Constitutional: Positive for malaise/fatigue. Negative for chills and fever.  HENT: Positive for congestion. Negative for ear pain, sinus pain and sore throat.   Eyes: Negative for blurred vision, discharge and redness.  Respiratory: Positive for cough and sputum production. Negative for shortness of breath, wheezing and stridor.   Cardiovascular: Negative for chest pain, palpitations and leg swelling.  Gastrointestinal: Negative for abdominal pain, diarrhea, nausea and vomiting.  Musculoskeletal: Negative for myalgias.  Skin: Negative for rash.  Neurological: Negative for dizziness and headaches.    Observations/Objective: Pt sounds well/in no distress Not hoarse  No cough /sob or wheezing audible  Nl cognition-good historian Nl mood   Assessment and Plan: Problem List Items Addressed This Visit      Respiratory   Viral URI with cough    Since 12/24 -now some improvement (no more aches or chills)   Disc symptom control  Sent in flonase and tessalon for congestion and cough Will continue mucinex DM Update if worse or new symptoms (ER if severe)  Watch closely for wheezing in light of copd Not covid immunized  Arranged for covid swab this afternoon-will isolate until result  Can return to work on jan 3 IF covid neg -note done          Follow Up Instructions: Drink fluids and rest  Continue mucinex DM  Try flonase nasal spray for congestion  Tessalon for additional cough control  If symptoms become suddenly severe go to the ER  The office will call  You to set up a covid swab today    I discussed the assessment and treatment plan with the patient. The patient was provided an opportunity to ask questions and all were answered. The patient agreed with the plan and demonstrated an understanding of the instructions.   The patient was advised to call back or seek an in-person evaluation if the symptoms worsen or if the condition fails to improve as anticipated.  I provided 17 minutes of non-face-to-face time during this encounter.    Loura Pardon, MD

## 2020-03-03 NOTE — Patient Instructions (Signed)
Drink fluids and rest  Continue mucinex DM  Try flonase nasal spray for congestion  Tessalon for additional cough control  If symptoms become suddenly severe go to the ER  The office will call  You to set up a covid swab today

## 2020-03-03 NOTE — Assessment & Plan Note (Signed)
Since 12/24 -now some improvement (no more aches or chills)  Disc symptom control  Sent in flonase and tessalon for congestion and cough Will continue mucinex DM Update if worse or new symptoms (ER if severe)  Watch closely for wheezing in light of copd Not covid immunized  Arranged for covid swab this afternoon-will isolate until result  Can return to work on jan 3 IF covid neg -note done

## 2020-03-03 NOTE — Telephone Encounter (Signed)
Am I able to call and give his job information?

## 2020-03-05 LAB — NOVEL CORONAVIRUS, NAA: SARS-CoV-2, NAA: NOT DETECTED

## 2020-03-05 LAB — SPECIMEN STATUS REPORT

## 2020-03-05 LAB — SARS-COV-2, NAA 2 DAY TAT

## 2020-03-06 NOTE — Telephone Encounter (Signed)
You need to have the patient give consent before providing any information to his employer. You may want to talk to [facility] or Angelica Chessman about this first.   I am very frustrated with him, he will need to be seen by GI before we can provide him with any further work notes. I believe that he may have an appointment scheduled.

## 2020-03-07 ENCOUNTER — Encounter: Payer: Self-pay | Admitting: Family Medicine

## 2020-03-07 NOTE — Telephone Encounter (Signed)
Hey can you give me a hand with this one?

## 2020-03-11 NOTE — Telephone Encounter (Signed)
Spoke with patient only requested we mail copy of work note letter in chart to home address. That has been done.

## 2020-03-11 NOTE — Telephone Encounter (Signed)
Dr. Glori Bickers, Patient wants work note extended due to cough and congestion being persistent Please advise

## 2020-03-15 ENCOUNTER — Ambulatory Visit: Payer: BC Managed Care – PPO | Admitting: Gastroenterology

## 2020-03-16 NOTE — Telephone Encounter (Signed)
I gave pt respiratory clinic info to get an appt, per Dr. Glori Bickers and this is pt's response, please review

## 2020-03-21 ENCOUNTER — Encounter: Payer: Self-pay | Admitting: Family Medicine

## 2020-03-22 ENCOUNTER — Other Ambulatory Visit: Payer: Self-pay | Admitting: Family Medicine

## 2020-03-22 DIAGNOSIS — J069 Acute upper respiratory infection, unspecified: Secondary | ICD-10-CM

## 2020-03-22 NOTE — Telephone Encounter (Signed)
Per Leafy Ro they we are testing tomorrow here at the office, appt scheduled, please put lab order in

## 2020-03-23 ENCOUNTER — Other Ambulatory Visit: Payer: Self-pay

## 2020-03-23 ENCOUNTER — Other Ambulatory Visit (INDEPENDENT_AMBULATORY_CARE_PROVIDER_SITE_OTHER): Payer: Self-pay

## 2020-03-23 DIAGNOSIS — J069 Acute upper respiratory infection, unspecified: Secondary | ICD-10-CM

## 2020-03-25 LAB — SARS-COV-2, NAA 2 DAY TAT

## 2020-03-25 LAB — SPECIMEN STATUS REPORT

## 2020-03-25 LAB — NOVEL CORONAVIRUS, NAA: SARS-CoV-2, NAA: NOT DETECTED

## 2020-04-05 ENCOUNTER — Other Ambulatory Visit (INDEPENDENT_AMBULATORY_CARE_PROVIDER_SITE_OTHER): Payer: Self-pay

## 2020-04-05 ENCOUNTER — Encounter: Payer: Self-pay | Admitting: Primary Care

## 2020-04-05 ENCOUNTER — Other Ambulatory Visit: Payer: Self-pay

## 2020-04-05 ENCOUNTER — Telehealth: Payer: Self-pay

## 2020-04-05 ENCOUNTER — Telehealth (INDEPENDENT_AMBULATORY_CARE_PROVIDER_SITE_OTHER): Payer: Self-pay | Admitting: Primary Care

## 2020-04-05 VITALS — Wt 365.0 lb

## 2020-04-05 DIAGNOSIS — K219 Gastro-esophageal reflux disease without esophagitis: Secondary | ICD-10-CM

## 2020-04-05 DIAGNOSIS — R197 Diarrhea, unspecified: Secondary | ICD-10-CM

## 2020-04-05 DIAGNOSIS — F1010 Alcohol abuse, uncomplicated: Secondary | ICD-10-CM

## 2020-04-05 DIAGNOSIS — R112 Nausea with vomiting, unspecified: Secondary | ICD-10-CM

## 2020-04-05 MED ORDER — ONDANSETRON 4 MG PO TBDP: 4.0000 mg | ORAL_TABLET | Freq: Three times a day (TID) | ORAL | 0 refills | Status: DC | PRN

## 2020-04-05 NOTE — Patient Instructions (Signed)
Please call the lab to set up a lab appointment as discussed.  Continue to stay hydrated with plenty of water.  Use the Zofran as needed for nausea.  I sent a new prescription to your pharmacy.  Please follow-up with GI as scheduled, do not cancel this appointment.  It was a pleasure to see you today! [name], NP-C

## 2020-04-05 NOTE — Assessment & Plan Note (Signed)
Compliant to omeprazole 20 mg daily, denies GERD symptoms today.

## 2020-04-05 NOTE — Progress Notes (Signed)
Subjective:    Patient ID: [name] [name], male    DOB: 08-28-1970, 50 y.o.   MRN: [id number]  HPI  Virtual Visit via Video Note  I connected with [name] on 04/05/20 at  3:00 PM EST by a video enabled telemedicine application and verified that I am speaking with the correct person using two identifiers.  Location: Patient: Home Provider: Office Participants: Patient and myself   I discussed the limitations of evaluation and management by telemedicine and the availability of in person appointments. The patient expressed understanding and agreed to proceed.  History of Present Illness:  [name] [name] is a 50 year old male with a history of recurrent nausea, vomiting, diarrhea; hemorrhoids, OSA, COPD, morbid obesity, GAD/ADHD, colonic polyps who presents today with a chief complaint of nausea, vomiting, diarrhea.   He also reports abdominal pain, dark stools that began about 1 week ago.Marland Kitchen He is experiencing 2-3 episodes of diarrhea daily now. Little appetite. He endorses "black stools for three days, last episode was 1.5 days ago. He's feeling slightly better today, but can't stop feeling nauseated and is gagging. He is drinking 4-5 bottles of water daily. His last alcoholic drink was three weeks ago.    He's been out of work intermittently throughout 2021 for various illnesses including respiratory and GI.  He was scheduled to see GI on March 15, 2020, but had to cancel his appointment due to "this viral mess". He was evaluated by Dr. Glori Bickers virtually on 03/03/2020 for viral URI with cough, tested negative for Covid.  Since that visit he has requested numerous work notes and extensions.  In fact, he has requested 29 work notes during the year 2021 up until today.  He's not taken anything for his symptoms.  He is compliant to his omeprazole 20 mg daily and denies GERD symptoms.  He is scheduled to see GI on 04/26/2020.  He underwent colonoscopy in 2017, no mention of  diverticulosis.  History of cholecystectomy.  Today he is asking for work note excuse.   Observations/Objective:  Alert and oriented. Appears well, not sickly. No distress. Speaking in complete sentences. Laying in bed.   Assessment and Plan:  Recurrent nausea, vomiting, diarrhea over the year. Unclear etiology, history of cholecystectomy, no evidence of diverticulosis on colonoscopy from 2017 or CT scan from 2021.  Checking labs today including CBC with differential, CMP, lipase, stool studies.  Strongly advised that he see GI in February as scheduled.  Refill provided for Zofran to use as needed.  Encourage plenty of water intake.  Advance diet as tolerated.  I also denied his request for a work note given his pattern of behavior with work note requests over 2021, suspicious behavior.   Follow Up Instructions:  Please call the lab to set up a lab appointment as discussed.  Continue to stay hydrated with plenty of water.  Use the Zofran as needed for nausea.  I sent a new prescription to your pharmacy.  Please follow-up with GI as scheduled, do not cancel this appointment.  It was a pleasure to see you today! [name], NP-C     I discussed the assessment and treatment plan with the patient. The patient was provided an opportunity to ask questions and all were answered. The patient agreed with the plan and demonstrated an understanding of the instructions.   The patient was advised to call back or seek an in-person evaluation if the symptoms worsen or if the condition fails to improve as anticipated.  [name] [name], NP    Review of Systems  Constitutional: Negative for chills and fever.  Gastrointestinal: Positive for abdominal pain, diarrhea, nausea and vomiting.       Past Medical History:  Diagnosis Date  . Abdominal pain 07/31/2014  . Abnormal CT scan, small bowel 12/28/2015  . Acute bronchitis 04/09/2013  . Acute conjunctivitis of both eyes 09/04/2017  .  Acute pharyngitis 04/13/2013  . ADHD (attention deficit hyperactivity disorder) 10/21/2006   Qualifier: Diagnosis of  By: Maxie Better FNP, Rosalita Levan   . AKI (acute kidney injury) ([place]) 06/26/2017  . Alternating constipation and diarrhea 10/12/2015  . ANGER 10/31/2006   Qualifier: Diagnosis of  By: Maxie Better FNP, Rosalita Levan   . ANXIETY 11/14/2009   Qualifier: Diagnosis of  By: Deborra Medina MD, Tanja Port    . Asthma   . 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 Bronchitis   . Calculus of gallbladder with acute cholecystitis without obstruction 02/15/2015  . Chest pain 03/17/2018  . Chest wall pain 02/10/2015  . Cholelithiasis 02/13/2015  . Chronic bronchitis ([place])   . Complication of anesthesia    usually wake up in the middle of procedure or surgery  . Conjunctivitis 10/18/2010  . COPD exacerbation ([place]) 10/13/2019  . COPD with acute exacerbation ([place]) 06/26/2017  . Dehydration 06/26/2017  . DEPENDENT EDEMA 11/09/2008   Qualifier: Diagnosis of  By: Maxie Better FNP, Rosalita Levan   . Diarrhea 11/06/2016  . Enteritis 12/21/2015  . Erectile dysfunction 04/11/2017  . Gallstone 02/10/2015  . Gallstones   . GASTROENTERITIS WITHOUT DEHYDRATION 05/15/2010   Qualifier: Diagnosis of  By: Deborra Medina MD, Tanja Port    . GERD 10/21/2006   Qualifier: Diagnosis of  By: Maxie Better FNP, Rosalita Levan   . GERD (gastroesophageal reflux disease)   . Hepatic steatosis 05/31/2015  . History of colonic polyps 12/28/2015  . Hypertension   . Lumbar back sprain 01/28/2017  . Mole of skin 05/28/2012  . Morbid obesity ([place]) 07/30/2006   Qualifier: Diagnosis of  By: Maxie Better FNP, Rosalita Levan   . Nausea vomiting and diarrhea 07/31/2014  . Obesity   . OSA (obstructive sleep apnea) 07/30/2006   Qualifier: Diagnosis of  By: Maxie Better FNP, Rosalita Levan   . Partial small bowel obstruction ([place]) 12/14/2015  . Right knee pain 12/01/2012  . SBO (small bowel obstruction) ([place])  06/26/2017  . Skin lesion 11/07/2014  . Skin tag 05/28/2012  . Sleep apnea    "suppose to wear mask but I don't" (05/31/2015)  . Small bowel obstruction ([place]) 05/31/2015  . Small bowel obstruction, partial ([place]) 12/13/2015  . Smoker 09/04/2017  . Viral gastroenteritis 03/23/2016     Social History   Socioeconomic History  . Marital status: Legally Separated    Spouse name: Not on file  . Number of children: 1  . Years of education: Not on file  . Highest education level: Not on file  Occupational History  . Occupation: Publishing rights manager: [employer]  Tobacco Use  . Smoking status: Former Smoker    Packs/day: 1.00    Years: 20.00    Pack years: 20.00    Types: Cigarettes    Start date: 10/03/2016    Quit date: 10/03/2017    Years since quitting: 2.5  . Smokeless tobacco: Current User    Types: Snuff  Vaping Use  . Vaping Use: Never used  Substance and Sexual Activity  .  Alcohol use: Yes    Alcohol/week: 13.0 standard drinks    Types: 13 Shots of liquor per week    Comment: "a couple of shots a day"  . Drug use: No  . Sexual activity: Yes  Other Topics Concern  . Not on file  Social History Narrative  . Not on file   Social Determinants of Health   Financial Resource Strain: Not on file  Food Insecurity: Not on file  Transportation Needs: Not on file  Physical Activity: Not on file  Stress: Not on file  Social Connections: Not on file  Intimate Partner Violence: Not on file    Past Surgical History:  Procedure Laterality Date  . CHOLECYSTECTOMY N/A 02/14/2015   Procedure: LAPAROSCOPIC CHOLECYSTECTOMY WITH INTRAOPERATIVE CHOLANGIOGRAM;  Surgeon: Abigail Miyamoto, MD;  Location: [facility] OR;  Service: General;  Laterality: N/A;  . COLONOSCOPY  10/16/2005   Polyps (tubular adenoma 2007), external hemorrhoids  . COLONOSCOPY WITH PROPOFOL N/A 01/05/2016   Procedure: COLONOSCOPY WITH PROPOFOL;  Surgeon: Rachael Fee, MD;  Location: [facility];  Service:  Endoscopy;  Laterality: N/A;  . FINGER SURGERY Left    reconstructive - 2 surgeries on hand  . LAPAROSCOPIC GASTRIC SLEEVE RESECTION  10/2010    at [facility] History  Problem Relation Age of Onset  . Diabetes Mellitus II Mother   . Breast cancer Mother   . Ovarian cancer Mother   . Diabetes Mellitus II Father   . CAD Father   . Stroke Father   . Cancer Father        ?  . Clotting disorder Father        blood clots in brain with surgery  . Cirrhosis Maternal Grandmother   . Breast cancer Maternal Grandmother     Allergies  Allergen Reactions  . Bee Venom Anaphylaxis  . Codeine Anaphylaxis    Tolerates dilaudid  . Hornet Venom Anaphylaxis  . Penicillins Hives and Other (See Comments)    Has patient had a PCN reaction causing immediate rash, facial/tongue/throat swelling, SOB or lightheadedness with hypotensionYES Has patient had a PCN reaction causing severe rash involving mucus membranes or skin necrosis: NO Has patient had a PCN reaction that required hospitalization NO Has patient had a PCN reaction occurring within the last 10 years: NO If all of the above answers are "NO", then may proceed with Cephalosporin use.    Current Outpatient Medications on File Prior to Visit  Medication Sig Dispense Refill  . albuterol (ACCUNEB) 0.63 MG/3ML nebulizer solution USE 1 VIAL IN NEBULIZER EVERY 6 HOURS AS NEEDED FOR WHEEZING OR  SHORTNESS  OF  BREATH (Patient taking differently: Take 1 ampule by nebulization every 6 (six) hours as needed for wheezing or shortness of breath.) 90 mL 0  . albuterol (PROAIR HFA) 108 (90 Base) MCG/ACT inhaler Inhale 2 puffs into the lungs every 6 (six) hours as needed for wheezing or shortness of breath. 18 g 3  . amLODipine (NORVASC) 10 MG tablet Take 1 tablet by mouth once daily (Patient taking differently: Take 10 mg by mouth daily.) 90 tablet 1  . benzonatate (TESSALON) 200 MG capsule Take 1 capsule (200 mg total) by mouth 3 (three) times daily as  needed for cough. Swallow whole, do not bite pill 30 capsule 1  . cetirizine (ZYRTEC) 10 MG tablet Take 10 mg by mouth daily.    Marland Kitchen escitalopram (LEXAPRO) 10 MG tablet Take 1 tablet (10 mg total) by mouth daily. For  anxiety and depression. 30 tablet 1  . fluticasone (FLONASE) 50 MCG/ACT nasal spray Place 2 sprays into both nostrils daily. 16 g 6  . Fluticasone-Umeclidin-Vilant (TRELEGY ELLIPTA) 100-62.5-25 MCG/INH AEPB Inhale 1 puff into the lungs daily. 3 each 3  . omeprazole (PRILOSEC) 20 MG capsule Take 1 capsule by mouth once daily 90 capsule 0  . sildenafil (REVATIO) 20 MG tablet Take 2-5 tablets by mouth 30 minutes prior to intercourse. 50 tablet 0   Current Facility-Administered Medications on File Prior to Visit  Medication Dose Route Frequency Provider Last Rate Last Admin  . technetium tetrofosmin (TC-MYOVIEW) injection 81.4 millicurie  48.1 millicurie Intravenous Once PRN Nahser, Wonda Cheng, MD        Wt (!) 365 lb (165.6 kg) Comment: pt reported  BMI 46.86 kg/m    Objective:   Physical Exam Constitutional:      General: He is not in acute distress.    Appearance: He is not ill-appearing.  Pulmonary:     Effort: Pulmonary effort is normal.  Neurological:     Mental Status: He is alert and oriented to person, place, and time.  Psychiatric:        Mood and Affect: Mood normal.            Assessment & Plan:

## 2020-04-05 NOTE — Assessment & Plan Note (Signed)
Patient denies alcoholic beverage within the last 3 weeks.

## 2020-04-05 NOTE — Assessment & Plan Note (Signed)
Recurrent nausea, vomiting, diarrhea over the year. Unclear etiology, history of cholecystectomy, no evidence of diverticulosis on colonoscopy from 2017 or CT scan from 2021.  Checking labs today including CBC with differential, CMP, lipase, stool studies.  Strongly advised that he see GI in February as scheduled.  Refill provided for Zofran to use as needed.  Encourage plenty of water intake.  Advance diet as tolerated.  I also denied his request for a work note given his pattern of behavior with work note requests over 2021, suspicious behavior.

## 2020-04-05 NOTE — Telephone Encounter (Signed)
Pt came for car-side lab draw per PCP due to symptoms pt was having. When this nurse met the pt at the vehicle the pt inquired if the clinic had a portable EKG machine. Inquired why he would want to know that. Pt reported he was having chest pain. He stated "this is different than normal, it hurts worse."  Pt reported pain was in the center of the chest and was pressure-like, not stabbing and started about 1-2 minutes ago driving to the clinic. 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 Pt denies SOB, difficulty breathing, or any other symptoms. Pt did not appear to be under any duress. No SOB, flushing, or diaphoresis. Pt reported he has a hx of angina and this has happened in the past a lot but this time it felt different and the pain more severe than normal. Pt reports he has gone to the ER for this in the past. Called other RN in the clinic with cell phone since pt was in the back parking lot and did not want pt left alone. Asked to bring BP cuff and pulse ox. Anisha, RN, brought out BP cuff and pulse ox and assisted with observation of pt. BP taken twice; 164/98 and 158/94 with HR in the mid to upper 90s and O2 sat 99%.   After 3-4 minutes pt reported he was no longer having chest pain and was feeling fine. Labs were drawn on pt without incident and pt again reported he was no longer having any chest pain. Pt reported he was ok to drive. Inquired if pt knew what to do if he had chest pain again.  Pt stated "call 911." Advised pt that is correct and also educated pt on other signs of heart attack. Pt verbalized understanding.

## 2020-04-06 ENCOUNTER — Telehealth: Payer: Self-pay

## 2020-04-06 LAB — CBC WITH DIFFERENTIAL/PLATELET
Basophils Absolute: 0.1 10*3/uL (ref 0.0–0.1)
Basophils Relative: 1.2 % (ref 0.0–3.0)
Eosinophils Absolute: 0.1 10*3/uL (ref 0.0–0.7)
Eosinophils Relative: 1 % (ref 0.0–5.0)
HCT: 53.3 % — ABNORMAL HIGH (ref 39.0–52.0)
Hemoglobin: 18.3 g/dL (ref 13.0–17.0)
Lymphocytes Relative: 20.2 % (ref 12.0–46.0)
Lymphs Abs: 1.5 10*3/uL (ref 0.7–4.0)
MCHC: 34.4 g/dL (ref 30.0–36.0)
MCV: 94 fl (ref 78.0–100.0)
Monocytes Absolute: 0.5 10*3/uL (ref 0.1–1.0)
Monocytes Relative: 6.8 % (ref 3.0–12.0)
Neutro Abs: 5.1 10*3/uL (ref 1.4–7.7)
Neutrophils Relative %: 70.8 % (ref 43.0–77.0)
Platelets: 200 10*3/uL (ref 150.0–400.0)
RBC: 5.67 Mil/uL (ref 4.22–5.81)
RDW: 14.3 % (ref 11.5–15.5)
WBC: 7.2 10*3/uL (ref 4.0–10.5)

## 2020-04-06 LAB — COMPREHENSIVE METABOLIC PANEL
ALT: 32 U/L (ref 0–53)
AST: 27 U/L (ref 0–37)
Albumin: 4.2 g/dL (ref 3.5–5.2)
Alkaline Phosphatase: 60 U/L (ref 39–117)
BUN: 10 mg/dL (ref 6–23)
CO2: 26 mEq/L (ref 19–32)
Calcium: 9.2 mg/dL (ref 8.4–10.5)
Chloride: 102 mEq/L (ref 96–112)
Creatinine, Ser: 1.24 mg/dL (ref 0.40–1.50)
GFR: 68.86 mL/min (ref >60.00)
Glucose, Bld: 89 mg/dL (ref 70–99)
Potassium: 4.1 mEq/L (ref 3.5–5.1)
Sodium: 136 mEq/L (ref 135–145)
Total Bilirubin: 0.6 mg/dL (ref 0.2–1.2)
Total Protein: 7.2 g/dL (ref 6.0–8.3)

## 2020-04-06 LAB — LIPASE: Lipase: 25 U/L (ref 11.0–59.0)

## 2020-04-06 NOTE — Telephone Encounter (Signed)
Noted. He actually messaged me later that evening asking for a note to return to work. Thank you for evaluating.

## 2020-04-06 NOTE — Telephone Encounter (Addendum)
Noted.  [name] [name], please call patient and ask him if he is injecting testosterone? If so, who is his urologist?  He will need to notify them of his recent hemoglobin level of 18.3.  If he is not injecting testosterone, is using his sleep apnea machine? Is he smoking?

## 2020-04-06 NOTE — Telephone Encounter (Signed)
[facility] called critical results @ 1040  Hemoglobin 18.3

## 2020-04-08 NOTE — Telephone Encounter (Signed)
Called and spoke with patient who stated that he has not been injecting testosterone and that he has not been wearing his CPAP machine at night. Patient stated that he is a smoker and currently smokes a pack per day.

## 2020-04-08 NOTE — Telephone Encounter (Signed)
Noted, I suspect his elevated hemoglobin is secondary to tobacco abuse. We will continue to monitor.

## 2020-04-11 NOTE — Telephone Encounter (Signed)
Called patient reviewed all information and repeated back to me. Will call if any questions.  ? ?

## 2020-04-26 ENCOUNTER — Other Ambulatory Visit: Payer: Self-pay

## 2020-04-26 ENCOUNTER — Ambulatory Visit: Payer: Self-pay | Admitting: Gastroenterology

## 2020-04-26 ENCOUNTER — Encounter: Payer: Self-pay | Admitting: Internal Medicine

## 2020-04-26 ENCOUNTER — Ambulatory Visit (INDEPENDENT_AMBULATORY_CARE_PROVIDER_SITE_OTHER)
Admission: RE | Admit: 2020-04-26 | Discharge: 2020-04-26 | Disposition: A | Discharge status: Home / Self Care | Payer: Self-pay | Source: Ambulatory Visit | Attending: Internal Medicine | Admitting: Internal Medicine

## 2020-04-26 ENCOUNTER — Ambulatory Visit (INDEPENDENT_AMBULATORY_CARE_PROVIDER_SITE_OTHER): Payer: Self-pay | Admitting: Internal Medicine

## 2020-04-26 VITALS — BP 118/84 | HR 82 | Temp 97.2°F | Ht 74.0 in | Wt 381.0 lb

## 2020-04-26 DIAGNOSIS — M545 Low back pain, unspecified: Secondary | ICD-10-CM

## 2020-04-26 NOTE — Progress Notes (Signed)
Subjective:    Patient ID: [name] [name], male    DOB: May 14, 1970, 50 y.o.   MRN: [id number]  HPI Here due to back and abdominal pain This visit occurred during the SARS-CoV-2 public health emergency.  Safety protocols were in place, including screening questions prior to the visit, additional usage of staff PPE, and extensive cleaning of exam room while observing appropriate contact time as indicated for disinfecting solutions.   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 albuterol (ACCUNEB) 0.63 MG/3ML nebulizer solution USE 1 VIAL IN NEBULIZER EVERY 6 HOURS AS NEEDED FOR WHEEZING OR  SHORTNESS  OF  BREATH (Patient taking differently: Take 1 ampule by nebulization every 6 (six) hours as needed for wheezing or shortness of breath.) 90 mL 0  . albuterol (PROAIR HFA) 108 (90 Base) MCG/ACT inhaler Inhale 2 puffs into the lungs every 6 (six) hours as needed for wheezing or shortness of breath. 18 g 3  . amLODipine (NORVASC) 10 MG tablet Take 1 tablet by mouth once daily (Patient taking differently: Take 10 mg by mouth daily.) 90 tablet 1  . benzonatate (TESSALON) 200 MG capsule Take 1 capsule (200 mg total) by mouth  3 (three) times daily as needed for cough. Swallow whole, do not bite pill 30 capsule 1  . cetirizine (ZYRTEC) 10 MG tablet Take 10 mg by mouth daily.    Marland Kitchen escitalopram (LEXAPRO) 10 MG tablet Take 1 tablet (10 mg total) by mouth daily. For anxiety and depression. 30 tablet 1  . fluticasone (FLONASE) 50 MCG/ACT nasal spray Place 2 sprays into both nostrils daily. 16 g 6  . Fluticasone-Umeclidin-Vilant (TRELEGY ELLIPTA) 100-62.5-25 MCG/INH AEPB Inhale 1 puff into the lungs daily. 3 each 3  . omeprazole (PRILOSEC) 20 MG capsule Take 1 capsule by mouth once daily 90 capsule 0  . ondansetron (ZOFRAN ODT) 4 MG disintegrating tablet Take 1 tablet (4 mg total) by mouth every 8 (eight) hours as needed for nausea or vomiting. 15 tablet 0  . sildenafil (REVATIO) 20 MG tablet Take 2-5 tablets by mouth 30 minutes prior to intercourse. 50 tablet 0   Current Facility-Administered Medications on File Prior to Visit  Medication Dose Route Frequency Provider Last Rate Last Admin  . technetium tetrofosmin (TC-MYOVIEW) injection 24.2 millicurie  35.3 millicurie Intravenous Once PRN Nahser, Wonda Cheng, MD        Allergies  Allergen Reactions  . Bee Venom Anaphylaxis  . Codeine Anaphylaxis    Tolerates dilaudid  . Hornet Venom Anaphylaxis  . Penicillins Hives and Other (See Comments)    Has patient had a PCN reaction causing immediate rash, facial/tongue/throat swelling, SOB or lightheadedness with hypotensionYES Has patient had a PCN reaction  causing severe rash involving mucus membranes or skin necrosis: NO Has patient had a PCN reaction that required hospitalization NO Has patient had a PCN reaction occurring within the last 10 years: NO If all of the above answers are "NO", then may proceed with Cephalosporin use.    Past Medical History:  Diagnosis Date  . Abdominal pain 07/31/2014  . Abnormal CT scan, small bowel 12/28/2015  . Acute bronchitis 04/09/2013  . Acute conjunctivitis of both eyes 09/04/2017  .  Acute pharyngitis 04/13/2013  . ADHD (attention deficit hyperactivity disorder) 10/21/2006   Qualifier: Diagnosis of  By: Maxie Better FNP, Rosalita Levan   . AKI (acute kidney injury) ([place]) 06/26/2017  . Alternating constipation and diarrhea 10/12/2015  . ANGER 10/31/2006   Qualifier: Diagnosis of  By: Maxie Better FNP, Rosalita Levan   . ANXIETY 11/14/2009   Qualifier: Diagnosis of  By: Deborra Medina MD, Tanja Port    . Asthma   . 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 Bronchitis   . Calculus of gallbladder with acute cholecystitis without obstruction 02/15/2015  . Chest pain 03/17/2018  . Chest wall pain 02/10/2015  . Cholelithiasis 02/13/2015  . Chronic bronchitis ([place])   . Complication of anesthesia    usually wake up in the middle of procedure or surgery  . Conjunctivitis 10/18/2010  . COPD exacerbation ([place]) 10/13/2019  . COPD with acute exacerbation ([place]) 06/26/2017  . Dehydration 06/26/2017  . DEPENDENT EDEMA 11/09/2008   Qualifier: Diagnosis of  By: Maxie Better FNP, Rosalita Levan   . Diarrhea 11/06/2016  . Enteritis 12/21/2015  . Erectile dysfunction 04/11/2017  . Gallstone 02/10/2015  . Gallstones   . GASTROENTERITIS WITHOUT DEHYDRATION 05/15/2010   Qualifier: Diagnosis of  By: Deborra Medina MD, Tanja Port    . GERD 10/21/2006   Qualifier: Diagnosis of  By: Maxie Better FNP, Rosalita Levan   . GERD (gastroesophageal reflux disease)   . Hepatic steatosis 05/31/2015  . History of colonic polyps 12/28/2015  . Hypertension   . Lumbar back sprain 01/28/2017  . Mole of skin 05/28/2012  . Morbid obesity ([place]) 07/30/2006   Qualifier: Diagnosis of  By: Maxie Better FNP, Rosalita Levan   . Nausea vomiting and diarrhea 07/31/2014  . Obesity   . OSA (obstructive sleep apnea) 07/30/2006   Qualifier: Diagnosis of  By: Maxie Better FNP, Rosalita Levan   . Partial small bowel obstruction ([place]) 12/14/2015  . Right knee pain 12/01/2012  . SBO (small bowel obstruction) ([place])  06/26/2017  . Skin lesion 11/07/2014  . Skin tag 05/28/2012  . Sleep apnea    "suppose to wear mask but I don't" (05/31/2015)  . Small bowel obstruction ([place]) 05/31/2015  . Small bowel obstruction, partial ([place]) 12/13/2015  . Smoker 09/04/2017  . Viral gastroenteritis 03/23/2016    Past Surgical History:  Procedure Laterality Date  . CHOLECYSTECTOMY N/A 02/14/2015   Procedure: LAPAROSCOPIC CHOLECYSTECTOMY WITH INTRAOPERATIVE CHOLANGIOGRAM;  Surgeon: Coralie Keens, MD;  Location: [facility];  Service: General;  Laterality: N/A;  . COLONOSCOPY  10/16/2005   Polyps (tubular adenoma 2007), external hemorrhoids  . COLONOSCOPY WITH PROPOFOL N/A 01/05/2016   Procedure: COLONOSCOPY WITH PROPOFOL;  Surgeon: Milus Banister, MD;  Location: [facility];  Service: Endoscopy;  Laterality: N/A;  . FINGER SURGERY Left    reconstructive - 2 surgeries on hand  . LAPAROSCOPIC GASTRIC SLEEVE RESECTION  10/2010    at [facility] History  Problem Relation Age of Onset  .  Diabetes Mellitus II Mother   . Breast cancer Mother   . Ovarian cancer Mother   . Diabetes Mellitus II Father   . CAD Father   . Stroke Father   . Cancer Father        ?  . Clotting disorder Father        blood clots in brain with surgery  . Cirrhosis Maternal Grandmother   . Breast cancer Maternal Grandmother     Social History   Socioeconomic History  . Marital status: Legally Separated    Spouse name: Not on file  . Number of children: 1  . Years of education: Not on file  . Highest education level: Not on file  Occupational History  . Occupation: Publishing rights manager: [employer]  Tobacco Use  . Smoking status: Former Smoker    Packs/day: 1.00    Years: 20.00    Pack years: 20.00    Types: Cigarettes    Start date: 10/03/2016    Quit date: 10/03/2017    Years since quitting: 2.5  . Smokeless tobacco: Current User    Types: Snuff  Vaping Use  . Vaping Use: Never used  Substance and Sexual Activity   . Alcohol use: Yes    Alcohol/week: 13.0 standard drinks    Types: 13 Shots of liquor per week    Comment: "a couple of shots a day"  . Drug use: No  . Sexual activity: Yes  Other Topics Concern  . Not on file  Social History Narrative  . Not on file   Social Determinants of Health   Financial Resource Strain: Not on file  Food Insecurity: Not on file  Transportation Needs: Not on file  Physical Activity: Not on file  Stress: Not on file  Social Connections: Not on file  Intimate Partner Violence: Not on file   Review of Systems     Objective:   Physical Exam Musculoskeletal:     Comments: Exquisite tenderness focally ~L1 spine Resists all SLR Limited internal rotation both hips  Neurological:     Comments: Gait is slow but not antalgic Reduced strength in hip abductors bilaterally (trouble lifting legs)--but okay at knees            Assessment & Plan:

## 2020-04-26 NOTE — Assessment & Plan Note (Signed)
Severe and very localized The leg symptoms don't make sense with the pain ~L1 Will check x-ray of lumbar spine because of the focal tenderness Set up with Dr Lorelei Pont ASAP to try to figure this out

## 2020-04-28 ENCOUNTER — Ambulatory Visit: Payer: Self-pay | Admitting: Family Medicine

## 2020-06-28 ENCOUNTER — Other Ambulatory Visit: Payer: Self-pay | Admitting: Primary Care

## 2020-06-28 DIAGNOSIS — I10 Essential (primary) hypertension: Secondary | ICD-10-CM

## 2020-06-28 DIAGNOSIS — K219 Gastro-esophageal reflux disease without esophagitis: Secondary | ICD-10-CM

## 2021-01-20 ENCOUNTER — Other Ambulatory Visit: Payer: Self-pay | Admitting: Primary Care

## 2021-01-20 DIAGNOSIS — I10 Essential (primary) hypertension: Secondary | ICD-10-CM

## 2021-01-20 NOTE — Telephone Encounter (Signed)
Please call patient:  It looks like he may have run out of his amlodipine around July 2022.  He is also due for an office visit for follow-up.  1.  Has he been out of amlodipine for the last several months? What are his blood pressure readings 2.  Please set up for an office visit, in person.

## 2021-01-25 NOTE — Telephone Encounter (Signed)
Called patient he has been out of meds that long. He lost job and did not have money or insurance. Getting ready to start new job but they will not let him work if he can not pass physical and with his blood pressure he knows he can't. Recommended that he make a visit. Patient declined does not have money. Is there a income based clinic he can go to ? Let him know I would give you message and you will address after getting back in the office. He will call if any questions.

## 2021-01-25 NOTE — Telephone Encounter (Signed)
I can provide him with a short term supply of amlodipine (30 pills with 1 refill) but he will need an office visit for refills.  Where is he getting this "physical" from?   [facility] has a discount program, please look into this for him. Please also schedule an office visit for January 2023.

## 2021-02-07 NOTE — Telephone Encounter (Signed)
Left message to return call to our office.  

## 2021-02-07 NOTE — Telephone Encounter (Signed)
Patient called back. Was pre employment physical for [employer]. He was able to pass after he got refill. I have made follow up for patient in January. He will call if any issues with date. Aware no refills if he does not keep follow up. I have mailed to verified home address the [facility] application. Patient will fill out an send in per directions on application.

## 2021-02-07 NOTE — Telephone Encounter (Signed)
Noted and appreciate the follow up with this.

## 2021-03-28 ENCOUNTER — Ambulatory Visit: Payer: Self-pay | Admitting: Primary Care

## 2021-05-15 ENCOUNTER — Encounter: Payer: Self-pay | Admitting: Gastroenterology

## 2021-06-16 ENCOUNTER — Ambulatory Visit (INDEPENDENT_AMBULATORY_CARE_PROVIDER_SITE_OTHER): Payer: Self-pay | Admitting: Family Medicine

## 2021-06-16 ENCOUNTER — Encounter: Payer: Self-pay | Admitting: Family Medicine

## 2021-06-16 VITALS — BP 160/118 | HR 87 | Temp 97.6°F | Ht 74.0 in | Wt 371.1 lb

## 2021-06-16 DIAGNOSIS — S8992XA Unspecified injury of left lower leg, initial encounter: Secondary | ICD-10-CM

## 2021-06-16 DIAGNOSIS — I10 Essential (primary) hypertension: Secondary | ICD-10-CM

## 2021-06-16 DIAGNOSIS — F17200 Nicotine dependence, unspecified, uncomplicated: Secondary | ICD-10-CM

## 2021-06-16 MED ORDER — AMLODIPINE BESYLATE 10 MG PO TABS: 10.0000 mg | ORAL_TABLET | Freq: Every day | ORAL | 0 refills | Status: DC

## 2021-06-16 NOTE — Patient Instructions (Signed)
Letter for work provided today. ?BP too high - restart amlodipine '10mg'$  daily. ?Schedule follow up with PCP in the next 1-2 months. ?Start monitoring blood pressure at home after taking BP med regularly, let us know if persistently >140/90.  ?

## 2021-06-16 NOTE — Progress Notes (Signed)
? ? Patient ID: [name] [name], male    DOB: 1970-06-30, 51 y.o.   MRN: [id number] ? ?This visit was conducted in person. ? ?BP (!) 160/118 (BP Location: Right Arm, Cuff Size: Large)   Pulse 87   Temp 97.6 ?F (36.4 ?C) (Temporal)   Ht '6\' 2"'$  (1.88 m)   Wt (!) 371 lb 2 oz (168.3 kg)   SpO2 97%   BMI 47.65 kg/m?   ? ?BP Readings from Last 3 Encounters:  ?06/16/21 (!) 160/118  ?04/26/20 118/84  ?01/19/20 (!) 154/88  ? ?CC: requesting medical clearance ?Subjective:  ? ?HPI: ?[name] [name] is a 51 y.o. male presenting on 06/16/2021 for Medical Clearance (Needs medical clearance to return to work after L knee injury, 05/24/21.  Had a text visit with a doctor.  Has been on light duty so needs a doctor's note to return to work. ) ? ? ?Patient of [name] NP, last saw our office 04/2020 due to lapse in insurance/between jobs.  ? ?Here to discuss injury sustained at work. ?Injured left knee March 22nd - getting out of a unit while driving a work vehicle. Felt popping sensation then burn to posterior proximal calf.  ?Next day he did telephone call with [employer] through workers comp per work recommendations. Followed up via text with [employer] - advised to be seen in person. He states work was unable to find him a place to be evaluated "trouble finding someone who could cover his insurance".  ?Then told worker's comp case denied, but he still needed full clearance to return to work.  ?Treated supportively with ice and leg elevation. It now feels fully better.  ? ?He's continued working throughout all of this, light duty (max 8 hours/day).  ?Asking for full clearance to return to work.  ?Normally has a very sedentary job, without heavy lifting or repeated bending stooping twisting.  ? ?HTN -normally amlodipine 10 mg daily-he's been out of med for months.  ?Known COPD, continued 1 ppd smoker ?   ? ?Relevant past medical, surgical, family and social history reviewed and updated as indicated. Interim medical history since our  last visit reviewed. ?Allergies and medications reviewed and updated. ?Outpatient Medications Prior to Visit  ?Medication Sig Dispense Refill  ? cetirizine (ZYRTEC) 10 MG tablet Take 10 mg by mouth daily.    ? omeprazole (PRILOSEC) 20 MG capsule Take 1 capsule (20 mg total) by mouth daily. For heartburn 90 capsule 1  ? albuterol (ACCUNEB) 0.63 MG/3ML nebulizer solution USE 1 VIAL IN NEBULIZER EVERY 6 HOURS AS NEEDED FOR WHEEZING OR  SHORTNESS  OF  BREATH (Patient not taking: Reported on 06/16/2021) 90 mL 0  ? albuterol (PROAIR HFA) 108 (90 Base) MCG/ACT inhaler Inhale 2 puffs into the lungs every 6 (six) hours as needed for wheezing or shortness of breath. (Patient not taking: Reported on 06/16/2021) 18 g 3  ? escitalopram (LEXAPRO) 10 MG tablet Take 1 tablet (10 mg total) by mouth daily. For anxiety and depression. (Patient not taking: Reported on 06/16/2021) 30 tablet 1  ? fluticasone (FLONASE) 50 MCG/ACT nasal spray Place 2 sprays into both nostrils daily. (Patient not taking: Reported on 06/16/2021) 16 g 6  ? Fluticasone-Umeclidin-Vilant (TRELEGY ELLIPTA) 100-62.5-25 MCG/INH AEPB Inhale 1 puff into the lungs daily. (Patient not taking: Reported on 06/16/2021) 3 each 3  ? ondansetron (ZOFRAN ODT) 4 MG disintegrating tablet Take 1 tablet (4 mg total) by mouth every 8 (eight) hours as needed for nausea or vomiting. (Patient not  taking: Reported on 06/16/2021) 15 tablet 0  ? sildenafil (REVATIO) 20 MG tablet Take 2-5 tablets by mouth 30 minutes prior to intercourse. (Patient not taking: Reported on 06/16/2021) 50 tablet 0  ? amLODipine (NORVASC) 10 MG tablet Take 1 tablet (10 mg total) by mouth daily. for blood pressure. Office visit required for further refills. (Patient not taking: Reported on 06/16/2021) 30 tablet 1  ? benzonatate (TESSALON) 200 MG capsule Take 1 capsule (200 mg total) by mouth 3 (three) times daily as needed for cough. Swallow whole, do not bite pill 30 capsule 1  ? ?Facility-Administered Medications  Prior to Visit  ?Medication Dose Route Frequency Provider Last Rate Last Admin  ? technetium tetrofosmin (TC-MYOVIEW) injection 35.3 millicurie  29.9 millicurie Intravenous Once PRN Nahser, Wonda Cheng, MD      ?  ? ?Per HPI unless specifically indicated in ROS section below ?Review of Systems ? ?Objective:  ?BP (!) 160/118 (BP Location: Right Arm, Cuff Size: Large)   Pulse 87   Temp 97.6 ?F (36.4 ?C) (Temporal)   Ht '6\' 2"'$  (1.88 m)   Wt (!) 371 lb 2 oz (168.3 kg)   SpO2 97%   BMI 47.65 kg/m?   ?Wt Readings from Last 3 Encounters:  ?06/16/21 (!) 371 lb 2 oz (168.3 kg)  ?04/26/20 (!) 381 lb (172.8 kg)  ?04/05/20 (!) 365 lb (165.6 kg)  ?  ?  ?Physical Exam ?Vitals and nursing note reviewed.  ?Constitutional:   ?   Appearance: Normal appearance. He is obese. He is not ill-appearing.  ?Cardiovascular:  ?   Rate and Rhythm: Normal rate and regular rhythm.  ?   Pulses: Normal pulses.  ?   Heart sounds: Normal heart sounds. No murmur heard. ?Pulmonary:  ?   Effort: Pulmonary effort is normal. No respiratory distress.  ?   Breath sounds: Normal breath sounds. No wheezing, rhonchi or rales.  ?Musculoskeletal:  ?   Right lower leg: No edema.  ?   Left lower leg: No edema.  ?   Comments:  ?Bilateral knee exam: ?No deformity on inspection. ?No pain with palpation of knee landmarks. ?No effusion/swelling noted. ?FROM in flex/extension without crepitus. ?No popliteal fullness. ?Neg drawer test. ?Neg mcmurray test. ?No pain with valgus/varus stress. ?No PFgrind. ?No abnormal patellar mobility.   ?Skin: ?   General: Skin is warm and dry.  ?   Findings: No rash.  ?Neurological:  ?   Mental Status: He is alert.  ?Psychiatric:     ?   Mood and Affect: Mood normal.     ?   Behavior: Behavior normal.  ? ?   ? ?Assessment & Plan:  ? ?Problem List Items Addressed This Visit   ? ? Essential hypertension  ?  Blood pressure significantly elevated today in setting of running out of antihypertensive months ago.  I will refill amlodipine 10  mg daily and ask him to schedule a follow-up with his PCP within the next few months.  I also asked him to monitor blood pressures at home and let us know if consistently high despite amlodipine recommencement.  He states he will buy new BP cuff to monitor at home. ? ?  ?  ? Relevant Medications  ? amLODipine (NORVASC) 10 MG tablet  ? Smoker  ?  Endorses history of COPD, continued smoking 1 pack/day.  Encouraged full cessation. ? ?  ?  ? Left knee injury, initial encounter - Primary  ?  Exam of the left knee  today is overall normal, and I am unable to reproduce any knee discomfort on testing.  Anticipate he had a calf strain that has since healed. Given he has an overall sedentary job, anticipate sufficiently healed to clear him for full duty.  I did ask him to seek further evaluation if recurrent knee pain.  Patient agrees with plan.  Letter for work provided today. ? ?  ?  ?  ? ?Meds ordered this encounter  ?Medications  ? amLODipine (NORVASC) 10 MG tablet  ?  Sig: Take 1 tablet (10 mg total) by mouth daily. For blood pressure  ?  Dispense:  90 tablet  ?  Refill:  0  ?  Schedule CPE prior to more refills  ? ?No orders of the defined types were placed in this encounter. ? ? ? ?Patient Instructions  ?Letter for work provided today. ?BP too high - restart amlodipine '10mg'$  daily. ?Schedule follow up with PCP in the next 1-2 months. ?Start monitoring blood pressure at home after taking BP med regularly, let us know if persistently >140/90.  ? ?Follow up plan: ?Return in about 2 months (around 08/16/2021), or if symptoms worsen or fail to improve, for follow up visit. ? ?Ria Bush, MD   ?

## 2021-06-17 DIAGNOSIS — S8992XA Unspecified injury of left lower leg, initial encounter: Secondary | ICD-10-CM | POA: Insufficient documentation

## 2021-06-17 HISTORY — DX: Unspecified injury of left lower leg, initial encounter: S89.92XA

## 2021-06-17 NOTE — Assessment & Plan Note (Signed)
Exam of the left knee today is overall normal, and I am unable to reproduce any knee discomfort on testing.  Anticipate he had a calf strain that has since healed. Given he has an overall sedentary job, anticipate sufficiently healed to clear him for full duty.  I did ask him to seek further evaluation if recurrent knee pain.  Patient agrees with plan.  Letter for work provided today. ?

## 2021-06-17 NOTE — Assessment & Plan Note (Signed)
Endorses history of COPD, continued smoking 1 pack/day.  Encouraged full cessation. ?

## 2021-06-17 NOTE — Assessment & Plan Note (Addendum)
Blood pressure significantly elevated today in setting of running out of antihypertensive months ago.  I will refill amlodipine 10 mg daily and ask him to schedule a follow-up with his PCP within the next few months.  I also asked him to monitor blood pressures at home and let us know if consistently high despite amlodipine recommencement.  He states he will buy new BP cuff to monitor at home. ?

## 2022-01-02 ENCOUNTER — Other Ambulatory Visit: Payer: Self-pay

## 2022-01-02 ENCOUNTER — Emergency Department
Admission: EM | Admit: 2022-01-02 | Discharge: 2022-01-02 | Disposition: A | Discharge status: Home / Self Care | Payer: Self-pay | Attending: Emergency Medicine | Admitting: Emergency Medicine

## 2022-01-02 ENCOUNTER — Emergency Department: Payer: Self-pay

## 2022-01-02 DIAGNOSIS — M791 Myalgia, unspecified site: Secondary | ICD-10-CM | POA: Insufficient documentation

## 2022-01-02 DIAGNOSIS — Z1152 Encounter for screening for COVID-19: Secondary | ICD-10-CM | POA: Insufficient documentation

## 2022-01-02 DIAGNOSIS — R6884 Jaw pain: Secondary | ICD-10-CM | POA: Insufficient documentation

## 2022-01-02 DIAGNOSIS — J441 Chronic obstructive pulmonary disease with (acute) exacerbation: Secondary | ICD-10-CM | POA: Insufficient documentation

## 2022-01-02 LAB — CBC
HCT: 54.3 % — ABNORMAL HIGH (ref 39.0–52.0)
Hemoglobin: 19 g/dL — ABNORMAL HIGH (ref 13.0–17.0)
MCH: 31.3 pg (ref 26.0–34.0)
MCHC: 35 g/dL (ref 30.0–36.0)
MCV: 89.5 fL (ref 80.0–100.0)
Platelets: 178 10*3/uL (ref 150–400)
RBC: 6.07 MIL/uL — ABNORMAL HIGH (ref 4.22–5.81)
RDW: 12.3 % (ref 11.5–15.5)
WBC: 6.5 10*3/uL (ref 4.0–10.5)
nRBC: 0 % (ref 0.0–0.2)

## 2022-01-02 LAB — BASIC METABOLIC PANEL
Anion gap: 7 (ref 5–15)
BUN: 6 mg/dL (ref 6–20)
CO2: 25 mmol/L (ref 22–32)
Calcium: 9.1 mg/dL (ref 8.9–10.3)
Chloride: 103 mmol/L (ref 98–111)
Creatinine, Ser: 0.91 mg/dL (ref 0.61–1.24)
GFR, Estimated: >60 mL/min (ref >60)
Glucose, Bld: 107 mg/dL — ABNORMAL HIGH (ref 70–99)
Potassium: 4.2 mmol/L (ref 3.5–5.1)
Sodium: 135 mmol/L (ref 135–145)

## 2022-01-02 LAB — RESP PANEL BY RT-PCR (FLU A&B, COVID) ARPGX2
Influenza A by PCR: NEGATIVE
Influenza B by PCR: NEGATIVE
SARS Coronavirus 2 by RT PCR: NEGATIVE

## 2022-01-02 LAB — TROPONIN I (HIGH SENSITIVITY): Troponin I (High Sensitivity): 5 ng/L (ref <18)

## 2022-01-02 MED ORDER — METHYLPREDNISOLONE SODIUM SUCC 125 MG IJ SOLR
125.0000 mg | Freq: Once | INTRAMUSCULAR | Status: DC
  Filled 2022-01-02: qty 2

## 2022-01-02 MED ORDER — IPRATROPIUM-ALBUTEROL 0.5-2.5 (3) MG/3ML IN SOLN
3.0000 mL | Freq: Once | RESPIRATORY_TRACT | Status: AC
  Administered 2022-01-02: 3 mL via RESPIRATORY_TRACT
  Filled 2022-01-02: qty 3

## 2022-01-02 MED ORDER — ALBUTEROL SULFATE HFA 108 (90 BASE) MCG/ACT IN AERS
2.0000 | INHALATION_SPRAY | Freq: Four times a day (QID) | RESPIRATORY_TRACT | 2 refills | Status: DC | PRN
  Filled 2022-01-02: qty 6.7, 25d supply, fill #0

## 2022-01-02 MED ORDER — DEXAMETHASONE SODIUM PHOSPHATE 10 MG/ML IJ SOLN
10.0000 mg | Freq: Once | INTRAMUSCULAR | Status: AC
  Administered 2022-01-02: 10 mg via INTRAMUSCULAR
  Filled 2022-01-02: qty 1

## 2022-01-02 NOTE — ED Triage Notes (Signed)
Pt to ED via POV from home. Pt reports body aches, CP, SOB jaw pain, cough and congestion since Saturday. Pt denies any sick contacts.

## 2022-01-02 NOTE — ED Provider Notes (Signed)
[facility] Provider Note    Event Date/Time   First MD Initiated Contact with Patient 01/02/22 [phone]     (approximate)   History   Cough, Nasal Congestion, Chills, and Chest Pain   HPI  [name] [name] is a 51 y.o. male presents to the emergency department for treatment and evaluation of body aches, chest pain with cough, and jaw pain "because I have coughed so much." No known fever.       Physical Exam   Triage Vital Signs: ED Triage Vitals  Enc Vitals Group     BP 01/02/22 0726 (!) 163/117     Pulse Rate 01/02/22 0726 95     Resp 01/02/22 0726 20     Temp 01/02/22 0726 97.7 F (36.5 C)     Temp Source 01/02/22 0726 Oral     SpO2 01/02/22 0726 98 %     Weight 01/02/22 0803 (!) 371 lb 0.6 oz (168.3 kg)     Height 01/02/22 0803 '6\' 2"'$  (1.88 m)     Head Circumference --      Peak Flow --      Pain Score 01/02/22 0723 7     Pain Loc --      Pain Edu? --      Excl. in [place]? --     Most recent vital signs: Vitals:   01/02/22 0726  BP: (!) 163/117  Pulse: 95  Resp: 20  Temp: 97.7 F (36.5 C)  SpO2: 98%     General: Awake, no distress.  CV:  Good peripheral perfusion.  Resp:  Normal effort. Diffuse wheezing and diminished breath sounds. Abd:  No distention.  Other:     ED Results / Procedures / Treatments   Labs (all labs ordered are listed, but only abnormal results are displayed) Labs Reviewed  BASIC METABOLIC PANEL - Abnormal; Notable for the following components:      Result Value   Glucose, Bld 107 (*)    All other components within normal limits  CBC - Abnormal; Notable for the following components:   RBC 6.07 (*)    Hemoglobin 19.0 (*)    HCT 54.3 (*)    All other components within normal limits  RESP PANEL BY RT-PCR (FLU A&B, COVID) ARPGX2  TROPONIN I (HIGH SENSITIVITY)  TROPONIN I (HIGH SENSITIVITY)     EKG  Sinus rhythm, rate of 92   RADIOLOGY  No acute cardiopulmonary abnormality.  X-ray viewed and  interpreted by me.  Radiology report consistent with the same.   PROCEDURES:  Critical Care performed: No  Procedures   MEDICATIONS ORDERED IN ED: Medications  ipratropium-albuterol (DUONEB) 0.5-2.5 (3) MG/3ML nebulizer solution 3 mL (3 mLs Nebulization Given 01/02/22 0818)  ipratropium-albuterol (DUONEB) 0.5-2.5 (3) MG/3ML nebulizer solution 3 mL (3 mLs Nebulization Given 01/02/22 0853)  dexamethasone (DECADRON) injection 10 mg (10 mg Intramuscular Given 01/02/22 0853)     IMPRESSION / MDM / ASSESSMENT AND PLAN / ED COURSE  I reviewed the triage vital signs and the nursing notes.                              Differential diagnosis includes, but is not limited to, COPD, bronchitis, pneumonia, COVID, influenza  Patient's presentation is most consistent with acute presentation with potential threat to life or bodily function.  51 year old male presenting to the emergency department for treatment and evaluation of cough, shortness of breath and  symptoms as described in the HPI.  Plan will be to get labs, EKG, chest x-ray, and viral panel.  Viral panel is negative.  Labs are overall reassuring including negative troponin.  Chest x-ray does not indicate acute concerns.  Patient received 2 DuoNebs while in the emergency department as well as an injection of Decadron.  Reevaluated by ED attending, Dr. Corky Downs, then discharged home.     FINAL CLINICAL IMPRESSION(S) / ED DIAGNOSES   Final diagnoses:  Chronic obstructive pulmonary disease with acute exacerbation ([place])     Rx / DC Orders   ED Discharge Orders          Ordered    albuterol (VENTOLIN HFA) 108 (90 Base) MCG/ACT inhaler  Every 6 hours PRN        01/02/22 0857             Note:  This document was prepared using Dragon voice recognition software and may include unintentional dictation errors.   Victorino Dike, FNP 01/02/22 1002    Lavonia Drafts, MD 01/02/22 1125    Lavonia Drafts, MD 01/02/22  1126

## 2022-01-05 ENCOUNTER — Other Ambulatory Visit: Payer: Self-pay

## 2022-07-05 ENCOUNTER — Telehealth: Payer: Self-pay | Admitting: Primary Care

## 2022-07-05 ENCOUNTER — Ambulatory Visit (INDEPENDENT_AMBULATORY_CARE_PROVIDER_SITE_OTHER): Payer: BLUE CROSS/BLUE SHIELD | Admitting: Primary Care

## 2022-07-05 ENCOUNTER — Encounter: Payer: Self-pay | Admitting: Primary Care

## 2022-07-05 ENCOUNTER — Telehealth: Payer: Self-pay

## 2022-07-05 VITALS — BP 146/94 | HR 84 | Temp 97.7°F | Ht 73.25 in | Wt 350.0 lb

## 2022-07-05 DIAGNOSIS — Z125 Encounter for screening for malignant neoplasm of prostate: Secondary | ICD-10-CM | POA: Diagnosis not present

## 2022-07-05 DIAGNOSIS — G8929 Other chronic pain: Secondary | ICD-10-CM

## 2022-07-05 DIAGNOSIS — F1011 Alcohol abuse, in remission: Secondary | ICD-10-CM

## 2022-07-05 DIAGNOSIS — F411 Generalized anxiety disorder: Secondary | ICD-10-CM

## 2022-07-05 DIAGNOSIS — J449 Chronic obstructive pulmonary disease, unspecified: Secondary | ICD-10-CM

## 2022-07-05 DIAGNOSIS — I10 Essential (primary) hypertension: Secondary | ICD-10-CM | POA: Diagnosis not present

## 2022-07-05 DIAGNOSIS — M25562 Pain in left knee: Secondary | ICD-10-CM

## 2022-07-05 DIAGNOSIS — F3341 Major depressive disorder, recurrent, in partial remission: Secondary | ICD-10-CM

## 2022-07-05 DIAGNOSIS — J309 Allergic rhinitis, unspecified: Secondary | ICD-10-CM

## 2022-07-05 DIAGNOSIS — M25561 Pain in right knee: Secondary | ICD-10-CM

## 2022-07-05 DIAGNOSIS — K219 Gastro-esophageal reflux disease without esophagitis: Secondary | ICD-10-CM

## 2022-07-05 MED ORDER — DICLOFENAC SODIUM 1 % EX GEL: 2.0000 g | Freq: Four times a day (QID) | CUTANEOUS | 0 refills | Status: DC

## 2022-07-05 MED ORDER — ACETAMINOPHEN 500 MG PO TABS: 1000.0000 mg | ORAL_TABLET | Freq: Three times a day (TID) | ORAL | 0 refills | Status: AC | PRN

## 2022-07-05 MED ORDER — AMLODIPINE BESYLATE 5 MG PO TABS: 5.0000 mg | ORAL_TABLET | Freq: Every day | ORAL | 0 refills | Status: DC

## 2022-07-05 MED ORDER — ALBUTEROL SULFATE HFA 108 (90 BASE) MCG/ACT IN AERS: 2.0000 | INHALATION_SPRAY | Freq: Four times a day (QID) | RESPIRATORY_TRACT | 0 refills | Status: DC | PRN

## 2022-07-05 MED ORDER — TRELEGY ELLIPTA 100-62.5-25 MCG/ACT IN AEPB: 1.0000 | INHALATION_SPRAY | Freq: Every day | RESPIRATORY_TRACT | 5 refills | Status: DC

## 2022-07-05 MED ORDER — FAMOTIDINE 20 MG PO TABS: 20.0000 mg | ORAL_TABLET | Freq: Every day | ORAL | 0 refills | Status: AC

## 2022-07-05 MED ORDER — CETIRIZINE HCL 10 MG PO TABS: 10.0000 mg | ORAL_TABLET | Freq: Every day | ORAL | 3 refills | Status: AC

## 2022-07-05 NOTE — Assessment & Plan Note (Signed)
Commended him on his 2 months of sobriety, encouraged to continue.  Labs pending today including CMP, CBC.

## 2022-07-05 NOTE — Assessment & Plan Note (Signed)
Overall appears stable.  Remain off of Lexapro as this caused suicidal thoughts. Recommended therapy for which he kindly declines. Continue to monitor.

## 2022-07-05 NOTE — Telephone Encounter (Signed)
When checking out after most recent appt, patient requested a clinical team member give him a call with his lab results from 07/05/22. States he has Clinical cytogeneticist but does not have computer access. Please advise, thank you.

## 2022-07-05 NOTE — Assessment & Plan Note (Signed)
Uncontrolled.  Commended him on smoking and alcohol cessation. We also discussed triggers for esophageal reflux, handout provided today.  Encouraged healthier diet and continued weight loss efforts.  Start famotidine 20 mg daily. Continue to monitor.

## 2022-07-05 NOTE — Assessment & Plan Note (Signed)
Reviewed x-rays from 2020.  Discussed that he can increase Tylenol up to 3000 mg in 24 hours. Prescription for Voltaren gel provided to use 4 times daily as needed.  Encourage continued weight loss. Recommended he follow-up with sports medicine.

## 2022-07-05 NOTE — Telephone Encounter (Signed)
Please attempt PA for Fluticasone-Umeclidin-Vilant (TRELEGY ELLIPTA) 100-62.5-25 MCG/ACT AEPB . Thanks

## 2022-07-05 NOTE — Progress Notes (Signed)
Subjective:    Patient ID: [name] [name], male    DOB: Aug 04, 1970, 52 y.o.   MRN: [id number]  HPI  [name] [name] is a very pleasant 52 y.o. male with a history of COPD, GERD, GAD, ADHD, MDD, alcohol use disorder who presents today for follow up.   His health advisor joins [place] today.   He has not been seen by me since 2022. He has not taken many of his medications in >1 year.   1) GAD/MDD: Chronic history that was exacerbated by alcohol abuse.  Previously managed on Lexapro 10 mg daily. Today he mentions that Lexapro caused him to experience SI/HI thoughts.   Overall he is doing better.  He's currently enrolled in a faith-based mens rehab program locally which has helped some of his prior symptoms.  He does not meet with therapy.  2) COPD: Previously managed on Trelegy Ellipta 100-62.5-25 mcg inhaler and albuterol inhaler PRN. He has not had a cigarette in 2 months since residing at [facility] rehab facility. He is needing refills of both inhalers.  He did very well on Trelegy Ellipta, rarely had to use his albuterol inhaler.  He did visit the ED in October 2023 due to a COPD exacerbation.  He is more active outdoors and has noticed shortness of breath and allergy symptoms.  He is also needing a refill of his Zyrtec.  3) Essential Hypertension: Previously managed on amlodipine 10 mg daily. His last dose of amlodipine was about 2 months ago. He has checked his BP at home a few times which runs 160's/100's.   He has been more actively recently. He denies headaches, chest pain.  Currently enrolled in rehab program which encourages patients to make healthier choices.  He is working to improve his diet but does struggle.  BP Readings from Last 3 Encounters:  07/05/22 (!) 146/94  01/02/22 (!) 163/117  06/16/21 (!) 160/118   Wt Readings from Last 3 Encounters:  07/05/22 (!) 350 lb (158.8 kg)  01/02/22 (!) 371 lb 0.6 oz (168.3 kg)  06/16/21 (!) 371 lb 2 oz (168.3 kg)   4) Alcohol  Use Disorder: Chronic for years. He is currently sober as of 2 months. He is residing in a faith-based rehab program locally ([facility]) and is feeling better overall.  He plans to remain in this program for the rest of this year.  5) GERD: Chronic. Symptoms include chest tightness, belching, esophageal burning which occur daily to every other day. He has quit smoking and drinking alcohol as of 2 months ago.  This has helped somewhat with symptoms.  He is requesting some form of treatment today.  6) Chronic Pain: Chronic to the neck, bilateral hands, bilateral knees, bilateral lower extremities, and bilateral feet. Also with numbness to the bilateral hands at 4th and 5th digits.   He is currently managed on Tylenol 500 mg BID which does not help much with pain.  He recently began using Voltaren gel which has helped somewhat.  He is not allowed many options for pain treatment while living in the rehab facility.  Review of Systems  Respiratory:  Positive for shortness of breath.   Gastrointestinal:  Negative for constipation and diarrhea.  Endocrine: Positive for polyuria.  Musculoskeletal:  Positive for arthralgias. Negative for back pain.  Allergic/Immunologic: Positive for environmental allergies.  Neurological:  Negative for headaches.  Psychiatric/Behavioral:  The patient is not nervous/anxious.          Past Medical History:  Diagnosis Date  Abdominal pain 07/31/2014   Abnormal CT scan, small bowel 12/28/2015   Acute bronchitis 04/09/2013   Acute conjunctivitis of both eyes 09/04/2017   Acute pharyngitis 04/13/2013   ADHD (attention deficit hyperactivity disorder) 10/21/2006   Qualifier: Diagnosis of  By: Jillyn Hidden FNP, Mcarthur Rossetti    AKI (acute kidney injury) (HCC) 06/26/2017   Alternating constipation and diarrhea 10/12/2015   ANGER 10/31/2006   Qualifier: Diagnosis of  By: Jillyn Hidden FNP, Mcarthur Rossetti    ANXIETY 11/14/2009   Qualifier: Diagnosis of  By: Dayton Martes MD, Talia      Asthma    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   Qualifier: Diagnosis of  By: Jillyn Hidden FNP, Billie-Lynn Daniels    Bronchitis    Calculus of gallbladder with acute cholecystitis without obstruction 02/15/2015   Chest pain 03/17/2018   Chest wall pain 02/10/2015   Cholelithiasis 02/13/2015   Chronic bronchitis (HCC)    Complication of anesthesia    usually wake up in the middle of procedure or surgery   Conjunctivitis 10/18/2010   COPD exacerbation (HCC) 10/13/2019   COPD with acute exacerbation (HCC) 06/26/2017   COVID-19 virus infection 03/27/2019   Dehydration 06/26/2017   DEPENDENT EDEMA 11/09/2008   Qualifier: Diagnosis of  By: Jillyn Hidden FNP, Mcarthur Rossetti    Diarrhea 11/06/2016   Enteritis 12/21/2015   Erectile dysfunction 04/11/2017   Gallstone 02/10/2015   Gallstones    GASTROENTERITIS WITHOUT DEHYDRATION 05/15/2010   Qualifier: Diagnosis of  By: Dayton Martes MD, Talia     GERD 10/21/2006   Qualifier: Diagnosis of  By: Jillyn Hidden FNP, Mcarthur Rossetti    GERD (gastroesophageal reflux disease)    Hepatic steatosis 05/31/2015   History of colonic polyps 12/28/2015   Hypertension    Left knee injury, initial encounter 06/17/2021   Lumbar back sprain 01/28/2017   Mole of skin 05/28/2012   Morbid obesity (HCC) 07/30/2006   Qualifier: Diagnosis of  By: Jillyn Hidden FNP, Billie-Lynn Daniels    Nausea and vomiting 07/03/2019   Nausea vomiting and diarrhea 07/31/2014   Obesity    OSA (obstructive sleep apnea) 07/30/2006   Qualifier: Diagnosis of  By: Jillyn Hidden FNP, Billie-Lynn Daniels    Partial small bowel obstruction (HCC) 12/14/2015   Right knee pain 12/01/2012   SBO (small bowel obstruction) (HCC) 06/26/2017   Skin lesion 11/07/2014   Skin tag 05/28/2012   Sleep apnea    "suppose to wear mask but I don't" (05/31/2015)   Small bowel obstruction (HCC) 05/31/2015   Small bowel obstruction, partial (HCC) 12/13/2015   Smoker 09/04/2017   Viral gastroenteritis 03/23/2016     Social History   Socioeconomic History   Marital status: Legally Separated    Spouse name: Not on file   Number of children: 1   Years of education: Not on file   Highest education level: Not on file  Occupational History   Occupation: Corporate treasurer    Employer: [employer]  Tobacco Use   Smoking status: Former    Packs/day: 1.00    Years: 20.00    Additional pack years: 0.00    Total pack years: 20.00    Types: Cigarettes    Start date: 10/03/2016    Quit date: 10/03/2017    Years since quitting: 4.7   Smokeless tobacco: Current    Types: Snuff  Vaping Use   Vaping Use: Never used  Substance and Sexual Activity   Alcohol use: Yes    Alcohol/week: 13.0 standard drinks of alcohol  Types: 13 Shots of liquor per week    Comment: "a couple of shots a day"   Drug use: No   Sexual activity: Yes  Other Topics Concern   Not on file  Social History Narrative   Not on file   Social Determinants of Health   Financial Resource Strain: Not on file  Food Insecurity: Not on file  Transportation Needs: Not on file  Physical Activity: Not on file  Stress: Not on file  Social Connections: Not on file  Intimate Partner Violence: Not on file    Past Surgical History:  Procedure Laterality Date   CHOLECYSTECTOMY N/A 02/14/2015   Procedure: LAPAROSCOPIC CHOLECYSTECTOMY WITH INTRAOPERATIVE CHOLANGIOGRAM;  Surgeon: Abigail Miyamoto, MD;  Location: [facility] OR;  Service: General;  Laterality: N/A;   COLONOSCOPY  10/16/2005   Polyps (tubular adenoma 2007), external hemorrhoids   COLONOSCOPY WITH PROPOFOL N/A 01/05/2016   Procedure: COLONOSCOPY WITH PROPOFOL;  Surgeon: Rachael Fee, MD;  Location: [facility] ENDOSCOPY;  Service: Endoscopy;  Laterality: N/A;   FINGER SURGERY Left    reconstructive - 2 surgeries on hand   LAPAROSCOPIC GASTRIC SLEEVE RESECTION  10/2010    at [facility] History  Problem Relation Age of Onset   Diabetes Mellitus II Mother    Breast cancer Mother     Ovarian cancer Mother    Diabetes Mellitus II Father    CAD Father    Stroke Father    Cancer Father        ?   Clotting disorder Father        blood clots in brain with surgery   Cirrhosis Maternal Grandmother    Breast cancer Maternal Grandmother     Allergies  Allergen Reactions   Bee Venom Anaphylaxis   Codeine Anaphylaxis    Tolerates dilaudid   Hornet Venom Anaphylaxis   Penicillins Hives and Other (See Comments)    Has patient had a PCN reaction causing immediate rash, facial/tongue/throat swelling, SOB or lightheadedness with hypotensionYES Has patient had a PCN reaction causing severe rash involving mucus membranes or skin necrosis: NO Has patient had a PCN reaction that required hospitalization NO Has patient had a PCN reaction occurring within the last 10 years: NO If all of the above answers are "NO", then may proceed with Cephalosporin use.    Current Outpatient Medications on File Prior to Visit  Medication Sig Dispense Refill   fluticasone (FLONASE) 50 MCG/ACT nasal spray Place 2 sprays into both nostrils daily. (Patient not taking: Reported on 06/16/2021) 16 g 6   Current Facility-Administered Medications on File Prior to Visit  Medication Dose Route Frequency Provider Last Rate Last Admin   technetium tetrofosmin (TC-MYOVIEW) injection 32.6 millicurie  32.6 millicurie Intravenous Once PRN Nahser, Deloris Ping, MD        BP (!) 146/94   Pulse 84   Temp 97.7 F (36.5 C) (Temporal)   Ht 6' 1.25" (1.861 m)   Wt (!) 350 lb (158.8 kg)   SpO2 98%   BMI 45.86 kg/m  Objective:   Physical Exam Cardiovascular:     Rate and Rhythm: Normal rate and regular rhythm.  Pulmonary:     Effort: Pulmonary effort is normal.     Breath sounds: Normal breath sounds. No wheezing or rales.  Abdominal:     General: Bowel sounds are normal.     Palpations: Abdomen is soft.  Musculoskeletal:     Cervical back: Neck supple.  Skin:  General: Skin is warm and dry.   Neurological:     Mental Status: He is alert and oriented to person, place, and time.  Psychiatric:        Mood and Affect: Mood normal.           Assessment & Plan:  Essential hypertension Assessment & Plan: Above goal today.  Resume amlodipine at 5 mg daily. We will plan to see him back in the office in 2 to 3 weeks for blood pressure check. He will monitor his blood pressure at home in the interim.  Orders: -     Hemoglobin A1c -     Comprehensive metabolic panel -     CBC -     amLODIPine Besylate; Take 1 tablet (5 mg total) by mouth daily. for blood pressure.  Dispense: 90 tablet; Refill: 0  Screening for prostate cancer -     PSA  Chronic obstructive pulmonary disease, unspecified COPD type (HCC) Assessment & Plan: Uncontrolled.  Resume Trelegy Ellipta 100-62.5-25 micrograms inhaler, 1 puff daily. Refill provided for albuterol inhaler to use as needed.  Orders: -     Albuterol Sulfate HFA; Inhale 2 puffs into the lungs every 6 (six) hours as needed for wheezing or shortness of breath.  Dispense: 6.7 g; Refill: 0 -     Trelegy Ellipta; Inhale 1 puff into the lungs daily.  Dispense: 60 each; Refill: 5  Chronic pain of both knees Assessment & Plan: Reviewed x-rays from 2020.  Discussed that he can increase Tylenol up to 3000 mg in 24 hours. Prescription for Voltaren gel provided to use 4 times daily as needed.  Encourage continued weight loss. Recommended he follow-up with sports medicine.  Orders: -     Acetaminophen; Take 2 tablets (1,000 mg total) by mouth every 8 (eight) hours as needed for moderate pain.  Dispense: 180 tablet; Refill: 0 -     Diclofenac Sodium; Apply 2 g topically 4 (four) times daily.  Dispense: 200 g; Refill: 0  Gastroesophageal reflux disease, unspecified whether esophagitis present Assessment & Plan: Uncontrolled.  Commended him on smoking and alcohol cessation. We also discussed triggers for esophageal reflux, handout provided  today.  Encouraged healthier diet and continued weight loss efforts.  Start famotidine 20 mg daily. Continue to monitor.  Orders: -     Famotidine; Take 1 tablet (20 mg total) by mouth daily. for heartburn.  Dispense: 90 tablet; Refill: 0  Allergic rhinitis, unspecified seasonality, unspecified trigger -     Cetirizine HCl; Take 1 tablet (10 mg total) by mouth daily. For allergies  Dispense: 90 tablet; Refill: 3  Alcohol abuse, in remission Assessment & Plan: Commended him on his 2 months of sobriety, encouraged to continue.  Labs pending today including CMP, CBC.   GAD (generalized anxiety disorder) Assessment & Plan: Overall appears stable.  Remain off of Lexapro as this caused suicidal thoughts. Recommended therapy for which he kindly declines. Continue to monitor.   Recurrent major depressive disorder, in partial remission (HCC) Assessment & Plan: Overall appears stable.  Remain off of Lexapro as this caused suicidal thoughts. Recommended therapy for which he kindly declines. Continue to monitor.         Doreene Nest, NP

## 2022-07-05 NOTE — Patient Instructions (Addendum)
You may take up to 3000 mg of Tylenol in 24 hours.   Use the Voltaren Gel up to four times daily as needed for pain.  Start famotidine 20 mg daily for heartburn. Try to reduce trigger foods, take a look at the information below.  Resume your Trelegy Ellipta inhaler everyday for breathing. Inhale 1 puff into the lungs once daily.   Use the albuterol rescue inhaler only if needed.   Start amlodipine 5 mg once daily for blood pressure.  Stop by the lab prior to leaving today. I will notify you of your results once received.   Start monitoring your blood pressure daily, around the same time of day, for the next 2-3 weeks.  Ensure that you have rested for 30 minutes prior to checking your blood pressure.   Record your readings and bring them to your next visit.  Please schedule a follow up visit to meet back with me in 2-3 weeks for blood pressure check.   It was a pleasure to see you today!

## 2022-07-05 NOTE — Assessment & Plan Note (Signed)
Overall appears stable.  Remain off of Lexapro as this caused suicidal thoughts. Recommended therapy for which he kindly declines. Continue to monitor. 

## 2022-07-05 NOTE — Telephone Encounter (Signed)
Noted and will do

## 2022-07-05 NOTE — Telephone Encounter (Signed)
Will keep in the "[place] pool" so we are aware but also sent to PCP so she is aware also

## 2022-07-05 NOTE — Assessment & Plan Note (Signed)
Uncontrolled.  Resume Trelegy Ellipta 100-62.5-25 micrograms inhaler, 1 puff daily. Refill provided for albuterol inhaler to use as needed.

## 2022-07-05 NOTE — Assessment & Plan Note (Signed)
Above goal today.  Resume amlodipine at 5 mg daily. We will plan to see him back in the office in 2 to 3 weeks for blood pressure check. He will monitor his blood pressure at home in the interim.

## 2022-07-06 ENCOUNTER — Other Ambulatory Visit (HOSPITAL_COMMUNITY): Payer: Self-pay

## 2022-07-06 LAB — CBC
HCT: 43.9 % (ref 39.0–52.0)
Hemoglobin: 15.3 g/dL (ref 13.0–17.0)
MCHC: 34.8 g/dL (ref 30.0–36.0)
MCV: 89.8 fl (ref 78.0–100.0)
Platelets: 195 10*3/uL (ref 150.0–400.0)
RBC: 4.89 Mil/uL (ref 4.22–5.81)
RDW: 12.1 % (ref 11.5–15.5)
WBC: 6.2 10*3/uL (ref 4.0–10.5)

## 2022-07-06 LAB — COMPREHENSIVE METABOLIC PANEL
ALT: 19 U/L (ref 0–53)
AST: 19 U/L (ref 0–37)
Albumin: 4.3 g/dL (ref 3.5–5.2)
Alkaline Phosphatase: 54 U/L (ref 39–117)
BUN: 16 mg/dL (ref 6–23)
CO2: 28 mEq/L (ref 19–32)
Calcium: 9.1 mg/dL (ref 8.4–10.5)
Chloride: 101 mEq/L (ref 96–112)
Creatinine, Ser: 0.95 mg/dL (ref 0.40–1.50)
GFR: 93.31 mL/min (ref >60.00)
Glucose, Bld: 93 mg/dL (ref 70–99)
Potassium: 4.2 mEq/L (ref 3.5–5.1)
Sodium: 138 mEq/L (ref 135–145)
Total Bilirubin: 0.6 mg/dL (ref 0.2–1.2)
Total Protein: 6.9 g/dL (ref 6.0–8.3)

## 2022-07-06 LAB — HEMOGLOBIN A1C: Hgb A1c MFr Bld: 5.3 % (ref 4.6–6.5)

## 2022-07-06 LAB — PSA: PSA: 0.12 ng/mL (ref 0.10–4.00)

## 2022-07-06 MED ORDER — FLUTICASONE-SALMETEROL 250-50 MCG/ACT IN AEPB: 1.0000 | INHALATION_SPRAY | Freq: Two times a day (BID) | RESPIRATORY_TRACT | 5 refills | Status: DC

## 2022-07-06 NOTE — Telephone Encounter (Addendum)
Please call patient:  Notify him that the Trelegy inhaler will cost him $485 with insurance.  We can try different inhaler for his COPD such as Advair.  I do not see where he has ever tried this before so I will send it to his pharmacy.  He will inhale 1 puff twice a day, rinse mouth after each use.

## 2022-07-06 NOTE — Addendum Note (Signed)
Addended by: Doreene Nest on: 07/06/2022 04:24 PM   Modules accepted: Orders

## 2022-07-06 NOTE — Telephone Encounter (Signed)
Pharmacy Patient Advocate Encounter   Received notification from [facility]IN[facility] that prior authorization for TRELEGY is needed.    Per test claim no PA is required, however patient does have a high cost due to deductible not yet being met.      Haze Rushing, CPhT Pharmacy Patient Advocate Specialist Direct Number: [phone] Fax: [phone]

## 2022-07-09 NOTE — Telephone Encounter (Signed)
Unable to reach patient, number listed belonged to a business.      

## 2022-07-10 NOTE — Telephone Encounter (Signed)
Unable to reach patient, number listed belonged to a business.

## 2022-07-11 ENCOUNTER — Encounter: Payer: Self-pay | Admitting: Primary Care

## 2022-07-11 NOTE — Telephone Encounter (Signed)
Noted. Patient scheduled for 07/26/22, will discuss then.

## 2022-07-11 NOTE — Telephone Encounter (Signed)
Unable to reach patient. Number listed in patients chart is to a business.  MyChart message sent.  Mailing letter for patient to return call to office.

## 2022-07-17 NOTE — Telephone Encounter (Signed)
Patient called in returning a call he received. Relayed message regarding inhaler and labs. Patient stated that he may need a new prescription as he wasn't aware that the inhaler had already been sent in on May 3rd. Thank you!

## 2022-07-17 NOTE — Telephone Encounter (Signed)
Attempted to reach patient at number listed in chart, unsuccessful. Rx should still be at the pharmacy, have him contact pharmacy if he's not able to pick up then let us know.

## 2022-07-26 ENCOUNTER — Ambulatory Visit: Payer: Medicaid Other | Admitting: Primary Care

## 2022-07-31 ENCOUNTER — Telehealth: Payer: Self-pay | Admitting: Primary Care

## 2022-07-31 ENCOUNTER — Ambulatory Visit: Payer: BLUE CROSS/BLUE SHIELD | Admitting: Primary Care

## 2022-07-31 ENCOUNTER — Encounter: Payer: Self-pay | Admitting: Primary Care

## 2022-07-31 VITALS — BP 138/88 | HR 75 | Temp 97.3°F | Ht 73.25 in | Wt 354.0 lb

## 2022-07-31 DIAGNOSIS — J449 Chronic obstructive pulmonary disease, unspecified: Secondary | ICD-10-CM

## 2022-07-31 DIAGNOSIS — F17200 Nicotine dependence, unspecified, uncomplicated: Secondary | ICD-10-CM

## 2022-07-31 DIAGNOSIS — I10 Essential (primary) hypertension: Secondary | ICD-10-CM

## 2022-07-31 DIAGNOSIS — M25561 Pain in right knee: Secondary | ICD-10-CM

## 2022-07-31 DIAGNOSIS — G8929 Other chronic pain: Secondary | ICD-10-CM

## 2022-07-31 DIAGNOSIS — K219 Gastro-esophageal reflux disease without esophagitis: Secondary | ICD-10-CM | POA: Diagnosis not present

## 2022-07-31 DIAGNOSIS — M25562 Pain in left knee: Secondary | ICD-10-CM

## 2022-07-31 MED ORDER — FLUTICASONE-SALMETEROL 250-50 MCG/ACT IN AEPB: 1.0000 | INHALATION_SPRAY | Freq: Two times a day (BID) | RESPIRATORY_TRACT | 5 refills | Status: DC

## 2022-07-31 MED ORDER — AMLODIPINE BESYLATE 10 MG PO TABS: 10.0000 mg | ORAL_TABLET | Freq: Every day | ORAL | 0 refills | Status: DC

## 2022-07-31 NOTE — Telephone Encounter (Signed)
Noted, Rx for Advair sent to [facility] pharmacy

## 2022-07-31 NOTE — Assessment & Plan Note (Signed)
Improved.  Continue Tylenol 1000 mg BID. Recommended to schedule a visit with Dr. Patsy Lager.

## 2022-07-31 NOTE — Telephone Encounter (Signed)
Called and spoke to patient, he states they need the Advair inhaler sent to patients new pharmacy, [facility] [address].

## 2022-07-31 NOTE — Assessment & Plan Note (Signed)
Improved but still above goal, same for home readings.  Increase amlodipine to 10 mg daily. He will continue to monitor BP at home and call us with readings in 2 weeks.  Continue to work on physical activity, weight loss.

## 2022-07-31 NOTE — Patient Instructions (Signed)
We increased your dose of amlodipine to 10 mg daily.  This is for blood pressure.  I sent a new prescription to your pharmacy.  Continue to monitor your blood pressure as discussed, call me in 2 weeks with readings.  Pick up the inhaler from the pharmacy, notify me if it is cost prohibitive.  You are due for your colonoscopy, notify me when you are ready to have this done.  Schedule a visit with Dr. Patsy Lager, our sports medicine doctor, for your knees.  It was a pleasure to see you today!

## 2022-07-31 NOTE — Assessment & Plan Note (Signed)
He will update if Advair is cost prohibitive. If so, then we will look into patient assistance for Trelegy.

## 2022-07-31 NOTE — Progress Notes (Signed)
Subjective:    Patient ID: [name] [name], male    DOB: Jan 17, 1971, 52 y.o.   MRN: [id number]  HPI  [name] [name] is a very pleasant 52 y.o. male with a history of hypertension, OSA, COPD, osteoarthritis, GAD, alcohol abuse in remission, MDD who presents today for follow-up.  His friend joins him today.  1) Hypertension: Currently managed on amlodipine 5 mg daily which was re-initiated a few weeks ago.  He had been off all medications for greater than 1 year.  Since his last visit he is compliant to his amlodipine 5 mg daily. He is checking his BP at home and is getting readings of 150's-160's/100's. He denies dizziness. He has noticed mild swelling to his ankles.   BP Readings from Last 3 Encounters:  07/31/22 138/88  07/05/22 (!) 146/94  01/02/22 (!) 163/117     2) COPD: Evaluated a few weeks ago, had not been seen in several years.  During this visit his symptoms were uncontrolled so we refilled his Trelegy Ellipta.  Soon thereafter his pharmacy notified us that his Trelegy inhaler was not covered by the insurance so we sent Advair to his pharmacy.  He's smoked since the age of 16-17 until 2009, then resumed smoking at 2018, was smoking 2-3 packs per day until 2024.    He has yet to pick up his Advair inhaler but plans to do this today. He has used his albuterol inhaler a few times with improvement.   3) GERD: Currently managed on famotidine 20 mg which was initiated last visit due to uncontrolled symptoms. Since his last visit he's feel better. His GERD symptoms have resolved.   Wt Readings from Last 3 Encounters:  07/31/22 (!) 354 lb (160.6 kg)  07/05/22 (!) 350 lb (158.8 kg)  01/02/22 (!) 371 lb 0.6 oz (168.3 kg)      Review of Systems  Eyes:  Negative for visual disturbance.  Respiratory:  Positive for shortness of breath. Negative for cough.   Cardiovascular:  Negative for chest pain.  Musculoskeletal:  Positive for arthralgias.  Neurological:  Negative for  headaches.  Psychiatric/Behavioral:  The patient is nervous/anxious.          Past Medical History:  Diagnosis Date   Abdominal pain 07/31/2014   Abnormal CT scan, small bowel 12/28/2015   Acute bronchitis 04/09/2013   Acute conjunctivitis of both eyes 09/04/2017   Acute pharyngitis 04/13/2013   ADHD (attention deficit hyperactivity disorder) 10/21/2006   Qualifier: Diagnosis of  By: Jillyn Hidden FNP, Mcarthur Rossetti    AKI (acute kidney injury) (HCC) 06/26/2017   Alternating constipation and diarrhea 10/12/2015   ANGER 10/31/2006   Qualifier: Diagnosis of  By: Jillyn Hidden FNP, Mcarthur Rossetti    ANXIETY 11/14/2009   Qualifier: Diagnosis of  By: Dayton Martes MD, Talia     Asthma    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   Qualifier: Diagnosis of  By: Jillyn Hidden FNP, Billie-Lynn Daniels    Bronchitis    Calculus of gallbladder with acute cholecystitis without obstruction 02/15/2015   Chest pain 03/17/2018   Chest wall pain 02/10/2015   Cholelithiasis 02/13/2015   Chronic bronchitis (HCC)    Complication of anesthesia    usually wake up in the middle of procedure or surgery   Conjunctivitis 10/18/2010   COPD exacerbation (HCC) 10/13/2019   COPD with acute exacerbation (HCC) 06/26/2017   COVID-19 virus infection 03/27/2019   Dehydration 06/26/2017   DEPENDENT EDEMA 11/09/2008   Qualifier: Diagnosis of  By: Jillyn Hidden  FNP, Mcarthur Rossetti    Diarrhea 11/06/2016   Enteritis 12/21/2015   Erectile dysfunction 04/11/2017   Gallstone 02/10/2015   Gallstones    GASTROENTERITIS WITHOUT DEHYDRATION 05/15/2010   Qualifier: Diagnosis of  By: Dayton Martes MD, Talia     GERD 10/21/2006   Qualifier: Diagnosis of  By: Jillyn Hidden FNP, Mcarthur Rossetti    GERD (gastroesophageal reflux disease)    Hepatic steatosis 05/31/2015   History of colonic polyps 12/28/2015   Hypertension    Left knee injury, initial encounter 06/17/2021   Lumbar back sprain 01/28/2017   Mole of skin 05/28/2012   Morbid obesity (HCC)  07/30/2006   Qualifier: Diagnosis of  By: Jillyn Hidden FNP, Billie-Lynn Daniels    Nausea and vomiting 07/03/2019   Nausea vomiting and diarrhea 07/31/2014   Obesity    OSA (obstructive sleep apnea) 07/30/2006   Qualifier: Diagnosis of  By: Jillyn Hidden FNP, Mcarthur Rossetti    Partial small bowel obstruction (HCC) 12/14/2015   Right knee pain 12/01/2012   SBO (small bowel obstruction) (HCC) 06/26/2017   Skin lesion 11/07/2014   Skin tag 05/28/2012   Sleep apnea    "suppose to wear mask but I don't" (05/31/2015)   Small bowel obstruction (HCC) 05/31/2015   Small bowel obstruction, partial (HCC) 12/13/2015   Smoker 09/04/2017   Viral gastroenteritis 03/23/2016    Social History   Socioeconomic History   Marital status: Legally Separated    Spouse name: Not on file   Number of children: 1   Years of education: Not on file   Highest education level: Not on file  Occupational History   Occupation: Corporate treasurer    Employer: [employer]  Tobacco Use   Smoking status: Former    Packs/day: 1.00    Years: 20.00    Additional pack years: 0.00    Total pack years: 20.00    Types: Cigarettes    Start date: 10/03/2016    Quit date: 10/03/2017    Years since quitting: 4.8   Smokeless tobacco: Current    Types: Snuff  Vaping Use   Vaping Use: Never used  Substance and Sexual Activity   Alcohol use: Yes    Alcohol/week: 13.0 standard drinks of alcohol    Types: 13 Shots of liquor per week    Comment: "a couple of shots a day"   Drug use: No   Sexual activity: Yes  Other Topics Concern   Not on file  Social History Narrative   Not on file   Social Determinants of Health   Financial Resource Strain: Not on file  Food Insecurity: Not on file  Transportation Needs: Not on file  Physical Activity: Not on file  Stress: Not on file  Social Connections: Not on file  Intimate Partner Violence: Not on file    Past Surgical History:  Procedure Laterality Date   CHOLECYSTECTOMY N/A  02/14/2015   Procedure: LAPAROSCOPIC CHOLECYSTECTOMY WITH INTRAOPERATIVE CHOLANGIOGRAM;  Surgeon: Abigail Miyamoto, MD;  Location: [facility] OR;  Service: General;  Laterality: N/A;   COLONOSCOPY  10/16/2005   Polyps (tubular adenoma 2007), external hemorrhoids   COLONOSCOPY WITH PROPOFOL N/A 01/05/2016   Procedure: COLONOSCOPY WITH PROPOFOL;  Surgeon: Rachael Fee, MD;  Location: [facility] ENDOSCOPY;  Service: Endoscopy;  Laterality: N/A;   FINGER SURGERY Left    reconstructive - 2 surgeries on hand   LAPAROSCOPIC GASTRIC SLEEVE RESECTION  10/2010    at [facility] History  Problem Relation Age of Onset  Diabetes Mellitus II Mother    Breast cancer Mother    Ovarian cancer Mother    Diabetes Mellitus II Father    CAD Father    Stroke Father    Cancer Father        ?   Clotting disorder Father        blood clots in brain with surgery   Cirrhosis Maternal Grandmother    Breast cancer Maternal Grandmother     Allergies  Allergen Reactions   Bee Venom Anaphylaxis   Codeine Anaphylaxis    Tolerates dilaudid   Hornet Venom Anaphylaxis   Penicillins Hives and Other (See Comments)    Has patient had a PCN reaction causing immediate rash, facial/tongue/throat swelling, SOB or lightheadedness with hypotensionYES Has patient had a PCN reaction causing severe rash involving mucus membranes or skin necrosis: NO Has patient had a PCN reaction that required hospitalization NO Has patient had a PCN reaction occurring within the last 10 years: NO If all of the above answers are "NO", then may proceed with Cephalosporin use.    Current Outpatient Medications on File Prior to Visit  Medication Sig Dispense Refill   acetaminophen (TYLENOL) 500 MG tablet Take 2 tablets (1,000 mg total) by mouth every 8 (eight) hours as needed for moderate pain. 180 tablet 0   albuterol (VENTOLIN HFA) 108 (90 Base) MCG/ACT inhaler Inhale 2 puffs into the lungs every 6 (six) hours as needed for wheezing or shortness of  breath. 6.7 g 0   cetirizine (ZYRTEC) 10 MG tablet Take 1 tablet (10 mg total) by mouth daily. For allergies 90 tablet 3   diclofenac Sodium (VOLTAREN) 1 % GEL Apply 2 g topically 4 (four) times daily. 200 g 0   famotidine (PEPCID) 20 MG tablet Take 1 tablet (20 mg total) by mouth daily. for heartburn. 90 tablet 0   fluticasone-salmeterol (ADVAIR) 250-50 MCG/ACT AEPB Inhale 1 puff into the lungs in the morning and at bedtime. 60 each 5   fluticasone (FLONASE) 50 MCG/ACT nasal spray Place 2 sprays into both nostrils daily. (Patient not taking: Reported on 06/16/2021) 16 g 6   Current Facility-Administered Medications on File Prior to Visit  Medication Dose Route Frequency Provider Last Rate Last Admin   technetium tetrofosmin (TC-MYOVIEW) injection 32.6 millicurie  32.6 millicurie Intravenous Once PRN Nahser, Deloris Ping, MD        BP 138/88   Pulse 75   Temp (!) 97.3 F (36.3 C) (Temporal)   Ht 6' 1.25" (1.861 m)   Wt (!) 354 lb (160.6 kg)   SpO2 98%   BMI 46.39 kg/m  Objective:   Physical Exam Cardiovascular:     Rate and Rhythm: Normal rate and regular rhythm.  Pulmonary:     Effort: Pulmonary effort is normal.     Breath sounds: Normal breath sounds. No wheezing or rales.  Musculoskeletal:     Cervical back: Neck supple.  Skin:    General: Skin is warm and dry.  Neurological:     Mental Status: He is alert and oriented to person, place, and time.  Psychiatric:        Mood and Affect: Mood normal.           Assessment & Plan:  Essential hypertension Assessment & Plan: Improved but still above goal, same for home readings.  Increase amlodipine to 10 mg daily. He will continue to monitor BP at home and call us with readings in 2 weeks.  Continue to work  on physical activity, weight loss.  Orders: -     amLODIPine Besylate; Take 1 tablet (10 mg total) by mouth daily. for blood pressure.  Dispense: 90 tablet; Refill: 0  Chronic obstructive pulmonary disease,  unspecified COPD type (HCC) Assessment & Plan: He will update if Advair is cost prohibitive. If so, then we will look into patient assistance for Trelegy.     Gastroesophageal reflux disease, unspecified whether esophagitis present Assessment & Plan: Controlled.  Continue famotidine 20 mg daily.   Smoker Assessment & Plan: Commended him on cessation.  He declines lung cancer screening today.   Chronic pain of both knees Assessment & Plan: Improved.  Continue Tylenol 1000 mg BID. Recommended to schedule a visit with Dr. Patsy Lager.         Doreene Nest, NP

## 2022-07-31 NOTE — Assessment & Plan Note (Signed)
Commended him on cessation.  He declines lung cancer screening today.

## 2022-07-31 NOTE — Telephone Encounter (Signed)
Patient contacted the office regarding medication listed below, states they had discussed reordering this at appointment on 5/28. Went to pick medication up and was told by pharmacy that this medication was not sent in yet. Patient is asking for this medication to be sent to [facility] on [address]. Please advise, thank you.

## 2022-07-31 NOTE — Assessment & Plan Note (Signed)
Controlled. ? ?Continue famotidine 20 mg daily.  ?

## 2022-10-26 ENCOUNTER — Other Ambulatory Visit: Payer: Self-pay | Admitting: Primary Care

## 2022-10-26 DIAGNOSIS — I10 Essential (primary) hypertension: Secondary | ICD-10-CM

## 2022-11-26 ENCOUNTER — Telehealth: Payer: Self-pay | Admitting: Primary Care

## 2022-11-26 NOTE — Telephone Encounter (Signed)
Called and spoke with pharmacy, they have a fill of amlodipine on file and will get this ready for patient.

## 2022-11-26 NOTE — Telephone Encounter (Signed)
Prescription Request  11/26/2022  LOV: 07/31/2022  What is the name of the medication or equipment? amLODipine (NORVASC) 10 MG tablet   Have you contacted your pharmacy to request a refill? No   Which pharmacy would you like this sent to?   [id number] [place], [place] - [address] AT [facility] OF [address] [place] [address] Phone: [phone] Fax: [phone]    Patient notified that their request is being sent to the clinical staff for review and that they should receive a response within 2 business days.   Please advise at Mobile [phone] (mobile)

## 2023-02-15 ENCOUNTER — Other Ambulatory Visit: Payer: Self-pay

## 2023-02-15 DIAGNOSIS — I10 Essential (primary) hypertension: Secondary | ICD-10-CM

## 2023-02-16 MED ORDER — AMLODIPINE BESYLATE 10 MG PO TABS: 10.0000 mg | ORAL_TABLET | Freq: Every day | ORAL | 0 refills | Status: DC

## 2023-03-30 ENCOUNTER — Emergency Department (HOSPITAL_COMMUNITY)
Admission: EM | Admit: 2023-03-30 | Discharge: 2023-03-30 | Disposition: A | Discharge status: Home / Self Care | Payer: BLUE CROSS/BLUE SHIELD | Attending: Emergency Medicine | Admitting: Emergency Medicine

## 2023-03-30 DIAGNOSIS — R220 Localized swelling, mass and lump, head: Secondary | ICD-10-CM | POA: Insufficient documentation

## 2023-03-30 DIAGNOSIS — G8918 Other acute postprocedural pain: Secondary | ICD-10-CM | POA: Diagnosis not present

## 2023-03-30 NOTE — ED Triage Notes (Signed)
Patient reports having 8-9 teeth removed Thursday Continues to have swelling Pain reported 5/10 Says he isnt allowed to take narcotics due to rehab program he is currently in Says he cannot take ibuprofen due to previous bariatric surgery

## 2023-03-30 NOTE — ED Provider Notes (Signed)
[place] EMERGENCY DEPARTMENT AT [facility] Provider Note   CSN: [id number] Arrival date & time: 03/30/23  4098     History  Chief Complaint  Patient presents with   Facial Swelling    DAVONE SHINAULT is a 53 y.o. male.  Patient here with some postop pain and swelling.  He had about 9 or 10 teeth removed 2 days ago.  He has had bruising and swelling and some pain.  He is in recovery and is trying to avoid narcotics.  He has been on clindamycin.  He is concerned maybe for an allergic reaction but he denies any tongue or lip swelling no difficulty breathing.  Feels like bruising and swelling a little bit more than he would expect.  Denies any fevers chills.  The history is provided by the patient.       Home Medications Prior to Admission medications   Medication Sig Start Date End Date Taking? Authorizing Provider  acetaminophen (TYLENOL) 500 MG tablet Take 2 tablets (1,000 mg total) by mouth every 8 (eight) hours as needed for moderate pain. 07/05/22   Doreene Nest, NP  albuterol (VENTOLIN HFA) 108 (90 Base) MCG/ACT inhaler Inhale 2 puffs into the lungs every 6 (six) hours as needed for wheezing or shortness of breath. 07/05/22   Doreene Nest, NP  amLODipine (NORVASC) 10 MG tablet Take 1 tablet (10 mg total) by mouth daily. for blood pressure. 02/16/23   Doreene Nest, NP  cetirizine (ZYRTEC) 10 MG tablet Take 1 tablet (10 mg total) by mouth daily. For allergies 07/05/22   Doreene Nest, NP  diclofenac Sodium (VOLTAREN) 1 % GEL Apply 2 g topically 4 (four) times daily. 07/05/22   Doreene Nest, NP  famotidine (PEPCID) 20 MG tablet Take 1 tablet (20 mg total) by mouth daily. for heartburn. 07/05/22   Doreene Nest, NP  fluticasone (FLONASE) 50 MCG/ACT nasal spray Place 2 sprays into both nostrils daily. Patient not taking: Reported on 06/16/2021 03/03/20   Tower, Audrie Gallus, MD  fluticasone-salmeterol (ADVAIR) 250-50 MCG/ACT AEPB Inhale 1 puff into  the lungs in the morning and at bedtime. 07/31/22   Doreene Nest, NP      Allergies    Bee venom, Codeine, Hornet venom, and Penicillins    Review of Systems   Review of Systems  Physical Exam Updated Vital Signs BP (!) 160/103 (BP Location: Right Arm)   Pulse 88   Temp 98.2 F (36.8 C) (Oral)   Resp 18   Ht 6\' 1"  (1.854 m)   Wt (!) 158.8 kg   SpO2 97%   BMI 46.18 kg/m  Physical Exam Vitals and nursing note reviewed.  Constitutional:      General: He is not in acute distress.    Appearance: He is well-developed.  HENT:     Head: Normocephalic and atraumatic.     Nose: Nose normal.     Mouth/Throat:     Comments: Stitches from his teeth appear intact, no purulent drainage or major swelling of the lips tongue or gumline where teeth were removed Eyes:     Extraocular Movements: Extraocular movements intact.     Conjunctiva/sclera: Conjunctivae normal.     Pupils: Pupils are equal, round, and reactive to light.  Cardiovascular:     Rate and Rhythm: Normal rate and regular rhythm.     Heart sounds: No murmur heard. Pulmonary:     Effort: Pulmonary effort is normal. No respiratory distress.  Breath sounds: Normal breath sounds.  Abdominal:     Palpations: Abdomen is soft.     Tenderness: There is no abdominal tenderness.  Musculoskeletal:        General: No swelling.     Cervical back: Neck supple.  Skin:    General: Skin is warm and dry.     Capillary Refill: Capillary refill takes less than 2 seconds.     Findings: Bruising present.     Comments: Scab bruising over the left side of the face/left upper gumline, some bruising underneath the eyelids bilaterally  Neurological:     Mental Status: He is alert.  Psychiatric:        Mood and Affect: Mood normal.     ED Results / Procedures / Treatments   Labs (all labs ordered are listed, but only abnormal results are displayed) Labs Reviewed - No data to display  EKG None  Radiology No results  found.  Procedures Procedures    Medications Ordered in ED Medications - No data to display  ED Course/ Medical Decision Making/ A&P                                 Medical Decision Making  SABASTIAN RAIMONDI is here with postop pain and swelling.  Normal vitals.  No fever.  Overall no concern for allergic reaction or significant acute process.  He had about 10 teeth removed from his upper gumline 2 days ago.  He is got bruising and swelling over the left cheek with some bruising and swelling tracking to the lower eyelids which I suspect is normal postop pain and swelling.  There does not appear to be anything infectious on exam or allergic on exam.  These areas are consistent with bruising likely from having significant surgery done a couple days ago where he had all the teeth removed in his upper gumline.  He has been on clindamycin.  He is taking this antibiotic before in the past without any issues.  I think he can continue with Tylenol ice and rest for improvement.  He would like to avoid narcotics.  He cannot take any ibuprofen.  Overall I think he can call his surgeon if he has any further issues or return if things get worse as we discussed.  Discharged in good condition.  This chart was dictated using voice recognition software.  Despite best efforts to proofread,  errors can occur which can change the documentation meaning.         Final Clinical Impression(s) / ED Diagnoses Final diagnoses:  Facial swelling  Post-op pain    Rx / DC Orders ED Discharge Orders     None         Virgina Norfolk, DO 03/30/23 1025

## 2023-03-30 NOTE — Discharge Instructions (Signed)
Overall I do not think you are having an allergic reaction.  I think that this is expected postop swelling.  Continue your antibiotic continue Tylenol continue ice.  Please return if symptoms worsen as we discussed.  Follow-up with your surgeon as well.

## 2023-04-02 ENCOUNTER — Telehealth: Payer: Self-pay

## 2023-04-02 NOTE — Transitions of Care (Post Inpatient/ED Visit) (Signed)
Unable to reach patient by phone on 2 contact #s ;the v/m recordings did not list pts name but 2 other people. Did not leave v/m.     04/02/2023  Name: [name] [name] MRN: [id number] DOB: May 10, 1970  Today's TOC FU Call Status: Today's TOC FU Call Status:: Unsuccessful Call (1st Attempt) Unsuccessful Call (1st Attempt) Date: 04/02/23  Attempted to reach the patient regarding the most recent Inpatient/ED visit.  Follow Up Plan: Additional outreach attempts will be made to reach the patient to complete the Transitions of Care (Post Inpatient/ED visit) call.   Signature Lewanda Rife, LPN

## 2023-04-03 NOTE — Transitions of Care (Post Inpatient/ED Visit) (Signed)
Unable to reach pt by phone; did not leave v/m because the name left for the v/m was not the pt's name. Sending note to Allayne Gitelman NP      04/03/2023  Name: [name] [name] MRN: [id number] DOB: [birth year]  Today's TOC FU Call Status: Today's TOC FU Call Status:: Unsuccessful Call (2nd Attempt) Unsuccessful Call (1st Attempt) Date: 04/02/23 Unsuccessful Call (2nd Attempt) Date: 04/03/23  Attempted to reach the patient regarding the most recent Inpatient/ED visit.  Follow Up Plan: No further outreach attempts will be made at this time. We have been unable to contact the patient.  Signature Lewanda Rife, LPN

## 2023-04-04 NOTE — Telephone Encounter (Signed)
Noted.  Will await for patient to contact us.

## 2023-05-08 ENCOUNTER — Other Ambulatory Visit: Payer: Self-pay | Admitting: Primary Care

## 2023-05-08 DIAGNOSIS — I10 Essential (primary) hypertension: Secondary | ICD-10-CM

## 2023-05-08 NOTE — Telephone Encounter (Signed)
 Copied from CRM [id number]. Topic: Clinical - Medication Refill >> May 08, 2023  9:52 AM Josefa Half C wrote: Most Recent Primary Care Visit:  Provider: Doreene Nest  Department: [facility]  Visit Type: OFFICE VISIT  Date: 07/31/2022  Medication: amLODipine (NORVASC) 10 MG tablet  Has the patient contacted their pharmacy? No, patient initiated RX refill through the providers (Agent: If no, request that the patient contact the pharmacy for the refill. If patient does not wish to contact the pharmacy document the reason why and proceed with request.) (Agent: If yes, when and what did the pharmacy advise?)  Is this the correct pharmacy for this prescription? Yes If no, delete pharmacy and type the correct one.  This is the patient's preferred pharmacy:  [facility] #[id number] [place], [place] - [phone] AT [facility] OF [facility] & [address] [place] [address] Phone: [phone] Fax: [phone]   Has the prescription been filled recently? No  Is the patient out of the medication? No, patient has 7 pills left  Has the patient been seen for an appointment in the last year OR does the patient have an upcoming appointment? Yes  Can we respond through MyChart? No  Agent: Please be advised that Rx refills may take up to 3 business days. We ask that you follow-up with your pharmacy.

## 2023-05-09 MED ORDER — AMLODIPINE BESYLATE 10 MG PO TABS: 10.0000 mg | ORAL_TABLET | Freq: Every day | ORAL | 0 refills | Status: DC

## 2023-05-09 NOTE — Telephone Encounter (Signed)
 Patient is due for follow up in 2 months, this will be required prior to any further refills.  Please schedule, thank you!

## 2023-05-09 NOTE — Telephone Encounter (Signed)
 LVM for patient to c/b and schedule.

## 2023-05-10 NOTE — Telephone Encounter (Signed)
 Spoke to [employer], scheduled pt for 05/30/23

## 2023-05-30 ENCOUNTER — Ambulatory Visit (INDEPENDENT_AMBULATORY_CARE_PROVIDER_SITE_OTHER)
Admission: RE | Admit: 2023-05-30 | Discharge: 2023-05-30 | Disposition: A | Discharge status: Home / Self Care | Source: Ambulatory Visit | Attending: Primary Care | Admitting: Primary Care

## 2023-05-30 ENCOUNTER — Encounter: Payer: Self-pay | Admitting: Primary Care

## 2023-05-30 ENCOUNTER — Ambulatory Visit: Admitting: Primary Care

## 2023-05-30 VITALS — BP 136/78 | HR 93 | Temp 97.2°F | Ht 73.0 in | Wt 352.0 lb

## 2023-05-30 DIAGNOSIS — F411 Generalized anxiety disorder: Secondary | ICD-10-CM

## 2023-05-30 DIAGNOSIS — M25511 Pain in right shoulder: Secondary | ICD-10-CM

## 2023-05-30 DIAGNOSIS — F1011 Alcohol abuse, in remission: Secondary | ICD-10-CM | POA: Diagnosis not present

## 2023-05-30 DIAGNOSIS — I10 Essential (primary) hypertension: Secondary | ICD-10-CM

## 2023-05-30 DIAGNOSIS — J449 Chronic obstructive pulmonary disease, unspecified: Secondary | ICD-10-CM | POA: Diagnosis not present

## 2023-05-30 DIAGNOSIS — M25561 Pain in right knee: Secondary | ICD-10-CM

## 2023-05-30 DIAGNOSIS — G8929 Other chronic pain: Secondary | ICD-10-CM

## 2023-05-30 DIAGNOSIS — M25562 Pain in left knee: Secondary | ICD-10-CM

## 2023-05-30 MED ORDER — ALBUTEROL SULFATE HFA 108 (90 BASE) MCG/ACT IN AERS: 2.0000 | INHALATION_SPRAY | Freq: Four times a day (QID) | RESPIRATORY_TRACT | 0 refills | Status: AC | PRN

## 2023-05-30 MED ORDER — FLUTICASONE-SALMETEROL 250-50 MCG/ACT IN AEPB: 1.0000 | INHALATION_SPRAY | Freq: Two times a day (BID) | RESPIRATORY_TRACT | 5 refills | Status: AC

## 2023-05-30 MED ORDER — AMLODIPINE BESYLATE 10 MG PO TABS: 10.0000 mg | ORAL_TABLET | Freq: Every day | ORAL | 1 refills | Status: AC

## 2023-05-30 MED ORDER — DICLOFENAC SODIUM 1 % EX GEL: 2.0000 g | Freq: Four times a day (QID) | CUTANEOUS | 0 refills | Status: AC

## 2023-05-30 NOTE — Assessment & Plan Note (Signed)
 Uncontrolled, but manageable per patient. Will reevaluate once he has finished his detox program.

## 2023-05-30 NOTE — Assessment & Plan Note (Signed)
Commended him on sobriety. Encouraged to continue.

## 2023-05-30 NOTE — Patient Instructions (Addendum)
 Stop by the lab and xray prior to leaving today. I will notify you of your results once received.   Use the albuterol inhaler only if needed for shortness of breath.  Resume the Advair inhaler if you require use of the albuterol inhaler more than 3 times weekly on a consistent basis.  It was a pleasure to see you today!

## 2023-05-30 NOTE — Addendum Note (Signed)
 Addended by: Doreene Nest on: 05/30/2023 02:20 PM   Modules accepted: Orders

## 2023-05-30 NOTE — Assessment & Plan Note (Signed)
 Improved.  Continue albuterol inhaler as needed. We discussed to transition over to Advair inhaler twice daily if needed secondary to recurrent albuterol inhaler use.  Refills provided.

## 2023-05-30 NOTE — Assessment & Plan Note (Signed)
 Stable and improved.  Continue amlodipine 10 mg daily.

## 2023-05-30 NOTE — Assessment & Plan Note (Signed)
 Differentials include rotator cuff tear, bursitis, osteoarthritis.  Checking plain films of the right shoulder today. Consider MRI.  Await results.

## 2023-05-30 NOTE — Progress Notes (Signed)
 Subjective:    Patient ID: [name] [name], male    DOB: [birth year], 53 y.o.   MRN: [id number]  Shoulder Pain     [name] [name] is a very pleasant 53 y.o. male with a history of hypertension, OSA, COPD, GAD, tobacco use, alcohol abuse in remission who presents today for follow-up of chronic conditions. He would also like to discuss shoulder pain.  1) Hypertension: Currently managed on amlodipine 10 mg daily. He is one year sober, has been participating in a detox program since February 2024. He denies chest pain, dizziness, headaches.   BP Readings from Last 3 Encounters:  05/30/23 136/78  03/30/23 (!) 160/103  07/31/22 138/88   Wt Readings from Last 3 Encounters:  05/30/23 (!) 352 lb (159.7 kg)  03/30/23 (!) 350 lb (158.8 kg)  07/31/22 (!) 354 lb (160.6 kg)    2) COPD: Currently managed on Advair 250-50 MCG inhaler, 1 puff twice daily and albuterol inhaler as needed. He has not had his inhalers since October 2024. Without his inhaler he does notice exertional shortness of breath intermittently with moderate activity. Overall his breathing is better since he stopped smoking.   3) GAD: Chronic history.  Previously managed on Lexapro which caused suicidal ideation.  At the time he was consuming a lot of alcohol.  His anxiety has been uncontrolled since admission to detox facility in February 2024 as anxiety prescription drugs are not allowed. He has been taking ashwaganda which is helping some.   4) Shoulder Pain: Chronic to the entire right shoulder which began about 12 months ago. His pain is intermittent, but occurs with most any movement and while sleeping at night. His arm does feel weak with most movement and lifting anything.   He denies numbness/tingling, decrease in ROM/stiffness. He's been taking Tylenol which will sometimes help, but not much.     Review of Systems  Respiratory:  Negative for shortness of breath.   Cardiovascular:  Negative for chest pain.   Musculoskeletal:  Positive for arthralgias.  Neurological:  Negative for headaches.  Psychiatric/Behavioral:  The patient is nervous/anxious.          Past Medical History:  Diagnosis Date   Abdominal pain 07/31/2014   Abnormal CT scan, small bowel 12/28/2015   Acute bronchitis 04/09/2013   Acute conjunctivitis of both eyes 09/04/2017   Acute pharyngitis 04/13/2013   ADHD (attention deficit hyperactivity disorder) 10/21/2006   Qualifier: Diagnosis of  By: Jillyn Hidden FNP, Mcarthur Rossetti    AKI (acute kidney injury) (HCC) 06/26/2017   Alternating constipation and diarrhea 10/12/2015   ANGER 10/31/2006   Qualifier: Diagnosis of  By: Jillyn Hidden FNP, Mcarthur Rossetti    ANXIETY 11/14/2009   Qualifier: Diagnosis of  By: Dayton Martes MD, Talia     Asthma    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   Qualifier: Diagnosis of  By: Jillyn Hidden FNP, Billie-Lynn Daniels    Bronchitis    Calculus of gallbladder with acute cholecystitis without obstruction 02/15/2015   Chest pain 03/17/2018   Chest wall pain 02/10/2015   Cholelithiasis 02/13/2015   Chronic bronchitis (HCC)    Complication of anesthesia    usually wake up in the middle of procedure or surgery   Conjunctivitis 10/18/2010   COPD exacerbation (HCC) 10/13/2019   COPD with acute exacerbation (HCC) 06/26/2017   COVID-19 virus infection 03/27/2019   Dehydration 06/26/2017   DEPENDENT EDEMA 11/09/2008   Qualifier: Diagnosis of  By: Jillyn Hidden FNP, Mcarthur Rossetti    Diarrhea  11/06/2016   Enteritis 12/21/2015   Erectile dysfunction 04/11/2017   Gallstone 02/10/2015   Gallstones    GASTROENTERITIS WITHOUT DEHYDRATION 05/15/2010   Qualifier: Diagnosis of  By: Dayton Martes MD, Talia     GERD 10/21/2006   Qualifier: Diagnosis of  By: Jillyn Hidden FNP, Mcarthur Rossetti    GERD (gastroesophageal reflux disease)    Hepatic steatosis 05/31/2015   History of colonic polyps 12/28/2015   Hypertension    Left knee injury, initial encounter 06/17/2021    Lumbar back sprain 01/28/2017   Mole of skin 05/28/2012   Morbid obesity (HCC) 07/30/2006   Qualifier: Diagnosis of  By: Jillyn Hidden FNP, Mcarthur Rossetti    Nausea and vomiting 07/03/2019   Nausea vomiting and diarrhea 07/31/2014   Obesity    OSA (obstructive sleep apnea) 07/30/2006   Qualifier: Diagnosis of  By: Jillyn Hidden FNP, Mcarthur Rossetti    Partial small bowel obstruction (HCC) 12/14/2015   Right knee pain 12/01/2012   SBO (small bowel obstruction) (HCC) 06/26/2017   Skin lesion 11/07/2014   Skin tag 05/28/2012   Sleep apnea    "suppose to wear mask but I don't" (05/31/2015)   Small bowel obstruction (HCC) 05/31/2015   Small bowel obstruction, partial (HCC) 12/13/2015   Smoker 09/04/2017   Viral gastroenteritis 03/23/2016    Social History   Socioeconomic History   Marital status: Legally Separated    Spouse name: Not on file   Number of children: 1   Years of education: Not on file   Highest education level: Not on file  Occupational History   Occupation: Corporate treasurer    Employer: [employer]  Tobacco Use   Smoking status: Former    Current packs/day: 0.00    Average packs/day: 1 pack/day for 20.0 years (20.0 ttl pk-yrs)    Types: Cigarettes    Start date: 10/03/2016    Quit date: 10/03/2017    Years since quitting: 5.6   Smokeless tobacco: Current    Types: Snuff  Vaping Use   Vaping status: Never Used  Substance and Sexual Activity   Alcohol use: Yes    Alcohol/week: 13.0 standard drinks of alcohol    Types: 13 Shots of liquor per week    Comment: "a couple of shots a day"   Drug use: No   Sexual activity: Yes  Other Topics Concern   Not on file  Social History Narrative   Not on file   Social Drivers of Health   Financial Resource Strain: Not on file  Food Insecurity: Not on file  Transportation Needs: Not on file  Physical Activity: Not on file  Stress: Not on file  Social Connections: Unknown (05/05/2022)   Received from [facility],  [facility]   Social Network    Social Network: Not on file  Intimate Partner Violence: Unknown (05/05/2022)   Received from [facility], [facility]   HITS    Physically Hurt: Not on file    Insult or Talk Down To: Not on file    Threaten Physical Harm: Not on file    Scream or Curse: Not on file    Past Surgical History:  Procedure Laterality Date   CHOLECYSTECTOMY N/A 02/14/2015   Procedure: LAPAROSCOPIC CHOLECYSTECTOMY WITH INTRAOPERATIVE CHOLANGIOGRAM;  Surgeon: Abigail Miyamoto, MD;  Location: [facility] OR;  Service: General;  Laterality: N/A;   COLONOSCOPY  10/16/2005   Polyps (tubular adenoma 2007), external hemorrhoids   COLONOSCOPY WITH PROPOFOL N/A 01/05/2016   Procedure: COLONOSCOPY WITH PROPOFOL;  Surgeon:  Rachael Fee, MD;  Location: [facility];  Service: Endoscopy;  Laterality: N/A;   FINGER SURGERY Left    reconstructive - 2 surgeries on hand   LAPAROSCOPIC GASTRIC SLEEVE RESECTION  10/2010    at [facility] History  Problem Relation Age of Onset   Diabetes Mellitus II Mother    Breast cancer Mother    Ovarian cancer Mother    Diabetes Mellitus II Father    CAD Father    Stroke Father    Cancer Father        ?   Clotting disorder Father        blood clots in brain with surgery   Cirrhosis Maternal Grandmother    Breast cancer Maternal Grandmother     Allergies  Allergen Reactions   Bee Venom Anaphylaxis   Codeine Anaphylaxis    Tolerates dilaudid   Hornet Venom Anaphylaxis   Penicillins Hives and Other (See Comments)    Has patient had a PCN reaction causing immediate rash, facial/tongue/throat swelling, SOB or lightheadedness with hypotensionYES Has patient had a PCN reaction causing severe rash involving mucus membranes or skin necrosis: NO Has patient had a PCN reaction that required hospitalization NO Has patient had a PCN reaction occurring within the last 10 years: NO If all of the above answers are "NO", then may proceed with  Cephalosporin use.    Current Outpatient Medications on File Prior to Visit  Medication Sig Dispense Refill   acetaminophen (TYLENOL) 500 MG tablet Take 2 tablets (1,000 mg total) by mouth every 8 (eight) hours as needed for moderate pain. 180 tablet 0   cetirizine (ZYRTEC) 10 MG tablet Take 1 tablet (10 mg total) by mouth daily. For allergies (Patient not taking: Reported on 05/30/2023) 90 tablet 3   diclofenac Sodium (VOLTAREN) 1 % GEL Apply 2 g topically 4 (four) times daily. (Patient not taking: Reported on 05/30/2023) 200 g 0   famotidine (PEPCID) 20 MG tablet Take 1 tablet (20 mg total) by mouth daily. for heartburn. (Patient not taking: Reported on 05/30/2023) 90 tablet 0   fluticasone (FLONASE) 50 MCG/ACT nasal spray Place 2 sprays into both nostrils daily. (Patient not taking: Reported on 05/30/2023) 16 g 6   Current Facility-Administered Medications on File Prior to Visit  Medication Dose Route Frequency Provider Last Rate Last Admin   technetium tetrofosmin (TC-MYOVIEW) injection 32.6 millicurie  32.6 millicurie Intravenous Once PRN Nahser, Deloris Ping, MD        BP 136/78   Pulse 93   Temp (!) 97.2 F (36.2 C) (Temporal)   Ht 6\' 1"  (1.854 m)   Wt (!) 352 lb (159.7 kg)   SpO2 98%   BMI 46.44 kg/m  Objective:   Physical Exam Cardiovascular:     Rate and Rhythm: Normal rate and regular rhythm.  Pulmonary:     Effort: Pulmonary effort is normal.     Breath sounds: Normal breath sounds.  Musculoskeletal:     Cervical back: Neck supple.  Skin:    General: Skin is warm and dry.  Neurological:     Mental Status: He is alert and oriented to person, place, and time.  Psychiatric:        Mood and Affect: Mood normal.           Assessment & Plan:  Essential hypertension Assessment & Plan: Stable and improved.  Continue amlodipine 10 mg daily.   Orders: -     amLODIPine Besylate; Take 1  tablet (10 mg total) by mouth daily. for blood pressure.  Dispense: 90 tablet;  Refill: 1 -     Lipid panel -     Hemoglobin A1c -     Comprehensive metabolic panel with GFR -     CBC  Chronic obstructive pulmonary disease, unspecified COPD type (HCC) Assessment & Plan: Improved.  Continue albuterol inhaler as needed. We discussed to transition over to Advair inhaler twice daily if needed secondary to recurrent albuterol inhaler use.  Refills provided.  Orders: -     Albuterol Sulfate HFA; Inhale 2 puffs into the lungs every 6 (six) hours as needed for wheezing or shortness of breath.  Dispense: 6.7 g; Refill: 0 -     Fluticasone-Salmeterol; Inhale 1 puff into the lungs in the morning and at bedtime.  Dispense: 60 each; Refill: 5  Chronic right shoulder pain Assessment & Plan: Differentials include rotator cuff tear, bursitis, osteoarthritis.  Checking plain films of the right shoulder today. Consider MRI.  Await results.  Orders: -     DG Shoulder Right  Alcohol abuse, in remission Assessment & Plan: Commended him on sobriety! Encouraged to continue!   GAD (generalized anxiety disorder) Assessment & Plan: Uncontrolled, but manageable per patient. Will reevaluate once he has finished his detox program.         Doreene Nest, NP

## 2023-05-31 LAB — CBC
HCT: 44 % (ref 39.0–52.0)
Hemoglobin: 15.1 g/dL (ref 13.0–17.0)
MCHC: 34.4 g/dL (ref 30.0–36.0)
MCV: 88.7 fl (ref 78.0–100.0)
Platelets: 208 10*3/uL (ref 150.0–400.0)
RBC: 4.97 Mil/uL (ref 4.22–5.81)
RDW: 12.4 % (ref 11.5–15.5)
WBC: 5.9 10*3/uL (ref 4.0–10.5)

## 2023-05-31 LAB — LIPID PANEL
Cholesterol: 154 mg/dL (ref 0–200)
HDL: 50.1 mg/dL (ref >39.00)
LDL Cholesterol: 75 mg/dL (ref 0–99)
NonHDL: 103.51
Total CHOL/HDL Ratio: 3
Triglycerides: 142 mg/dL (ref 0.0–149.0)
VLDL: 28.4 mg/dL (ref 0.0–40.0)

## 2023-05-31 LAB — COMPREHENSIVE METABOLIC PANEL WITH GFR
ALT: 18 U/L (ref 0–53)
AST: 14 U/L (ref 0–37)
Albumin: 4.6 g/dL (ref 3.5–5.2)
Alkaline Phosphatase: 62 U/L (ref 39–117)
BUN: 15 mg/dL (ref 6–23)
CO2: 28 meq/L (ref 19–32)
Calcium: 9.1 mg/dL (ref 8.4–10.5)
Chloride: 101 meq/L (ref 96–112)
Creatinine, Ser: 0.89 mg/dL (ref 0.40–1.50)
GFR: 99.28 mL/min (ref >60.00)
Glucose, Bld: 98 mg/dL (ref 70–99)
Potassium: 3.9 meq/L (ref 3.5–5.1)
Sodium: 139 meq/L (ref 135–145)
Total Bilirubin: 0.4 mg/dL (ref 0.2–1.2)
Total Protein: 7 g/dL (ref 6.0–8.3)

## 2023-06-01 LAB — HEMOGLOBIN A1C: Hgb A1c MFr Bld: 5.2 % (ref 4.6–6.5)

## 2023-06-25 NOTE — Progress Notes (Unsigned)
     Elmin Wiederholt T. Reida Hem, MD, CAQ Sports Medicine [facility] [address] [place], [address]  Phone: [phone]  FAX: [phone]  [name] [name] - 53 y.o. male  MRN [id number]  Date of Birth: [birth year]  Date: 06/26/2023  PCP: Gabriel John, NP  Referral: Gabriel John, NP  No chief complaint on file.  Subjective:   [name] [name] is a 53 y.o. very pleasant male patient with There is no height or weight on file to calculate BMI. who presents with the following:  He is a very pleasant patient, who I remember well having seen him a number of times over the years.  He presents with predominant right-sided shoulder pain.  This has been active and an ongoing problem for about 1 year.  On plain x-ray, the patient has an essentially of fairly normal shoulder exam.  Glenohumeral joint is well-preserved.  There is some minor acromioclavicular arthritic change.    Review of Systems is noted in the HPI, as appropriate  Objective:   There were no vitals taken for this visit.  GEN: No acute distress; alert,appropriate. PULM: Breathing comfortably in no respiratory distress PSYCH: Normally interactive.   Laboratory and Imaging Data:  Assessment and Plan:   ***

## 2023-06-26 ENCOUNTER — Ambulatory Visit: Admitting: Family Medicine

## 2023-06-26 ENCOUNTER — Encounter: Payer: Self-pay | Admitting: Family Medicine

## 2023-06-26 VITALS — BP 130/90 | HR 69 | Temp 97.8°F | Ht 73.0 in | Wt 354.2 lb

## 2023-06-26 DIAGNOSIS — G8929 Other chronic pain: Secondary | ICD-10-CM | POA: Diagnosis not present

## 2023-06-26 DIAGNOSIS — M25511 Pain in right shoulder: Secondary | ICD-10-CM | POA: Diagnosis not present

## 2023-06-26 DIAGNOSIS — M7501 Adhesive capsulitis of right shoulder: Secondary | ICD-10-CM | POA: Diagnosis not present

## 2023-06-26 MED ORDER — TRIAMCINOLONE ACETONIDE 40 MG/ML IJ SUSP
40.0000 mg | Freq: Once | INTRAMUSCULAR | Status: AC
  Administered 2023-06-26: 40 mg via INTRA_ARTICULAR

## 2023-08-06 NOTE — Progress Notes (Deleted)
     Fortino Haag T. Rashidi Loh, MD, CAQ Sports Medicine [facility] [address] [place], [address]  Phone: [phone]  FAX: [phone]  CHRISS MANNAN - 53 y.o. male  MRN [id number]  Date of Birth: [birth year]  Date: 08/07/2023  PCP: Gabriel John, NP  Referral: Gabriel John, NP  No chief complaint on file.  Subjective:   NEDIM OKI is a 53 y.o. very pleasant male patient with There is no height or weight on file to calculate BMI. who presents with the following:  Mr. Hennes is a 53 year old patient who I recall well.  I saw him roughly 6 weeks ago with some chronic right shoulder pain.  At that point, he had restriction of motion in all planes of movement with the shoulder and felt like he was mostly having frozen shoulder.  At that time, the glenohumeral joint looks well-preserved on x-ray.  I started him on [facility] range of motion protocol, he is here to follow-up today.    Review of Systems is noted in the HPI, as appropriate  Objective:   There were no vitals taken for this visit.  GEN: No acute distress; alert,appropriate. PULM: Breathing comfortably in no respiratory distress PSYCH: Normally interactive.   Laboratory and Imaging Data:  Assessment and Plan:   ***

## 2023-08-07 ENCOUNTER — Ambulatory Visit: Admitting: Family Medicine

## 2023-08-07 DIAGNOSIS — M7501 Adhesive capsulitis of right shoulder: Secondary | ICD-10-CM

## 2023-08-07 DIAGNOSIS — G8929 Other chronic pain: Secondary | ICD-10-CM

## 2023-12-18 ENCOUNTER — Emergency Department
Admission: EM | Admit: 2023-12-18 | Discharge: 2023-12-18 | Disposition: A | Discharge status: Home / Self Care | Attending: Emergency Medicine | Admitting: Emergency Medicine

## 2023-12-18 ENCOUNTER — Other Ambulatory Visit: Payer: Self-pay

## 2023-12-18 DIAGNOSIS — M5441 Lumbago with sciatica, right side: Secondary | ICD-10-CM | POA: Diagnosis not present

## 2023-12-18 DIAGNOSIS — M545 Low back pain, unspecified: Secondary | ICD-10-CM | POA: Diagnosis present

## 2023-12-18 MED ORDER — KETOROLAC TROMETHAMINE 30 MG/ML IJ SOLN
30.0000 mg | Freq: Once | INTRAMUSCULAR | Status: AC
  Administered 2023-12-18: 30 mg via INTRAMUSCULAR
  Filled 2023-12-18: qty 1

## 2023-12-18 MED ORDER — LIDOCAINE 5 % EX PTCH
1.0000 | MEDICATED_PATCH | Freq: Once | CUTANEOUS | Status: DC
  Administered 2023-12-18: 1 via TRANSDERMAL
  Filled 2023-12-18: qty 1

## 2023-12-18 NOTE — ED Triage Notes (Signed)
 Pt arrives via POV. PT states he was lifting something heavy at work yesterday and injured his right lower back. Denies loss of control of bowel or bladder. Pt is AxOx4.

## 2023-12-18 NOTE — Discharge Instructions (Addendum)
 You are seen in the emergency department for lower back pain.  You were given a Lidoderm  patch, if this improves your pain you can get over-the-counter Lidoderm  patches which are 4%.  Leave on for 12 hours and then remove.:  Follow-up with your primary care physician to discuss whether you could take meloxicam/Celebrex given her history of gastric bypass and GI bleed.  You may need physical therapy.  No heavy lifting more than 5 to 10 pounds.  No twisting or bending.  Return to the emergency department for any worsening symptoms, weakness or urinary or bowel incontinence.  Pain control:  Acetaminophen  (tylenol ) - You can take 2 extra strength tablets (1000 mg) every 6 hours as needed for pain/fever.  Make sure you eat food/drink water when taking these medications.

## 2023-12-18 NOTE — ED Provider Notes (Signed)
 [facility] Provider Note    Event Date/Time   First MD Initiated Contact with Patient 12/18/23 1107     (approximate)   History   Back Pain   HPI  [name] [name] is a 53 y.o. male past medical history significant for prior gastric bypass, who presents to the emergency department for lower back pain.  Lifted a heavy motor out of a air conditioning unit yesterday and now having pain to his lower back.  Some radiation to the right side.  Denies any weakness.  No significant numbness.  No urinary or bowel incontinence.  No falls.  No IV drug use.  Currently taking 1500 mg of Tylenol .  No prior back surgery.  No history of cancer.     Physical Exam   Triage Vital Signs: ED Triage Vitals  Encounter Vitals Group     BP 12/18/23 1009 (!) 170/112     Girls Systolic BP Percentile --      Girls Diastolic BP Percentile --      Boys Systolic BP Percentile --      Boys Diastolic BP Percentile --      Pulse Rate 12/18/23 1009 78     Resp 12/18/23 1009 18     Temp 12/18/23 1009 98.7 F (37.1 C)     Temp src --      SpO2 12/18/23 1009 99 %     Weight --      Height --      Head Circumference --      Peak Flow --      Pain Score 12/18/23 1010 9     Pain Loc --      Pain Education --      Exclude from Growth Chart --     Most recent vital signs: Vitals:   12/18/23 1009  BP: (!) 170/112  Pulse: 78  Resp: 18  Temp: 98.7 F (37.1 C)  SpO2: 99%    Physical Exam Constitutional:      Appearance: He is well-developed.  HENT:     Head: Atraumatic.  Eyes:     Extraocular Movements: Extraocular movements intact.     Conjunctiva/sclera: Conjunctivae normal.     Pupils: Pupils are equal, round, and reactive to light.  Cardiovascular:     Rate and Rhythm: Regular rhythm.  Pulmonary:     Effort: No respiratory distress.  Abdominal:     Tenderness: There is no abdominal tenderness.  Musculoskeletal:     Cervical back: Normal range of motion.      Right lower leg: No edema.     Left lower leg: No edema.     Comments: Midline lumbar tenderness to palpation.  Mild tenderness to palpation to the right piriformis muscle.  No saddle anesthesia.  5/5 strength bilateral lower extremities.  Able to ambulate.  Skin:    General: Skin is warm.     Capillary Refill: Capillary refill takes less than 2 seconds.  Neurological:     Mental Status: He is alert. Mental status is at baseline.      IMPRESSION / MDM / ASSESSMENT AND PLAN / ED COURSE  I reviewed the triage vital signs and the nursing notes.  Differential diagnosis including musculoskeletal strain, sciatica, piriformis syndrome, disc herniation.  Clinic picture is not consistent with cauda equina or epidural compression syndrome.  Patient without any signs or symptoms of upper motor neuron, no urinary or bowel incontinence.  No weakness.  Do not  feel that emergent MRI is warranted at this time.  No red flags of back pain.  Labs (all labs ordered are listed, but only abnormal results are displayed) Labs interpreted as -    Labs Reviewed - No data to display    Patient given Lidoderm  patch.  Given ketorolac .  Discussed Tylenol .  Offered a short course of Percocet however patient has a history of alcohol addiction and would like to do not take any narcotics.  Just completed rehab and has been 20 months sober.  Discussed the correct dose of Tylenol .  Discussed Lidoderm  patches.  Discussed return precautions for any ongoing or worsening symptoms.  Discussed close follow-up with primary care physician.  Discussed multimodal pain management.   PROCEDURES:  Critical Care performed: No  Procedures  Patient's presentation is most consistent with acute complicated illness / injury requiring diagnostic workup.   MEDICATIONS ORDERED IN ED: Medications  lidocaine  (LIDODERM ) 5 % 1 patch (1 patch Transdermal Patch Applied 12/18/23 1154)  ketorolac  (TORADOL ) 30 MG/ML injection 30 mg (30 mg  Intramuscular Given 12/18/23 1154)    FINAL CLINICAL IMPRESSION(S) / ED DIAGNOSES   Final diagnoses:  Acute right-sided low back pain with right-sided sciatica     Rx / DC Orders   ED Discharge Orders     None        Note:  This document was prepared using Dragon voice recognition software and may include unintentional dictation errors.   Suzanne Kirsch, MD 12/18/23 1155
